# Patient Record
Sex: Male | Born: 1935 | Race: Black or African American | Hispanic: No | Marital: Married | Smoking: Former smoker
Health system: Southern US, Community
[De-identification: ages and names within clinical notes are randomized; demographics above are authoritative.]

## PROBLEM LIST (undated history)

## (undated) ENCOUNTER — Emergency Department (HOSPITAL_COMMUNITY): Admission: EM | Payer: PRIVATE HEALTH INSURANCE | Source: Home / Self Care

## (undated) DIAGNOSIS — R911 Solitary pulmonary nodule: Secondary | ICD-10-CM

## (undated) DIAGNOSIS — M5030 Other cervical disc degeneration, unspecified cervical region: Secondary | ICD-10-CM

## (undated) DIAGNOSIS — K922 Gastrointestinal hemorrhage, unspecified: Secondary | ICD-10-CM

## (undated) DIAGNOSIS — K5520 Angiodysplasia of colon without hemorrhage: Secondary | ICD-10-CM

## (undated) DIAGNOSIS — F419 Anxiety disorder, unspecified: Secondary | ICD-10-CM

## (undated) DIAGNOSIS — S2231XA Fracture of one rib, right side, initial encounter for closed fracture: Secondary | ICD-10-CM

## (undated) DIAGNOSIS — F1010 Alcohol abuse, uncomplicated: Secondary | ICD-10-CM

## (undated) DIAGNOSIS — D649 Anemia, unspecified: Secondary | ICD-10-CM

## (undated) DIAGNOSIS — J449 Chronic obstructive pulmonary disease, unspecified: Secondary | ICD-10-CM

## (undated) DIAGNOSIS — R51 Headache: Secondary | ICD-10-CM

## (undated) DIAGNOSIS — I10 Essential (primary) hypertension: Secondary | ICD-10-CM

## (undated) DIAGNOSIS — R519 Headache, unspecified: Secondary | ICD-10-CM

## (undated) HISTORY — DX: Anemia, unspecified: D64.9

## (undated) HISTORY — DX: Gastrointestinal hemorrhage, unspecified: K92.2

## (undated) HISTORY — DX: Solitary pulmonary nodule: R91.1

---

## 2001-10-15 ENCOUNTER — Encounter: Payer: Self-pay | Admitting: Internal Medicine

## 2001-10-15 ENCOUNTER — Inpatient Hospital Stay (HOSPITAL_COMMUNITY): Admission: EM | Admit: 2001-10-15 | Discharge: 2001-10-16 | Payer: Self-pay | Admitting: Emergency Medicine

## 2002-03-28 ENCOUNTER — Emergency Department (HOSPITAL_COMMUNITY): Admission: EM | Admit: 2002-03-28 | Discharge: 2002-03-28 | Payer: Self-pay | Admitting: Emergency Medicine

## 2002-03-28 ENCOUNTER — Encounter: Payer: Self-pay | Admitting: Emergency Medicine

## 2002-12-02 ENCOUNTER — Inpatient Hospital Stay (HOSPITAL_COMMUNITY): Admission: AD | Admit: 2002-12-02 | Discharge: 2002-12-05 | Payer: Self-pay | Admitting: General Surgery

## 2002-12-02 ENCOUNTER — Encounter: Payer: Self-pay | Admitting: General Surgery

## 2002-12-04 ENCOUNTER — Encounter: Payer: Self-pay | Admitting: General Surgery

## 2003-05-10 ENCOUNTER — Encounter: Payer: Self-pay | Admitting: Emergency Medicine

## 2003-05-10 ENCOUNTER — Emergency Department (HOSPITAL_COMMUNITY): Admission: EM | Admit: 2003-05-10 | Discharge: 2003-05-10 | Payer: Self-pay | Admitting: Emergency Medicine

## 2003-05-11 ENCOUNTER — Emergency Department (HOSPITAL_COMMUNITY): Admission: EM | Admit: 2003-05-11 | Discharge: 2003-05-11 | Payer: Self-pay | Admitting: Internal Medicine

## 2003-06-10 ENCOUNTER — Emergency Department (HOSPITAL_COMMUNITY): Admission: EM | Admit: 2003-06-10 | Discharge: 2003-06-10 | Payer: Self-pay | Admitting: Emergency Medicine

## 2003-06-10 ENCOUNTER — Encounter: Payer: Self-pay | Admitting: Emergency Medicine

## 2003-06-25 ENCOUNTER — Encounter (HOSPITAL_COMMUNITY): Admission: RE | Admit: 2003-06-25 | Discharge: 2003-07-25 | Payer: Self-pay | Admitting: General Surgery

## 2003-12-23 ENCOUNTER — Ambulatory Visit (HOSPITAL_COMMUNITY): Admission: RE | Admit: 2003-12-23 | Discharge: 2003-12-23 | Payer: Self-pay | Admitting: Internal Medicine

## 2004-01-21 ENCOUNTER — Inpatient Hospital Stay (HOSPITAL_COMMUNITY): Admission: EM | Admit: 2004-01-21 | Discharge: 2004-01-23 | Payer: Self-pay | Admitting: Emergency Medicine

## 2004-07-08 ENCOUNTER — Emergency Department (HOSPITAL_COMMUNITY): Admission: EM | Admit: 2004-07-08 | Discharge: 2004-07-08 | Payer: Self-pay

## 2005-07-19 ENCOUNTER — Emergency Department (HOSPITAL_COMMUNITY): Admission: EM | Admit: 2005-07-19 | Discharge: 2005-07-19 | Payer: Self-pay | Admitting: Emergency Medicine

## 2006-07-24 ENCOUNTER — Emergency Department (HOSPITAL_COMMUNITY): Admission: EM | Admit: 2006-07-24 | Discharge: 2006-07-24 | Payer: Self-pay | Admitting: Emergency Medicine

## 2006-10-17 ENCOUNTER — Emergency Department (HOSPITAL_COMMUNITY): Admission: EM | Admit: 2006-10-17 | Discharge: 2006-10-17 | Payer: Self-pay | Admitting: Emergency Medicine

## 2007-02-25 ENCOUNTER — Emergency Department (HOSPITAL_COMMUNITY): Admission: EM | Admit: 2007-02-25 | Discharge: 2007-02-25 | Payer: Self-pay | Admitting: Emergency Medicine

## 2007-11-04 ENCOUNTER — Emergency Department (HOSPITAL_COMMUNITY): Admission: EM | Admit: 2007-11-04 | Discharge: 2007-11-04 | Payer: Self-pay | Admitting: Emergency Medicine

## 2007-11-08 ENCOUNTER — Inpatient Hospital Stay (HOSPITAL_COMMUNITY): Admission: EM | Admit: 2007-11-08 | Discharge: 2007-11-09 | Payer: Self-pay | Admitting: Emergency Medicine

## 2007-11-09 ENCOUNTER — Ambulatory Visit: Payer: Self-pay | Admitting: Gastroenterology

## 2007-11-22 ENCOUNTER — Emergency Department (HOSPITAL_COMMUNITY): Admission: EM | Admit: 2007-11-22 | Discharge: 2007-11-22 | Payer: Self-pay | Admitting: Emergency Medicine

## 2008-04-17 ENCOUNTER — Ambulatory Visit (HOSPITAL_COMMUNITY): Payer: Self-pay | Admitting: Pulmonary Disease

## 2008-04-17 ENCOUNTER — Encounter (HOSPITAL_COMMUNITY): Admission: RE | Admit: 2008-04-17 | Discharge: 2008-05-17 | Payer: Self-pay | Admitting: Oncology

## 2008-06-14 ENCOUNTER — Emergency Department (HOSPITAL_COMMUNITY): Admission: EM | Admit: 2008-06-14 | Discharge: 2008-06-14 | Payer: Self-pay | Admitting: Emergency Medicine

## 2008-06-26 ENCOUNTER — Ambulatory Visit: Payer: Self-pay | Admitting: Gastroenterology

## 2008-07-01 HISTORY — PX: ESOPHAGOGASTRODUODENOSCOPY: SHX1529

## 2008-07-01 HISTORY — PX: COLONOSCOPY: SHX174

## 2008-07-04 ENCOUNTER — Ambulatory Visit (HOSPITAL_COMMUNITY): Admission: RE | Admit: 2008-07-04 | Discharge: 2008-07-04 | Payer: Self-pay | Admitting: Gastroenterology

## 2008-07-04 ENCOUNTER — Encounter: Payer: Self-pay | Admitting: Gastroenterology

## 2008-07-04 ENCOUNTER — Ambulatory Visit: Payer: Self-pay | Admitting: Gastroenterology

## 2008-08-11 ENCOUNTER — Ambulatory Visit (HOSPITAL_COMMUNITY): Payer: Self-pay | Admitting: Pulmonary Disease

## 2008-08-11 ENCOUNTER — Encounter (HOSPITAL_COMMUNITY): Admission: RE | Admit: 2008-08-11 | Discharge: 2008-09-10 | Payer: Self-pay | Admitting: Oncology

## 2008-09-18 ENCOUNTER — Ambulatory Visit: Payer: Self-pay | Admitting: Gastroenterology

## 2010-01-05 ENCOUNTER — Encounter (HOSPITAL_COMMUNITY): Admission: RE | Admit: 2010-01-05 | Discharge: 2010-02-04 | Payer: Self-pay | Admitting: Pulmonary Disease

## 2010-01-05 ENCOUNTER — Ambulatory Visit (HOSPITAL_COMMUNITY): Payer: Self-pay | Admitting: Pulmonary Disease

## 2010-03-12 ENCOUNTER — Emergency Department (HOSPITAL_COMMUNITY): Admission: EM | Admit: 2010-03-12 | Discharge: 2010-03-12 | Payer: Self-pay | Admitting: Emergency Medicine

## 2010-04-02 ENCOUNTER — Emergency Department (HOSPITAL_COMMUNITY): Admission: EM | Admit: 2010-04-02 | Discharge: 2010-04-03 | Payer: Self-pay | Admitting: Emergency Medicine

## 2010-05-16 ENCOUNTER — Emergency Department (HOSPITAL_COMMUNITY): Admission: EM | Admit: 2010-05-16 | Discharge: 2010-05-16 | Payer: Self-pay | Admitting: Emergency Medicine

## 2010-09-05 ENCOUNTER — Inpatient Hospital Stay (HOSPITAL_COMMUNITY): Admission: EM | Admit: 2010-09-05 | Discharge: 2010-09-05 | Payer: Self-pay | Admitting: Emergency Medicine

## 2010-10-10 ENCOUNTER — Emergency Department (HOSPITAL_COMMUNITY)
Admission: EM | Admit: 2010-10-10 | Discharge: 2010-10-10 | Payer: Self-pay | Source: Home / Self Care | Admitting: Emergency Medicine

## 2011-01-11 LAB — BASIC METABOLIC PANEL
BUN: 8 mg/dL (ref 6–23)
CO2: 25 mEq/L (ref 19–32)
Calcium: 8.5 mg/dL (ref 8.4–10.5)
Chloride: 96 mEq/L (ref 96–112)
GFR calc Af Amer: >60 mL/min (ref >60)
GFR calc non Af Amer: >60 mL/min (ref >60)
Potassium: 3.5 mEq/L (ref 3.5–5.1)
Sodium: 137 mEq/L (ref 135–145)

## 2011-01-11 LAB — DIFFERENTIAL
Band Neutrophils: 0 % (ref 0–10)
Basophils Absolute: 0 10*3/uL (ref 0.0–0.1)
Basophils Relative: 1 % (ref 0–1)
Eosinophils Absolute: 0 10*3/uL (ref 0.0–0.7)
Lymphs Abs: 0.6 10*3/uL — ABNORMAL LOW (ref 0.7–4.0)
Metamyelocytes Relative: 0 %
Monocytes Absolute: 0.6 10*3/uL (ref 0.1–1.0)
Myelocytes: 0 %
nRBC: 0 /100 WBC

## 2011-01-11 LAB — HEMOGLOBIN AND HEMATOCRIT, BLOOD
HCT: 28.5 % — ABNORMAL LOW (ref 39.0–52.0)
Hemoglobin: 8.9 g/dL — ABNORMAL LOW (ref 13.0–17.0)
Hemoglobin: 9.1 g/dL — ABNORMAL LOW (ref 13.0–17.0)

## 2011-01-11 LAB — CBC
HCT: 28.4 % — ABNORMAL LOW (ref 39.0–52.0)
Hemoglobin: 9 g/dL — ABNORMAL LOW (ref 13.0–17.0)
Platelets: 470 10*3/uL — ABNORMAL HIGH (ref 150–400)
WBC: 3.8 10*3/uL — ABNORMAL LOW (ref 4.0–10.5)

## 2011-01-18 LAB — CBC
Hemoglobin: 9.2 g/dL — ABNORMAL LOW (ref 13.0–17.0)
MCHC: 32.1 g/dL (ref 30.0–36.0)
Platelets: 424 10*3/uL — ABNORMAL HIGH (ref 150–400)

## 2011-01-18 LAB — URINALYSIS, ROUTINE W REFLEX MICROSCOPIC
Protein, ur: NEGATIVE mg/dL
pH: 5.5 (ref 5.0–8.0)

## 2011-01-18 LAB — BASIC METABOLIC PANEL
BUN: 9 mg/dL (ref 6–23)
CO2: 26 mEq/L (ref 19–32)
Calcium: 9.8 mg/dL (ref 8.4–10.5)
Creatinine, Ser: 0.91 mg/dL (ref 0.4–1.5)
GFR calc Af Amer: >60 mL/min (ref >60)
Sodium: 139 mEq/L (ref 135–145)

## 2011-01-23 ENCOUNTER — Emergency Department (HOSPITAL_COMMUNITY): Payer: Medicare Other

## 2011-01-23 ENCOUNTER — Emergency Department (HOSPITAL_COMMUNITY)
Admission: EM | Admit: 2011-01-23 | Discharge: 2011-01-23 | Disposition: A | Discharge status: Home / Self Care | Payer: Medicare Other | Attending: Emergency Medicine | Admitting: Emergency Medicine

## 2011-01-23 DIAGNOSIS — D649 Anemia, unspecified: Secondary | ICD-10-CM | POA: Insufficient documentation

## 2011-01-23 DIAGNOSIS — R000 Tachycardia, unspecified: Secondary | ICD-10-CM | POA: Insufficient documentation

## 2011-01-23 LAB — BASIC METABOLIC PANEL
BUN: 12 mg/dL (ref 6–23)
CO2: 26 mEq/L (ref 19–32)
Calcium: 9.4 mg/dL (ref 8.4–10.5)
Chloride: 100 mEq/L (ref 96–112)
Creatinine, Ser: 1.04 mg/dL (ref 0.4–1.5)
Glucose, Bld: 103 mg/dL — ABNORMAL HIGH (ref 70–99)
Potassium: 3.6 mEq/L (ref 3.5–5.1)

## 2011-01-23 LAB — DIFFERENTIAL
Eosinophils Absolute: 0.1 10*3/uL (ref 0.0–0.7)
Lymphs Abs: 0.6 10*3/uL — ABNORMAL LOW (ref 0.7–4.0)
Neutro Abs: 2 10*3/uL (ref 1.7–7.7)
Neutrophils Relative %: 60 % (ref 43–77)

## 2011-01-23 LAB — CBC
Hemoglobin: 8.7 g/dL — ABNORMAL LOW (ref 13.0–17.0)
MCH: 21.6 pg — ABNORMAL LOW (ref 26.0–34.0)
MCHC: 29.6 g/dL — ABNORMAL LOW (ref 30.0–36.0)
Platelets: 339 10*3/uL (ref 150–400)
RDW: 18.7 % — ABNORMAL HIGH (ref 11.5–15.5)

## 2011-01-23 LAB — HEMOGLOBIN AND HEMATOCRIT, BLOOD
HCT: 19.2 % — ABNORMAL LOW (ref 39.0–52.0)
HCT: 30 % — ABNORMAL LOW (ref 39.0–52.0)

## 2011-01-23 LAB — CROSSMATCH
ABO/RH(D): O POS
Antibody Screen: NEGATIVE

## 2011-01-23 LAB — POCT CARDIAC MARKERS
CKMB, poc: 5 ng/mL (ref 1.0–8.0)
Myoglobin, poc: 223 ng/mL (ref 12–200)
Troponin i, poc: <0.05 ng/mL (ref 0.00–0.09)
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
## 2011-03-15 NOTE — Consult Note (Signed)
NAME:  [name], [name]              ACCOUNT NO.:  [id number]   MEDICAL RECORD NO.:  [id number]          PATIENT TYPE:  AMB   LOCATION:  DAY                           FACILITY:  [facility]   PHYSICIAN:  Kassie Mends, M.D.      DATE OF BIRTH:  12-05-1929   DATE OF CONSULTATION:  06/27/2008  DATE OF DISCHARGE:                                 CONSULTATION   DATE OF SERVICE:  June 27, 2008.   PROBLEM LIST:  1. Iron deficiency anemia with a hemoglobin of 3.5 in January 2009.      The patient left against medical advice.  2. History of alcohol abuse.  3. Schizoaffective disorder.  4. Hypertension.  5. Headaches.  6. Anxiety  7. History of gastrointestinal bleed in 2004 and 2005 with      esophagogastroduodenoscopy and colonoscopy finding no lesion to      explain his anemia.  He also had a capsule endoscopy, which      suggested angiodysplasia.   SUBJECTIVE:  [name] is a 77 year old male who was last seen in  January 2009.  He says he has quit drinking liquor and he say he stopped  drinking beer a while back.  He was drinking alcohol to help him sleep.  He is now taking Xanax to help him sleep.  He says he is taking an iron  pill.  He denies any bright red blood per rectum or problem of  swallowing.  He is complaining of itching in his rectal area.  He also  complained of rash on the left chest.   ALLERGIES:  No known drug allergies.   MEDICATIONS:  1. Xanax 0.5 mg t.i.d.  2. Ventolin inhaler  3. Iron.   OBJECTIVE:   PHYSICAL EXAMINATION:  VITAL SIGNS:  Weight 134 pounds (up 17-1/2 pound  since January 2009), height 5 feet 4 inches, BMI 23.2 (healthy),  temperature 97.8, blood pressure 102/68, and pulse 84.GENERAL:  He is in  no apparent distress.  Alert and oriented x4.HEENT:  Atraumatic and  normocephalic.  Pupils are equal and reactive to light.  Mouth and oral  lesions.  Posterior pharynx without erythema or exudate.NECK:  Full  range of motion.  No lymphadenopathy.LUNGS:   Clear to auscultation  bilaterally.CARDIOVASCULAR:  Regular rhythm.  No murmur.  Normal S1 and  S2.ABDOMEN:  Bowel sounds are present, soft, nontender, nondistended.  No rebound or guarding.EXTREMITIES:  No cyanosis or edema.NEURO:  Has no  focal neurologic deficits.SKIN:  He has a macular rashes hyperpigmented  on his left chest.   ASSESSMENT:  [name] is a 77 year old male with iron deficiency  anemia.  His ferritin was measured at 6 in June 2009.  He also had a  hemoglobin of 6.2 in June 2009.  The differential diagnosis includes,  small-bowel arteriovenous malformations, and a low likelihood of gastric  malignancy, or colorectal cancer.  His rash is likely a fungal  infection.  He also has probable rectal itching from hemorrhoids.  Thank  you for allowing me to see [name] in consultation.  My  recommendations are as follow.  RECOMMENDATIONS:  1. He is given a discharge instruction in writing for the management      of his rectal itching, rash on his chest and his anemia.  He is to      begin Proctocort Cream to his rectum 4 times a day for 10 days.  He      should avoid hard stools.  2. I suggest miconazole with counter 4 times a day q.h.s. for 14 days.      If his rashes not resolved, he should see Dr. Juanetta Gosling.  3. He will have a colonoscopy followed by an EGD in next week.  4. Followup visit to see me in 3 months.      Kassie Mends, M.D.  Electronically Signed     SM/MEDQ  D:  06/27/2008  T:  06/28/2008  Job:  [id number]   cc:   Ramon Dredge L. Juanetta Gosling, M.D.  Fax: [phone]
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
## 2011-03-15 NOTE — Op Note (Signed)
NAME:  [name], [name]              ACCOUNT NO.:  [id number]   MEDICAL RECORD NO.:  [id number]          PATIENT TYPE:  AMB   LOCATION:  DAY                           FACILITY:  [facility]   PHYSICIAN:  Kassie Mends, M.D.      DATE OF BIRTH:  Dec 08, 1929   DATE OF PROCEDURE:  07/04/2008  DATE OF DISCHARGE:                               OPERATIVE REPORT   REFERRING PHYSICIAN:  Edward L. Juanetta Gosling, MD   PROCEDURES:  1. Ileocolonoscopy with BICAP of ascending colon arteriovenous      malformations.  2. Esophagogastroduodenoscopy with cold forceps biopsy and BICAP to      gastric arteriovenous malformation.   INDICATION FOR EXAM:  Mr. Gartin is a 77 year old male who presents  with persistent microcytic anemia.  He has a history of taking Xanax and  alcohol use.   FINDINGS:  1. Few small AVMs seen in the ascending colon just distal to the IC      valve.  Both lesions ablated via BICAP (25 watts).  No active      bleeding was noted prior to or after the procedure.  Otherwise, no      polyps, masses, inflammatory changes, diverticular AVMs, or      diverticula seen.  2. Normal terminal ileum approximately 10 cm visualized.  3. Normal esophagus without evidence of Barrett mass, erosion,      ulceration, or stricture.  4. Small hiatal hernia.  A 1-2 mm AVM active oozing in the mid body of      the stomach.  The lesion was ablated with BICAP (25 watts).  5. Patchy erosions in the antrum.  Biopsies obtained via cold forceps      to evaluate for H. pylori gastritis.  6. Normal duodenal bulb and second portion of the duodenum.   DIAGNOSES:  1. Ascending colon arteriovenous malformations.  2. Small hemorrhoids.  3. Hiatal hernia.  4. Gastric arteriovenous malformation.  5. Mild gastritis.   RECOMMENDATIONS:  1. He should avoid alcohol.  No aspirin or anti-inflammatory drugs for      30 days.  No anticoagulation for 7 days.  2. He should follow a high-fiber diet.  He was given a handout on    hemorrhoids, gastritis, and a high-fiber diet.  3. He should continue his iron for at least 3 months.  4. He has a follow up appointment to see Dr. Cira Servant in 3 months      regarding his anemia.  5. Will assess the need for a PPI.   MEDICATIONS:  1. Demerol 50 mg IV.  2. Versed 4 mg IV.   PROCEDURE TECHNIQUE:  Physical exam was performed.  Informed consent was  obtained from the patient after explaining the benefits, risks, and  alternatives to the procedure.  The patient was connected to the monitor  and placed in a left lateral position.  Continuous oxygen was provided  by nasal cannula and IV medicine administered through an indwelling  cannula.  After administration of sedation and rectal exam, the  patient's rectum was intubated and the scope was advanced under direct  visualization to the distal terminal ileum.  The scope was removed  slowly by carefully examining the color, texture, anatomy, and integrity  of the mucosa on the way out.   After the colonoscopy, the patient's esophagus was intubated with a  diagnostic gastroscope and the scope was advanced under direct  visualization to the second portion of the duodenum.  The scope was  removed slowly by carefully examining the color, texture, anatomy, and  integrity of the mucosa on the way out.  The patient was recovered in  endoscopy and discharged home in satisfactory condition.   PATH:  Benign mucosa.      Kassie Mends, M.D.  Electronically Signed     SM/MEDQ  D:  07/04/2008  T:  07/05/2008  Job:  [id number]   cc:   Ramon Dredge L. Juanetta Gosling, M.D.  Fax: [phone]

## 2011-03-15 NOTE — Assessment & Plan Note (Signed)
NAMEKIMMY, [name]                 CHART#:  [id number]   DATE:  09/18/2008                       DOB:  12-31-1929   PROBLEM LIST:  1. Ascending colon AVMs and gastric AVMs ablated in September 2009.  2. Alcohol abuse.  3. Hiatal hernia.  4. Anxiety.   SUBJECTIVE:  [name] is a 77 year old male who presents as a return  patient visit.  He was seen in September 2009.  He was seen during his  endoscopy.  He was asked to continue iron and follow up with me in  regard to his anemia.  Since September 2009, he has had a hemoglobin of  6.6 recorded in October.  He received blood transfusions.  He was asked  to follow with me in regard to subsequent management for his GI disease.  He denies any bright red blood per rectum, vomiting blood, or black  stool that looks like tar.  He has had no nausea or vomiting.  He  stopped drinking liquor, but is using [employer] at nighttime to  help him sleep.  He states it works better than Xanax.  He also was  complaining of feeling shortness of breath when he walks.  He believes  his shortness of breath is related to his lungs primarily.   MEDICATIONS:  None.   OBJECTIVE:  VITAL SIGNS:  Weight 135 pounds, height 5 feet 4 inches,  temperature 97.4, blood pressure 100/68, and pulse 60.  GENERAL:  He is in no apparent distress.  Alert and interactive.  He  answers questions appropriately.  He does have limited understanding of  the management of his disease.  LUNGS:  Clear to auscultation bilaterally.  However, if he exhales you  can here audible end expiratory wheezes.  I did not appreciate those on  pulmonary exam.  CARDIOVASCULAR:  Regular rhythm.  No murmur.  ABDOMEN:  Bowel sounds are present, soft, nontender, and nondistended.  NEURO:  He has no focal or neurologic deficits.   ASSESSMENT:  [name] is a 77 year old male who has a transfusion-  dependent anemia likely secondary to gastrointestinal arteriovenous  malformations.   Thank you for allowing me to see [name] in  consultation.  My recommendations follow.   RECOMMENDATIONS:  1. I explained management options to [name] which include      continue oral iron, capsule endoscopy and in 3 months if he is      still requiring transfusions then hematology referral for      intravenous infusions, or continue oral therapy and follow up in 2      months and if he is still requiring transfusions, then capsule      endoscopy and hematology referral.  [name] has made up his      mind that he does not want any blood transfusions.  I tried to      explain to him that, that may mean that he could die if he does not      have enough blood in his system.  He also declined to have his      blood drawn today to see if his hemoglobin was back down to 6.6 as      a reason for his shortness of breath.  He declined to have capsule  endoscopy today.  2. He did agree to oral iron therapy and was given bifera samples (5      days supply) who is also asked to take Nu-Iron 150 mg twice a day.  3. He has a follow up appointment to see me in 2 months'.  I did      discuss the challenges of managing his disease with Dr. Juanetta Gosling.      We will work together to try to ensure that [name] remains on      oral iron.  [name] seemed to understand that taking oral iron      may allow him to avoid blood transfusions.       Kassie [name], M.D.  Electronically Signed     SM/MEDQ  D:  09/18/2008  T:  09/19/2008  Job:  [id number]   cc:   Ramon Dredge L. Juanetta Gosling, M.D.
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
## 2011-03-18 NOTE — Discharge Summary (Signed)
NAME:  [name], [name]                          ACCOUNT NO.:  [id number]   MEDICAL RECORD NO.:  [id number]                   PATIENT TYPE:  INP   LOCATION:  A220                                 FACILITY:  [facility]   PHYSICIAN:  Jerolyn Shin C. Katrinka Blazing, M.D.                DATE OF BIRTH:  30-Jul-1930   DATE OF ADMISSION:  12/02/2002  DATE OF DISCHARGE:  12/05/2002                                 DISCHARGE SUMMARY   DISCHARGE DIAGNOSES:  1. Gastrointestinal bleeding of uncertain etiology, treated, improved.  2. Severe anemia, stable, status post transfusion.  3. Acute gastritis of antrum and body of stomach.  4. Small non-bleeding hemorrhoids.  5. Chronic alcoholism.  6. Schizoaffective disorder.  7. Hypertension.  8. Chronic headaches.   DISPOSITION:  The patient is discharged home in stable condition.   DISCHARGE MEDICATIONS:  1. Xanax 0.5 mg t.i.d.  2. Ultram 50 mg q.i.d.  3. Lexapro 10 mg daily.  4. Zyprexa 10 mg daily.  5. Hemocyte Plus one daily.   FOLLOWUP:  The patient is scheduled to be seen in the office in one week.   SPECIAL PROCEDURES:  1. Esophagogastroduodenoscopy with biopsy.  2. Colonoscopy.   These were done on December 03, 2002.   SUMMARY:  [age]-year-old male admitted with severe anemia.  The patient  had been seen in followup for recurrent GI bleeding.  Stool guaiacs were  positive and hemoglobin was 7.2.  He had a complaint of feeling tired and  weak and extremely cold; he also had significant dyspnea on exertion.  The  patient had to be convinced to be admitted to the hospital.  Over the past  year, he has had recurrent episodes of rectal bleeding and multiple episodes  of melena.  He has been on iron therapy.  He was admitted because of  asymptomatic anemia and need for transfusion and the need for endoscopic  evaluation.   He has history of heavy use of aspirin for chronic headaches.  There is a  long history of alcohol abuse.  He has a schizoaffective  disorder.  Other  specifics are given in the admission note.   His stool on exam was strongly guaiac-positive.  The patient was admitted.  Because of his hemoglobin of 7.2, it was felt that we needed to transfuse  him to three units but the pathologist would only release two units of  blood; this is because of a blood shortage.  Nevertheless, the patient was  transfused two units of packed red cells and his post-transfusion hemoglobin  was 9.9.  On December 03, 2001, he underwent EGD and colonoscopy.  EGD showed  acute antral gastritis with acute antral erosions.  A colonoscopy also  revealed small internal hemorrhoids which were not bleeding.  Gastric  biopsies were taken x3; these only showed gastritis.  The patient did well  otherwise.  We elected to do a small-bowel follow-through  since upper and  lower endoscopies were normal.  Small-bowel follow-through was negative.  The patient had documented bradycardia during this hospitalization with  heart rate in a sinus pattern, varying between 48 and 52.  It was felt that  we did not need to address it at this time.   DICTATION ENDED AT THIS POINT.                                               Dirk Dress. Katrinka Blazing, M.D.    LCS/MEDQ  D:  12/16/2002  T:  12/16/2002  Job:  [id number]

## 2011-03-18 NOTE — Discharge Summary (Signed)
NAME:  [name], [name]                          ACCOUNT NO.:  [id number]   MEDICAL RECORD NO.:  [id number]                   PATIENT TYPE:  INP   LOCATION:  A220                                 FACILITY:  [facility]   PHYSICIAN:  Jerolyn Shin C. Katrinka Blazing, M.D.                DATE OF BIRTH:  June 14, 1930   DATE OF ADMISSION:  12/02/2002  DATE OF DISCHARGE:  12/05/2002                                 DISCHARGE SUMMARY   This is a redictation.   DISCHARGE DIAGNOSES:  1. Recurrent gastrointestinal bleeding, etiology undetermined.  2. Severe anemia secondary to number one.  3. Acute and chronic gastritis of antrum and body.  4. Small internal hemorrhoids.  5. Chronic alcoholism.  6. Schizoaffective disorder.  7. Hypertension.  8. Chronic headaches.  9. Asymptomatic sinus bradycardia.   SPECIAL PROCEDURE:  EGD with biopsy and colonoscopy done on February 3.   DISPOSITION:  The patient discharged home in stable, satisfactory condition  with plans for follow-up in the office in one week.   DISCHARGE MEDICATIONS:  1. Xanax 0.5 mg t.i.d.  2. Ultram 50 mg q.i.d.  3. Lexapro 10 mg daily.  4. Zyprexa 10 mg daily.  5. Hemocyte Plus one daily.   FOLLOW UP:  The patient is scheduled to be seen in the office in one week.   SUMMARY:  A 77 year old male with a history of severe anemia who has been  followed for recurrent GI bleeding.  The patient was noted to have heme-  positive stools and hemoglobin of 7.2.  He complained of feeling weak and  tired and was extremely cold.  He also had dyspnea on exertion.  He was  finally convinced that he needed to be admitted to the hospital.  The  patient has had multiple episodes of recurrent rectal bleeding in the past  and his hemoglobin has fluctuated from 8.2 to 11.  He had been on iron  therapy.   PAST MEDICAL HISTORY:  Past history reveals that he has a history of heavy  aspirin use for chronic headaches.  He also has schizoaffective disorder.   PHYSICAL  EXAMINATION:  RECTAL:  Unremarkable except for guaiac-positive  stool on rectal examination.   HOSPITAL COURSE:  The patient was admitted, typed and crossed, transfused 2  units of packed cells.  We wished to transfuse him 3 units but the  pathologist refused _______ of blood because of blood shortage.  Post  transfusion hemoglobin 9.9.  The patient underwent a bowel prep the evening  of admission and on the following morning he had EGD with biopsy and  colonoscopy.  This showed acute antral gastritis with erosions and small  internal hemorrhoids.  The patient tolerated procedure well.  Small bowel  follow through was done and this was negative.  The patient had bradycardia  which was noted during this hospitalization with heart rate about 48-52.  He  was asymptomatic  so this was not addressed any further.  He will be followed  closely as an outpatient.  The patient did very well and was discharged home  in satisfactory condition.                                               Dirk Dress. Katrinka Blazing, M.D.    LCS/MEDQ  D:  12/16/2002  T:  12/16/2002  Job:  [id number]

## 2011-03-18 NOTE — H&P (Signed)
NAME:  [name], [name]                          ACCOUNT NO.:  [id number]   MEDICAL RECORD NO.:  [id number]                   PATIENT TYPE:  INP   LOCATION:  A220                                 FACILITY:  [facility]   PHYSICIAN:  Jerolyn Shin C. Katrinka Blazing, M.D.                DATE OF BIRTH:  Dec 30, 1929   DATE OF ADMISSION:  12/02/2002  DATE OF DISCHARGE:                                HISTORY & PHYSICAL   HISTORY OF PRESENT ILLNESS:  A 77 year old male admitted with severe anemia.  The patient was seen in the office on January 29. He was seen in follow-up  of recurrent GI bleeding. He denied having any bleeding at that time but  states that he had some dark stools. A stool guaiac was positive and  hemoglobin was done. Hemoglobin was 7.2. The patient states that he has been  feeling tired and weak and that he has been extremely cold. He has had  significant exertion of dyspnea. He was finally convinced to be admitted to  the hospital. He has had two hospitalization in the past for anemia but we  have never been able to do diagnostic studies. His last hospitalization was  December 2002. At that time, he had a hemoglobin of 7. He was in the process  of having a bowel prep when he left the hospital AMA. He had been transfused  up to a hemoglobin of 12. Over the past year, he has had recurrent episodes  of rectal bleeding, multiple episodes of melena and a hemoglobin which has  fluctuated from 8.2 to 11. He has been on iron therapy but has not taken the  iron faithfully. The patient is admitted now because of symptomatic anemia.  Will try to transfuse him tonight and start his bowel prep with Dulcolax  tablets tonight and do an upper and lower endoscopy in the morning.   PAST MEDICAL HISTORY:  The patient has a history of heavy aspirin use for  headaches. He has had chronic headaches for many years. He also has a  history of alcohol abuse. Other problems include a schizo effective disorder  that has been  fairly well controlled on Lexapro and Zyprexa. He has been  compliant in taking his medications and has not had an acute exacerbation of  his personality disorder.   MEDICATIONS:  1. Xanax 0.5 mg t.i.d.  2. Ultram 50 mg q.i.d. for headaches.  3. Lexapro 10 mg daily.  4. Zyprexa 10 mg daily.   PAST SURGICAL HISTORY:  None given.   SOCIAL HISTORY:  He is retired, has a history of tobacco use and alcohol  abuse. Presently he does not smoke but he does chew tobacco.   PHYSICAL EXAMINATION:  GENERAL:  He is a small framed male who is quite  alert. He looks pale.  VITAL SIGNS:  Blood pressure 150/60, pulse 68, respirations 20, weight 149  pounds.  HEENT:  Unremarkable except that he is edentulous.  NECK:  Supple, no JVD or bruit.  CHEST:  Clear to auscultation, no rales, rubs, rhonchi or wheezes.  HEART:  Regular rate and rhythm without murmur, gallop or rub.  ABDOMEN:  Soft, nontender, no masses.  RECTAL:  No masses, stools strongly guaiac positive.  EXTREMITIES:  No cyanosis, clubbing or edema. No joint deformity.  NEUROLOGIC:  Alert and oriented x3. Cranial nerves II-XII are  intact. Gait  and stance are normal. There is no motor, sensory or cerebellar deficit.   IMPRESSION:  1. Recurrent gastrointestinal bleeding with severe anemia.  2. Chronic alcoholism.  3. Schizo effective disorder.  4. Hypertension.  5. Chronic headaches.  6. Heavy alcohol use.  7. Heavy aspirin abuse.   PLAN:  The patient will have stat evaluation of his hemoglobin aneurysm he  will be typed and crossed and transfused 2-3 units of packed red cells as  needed. Will start on his bowel prep tonight and he will have upper and  lower endoscopy in the morning. He will be started on Librium  prophylactically because of his history of alcohol abuse and he will be  continued on his baseline medications.                                               Dirk Dress. Katrinka Blazing, M.D.    LCS/MEDQ  D:  12/02/2002  T:   12/02/2002  Job:  [id number]

## 2011-03-18 NOTE — H&P (Signed)
NAMEJOSEL, KEO NO.:  [id number]   MEDICAL RECORD NO.:  [id number]                   PATIENT TYPE:  INP   LOCATION:  IC08                                 FACILITY:  [facility]   PHYSICIAN:  Annia Friendly. Loleta Chance, M.D.                DATE OF BIRTH:  08-14-1930   DATE OF ADMISSION:  01/20/2004  DATE OF DISCHARGE:                                HISTORY & PHYSICAL   The patient is a 77 year old black male who is separated and a retired Cabin crew. He resides in [place], [place].   CHIEF COMPLAINT:  Dizziness, nausea, vomiting and stomach pain.  The patient  has been experiencing symptoms over the past 24 hours.  The patient incurred  periumbilical pain on January 19, 2004.  The pain was intermittent and  described as an ache.  He incurred dizziness on the day of admission with  vomiting x3, the vomitus was described as black.  He denies syncope, chest  pain, shortness of breath, gross hematuria.   PAST MEDICAL HISTORY:  1. History of chronic gastritis by EGD.  2. Internal hemorrhoids.  3. Schizoaffective disorder.  4. Chronic anxiety.  5. The patient has been hospitalized several times pertaining GI blood loss.  6. Negative for known peptic ulcer disease, inflammatory bowel disease, or     colon cancer.  7. Negative for diabetes, tuberculosis, sickle cell, asthma, seizure     disorder.   MEDICATIONS:  1. Xanax 0.25 mg p.o. t.i.d.  2. Hydrocodone/APAP 5/ 500 p.o. q.i.d. p.r.n.  3. Temazepam 30 mg p.o. q.h.s. for sleep.  4. Iron supplement 65 mg p.o. daily.   ALLERGIES:  The patient is not allergic to any known medications.   HABITS:  Positive for current use of chewing tobacco. Former cigarette  smoker x3 years (smoked one pack every 3 days at time of smoking; started at  age 31).  The patient states that he has not consumed ethanol x2 months (at  time of use, ethanol 1 pint daily; started at age 51).  The patient denies  use of street drugs.   No history of sexually transmitted disease.  History  if negative for gonorrhea, syphilis, and human immunodeficiency virus  infection.   OTHER PAST MEDICAL HISTORY:  Positive for hospitalization for anemia  secondary to GI blood loss in February, 2004.  Hospitalization for chronic anemia secondary to blood loss with iron-  deficiency anemia in February, 2002.  The patient denies use of over-the-  counter nonsteroidal antiinflammatory agents.   REVIEW OF SYSTEMS:  Negative for epistaxis, bleeding gums, significant  weight loss, change in bowel habit, edema of legs, constipation, diarrhea,  hemoptysis, unexplained fever, dysphagia, etc.   FAMILY HISTORY:  Mother deceased secondary to natural causes; father  deceased secondary to suicide; 3 living sisters, one at 49 with history of  heart disease, 75 year old with history of polio, and an 75 year old  with  history of dementia; 2 sisters deceased secondary to complications of  stroke; son with a history of stroke; 3 living daughters, 1 age 37 with  history of asthma, age 43 with history of gastroesophageal reflux disease.   PHYSICAL EXAMINATION:  GENERAL:  Elderly, slightly small framed, slightly  short, alert black male in no apparent respiratory distress.  SKIN:  Warm and dry.  VITAL SIGNS:  At 1936 Hr. on January 20, 2004 temperature was 97.0, blood  pressure 82/65, pulse 103, respirations 24.  Vitals at 1948 Hr. on January 21, 2004:  Blood pressure 103/67, pulse 80, respirations 24.  HEENT:  Positive male pattern baldness.  Ears - external canal patent,  tympanic membranes pearly gray.  Eyes - lids negative with ptosis, sclerae  white, pupils round, equal and reactive to light.  Extraocular movements are  intact. Nose - negative discharge.  Mouth positive for upper and lower  dentures.  No oral lesions.  Posterior pharynx benign.  NECK:  Negative for lymphadenopathy or thyromegaly. Supraclavicular space  without palpable nodes.   LUNGS:  Clear.  HEART:  Audible S1 and S2 without murmur.  Regular rate and rhythm.  ABDOMEN:  No distention, hyperactive bowel sounds, soft, nontender in all 4  quadrants.  No palpable mass, no organomegaly.  EXTERNAL GENITALIA:  Normal male. No nodule on palpation of testicles.  RECTAL:  (By ER physician) was guaiac positive.  EXTREMITIES:  No edema, no joint swelling or joint redness, no joint  effusion.  NEUROLOGIC:  Alert and oriented to person, place and time.  Cranial nerves  II-XII appeared intact.   LABORATORY DATA:  White count 6.4 thousand, hemoglobin 8.7, hematocrit 28.2,  platelets 444,000.  Albumin 3.2, SGOT 38, SGPT 41, alkaline phosphatase 7,  total bilirubin 0.5, sodium 137, potassium 4.3, chloride 104, CO2 28,  glucose 128, BUN 39, creatinine 0.9, calcium 8.9, total CPK 66, CK-MB 1.4,  troponin-I of 0.01.   EKG demonstrated a normal sinus rhythm, no ST elevation or depression, Q  wave in lead V1 only.   PRIMARY DIAGNOSIS:  Anemia secondary to gastrointestinal blood loss.   SECONDARY DIAGNOSES:  1. Internal hemorrhoids.  2. Schizoaffective disorder.  3. History of chronic gastritis by esophagogastroduodenoscopy on December 03, 2002.  4. Chronic anxiety.   PLAN:  Intravenous normal saline at 100 cc/Hr.  Oxygen at 2 liters/minute  via nasal cannula.  Activity will be bed rest.  Bedside commode.  Xanax 0.25 mg p.o. b.i.d.,  Protonix 40 mg IV daily.  Case worker consult _____.  Type and cross for 2  units of packed red blood cells and transfuse for hematocrit less than 26.  Repeat CBC q.12h x2 then in 24 hours.  CEA level in a.m.     ___________________________________________                                         Annia Friendly. Loleta Chance, M.D.   Levonne Hubert  D:  01/21/2004  T:  01/21/2004  Job:  [id number]

## 2011-03-18 NOTE — Discharge Summary (Signed)
[name], [name] NO.:  [id number]   MEDICAL RECORD NO.:  [id number]                   PATIENT TYPE:  INP   LOCATION:  A213                                 FACILITY:  [facility]   PHYSICIAN:  Annia Friendly. Loleta Chance, M.D.                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.  Chief complaints  were dizziness, nausea, vomiting and stomach pain.  The patient had been  experiencing symptoms over the past 24 hours.  He also experienced  periumbilical pain on January 19, 2004.  The pain was intermittent and  described as an ache.  He vomited x3 on the day of admission.  Vomiting was  described as black.  He denied syncope, chest pain, shortness of breath or  gross hematuria.   PAST MEDICAL HISTORY:  Positive for chronic gastritis by EGD, internal  hemorrhoids, schizoaffective disorder, chronic anxiety.   PAST MEDICAL HISTORY:  Past hospitalization for GI blood loss.   PAST MEDICAL HISTORY:  Negative for known peptic ulcer disease, inflammatory  bowel disease and colon cancer.   ALLERGIES:  The patient is not allergic to any known medication.   SOCIAL HISTORY:  Habits were positive for current use of chewing tobacco,  former cigarette smoker x3 years, apparently smoked one pack every three  days at time of smoking (started at age 27).  Positive for former use of  alcohol x2 months (at time of use, ethanol, one pint daily; starting age  86).  The patient denies excessive use of over-the-counter antiinflammatory  agents.   PROBLEMS LIST:  Chronic anemia secondary to GI blood loss due to vascular  abnormality in the cecum and ascending colon.   GENERAL APPEARANCE:  GENERAL:  An elderly, slight small-framed, alert black  male, in no  apparent respiratory distress.  VITAL SIGNS:  At 1936 in the emergency room, temperature 97.0, blood  pressure 82/65, pulse 103.  Respirations 24.  HEENT:  Positive for male-pattern baldness.  Sclerae white.  Conjunctivae  pink.  LUNGS:  Clear.  HEART:  S1, S2, without murmur, regular rate and rhythm.  ABDOMEN:  Slightly obese, hyperactive bowel sounds, soft, nontender all four  quadrants.  RECTAL:  No external lesion.  Digital exam by ER physician was guaiac  positive.   LABORATORY DATA:  Significant labs on admission:  White count 6.4,  hemoglobin 8.7, hematocrit 20.2, platelets 444,000.   HOSPITAL COURSE:  The patient was admitted to telemetry.  IV fluids and  normal saline at 100 cc per hour, oxygen at two liters per minute via nasal  cannula.  Type and cross two units of packed red blood cells and keep her  whole and  transfuse if hematocrit less than 26.  Order repeat CBC every 12  hours x2.  Protonix 40 mg IV daily.  Activity bedrest.  RBC tagged scan.  Clear liquid diet.  Other supportive measures.  RBC scan on January 21, 2004,  was read as negative.  GI bleeding scan with no evidence of active site of  bleeding during the dynamic scan as ready by Dr. __________ .  The patient  was given two unit of packed red blood cells on January 21, 2004, because of  hemoglobin of 7.7, hematocrit 24.2.  Post transfusion hemoglobin was 9.6,  and hematocrit was 29.4.  The patient had a diagnostic colonoscopy of January 23, 2004, performed by Dr. Katrinka Blazing revealing vascular abnormality in the cecum  and the ascending colon.  There were multiple medium collections of small  vessels in the cecum and ascending colon which showed oozing in the visible  vessels.   Colonoscopy otherwise normal.  EGD on January 23, 2004, revealed the  following:  1. Hypopharynx appeared normal.  2. The esophagus appeared normal.  3. The gastroesophageal junction appeared normal.  4. Acute gastritis of the antrum, the body  of the stomach and the fundus.     Multiple random cold biopsies were obtained from the antrum and the body     of the stomach.  5. The pylorus appeared normal.  6. The duodenum appeared normal.  7. The patient's hemoglobin on June 24, 2004, at 0615 was 8.8, and     hematocrit 26.9.   PROBLEMS:  Problem #1.  Dr. Katrinka Blazing, surgeon, discussed with the patient and  his wife the need for removal of  rectalectomy to stop the chronic anemia.  The patient and his wife appeared to understand, and they were in agreement  for the procedure to be done by Dr. Elpidio Anis.  The patient left against  medical advice on January 23, 2004.  He was alert and oriented to person,  place and time at the time of leaving against medical advice.  He was not  complaining of shortness of breath, chest pain or dizziness.  An effort will  be made to contact the patient to come to the office for repeat hemoglobin  and hematocrit on January 26, 2004.  He will again be reminded to avoid  alcoholic beverages, aspirin like products and any agents that is irritating  to the stomach.   Problem #2.  Schizoaffective disorder.  The patient will be observed.   Problem #3.  Chronic anxiety.  The patient was treated with Xanax 0.25 mg  p.o. b.i.d.  He remained alert and oriented to person, place and time  throughout this hospitalization.  He denies ________ tactile or ________  hallucinations.   Last recorded systolics were greater than 90.   DISCHARGE INSTRUCTIONS:  None.   MEDICATIONS:  1. The patient was advised to continue Xanax 0.25 mg p.o. b.i.d.  2. Iron supplement, 65 mg p.o. daily.  3. Prilosec 20 mg p.o. daily.  4. Temazepam 50 mg p.o. q. bedtime for sleep.  5. Activity as tolerated.   PRIMARY DIAGNOSES:  1. Anemia secondary to lower gastrointestinal bleeding due to vascular     abnormality in the cecum and ascending colon.  2. Gastritis of the antrum, body of the stomach, and the fundus.  SECONDARY  DIAGNOSES:  1. Schizoaffective disorder.  2. Chronic anxiety.     ___________________________________________  Annia Friendly. Loleta Chance, M.D.   Levonne Hubert  D:  01/24/2004  T:  01/25/2004  Job:  [id number]

## 2011-03-18 NOTE — Discharge Summary (Signed)
[facility]  Patient:    [name], [name] Visit Number: [id number] MRN: [id number]          Service Type: MED Location: 2A A223 01 Attending Physician:  Elliot Cousin Dictated by:   Elliot Cousin, M.D. Admit Date:  10/15/2001 Discharge Date: 10/16/2001                             Discharge Summary  DISCHARGE DIAGNOSES:  1. PATIENT LEFT AGAINST MEDICAL ADVICE (AMA).  2. Iron deficiency anemia probably secondary to a subacute gastrointestinal     bleed.     a. Iron studies revealed a ferritin of 14, total iron of 122, vitamin B12        of 331, RBC folate pending.  3. Thrombocytosis secondary to anemia.  4. Agitation probably secondary to early alcohol withdrawal.  5. History of chronic alcoholism.  6. Hypertension.  7. History of personality disorder (per primary care physician, Dr. Katrinka Blazing).  8. Bradycardia.  9. Chronic anxiety. 10. Transient chest pain.  SECONDARY DISCHARGE DIAGNOSES: 1. History of chewing tobacco. 2. History of chest pain October 1999.  Cardiolite was negative.  DISCHARGE MEDICATIONS: 1. Norvasc 10 mg q.d. 2. Hemocyte Plus one q.d. 3. Xanax 0.5 mg b.i.d. p.r.n.  DISCHARGE DISPOSITION:  The patient became agitated and was complaining about not having anything to eat.  He therefore left against medical advice.  He apparently was picked up from the hospital by one of his sons.  He was in stable condition prior to hospital discharge.  He was alert and oriented and fully competent and aware of his decision.  The patient was advised to call the office to make an appointment to see Dr. Katrinka Blazing within the next week.  CONSULTATIONS:  Elpidio Anis, M.D.  PROCEDURES:  A 2-D echocardiogram.  The results revealed mild left atrial enlargement, ejection fraction 60%, no evidence of left ventricular hypertrophy, no important valve abnormalities, normal right-sided structures and function, no pericardial effusion.  HISTORY OF PRESENT  ILLNESS:  [name] is a 77 year old African-American man who presented to the emergency room the early morning of October 15, 2001 with a one day history of an inability to "belch," indigestion, nervous stomach, nervous legs, and transient left-sided chest pain which lasted for a few seconds, then resolved.  The patient says that the chest pain was not associated with diaphoresis, nausea, vomiting, radiation, or pleurisy.  He did have one episode of episodic dyspnea on exertion.  He said that he drank a beer the day before arriving to the emergency room to help him burp.  He said the beer succeeded.  The patient denies lightheadedness, visual changes, headache, fever, chills, nausea, vomiting, diarrhea, orthopnea, abdominal pain, hematochezia, dysuria, lower extremity swelling, and any upper respiratory infection symptoms.  REVIEW OF SYSTEMS:  Positive for black stool approximately two weeks ago.  The patient reports that he drinks alcohol approximately three to four times a week.  He uses BC Powder approximately five to seven nights a week, occasionally for chest pain and headache.  ADMISSION LABORATORIES:  WBC 2.8, hemoglobin 7.0, hematocrit 21.6, MCV 66.8, platelets 503,000.  Sodium on admission 138, potassium 3.5, chloride 104, CO2 29, glucose 79, BUN 8, creatinine 0.9, calcium 8.2, total protein 6.5, albumin 3.4, AST 25, ALT 11, alkaline phosphatase 65, total bilirubin 0.8, amylase 46, lipase 33.  Initial EKG revealed atelectasis at the left base with question nipple shadow.  HOSPITAL COURSE:  #1 -  IRON DEFICIENCY ANEMIA SECONDARY TO PROBABLE GASTROINTESTINAL BLEED:  The patient had somewhat confusing symptoms on admission given a history of a variety of nondescript symptoms.  However, on initial evaluation it was found that the patients hemoglobin was 7.0.  Therefore, the evaluation progressed from this finding.  The patient denies any history of bright red blood  per rectum; however, he gives a history of black stool approximately two weeks ago.  The patient was examined by the emergency room physician the early morning of admission and was found to have a guaiac negative stool.  Additional studies to evaluate the patient included obtaining iron studies and evaluating the patients thyroid function.  The iron studies revealed that the patient had a total iron of 122, TIBC 380, percent saturation 32, vitamin B12 331, ferritin 14.  The RBC folate was pending.  TSH was within normal limits at 3.52 with a mildly depressed free T4 at 0.81.  The patient was treated with modest IV fluids with normal saline at 75 cc/hour x 4, then at 50 cc/hour.  He was typed and crossed for 2 units of packed red blood cells and transfused each unit during the hospital course for a total of 2 units.  The repeat hemoglobin and hematocrit after the blood transfusion improved to 11.2 and 34.4 respectively.  Dr. Elpidio Anis, surgeon, was consulted to evaluate the patient via an EGD and/or colonoscopy for assessment of gastrointestinal bleed.  However, the patient became agitated and was unhappy with his n.p.o. status and therefore left AMA (against medical advice).  After multiple admonishments to the patient to remain in his room and to be patient with the testing that was scheduled, the patient still desired leaving the hospital against medical advice.  It was felt that the patient may have exhibited very early signs of alcohol withdrawal; however, the patient was alert and oriented, appropriate in his speech, and fully aware of his surroundings and his decision.  The patient was not happy about being n.p.o. and he said he wanted to go get something to eat and he wanted to come back to the hospital tomorrow.  The patients son was called and apparently he was unable to change the patients decision.  Therefore, his son, Lorin Picket, apparently picked the patient up.  The patient  was given a prescription for Hemocyte Plus to be taken q.d. for iron deficiency anemia.  He was admonished  to make an appointment to see his primary care doctor, Dr. Katrinka Blazing, the following week.  He was also admonished that if he had any rectal bleeding or any vomiting of blood he was advised to come back to the emergency room as soon as possible.  #2 - HISTORY OF ALCOHOL ABUSE:  The patient admitted to alcohol use; however, he downplayed the amount that he usually drinks.  He says that he drinks one to two drinks three to four days a week.  However, in discussing the patients history with his primary care doctor, Dr. Katrinka Blazing, it was revealed that the patient is a chronic alcoholic and drinks much more than he admitted.  In fact, the patient was scheduled for either a court hearing or community service related to his DWI conviction.  He was apparently supposed to appear in court on the morning of admission.  The patient was treated empirically with Librium 25 mg t.i.d. x 2 days; however, the patient only received one dose prior to leaving AMA.  The patients hemoglobin and initial leukopenia were probably secondary to  the patients chronic alcohol abuse.  The white blood cell count was initially 2.8, but improved to 5.2 the next day.  His hemoglobin, as above, improved from 7.0 to 11.2 after 2 units of packed red blood cell transfusion.  In addition to the Librium, the patient was treated with Hemocyte Plus one q.d. and Pepcid 20 mg q.d. during the hospital course.  #3 - TRANSIENT CHEST PAIN:  The patient, in addition to other nondescript symptoms, complained of transient chest pain.  Therefore, cardiac enzymes were obtained to rule out a myocardial infarction.  The first two sets of cardiac enzymes were negative.  His EKG revealed a sinus rhythm with a rate of 54.  A 2-D echocardiogram was obtained to further evaluate the patients symptoms. The 2-D echocardiogram revealed a very good left  ventricular function with no evidence of valvular abnormalities or right-sided abnormalities.  It is possible that the patients chest pain may have been transient gastroesophageal reflux disease from his alcohol abuse.  He had no complaints of chest pain during the hospitalization.  #4 - HYPERTENSION:  The patient had no prior history of treatment for hypertension.  He was hypertensive during his 24 hour hospital stay.  It is possible that the hypertension was secondary to the patients agitation and hospitalization.  However, given his age and history of chest pain, Norvasc 10 mg q.d. was started for empiric treatment of hypertension.  The patient was given a prescription prior to hospital discharge.  #5 - BRADYCARDIA:  The patients heart rate during the hospitalization ranged from 52-60.  He demonstrated no other abnormalities on his telemetry during the hospital course.  He appeared to have no symptoms related to his bradycardia during the hospital course.  As above, his chest pain was evaluated by a 2-D echocardiogram which was virtually within normal limits. Therefore, no additional treatment was started.  Of note, a calcium channel blocker such as verapamil or a beta blocker such as Toprol were entertained for treatment of his hypertension.  However, because the patient was bradycardic, Norvasc was chosen as the antihypertensive.  DISCHARGE LABORATORIES:  WBC 5.2, hemoglobin 11.2, hematocrit 34.4, MCV 72.9, platelets 520,000.  Sodium 136, potassium 3.6, chloride 104, CO2 34, glucose 99, BUN 6, creatinine 1.0, calcium 9.3.  CK 246, CK-MB 2.9, relative index 1.2, troponin I 0.03. Dictated by:   Elliot Cousin, M.D. Attending Physician:  Elliot Cousin DD:  10/16/01 TD:  10/17/01 Job: [id number]

## 2011-03-18 NOTE — Consult Note (Signed)
NAMEHARVE, SPRADLEY NO.:  [id number]   MEDICAL RECORD NO.:  [id number]                  PATIENT TYPE:   LOCATION:                                       FACILITY:   PHYSICIAN:  Lionel December, M.D.                 DATE OF BIRTH:  1930/05/17   DATE OF CONSULTATION:  12/16/2003  DATE OF DISCHARGE:                                   CONSULTATION   CHIEF COMPLAINT:  Anemia, gastrointestinal bleed.   HISTORY OF PRESENT ILLNESS:  Mr. Stjames is a 77 year old Caucasian male,  who presents to our office as a referral from [place] C. Katrinka Blazing, M.D.  Mr.  Sponaugle today, denies any rectal bleeding or melena.  He states he believes  he, just ate a piece a glass and that has caused him to have all these  problems.  He denies any abdominal pain, nausea, vomiting or diarrhea.  He  denies any aspirin use.  He denies any prior history of problems including  peptic ulcer disease, however, further review of his medical records show  that he had history of severe anemia in February of 2004 with heme-positive  stool and hemoglobin of 7.2.  Apparently, he has had multiple episodes of  melena and hemoglobin which has fluctuated from 8.2 to 11.  At that point in  time, he required two units of packed RBC's.  Dr. Katrinka Blazing felt that he could  have received a third, however, due to supply he was only given two.  Post-  transfuse hemoglobin was 9.9.  During hospitalization, he underwent EGD and  colonoscopy by Dr. Katrinka Blazing which revealed enteral gastritis with erosions on  EGD and small internal hemorrhoids.  There were no significant findings to  attribute for his bleeding.   PAST MEDICAL HISTORY:  1. Recurrent gastrointestinal bleeding with severe anemia.  EGD and     colonoscopy by D[facility] Dress. Katrinka Blazing, M.D. in February, 2004, with findings as     described in History of Present Illness.  2. Chronic alcoholism.  3. Schizo-affective disorder.  4. Hypertension.  5. Chronic headaches.  6. History of heavy aspirin use.  However, denies any currently.   PAST SURGICAL HISTORY:  Denies any.   CURRENT MEDICATIONS:  1. Restoril 30 mg daily.  2. Xanax 0.5 mg t.i.d.  3. Vicodin 200 mg q.i.d.   ALLERGIES:  No known drug allergies.   FAMILY HISTORY:  Mr. Steppe is a very poor historian.  Both mother and  father are deceased.  He has three sisters, two deceased of unknown  etiology.  Unsure as to whether there is any family history of colonic  carcinoma, liver or chronic GI problems.   SOCIAL HISTORY:  [name] has been married for 45 years.  However, his  wife is accompanying him today and states that they live separate lives.  He  has six children who reportedly are in good health.  He is currently  retired.  He does have history of chewing tobacco.  He has history of heavy  alcohol use for the last 40 years.  However, he states he is not consumed  any alcohol in approximately two weeks.   REVIEW OF SYSTEMS:  GENERAL:  Reportedly, weight is stable.  Denies any  fever or chills.  CARDIOVASCULAR:  Denies any chest pain or palpitations.  PULMONARY:  Denies any shortness of breath, dyspnea or hemoptysis.  HEMATOLOGY:  Denies any history of anemia or blood dyscrasias other than  incident in 2004 secondary to presumed gastrointestinal bleeding.  GI:  See  History of Present Illness.  Denies any dysphagia, odynophagia.  Denies any  external hemorrhoids.   PHYSICAL EXAMINATION:  GENERAL:  [name] is a 77 year old thin,  Caucasian male who is alert and oriented and in no acute distress.  Weight  139.5 pounds.  Height 64 inches.  He is accompanied by his wife.  VITAL SIGNS:  Temperature 96.1, blood pressure 100/60, pulse 56.  HEENT:  Sclerae clear.  Conjunctivae pink.  Oropharynx pink and moist  without any lesions.  NECK:  Supple without any mass or thyromegaly.  HEART:  Heart rate slightly bradycardic with a rate around 56.  However, is  regular and without any  murmurs, clicks, rubs or gallops.  LUNGS:  Clear to auscultation bilaterally.  ABDOMEN:  Positive bowel sounds x4, soft, nontender, nondistended.  No  palpable mass or hepatosplenomegaly.  No rebound tenderness.  RECTAL:  Good sphincter tone.  There is a small amount of dark brown  Hemoccult positive stool in the vault.  EXTREMITIES:  No edema.  Good pulses.  SKIN:  Brown, warm and dry without any rash or jaundice.   ASSESSMENT:  [name] is a 77 year old Caucasian male with anemia  and Hemoccult positive stools; apparently workup last year for similar  scenario by Dr. Katrinka Blazing with esophagogastroduodenoscopy and colonoscopy was  unremarkable.  I will discuss this case further with Lionel December, M.D.  Would also like to obtain CBC to evaluate the extent of his anemia as we do  not have any records from Dr. Michaelle Copas office.  Also, will try to obtain  anemia profile if this has not been done as well.  Symptoms are definitely  very suspicious for peptic ulcer disease.  However, given negative  colonoscopy and EGD in the past, he may be having chronic bleeding from the  small bowel.  Ultimately, will probably need esophagogastroduodenoscopy by  Dr. Karilyn Cota with possible followup capsule endoscopy study if EGD is  negative.  Will discuss this further with Dr. Karilyn Cota.   RECOMMENDATIONS:  1. Check CBC, PT, INR and LFT's given history of heavy alcohol intake.  2. Will discuss the case further with Dr. Karilyn Cota for his recommendations.  3. Will schedule Mr. Nuon for EGD in the near future, possibly followed     by given capsule study if EGD     negative.  4. Further recommendations pending discussion with Lionel December, M.D.   We would like to thank Jerolyn Shin C. Katrinka Blazing, M.D. for allowing [place] to participate  in the care of Mr. Beasley.     ________________________________________  ___________________________________________ Nicholas Lose, N.P.                  Lionel December,  M.D.   KC/MEDQ  D:  12/17/2003  T:  12/18/2003  Job:  [id number]   cc:   Lionel December, M.D.  [id number]  [place]  [place] [address]  Fax: [phone]   Dirk Dress. Katrinka Blazing, M.D.  [address]  [place]  [place] [address]  Fax: [phone]

## 2011-03-18 NOTE — Consult Note (Signed)
NAMEMACALLAN, ORD NO.:  [id number]   MEDICAL RECORD NO.:  [id number]          PATIENT TYPE:  EMS   LOCATION:  ED                            FACILITY:  [facility]   PHYSICIAN:  Osvaldo Shipper, MD     DATE OF BIRTH:  [birth year]   DATE OF CONSULTATION:  07/24/2006  DATE OF DISCHARGE:  07/24/2006                                   CONSULTATION   Patient goes to the [facility], his previous P.M.D. was  Dr. Katrinka Blazing.   PERSON REQUESTING EVALUATION:  Rhae Lerner. Margretta Ditty, M.D.   CHIEF COMPLAINT:  Dizziness.   HISTORY OF PRESENT ILLNESS:  The patient is a 77 year old African-American  male who carries the diagnosis of schizoaffective disorder.  Also has  history of anemia, possible anxiety disorder and history of chronic GI blood  loss for which he has been evaluated by GI in the past.  The patient  presented to the ED complaining of dizziness and a mild episode of upper  abdominal pain yesterday.  We were called in to see the patient because of  his low hemoglobin of 7.8.   Patient mentioned to me that yesterday he had mild upper abdominal lower  chest wall pain.  He took aspirin yesterday, this was last night, and when  he woke up this morning his pain was relieved.  He denied any shortness of  breath with this episode of pain yesterday.  He denied any nausea, vomiting,  diarrhea.  No history of other abdominal pain as such.   This morning patient was at [facility] when he started feeling dizzy,  lightheaded, but he did not have any syncopal episodes.  He felt  palpitations but did not have any chest pains and no shortness of breath  this morning.  Did not have any focal weakness.  At which time he was told  by his friends to come into the ED to get himself checked out.  Patient then  also gave history of having black stools for the past 3 days now.  He states  he takes a multivitamin which has iron in it which causes his stool to  become black,  although he mentioned that he has been taking the iron pill,  or this multivitamin pill, for the past 4 months and has not had any black  stools consistently.  He also admits to some weight loss, though he is  unable to quantify.  He also admits to some poor p.o. intake.  Denies any  urinary complaints at this time.  Patient is somewhat of a poor historian as  he easily gets distracted.   MEDICATIONS AT HOME:  1. Multivitamins, possibly with iron.  2. He also takes an anxiolytic of unknown type.   ALLERGIES:  NO KNOWN DRUG ALLERGIES.   PAST MEDICAL HISTORY:  1. Patient has a history of GI blood loss in the past.  He has undergone      EGDs and colonoscopies in the past, the last mention of it is from a      Discharge Summary in March, 2005.  Apparently, he had an EGD in March      of 2005 which showed acute gastritis in the antrum, body and fundus.      Rest of the upper GI structures are normal.  Apparently he has had      colonoscopy also in March which revealed some vascular abnormalities in      the cecum and ascending colon with some passive oozing noted in some of      the visible vessels in this area.  2. According to some of the notes from his old chart, he also had a GIVEN      capsule study, the results of which are not available to [place].   He also carries the diagnosis of schizoaffective disorder, though he does  not seem to take any medications for this.  He has mentioned that he has  bad nerves.  Denies any surgical procedures in the past.   SOCIAL HISTORY:  Lives in [place].  Lives alone, though he states his ex-  wife lives close by and checks on him every day.  He states he had liquor  and some beer on Friday and Saturday but does not consume alcohol on a daily  basis.  Denies smoking use, denies any illicit drug use.  He is independent  with his daily activities.   FAMILY HISTORY:  No significant problems in the family as per the patient.   REVIEW OF SYSTEMS:   Was otherwise unremarkable except as mentioned in the  HPI, a 10-point ROS was done.   PHYSICAL EXAMINATION:  VITAL SIGNS:  Initially when he presented his  temperature was 98, blood pressure was 86/51, heart rate was 98, respiratory  rate 24, saturation 99%.  Subsequently, blood pressures were 102/63, heart  rate 81, respiratory rate 18, saturation 96%.  GENERAL EXAM:  A thin elderly black male quite pleasant to talk to but in no  distress.  HEENT:  There is pallor present, no icterus.  Oral mucous membranes moist.  No oral lesions were noted.  NECK:  Soft and supple, no thyromegaly was appreciated.  LUNGS:  Clear to auscultation bilaterally.  CARDIOVASCULAR:  S1, S2 was normal, regular, no murmurs appreciated.  ABDOMEN:  Soft abdomen, nontender, nondistended.  Bowel sounds were present.  No mass or organomegaly was appreciated.  RECTAL EXAM:  Black stool which was strongly heme positive.  NEUROLOGIC:  Patient was alert, oriented x3.  No focal neurological deficits  were present.   LAB DATA:  His white count was 3.8, hemoglobin 7.8 with MCV 78, platelet  count is 438, potassium 3.2, glucose 142, other parameters on the BMET  normal.  One set of cardiac markers negative.   EKG was done which showed sinus rhythm with some nonspecific changes with no  change noted from previous EKG done in 2005.   IMPRESSION:  This is a 77 year old African-American male who has a history  of chronic gastrointestinal blood loss, schizoaffective disorder and anemia  who presents with dizziness.  He also has melena.  Most likely this patient  has experienced some gastrointestinal blood loss.  He took a couple of  aspirin yesterday and today.  Denies any chronic nonsteroidal anti-  inflammatory drug use though.  Most likely patient's symptoms of dizziness  are likely to his anemia which is possibly from upper GI blood loss.  PLAN:  The plan for this patient was to admit him to the hospital and have  him  seen by Gastroenterology Service to  consider an upper endoscopy,  considering his symptoms and presentation.  When I told the patient of his  findings and the plan, patient was quite surprised that he was to be  admitted.  He subsequently told me that he would prefer not to be admitted.  I emphasized that the patient could have a larger GI bleed and that he could  lose more blood and then could pass out and potentially bleed out.  Patient  understands this risk and he mentions to me that he wants to go home and  manage his care himself.  He says that if he feels unwell he will come back  to the ED, but at this time he vehemently refused admission.   Considering the above, patient was allowed to go against medical advice.  Even though he carries the diagnosis of schizoaffective disorder, he  appeared to have good understanding and insight into his problem but he  still refused to get admitted.  He also told me that he would go talk to his  P.M.D. about his problem.      Osvaldo Shipper, MD  Electronically Signed     GK/MEDQ  D:  07/24/2006  T:  07/24/2006  Job:  [id number]
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
## 2011-04-08 ENCOUNTER — Emergency Department (HOSPITAL_COMMUNITY)
Admission: EM | Admit: 2011-04-08 | Discharge: 2011-04-08 | Disposition: A | Discharge status: Home / Self Care | Payer: Medicare Other | Attending: Emergency Medicine | Admitting: Emergency Medicine

## 2011-04-08 DIAGNOSIS — F411 Generalized anxiety disorder: Secondary | ICD-10-CM | POA: Insufficient documentation

## 2011-04-08 DIAGNOSIS — Z760 Encounter for issue of repeat prescription: Secondary | ICD-10-CM | POA: Insufficient documentation

## 2011-05-04 ENCOUNTER — Emergency Department (HOSPITAL_COMMUNITY): Payer: Medicare Other

## 2011-05-04 ENCOUNTER — Emergency Department (HOSPITAL_COMMUNITY)
Admission: EM | Admit: 2011-05-04 | Discharge: 2011-05-04 | Disposition: A | Discharge status: Home / Self Care | Payer: Medicare Other | Attending: Emergency Medicine | Admitting: Emergency Medicine

## 2011-05-04 DIAGNOSIS — R5381 Other malaise: Secondary | ICD-10-CM | POA: Insufficient documentation

## 2011-05-04 DIAGNOSIS — F1010 Alcohol abuse, uncomplicated: Secondary | ICD-10-CM | POA: Insufficient documentation

## 2011-05-04 DIAGNOSIS — R0602 Shortness of breath: Secondary | ICD-10-CM | POA: Insufficient documentation

## 2011-05-04 DIAGNOSIS — M549 Dorsalgia, unspecified: Secondary | ICD-10-CM | POA: Insufficient documentation

## 2011-05-04 DIAGNOSIS — R5383 Other fatigue: Secondary | ICD-10-CM | POA: Insufficient documentation

## 2011-05-04 DIAGNOSIS — D649 Anemia, unspecified: Secondary | ICD-10-CM | POA: Insufficient documentation

## 2011-05-04 LAB — BASIC METABOLIC PANEL
CO2: 26 mEq/L (ref 19–32)
Calcium: 8.8 mg/dL (ref 8.4–10.5)
Creatinine, Ser: 0.8 mg/dL (ref 0.50–1.35)
GFR calc non Af Amer: >60 mL/min (ref >60)
Glucose, Bld: 87 mg/dL (ref 70–99)

## 2011-05-04 LAB — CK TOTAL AND CKMB (NOT AT ARMC)
CK, MB: 3.7 ng/mL (ref 0.3–4.0)
Relative Index: 2.5 (ref 0.0–2.5)
Total CK: 148 U/L (ref 7–232)

## 2011-05-04 LAB — CBC
HCT: 25.6 % — ABNORMAL LOW (ref 39.0–52.0)
Hemoglobin: 7.9 g/dL — ABNORMAL LOW (ref 13.0–17.0)
MCHC: 30.9 g/dL (ref 30.0–36.0)
WBC: 2 10*3/uL — ABNORMAL LOW (ref 4.0–10.5)

## 2011-05-04 LAB — DIFFERENTIAL
Basophils Relative: 4 % — ABNORMAL HIGH (ref 0–1)
Eosinophils Relative: 6 % — ABNORMAL HIGH (ref 0–5)
Lymphocytes Relative: 36 % (ref 12–46)
Monocytes Absolute: 0.4 10*3/uL (ref 0.1–1.0)
Monocytes Relative: 19 % — ABNORMAL HIGH (ref 3–12)
Neutrophils Relative %: 35 % — ABNORMAL LOW (ref 43–77)

## 2011-05-04 LAB — ETHANOL: Alcohol, Ethyl (B): 213 mg/dL — ABNORMAL HIGH (ref 0–11)

## 2011-05-07 ENCOUNTER — Emergency Department (HOSPITAL_COMMUNITY)
Admission: EM | Admit: 2011-05-07 | Discharge: 2011-05-07 | Disposition: A | Discharge status: Home / Self Care | Payer: Medicare Other | Attending: Emergency Medicine | Admitting: Emergency Medicine

## 2011-05-07 DIAGNOSIS — R5381 Other malaise: Secondary | ICD-10-CM | POA: Insufficient documentation

## 2011-05-07 DIAGNOSIS — R5383 Other fatigue: Secondary | ICD-10-CM | POA: Insufficient documentation

## 2011-05-07 MED ORDER — SODIUM CHLORIDE 0.9 % IV SOLN: INTRAVENOUS | Status: DC

## 2011-05-07 NOTE — ED Notes (Signed)
Pt states he was here the other day and had lab work drawn. States he has been weak since

## 2011-05-07 NOTE — ED Notes (Signed)
JXB:[id number]<NF> Expected date:05/07/11<BR> Expected time: 4:02 PM<BR> Means of arrival:Ambulance<BR> Comments:<BR> Chest pain

## 2011-05-08 ENCOUNTER — Inpatient Hospital Stay (HOSPITAL_COMMUNITY)
Admission: EM | Admit: 2011-05-08 | Discharge: 2011-05-08 | DRG: 812 | Discharge status: Against Medical Advice | Payer: Medicare Other | Attending: Pulmonary Disease | Admitting: Pulmonary Disease

## 2011-05-08 ENCOUNTER — Other Ambulatory Visit: Payer: Self-pay

## 2011-05-08 ENCOUNTER — Encounter (HOSPITAL_COMMUNITY): Payer: Self-pay | Admitting: *Deleted

## 2011-05-08 DIAGNOSIS — G4700 Insomnia, unspecified: Secondary | ICD-10-CM | POA: Diagnosis present

## 2011-05-08 DIAGNOSIS — D500 Iron deficiency anemia secondary to blood loss (chronic): Principal | ICD-10-CM | POA: Diagnosis present

## 2011-05-08 DIAGNOSIS — F259 Schizoaffective disorder, unspecified: Secondary | ICD-10-CM | POA: Diagnosis present

## 2011-05-08 DIAGNOSIS — R7309 Other abnormal glucose: Secondary | ICD-10-CM | POA: Diagnosis present

## 2011-05-08 DIAGNOSIS — K5520 Angiodysplasia of colon without hemorrhage: Secondary | ICD-10-CM | POA: Diagnosis present

## 2011-05-08 DIAGNOSIS — K449 Diaphragmatic hernia without obstruction or gangrene: Secondary | ICD-10-CM

## 2011-05-08 DIAGNOSIS — F411 Generalized anxiety disorder: Secondary | ICD-10-CM | POA: Diagnosis present

## 2011-05-08 DIAGNOSIS — F1020 Alcohol dependence, uncomplicated: Secondary | ICD-10-CM | POA: Diagnosis present

## 2011-05-08 DIAGNOSIS — F419 Anxiety disorder, unspecified: Secondary | ICD-10-CM | POA: Diagnosis present

## 2011-05-08 DIAGNOSIS — D649 Anemia, unspecified: Secondary | ICD-10-CM | POA: Diagnosis present

## 2011-05-08 LAB — RETICULOCYTES
RBC.: 2.86 MIL/uL — ABNORMAL LOW (ref 4.22–5.81)
Retic Count, Absolute: 37.2 10*3/uL (ref 19.0–186.0)

## 2011-05-08 LAB — PREPARE RBC (CROSSMATCH)

## 2011-05-08 LAB — DIFFERENTIAL
Basophils Absolute: 0 10*3/uL (ref 0.0–0.1)
Eosinophils Absolute: 0.1 10*3/uL (ref 0.0–0.7)
Eosinophils Relative: 4 % (ref 0–5)
Neutrophils Relative %: 58 % (ref 43–77)

## 2011-05-08 LAB — CBC
MCH: 22.5 pg — ABNORMAL LOW (ref 26.0–34.0)
MCV: 73.8 fL — ABNORMAL LOW (ref 78.0–100.0)
Platelets: 485 10*3/uL — ABNORMAL HIGH (ref 150–400)
RDW: 21 % — ABNORMAL HIGH (ref 11.5–15.5)
WBC: 3.6 10*3/uL — ABNORMAL LOW (ref 4.0–10.5)

## 2011-05-08 LAB — BASIC METABOLIC PANEL
BUN: 11 mg/dL (ref 6–23)
CO2: 25 mEq/L (ref 19–32)
Calcium: 10 mg/dL (ref 8.4–10.5)
Creatinine, Ser: 0.89 mg/dL (ref 0.50–1.35)
Glucose, Bld: 120 mg/dL — ABNORMAL HIGH (ref 70–99)

## 2011-05-08 LAB — HEMOGLOBIN AND HEMATOCRIT, BLOOD
HCT: 25.1 % — ABNORMAL LOW (ref 39.0–52.0)
Hemoglobin: 8 g/dL — ABNORMAL LOW (ref 13.0–17.0)

## 2011-05-08 LAB — CARDIAC PANEL(CRET KIN+CKTOT+MB+TROPI): Total CK: 187 U/L (ref 7–232)

## 2011-05-08 LAB — IRON AND TIBC: Iron: 24 ug/dL — ABNORMAL LOW (ref 42–135)

## 2011-05-08 LAB — VITAMIN B12: Vitamin B-12: 257 pg/mL (ref 211–911)

## 2011-05-08 MED ORDER — ACETAMINOPHEN 325 MG PO TABS: 650.0000 mg | ORAL_TABLET | Freq: Four times a day (QID) | ORAL | Status: DC | PRN

## 2011-05-08 MED ORDER — DIPHENHYDRAMINE HCL 25 MG PO CAPS: 25.0000 mg | ORAL_CAPSULE | Freq: Once | ORAL | Status: DC

## 2011-05-08 MED ORDER — SODIUM CHLORIDE 0.9 % IV SOLN
Freq: Once | INTRAVENOUS | Status: AC
  Administered 2011-05-08: 01:00:00 via INTRAVENOUS

## 2011-05-08 MED ORDER — ACETAMINOPHEN 325 MG PO TABS: 650.0000 mg | ORAL_TABLET | Freq: Once | ORAL | Status: DC

## 2011-05-08 MED ORDER — PANTOPRAZOLE SODIUM 40 MG IV SOLR: 40.0000 mg | INTRAVENOUS | Status: DC

## 2011-05-08 MED ORDER — FUROSEMIDE 10 MG/ML IJ SOLN: 20.0000 mg | Freq: Once | INTRAMUSCULAR | Status: DC

## 2011-05-08 MED ORDER — SODIUM CHLORIDE 0.9 % IV SOLN
INTRAVENOUS | Status: DC
  Administered 2011-05-08: 06:00:00 via INTRAVENOUS

## 2011-05-08 MED ORDER — SODIUM CHLORIDE 0.9 % IJ SOLN
INTRAMUSCULAR | Status: AC
  Administered 2011-05-08: 06:00:00
  Filled 2011-05-08: qty 3

## 2011-05-08 MED ORDER — SODIUM CHLORIDE 0.9 % IV SOLN: INTRAVENOUS | Status: DC

## 2011-05-08 MED ORDER — DIPHENHYDRAMINE HCL 25 MG PO CAPS
25.0000 mg | ORAL_CAPSULE | Freq: Once | ORAL | Status: DC
Start: 2011-05-08 — End: 2011-05-08

## 2011-05-08 MED ORDER — DIPHENHYDRAMINE HCL 50 MG/ML IJ SOLN: 25.0000 mg | Freq: Once | INTRAMUSCULAR | Status: DC

## 2011-05-08 NOTE — Plan of Care (Signed)
Problem: Discharge Progression Outcomes Goal: Other Discharge Outcomes/Goals Outcome: Completed/Met Date Met:  05/08/11 Patient discharged,states understanding of discharge,iv out with site wnl

## 2011-05-08 NOTE — ED Notes (Signed)
Pt c/o feeling like his heart is racing; pt states he was seen here earlier today for same complaint but left before all treatment was done

## 2011-05-08 NOTE — Progress Notes (Signed)
Discharged from room states understanding of discharge

## 2011-05-08 NOTE — ED Notes (Signed)
Report called to Linwood Dibbles, RN

## 2011-05-08 NOTE — H&P (Signed)
[name], [name] NO.:  [id number]  MEDICAL RECORD NO.:  [id number]  LOCATION:  A304                          FACILITY:  [facility]  PHYSICIAN:  Mila Homer. Sudie Bailey, M.D.DATE OF BIRTH:  04/08/1930  DATE OF ADMISSION:  05/08/2011 DATE OF DISCHARGE:  07/08/2012LH                             HISTORY & PHYSICAL   This 77 year old man presented to the emergency department late last night just not feeling well.  He was brought by somebody he knows who lives on the same block.  He currently lives at home and his son lives with him.  His wife lives elsewhere in town.  He has 2 sisters who apparently live close by.  He is a patient Dr. Shaune Pollack.  He is a little bit indistinct and his history tends to wander when asked and tends to not be able to answer questions but jumps from subject to subject.  He does have a history of schizoaffective disorder, alcoholism, AV malformations.  The electronic record was reviewed for this.  He was asked about alcohol consumption and denied this.  He claimed he had no bright red blood per rectum or dark stools.  It was really hard to get a history from him, which made all other review of systems very difficult, however.  Further review of the record showed that just 4 days ago he had a hemoglobin of 7.9 and was seen in the [facility] Emergency Department.  His alcohol level at that time was 213.  His white cell count was 2000.  He had a colonoscopy by Dr. Kassie Mends- Fields about 3 years ago.  Multiple AV malformations were noted at that time.  She had strongly recommend to him that he stop alcohol and, in fact, at that time he had wanted to leave the hospital AMA.  CURRENT MEDICATIONS:  We think he is taking include hydrocodone/APAP 5/325 q.6 h. for pain, lorazepam 1 mg b.i.d. for anxiety, and Ventolin HFA inhaler 2 puffs q.6 h.  PHYSICAL EXAMINATION:  Admission exam showed a pleasant elderly man who is  somewhat confused.  His temperature is 98.3, pulse 87, respiratory rate 20, blood pressure 146/81.  His speech appeared to be normal, but again, he wandered significantly from one subject to another. His mucous membranes appeared to be moist. Skin turgor was normal. His heart had a regular rhythm without murmur, a rate of about 80. His lungs show decreased breath sounds throughout. His abdomen was soft without demonstrable organomegaly or mass. He had no edema of the ankles.  His admission white cell count is 3600, which is up from 2000 just 4 days ago.  Hemoglobin was 6.8, down from 7.9, and after transfusion went up to 8.0.  He had a BUN of 11, creatinine 0.89, and BMP was otherwise normal except for a glucose of 120.  He was admitted to the hospital, put on IV fluids, and was typed and crossed for 2 units of packed cells and had 1 transfused, following which his hemoglobin increased and he was ready to walk out the door at this point.  I talked to him about keeping track of his hemoglobin.  I recommended he  see Dr. Juanetta Gosling, his LMD, this coming week.  I talked to him about alcohol but he adamantly denied using alcohol recently (despite his alcohol level of 213 just days before this).  FINAL DISCHARGE DIAGNOSES: 1. Severe anemia probably secondary to arteriovenous malformations and     a chronic blood loss. 2. Arteriovenous malformations. 3. Chronic ethyl alcoholism. 4. Confusion, probably multifactorial. 5. Hyperglycemia. 6. Hiatal hernia. 7. Insomnia. 8. Anxiety.  The patient was admitted and discharged in 24 hours. He wanted to leave the hospital AMA today but agreed to stay until I had the results of his post transfusion hemoglobin.  I would have preferred that he stay at least one or two more days, but he was adamant about leaving. He did agree to get follow-up with Dr. Juanetta Gosling this week.Marland Kitchen  He is to resume his outpatient medications and stay off alcohol.     Mila Homer. Sudie Bailey, M.D.     SDK/MEDQ  D:  05/08/2011  T:  05/08/2011  Job:  [id number]

## 2011-05-08 NOTE — H&P (Signed)
[id number] 

## 2011-05-12 LAB — TYPE AND SCREEN
Antibody Screen: NEGATIVE
Unit division: 0

## 2011-05-14 NOTE — ED Provider Notes (Signed)
History     Chief Complaint  Patient presents with  . Fatigue   HPI  Past Medical History  Diagnosis Date  . No pertinent past medical history     Past Surgical History  Procedure Date  . No past surgeries     No family history on file.  History  Substance Use Topics  . Smoking status: Former Smoker    Quit date: 05/07/1969  . Smokeless tobacco: Not on file  . Alcohol Use: 1.2 oz/week    2 Cans of beer per week      Review of Systems  Physical Exam  BP 85/51  Pulse 110  Temp(Src) 98.3 F (36.8 C) (Oral)  Resp 18  SpO2 97%  Physical Exam  ED Course  Procedures  MDM This patient left AMA before I was able to evaluate him at the bedside. No physical exam or history performed by me.      Celene Kras, MD 05/14/11 [phone]

## 2011-06-10 NOTE — ED Provider Notes (Signed)
History     CSN: [id number] Arrival date & time: 05/08/2011 12:45 AM  Chief Complaint  Patient presents with  . Irregular Heart Beat   The history is provided by the patient.  heart racing for several days;  No cp; pos sob; exertion makes it worse;  Rest makes it better;  Heavy drinker.  Level 5 caveat: need for intervention  Past Medical History  Diagnosis Date  . No pertinent past medical history     Past Surgical History  Procedure Date  . No past surgeries     History reviewed. No pertinent family history.  History  Substance Use Topics  . Smoking status: Former Smoker    Quit date: 05/07/1969  . Smokeless tobacco: Not on file  . Alcohol Use: 1.2 oz/week    2 Cans of beer per week      Review of Systems  Unable to perform ROS: Other    Physical Exam  BP 144/77  Pulse 70  Temp(Src) 97.9 F (36.6 C) (Oral)  Resp 18  Ht 5\' 8"  (1.727 m)  Wt 118 lb 6.2 oz (53.7 kg)  BMI 18.00 kg/m2  SpO2 95%  Physical Exam  Nursing note and vitals reviewed. Constitutional: He is oriented to person, place, and time. He appears well-developed and well-nourished.  HENT:  Head: Normocephalic and atraumatic.  Eyes: Conjunctivae and EOM are normal. Pupils are equal, round, and reactive to light.  Neck: Normal range of motion. Neck supple.  Cardiovascular: Normal rate and regular rhythm.   Pulmonary/Chest: Effort normal and breath sounds normal.  Abdominal: Soft. Bowel sounds are normal.  Musculoskeletal: Normal range of motion.  Neurological: He is alert and oriented to person, place, and time.  Skin: Skin is warm and dry.  Psychiatric: He has a normal mood and affect.    ED Course  Procedures  MDM Significant anemia is most likely contributing to palpitations; admit    Results for orders placed during the hospital encounter of 05/08/11  CBC      Component Value Range   WBC 3.6 (*) 4.0 - 10.5 (K/uL)   RBC 3.02 (*) 4.22 - 5.81 (MIL/uL)   Hemoglobin 6.8 (*) 13.0 -  17.0 (g/dL)   HCT 91.4 (*) 78.2 - 52.0 (%)   MCV 73.8 (*) 78.0 - 100.0 (fL)   MCH 22.5 (*) 26.0 - 34.0 (pg)   MCHC 30.5  30.0 - 36.0 (g/dL)   RDW 95.6 (*) 21.3 - 15.5 (%)   Platelets 485 (*) 150 - 400 (K/uL)  DIFFERENTIAL      Component Value Range   Neutrophils Relative 58  43 - 77 (%)   Neutro Abs 2.1  1.7 - 7.7 (K/uL)   Lymphocytes Relative 22  12 - 46 (%)   Lymphs Abs 0.8  0.7 - 4.0 (K/uL)   Monocytes Relative 16 (*) 3 - 12 (%)   Monocytes Absolute 0.6  0.1 - 1.0 (K/uL)   Eosinophils Relative 4  0 - 5 (%)   Eosinophils Absolute 0.1  0.0 - 0.7 (K/uL)   Basophils Relative 1  0 - 1 (%)   Basophils Absolute 0.0  0.0 - 0.1 (K/uL)  CARDIAC PANEL(CRET KIN+CKTOT+MB+TROPI)      Component Value Range   Total CK 187  7 - 232 (U/L)   CK, MB 3.4  0.3 - 4.0 (ng/mL)   Troponin I <0.30  <0.30 (ng/mL)   Relative Index 1.8  0.0 - 2.5   BASIC METABOLIC PANEL  Component Value Range   Sodium 134 (*) 135 - 145 (mEq/L)   Potassium 3.9  3.5 - 5.1 (mEq/L)   Chloride 100  96 - 112 (mEq/L)   CO2 25  19 - 32 (mEq/L)   Glucose, Bld 120 (*) 70 - 99 (mg/dL)   BUN 11  6 - 23 (mg/dL)   Creatinine, Ser 9.60  0.50 - 1.35 (mg/dL)   Calcium 45.4  8.4 - 10.5 (mg/dL)   GFR calc non Af Amer >60  >60 (mL/min)   GFR calc Af Amer >60  >60 (mL/min)  PREPARE RBC (CROSSMATCH)      Component Value Range   Order Confirmation ORDER PROCESSED BY BLOOD BANK    TYPE AND SCREEN      Component Value Range   ABO/RH(D) O POS     Antibody Screen NEG     Sample Expiration 05/11/2011     Unit Number [id number]     Blood Component Type RED CELLS,LR     Unit division 00     Status of Unit REL FROM [facility]     Transfusion Status OK TO TRANSFUSE     Crossmatch Result Compatible     Unit Number [id number]     Blood Component Type RED CELLS,LR     Unit division 00     Status of Unit ISSUED,FINAL     Transfusion Status OK TO TRANSFUSE     Crossmatch Result Compatible    VITAMIN B12      Component Value Range   Vitamin  B-12 257  211 - 911 (pg/mL)  IRON AND TIBC      Component Value Range   Iron 24 (*) 42 - 135 (ug/dL)   TIBC 865  784 - 696 (ug/dL)   Saturation Ratios 7 (*) 20 - 55 (%)   UIBC 330    FERRITIN      Component Value Range   Ferritin 11 (*) 22 - 322 (ng/mL)  FOLATE      Component Value Range   Folate 10.1    RETICULOCYTES      Component Value Range   Retic Ct Pct 1.3  0.4 - 3.1 (%)   RBC. 2.86 (*) 4.22 - 5.81 (MIL/uL)   Retic Count, Manual 37.2  19.0 - 186.0 (K/uL)  HEMOGLOBIN AND HEMATOCRIT, BLOOD      Component Value Range   Hemoglobin 8.0 (*) 13.0 - 17.0 (g/dL)   HCT 29.5 (*) 28.4 - 52.0 (%)   No results found.  Date: 06/10/2011  Rate: 78  Rhythm: normal sinus rhythm  QRS Axis: normal  Intervals: normal  ST/T Wave abnormalities: normal  Conduction Disutrbances:none  Narrative Interpretation:   Old EKG Reviewed: none available   Donnetta Hutching, MD 06/10/11 1355

## 2011-07-08 ENCOUNTER — Emergency Department (HOSPITAL_COMMUNITY)
Admission: EM | Admit: 2011-07-08 | Discharge: 2011-07-08 | Discharge status: Against Medical Advice | Payer: Medicare Other | Attending: Emergency Medicine | Admitting: Emergency Medicine

## 2011-07-08 ENCOUNTER — Encounter (HOSPITAL_COMMUNITY): Payer: Self-pay | Admitting: *Deleted

## 2011-07-08 ENCOUNTER — Emergency Department (HOSPITAL_COMMUNITY): Payer: Medicare Other

## 2011-07-08 DIAGNOSIS — R0602 Shortness of breath: Secondary | ICD-10-CM | POA: Insufficient documentation

## 2011-07-08 DIAGNOSIS — IMO0001 Reserved for inherently not codable concepts without codable children: Secondary | ICD-10-CM

## 2011-07-08 DIAGNOSIS — J441 Chronic obstructive pulmonary disease with (acute) exacerbation: Secondary | ICD-10-CM | POA: Insufficient documentation

## 2011-07-08 DIAGNOSIS — Z5320 Procedure and treatment not carried out because of patient's decision for unspecified reasons: Secondary | ICD-10-CM | POA: Insufficient documentation

## 2011-07-08 HISTORY — DX: Essential (primary) hypertension: I10

## 2011-07-08 HISTORY — DX: Alcohol abuse, uncomplicated: F10.10

## 2011-07-08 HISTORY — DX: Chronic obstructive pulmonary disease, unspecified: J44.9

## 2011-07-08 HISTORY — DX: Anxiety disorder, unspecified: F41.9

## 2011-07-08 HISTORY — DX: Angiodysplasia of colon without hemorrhage: K55.20

## 2011-07-08 HISTORY — DX: Headache: R51

## 2011-07-08 HISTORY — DX: Headache, unspecified: R51.9

## 2011-07-08 LAB — BASIC METABOLIC PANEL
Chloride: 108 mEq/L (ref 96–112)
GFR calc Af Amer: >60 mL/min (ref >60)
GFR calc non Af Amer: >60 mL/min (ref >60)
Potassium: 4.3 mEq/L (ref 3.5–5.1)

## 2011-07-08 LAB — POCT I-STAT TROPONIN I: Troponin i, poc: 0 ng/mL (ref 0.00–0.08)

## 2011-07-08 LAB — DIFFERENTIAL
Basophils Absolute: 0.1 10*3/uL (ref 0.0–0.1)
Basophils Relative: 2 % — ABNORMAL HIGH (ref 0–1)
Neutro Abs: 1.5 10*3/uL — ABNORMAL LOW (ref 1.7–7.7)
Neutrophils Relative %: 48 % (ref 43–77)

## 2011-07-08 LAB — CBC
MCHC: 28.4 g/dL — ABNORMAL LOW (ref 30.0–36.0)
Platelets: 438 10*3/uL — ABNORMAL HIGH (ref 150–400)
RDW: 20.3 % — ABNORMAL HIGH (ref 11.5–15.5)

## 2011-07-08 MED ORDER — SODIUM CHLORIDE 0.9 % IV SOLN
INTRAVENOUS | Status: DC
  Administered 2011-07-08: 13:00:00 via INTRAVENOUS

## 2011-07-08 MED ORDER — ALBUTEROL SULFATE (5 MG/ML) 0.5% IN NEBU
10.0000 mg | INHALATION_SOLUTION | Freq: Once | RESPIRATORY_TRACT | Status: AC
  Administered 2011-07-08: 10 mg via RESPIRATORY_TRACT

## 2011-07-08 MED ORDER — IPRATROPIUM BROMIDE 0.02 % IN SOLN
RESPIRATORY_TRACT | Status: AC
  Filled 2011-07-08: qty 2.5

## 2011-07-08 MED ORDER — PREDNISONE 20 MG PO TABS
60.0000 mg | ORAL_TABLET | Freq: Once | ORAL | Status: AC
  Administered 2011-07-08: 60 mg via ORAL
  Filled 2011-07-08: qty 3

## 2011-07-08 MED ORDER — IPRATROPIUM BROMIDE 0.02 % IN SOLN
1.0000 mg | Freq: Once | RESPIRATORY_TRACT | Status: AC
  Administered 2011-07-08: 1 mg via RESPIRATORY_TRACT

## 2011-07-08 MED ORDER — IPRATROPIUM BROMIDE 0.02 % IN SOLN
0.5000 mg | Freq: Once | RESPIRATORY_TRACT | Status: DC
  Filled 2011-07-08: qty 2.5

## 2011-07-08 MED ORDER — ALBUTEROL SULFATE (5 MG/ML) 0.5% IN NEBU
2.5000 mg | INHALATION_SOLUTION | Freq: Once | RESPIRATORY_TRACT | Status: DC
  Filled 2011-07-08: qty 0.5

## 2011-07-08 MED ORDER — ALBUTEROL SULFATE (5 MG/ML) 0.5% IN NEBU
INHALATION_SOLUTION | RESPIRATORY_TRACT | Status: AC
  Filled 2011-07-08: qty 1.5

## 2011-07-08 NOTE — ED Provider Notes (Signed)
Scribed for No att. providers found, the patient was seen in room APA10/APA10 . This chart was scribed by Ellie Lunch. This patient's care was started at 12:12 PM.   CSN: [id number] Arrival date & time: 07/08/2011 11:44 AM  Chief Complaint  Patient presents with  . Shortness of Breath   HPI Patient was seen at 12:10. [name] is a 77 y.o. male with a history of COPD who presents to the Emergency Department complaining of gradual onset and persistence of constant shortness of breath starting this morning when he woke up about 2 hours ago. Pt denies chest pain or palpitations. Denies fevers, no cough, no abd pain, no back pain, no N/V/D.  Pt states he called his PMD this morning and was told to come to the ED for eval.    Past Medical History  Diagnosis Date  . COPD (chronic obstructive pulmonary disease)   . Anxiety   . Alcohol abuse   . Anxiety   . Generalized headaches   . Hypertension   . AV malformation of GI tract   . Schizoaffective disorder     Past Surgical History  Procedure Date  . No past surgeries    MEDICATIONS:  Previous Medications   ALBUTEROL (VENTOLIN HFA) 108 (90 BASE) MCG/ACT INHALER    Inhale 2 puffs into the lungs every 6 (six) hours as needed. Shortness of breath    HYDROCODONE-ACETAMINOPHEN (NORCO) 5-325 MG PER TABLET    Take 1 tablet by mouth every 6 (six) hours as needed. For pain   LORAZEPAM (ATIVAN) 1 MG TABLET    Take 1-2 mg by mouth 2 (two) times daily as needed. 2 in the  morning and 1 at night   MULTIVITAMIN (THERAGRAN) PER TABLET    Take 1 tablet by mouth daily.       ALLERGIES:  Allergies as of 07/08/2011  . (No Known Allergies)    No family history on file.  History  Substance Use Topics  . Smoking status: Former Smoker    Quit date: 05/07/1969  . Smokeless tobacco: Not on file  . Alcohol Use: 0.0 oz/week     Review of Systems ROS: Statement: All systems negative except as marked or noted in the HPI; Constitutional: Negative  for fever and chills. ; ; Eyes: Negative for eye pain, redness and discharge. ; ; ENMT: Negative for ear pain, hoarseness, nasal congestion, sinus pressure and sore throat. ; ; Cardiovascular: +SOB.  Negative for chest pain, palpitations, diaphoresis, and peripheral edema. ; ; Respiratory: Negative for cough, wheezing and stridor. ; ; Gastrointestinal: Negative for nausea, vomiting, diarrhea and abdominal pain, blood in stool, hematemesis, jaundice and rectal bleeding. . ; ; Genitourinary: Negative for dysuria, flank pain and hematuria. ; ; Musculoskeletal: Negative for back pain and neck pain. Negative for swelling and trauma.; ; Skin: Negative for pruritus, rash, abrasions, blisters, bruising and skin lesion.; ; Neuro: Negative for headache, lightheadedness and neck stiffness. Negative for weakness, altered level of consciousness , altered mental status, extremity weakness, paresthesias, involuntary movement, seizure and syncope.     Physical Exam  BP 111/75  Pulse 102  Temp(Src) 98.3 F (36.8 C) (Oral)  Resp 24  Ht 5\' 5"  (1.651 m)  Wt 130 lb (58.968 kg)  BMI 21.63 kg/m2  SpO2 81%  Physical Exam 1220: Physical examination:  Nursing notes reviewed; Vital signs and O2 SAT reviewed;  Constitutional: Thin, Well hydrated, In no acute distress; Head:  Normocephalic, atraumatic; Eyes: EOMI, PERRL, No scleral icterus;  ENMT: Mouth and pharynx normal, Mucous membranes moist; Neck: Supple, Full range of motion, No lymphadenopathy; Cardiovascular: Regular rate and rhythm, No murmur, rub, or gallop; Respiratory: Breath sounds coarse with exp wheezes bilat, Tachypneic.  Speaking in full sentences.  Chest: Nontender, Movement normal; Abdomen: Soft, Nontender, Nondistended, Normal bowel sounds; Extremities: Pulses normal, No tenderness, No edema, No calf edema or asymmetry.; Neuro: AA&Ox3, Major CN grossly intact.  No gross focal motor or sensory deficits in extremities.; Skin: Color normal, Warm, Dry   ED  Course  Procedures  MDM MDM Reviewed: nursing note, vitals and previous chart Reviewed previous: ECG Interpretation: labs, ECG and x-ray    Date: 07/08/2011  Rate: 87  Rhythm: normal sinus rhythm  QRS Axis: normal  Intervals: normal  ST/T Wave abnormalities: normal  Conduction Disutrbances:none  Narrative Interpretation:   Old EKG Reviewed: unchanged, no signif changes from previous EKG dated 05/08/11.  Results for orders placed during the hospital encounter of 07/08/11  CBC      Component Value Range   WBC 3.1 (*) 4.0 - 10.5 (K/uL)   RBC 4.08 (*) 4.22 - 5.81 (MIL/uL)   Hemoglobin 8.3 (*) 13.0 - 17.0 (g/dL)   HCT 96.2 (*) 95.2 - 52.0 (%)   MCV 71.6 (*) 78.0 - 100.0 (fL)   MCH 20.3 (*) 26.0 - 34.0 (pg)   MCHC 28.4 (*) 30.0 - 36.0 (g/dL)   RDW 84.1 (*) 32.4 - 15.5 (%)   Platelets 438 (*) 150 - 400 (K/uL)  DIFFERENTIAL      Component Value Range   Neutrophils Relative 48  43 - 77 (%)   Neutro Abs 1.5 (*) 1.7 - 7.7 (K/uL)   Lymphocytes Relative 28  12 - 46 (%)   Lymphs Abs 0.9  0.7 - 4.0 (K/uL)   Monocytes Relative 14 (*) 3 - 12 (%)   Monocytes Absolute 0.4  0.1 - 1.0 (K/uL)   Eosinophils Relative 9 (*) 0 - 5 (%)   Eosinophils Absolute 0.3  0.0 - 0.7 (K/uL)   Basophils Relative 2 (*) 0 - 1 (%)   Basophils Absolute 0.1  0.0 - 0.1 (K/uL)  BASIC METABOLIC PANEL      Component Value Range   Sodium 142  135 - 145 (mEq/L)   Potassium 4.3  3.5 - 5.1 (mEq/L)   Chloride 108  96 - 112 (mEq/L)   CO2 30  19 - 32 (mEq/L)   Glucose, Bld 102 (*) 70 - 99 (mg/dL)   BUN 9  6 - 23 (mg/dL)   Creatinine, Ser 4.01  0.50 - 1.35 (mg/dL)   Calcium 9.2  8.4 - 02.7 (mg/dL)   GFR calc non Af Amer >60  >60 (mL/min)   GFR calc Af Amer >60  >60 (mL/min)  POCT I-STAT TROPONIN I      Component Value Range   Troponin i, poc 0.00  0.00 - 0.08 (ng/mL)   Comment 3            Dg Chest Portable 1 View  07/08/2011  *RADIOLOGY REPORT*  Clinical Data: Cough and shortness of breath.  PORTABLE CHEST - 1  VIEW  Comparison: 05/04/2011  Findings: Stable severe COPD.  No infiltrate, edema, pneumothorax or pleural effusion.  Heart size is stable and within normal limits.  IMPRESSION: Stable severe COPD.  Original Report Authenticated By: Reola Calkins, M.D.    Medications given in ED:  ipratropium (ATROVENT) 0.02 % nebulizer solution 1 mg (1 mg Nebulization Given 07/08/11 1227)  albuterol (PROVENTIL) (5 MG/ML) 0.5% nebulizer solution 10 mg (10 mg Nebulization Given 07/08/11 1227)  predniSONE (DELTASONE) tablet 60 mg (60 mg Oral Given 07/08/11 1301)    1430:  Pt states he "feels better" after continuous neb and prednisone PO.  Sats initially increased to low-mid 90's, but then dropped to 80's on R/A while sitting on stretcher.  ED RN, ED Tech and myself at bedside.  Dx testing d/w pt, as well as my recommendations for admission.  Questions answered.  Verb understanding.  States he "doesn't care" and "feels fine" and wants to go home right now.  States he will "show you all" that he "can walk around just fine without a problem."  Pt ambulated with ED Tech, Sats remaining 87-89% R/A, appeared SOB but denies SOB.  States he is "getting my black ass out of here right now" and proceeded to start to get dressed.  ED RN and I continued to recommend admission for further evaluation of pt's SOB; we offered to call pt's PMD as well as given him any meds for poss etoh withdrawal.  Pt continues to refuse admission.  I encouraged pt to stay, continues to refuse.  Pt makes his own medical decisions.  Risks of AMA explained to pt, including, but not limited to:  stroke, heart attack, cardiac arrythmia ("irregular heart rate/beat"), "passing out," temporary and/or permanent disability, death.  Pt verb understanding and continues to refuse admission, understanding the consequences of his decision.  I encouraged pt to follow up with his PMD tomorrow and return to the ED immediately if symptoms return, or for any other concerns.  Pt  verb understanding, agreeable.  Pt finished getting dressed, signed AMA forms and walked down the hallway with steady gait to the ED exit whistling.     I personally performed the services described in this documentation, which was scribed in my presence. The recorded information has been reviewed and considered. [facility] M

## 2011-07-08 NOTE — ED Notes (Signed)
C/o shortness of breath onset upon waking this morning; c/o pain to left side of neck anteriorly

## 2011-07-08 NOTE — ED Notes (Signed)
MD made aware of pt's O2 sat. Pt became argumentative with staff, insisting to leave. MD called to bedside and recommends pt to be admitted to hospital due to low O2 sat. Pt refuses to be admitted. Explained risks to leaving AMA. Pt continues to refuse stating he has to go. MD offered medications to help calm pt down and make more comfortable, but pt refused. Pt proceeded to pull off monitor cords and take off nasal cannula. NT walked with pt with portable O2 and sats remained between 87-89% on RA. Pt audibly wheezing, but refused to stay. Pt signed AMA papers.

## 2011-07-09 ENCOUNTER — Encounter (HOSPITAL_COMMUNITY): Payer: Self-pay | Admitting: Emergency Medicine

## 2011-07-20 LAB — COMPREHENSIVE METABOLIC PANEL
ALT: 24
AST: 37
AST: 40 — ABNORMAL HIGH
Albumin: 3 — ABNORMAL LOW
Albumin: 3.3 — ABNORMAL LOW
Calcium: 8.3 — ABNORMAL LOW
Chloride: 99
Creatinine, Ser: 1
Creatinine, Ser: 1.14
GFR calc Af Amer: >60
GFR calc Af Amer: >60
Potassium: 3.7
Sodium: 137
Total Bilirubin: 0.9
Total Protein: 5.9 — ABNORMAL LOW
Total Protein: 6.7

## 2011-07-20 LAB — APTT: aPTT: 32

## 2011-07-20 LAB — CROSSMATCH
ABO/RH(D): O POS
Antibody Screen: NEGATIVE

## 2011-07-20 LAB — RAPID URINE DRUG SCREEN, HOSP PERFORMED
Amphetamines: NOT DETECTED
Barbiturates: NOT DETECTED
Benzodiazepines: POSITIVE — AB
Cocaine: NOT DETECTED

## 2011-07-20 LAB — URINALYSIS, ROUTINE W REFLEX MICROSCOPIC
Glucose, UA: NEGATIVE
Ketones, ur: 15 — AB
Nitrite: NEGATIVE
Specific Gravity, Urine: 1.015
pH: 5.5

## 2011-07-20 LAB — PHOSPHORUS: Phosphorus: 3.1

## 2011-07-20 LAB — CBC
MCHC: 32.5
MCV: 62.5 — ABNORMAL LOW
MCV: 75.3 — ABNORMAL LOW
Platelets: 575 — ABNORMAL HIGH
Platelets: 818 — ABNORMAL HIGH
RDW: 24.6 — ABNORMAL HIGH
WBC: 5.7

## 2011-07-20 LAB — DIFFERENTIAL
Band Neutrophils: 0
Basophils Relative: 0
Blasts: 0
Eosinophils Relative: 1
Lymphocytes Relative: 7 — ABNORMAL LOW
Lymphocytes Relative: 9 — ABNORMAL LOW
Lymphs Abs: 0.8
Metamyelocytes Relative: 0
Monocytes Absolute: 1.2 — ABNORMAL HIGH
Monocytes Relative: 12
Monocytes Relative: 15 — ABNORMAL HIGH

## 2011-07-20 LAB — URINE CULTURE: Special Requests: NEGATIVE

## 2011-07-20 LAB — PROTIME-INR
INR: 1.1
Prothrombin Time: 14.1

## 2011-07-20 LAB — B-NATRIURETIC PEPTIDE (CONVERTED LAB): Pro B Natriuretic peptide (BNP): 326 — ABNORMAL HIGH

## 2011-07-20 LAB — ETHANOL: Alcohol, Ethyl (B): <5

## 2011-07-20 LAB — ABO/RH: ABO/RH(D): O POS

## 2011-07-20 LAB — TSH: TSH: 3.006

## 2011-07-27 ENCOUNTER — Encounter (HOSPITAL_COMMUNITY): Payer: Self-pay | Admitting: Emergency Medicine

## 2011-07-27 ENCOUNTER — Emergency Department (HOSPITAL_COMMUNITY)
Admission: EM | Admit: 2011-07-27 | Discharge: 2011-07-27 | Discharge status: Against Medical Advice | Payer: Medicare Other | Attending: Emergency Medicine | Admitting: Emergency Medicine

## 2011-07-27 DIAGNOSIS — X500XXA Overexertion from strenuous movement or load, initial encounter: Secondary | ICD-10-CM | POA: Insufficient documentation

## 2011-07-27 DIAGNOSIS — M25559 Pain in unspecified hip: Secondary | ICD-10-CM | POA: Insufficient documentation

## 2011-07-27 DIAGNOSIS — Z5320 Procedure and treatment not carried out because of patient's decision for unspecified reasons: Secondary | ICD-10-CM | POA: Insufficient documentation

## 2011-07-27 NOTE — ED Notes (Signed)
Pt c/o left hip pain since lifting a lawnmower on Sunday.

## 2011-07-27 NOTE — ED Notes (Signed)
Pt states he is too cold to stay any longer.  Multiple layers of blankets given, attempted to have patient stay but patient refused.

## 2011-07-28 LAB — CROSSMATCH

## 2011-08-01 LAB — CROSSMATCH: Antibody Screen: NEGATIVE

## 2011-08-06 ENCOUNTER — Emergency Department (HOSPITAL_COMMUNITY): Payer: Medicare Other

## 2011-08-06 ENCOUNTER — Encounter (HOSPITAL_COMMUNITY): Payer: Self-pay | Admitting: *Deleted

## 2011-08-06 ENCOUNTER — Other Ambulatory Visit: Payer: Self-pay

## 2011-08-06 ENCOUNTER — Emergency Department (HOSPITAL_COMMUNITY)
Admission: EM | Admit: 2011-08-06 | Discharge: 2011-08-07 | Disposition: A | Discharge status: Home / Self Care | Payer: Medicare Other | Attending: Emergency Medicine | Admitting: Emergency Medicine

## 2011-08-06 DIAGNOSIS — I498 Other specified cardiac arrhythmias: Secondary | ICD-10-CM | POA: Insufficient documentation

## 2011-08-06 DIAGNOSIS — R062 Wheezing: Secondary | ICD-10-CM | POA: Insufficient documentation

## 2011-08-06 DIAGNOSIS — F411 Generalized anxiety disorder: Secondary | ICD-10-CM | POA: Insufficient documentation

## 2011-08-06 DIAGNOSIS — J4489 Other specified chronic obstructive pulmonary disease: Secondary | ICD-10-CM | POA: Insufficient documentation

## 2011-08-06 DIAGNOSIS — I10 Essential (primary) hypertension: Secondary | ICD-10-CM | POA: Insufficient documentation

## 2011-08-06 DIAGNOSIS — Z79899 Other long term (current) drug therapy: Secondary | ICD-10-CM | POA: Insufficient documentation

## 2011-08-06 DIAGNOSIS — J449 Chronic obstructive pulmonary disease, unspecified: Secondary | ICD-10-CM | POA: Insufficient documentation

## 2011-08-06 DIAGNOSIS — R0602 Shortness of breath: Secondary | ICD-10-CM | POA: Insufficient documentation

## 2011-08-06 DIAGNOSIS — Z87891 Personal history of nicotine dependence: Secondary | ICD-10-CM | POA: Insufficient documentation

## 2011-08-06 LAB — CBC
HCT: 34.8 % — ABNORMAL LOW (ref 39.0–52.0)
Hemoglobin: 10.7 g/dL — ABNORMAL LOW (ref 13.0–17.0)
Platelets: 479 10*3/uL — ABNORMAL HIGH (ref 150–400)
RBC: 4.27 MIL/uL (ref 4.22–5.81)
WBC: 2.5 10*3/uL — ABNORMAL LOW (ref 4.0–10.5)

## 2011-08-06 LAB — BASIC METABOLIC PANEL
CO2: 25 mEq/L (ref 19–32)
Chloride: 109 mEq/L (ref 96–112)
Creatinine, Ser: 0.82 mg/dL (ref 0.50–1.35)

## 2011-08-06 MED ORDER — ALBUTEROL SULFATE (5 MG/ML) 0.5% IN NEBU
2.5000 mg | INHALATION_SOLUTION | Freq: Once | RESPIRATORY_TRACT | Status: AC
  Administered 2011-08-06: 2.5 mg via RESPIRATORY_TRACT
  Filled 2011-08-06: qty 0.5

## 2011-08-06 MED ORDER — PIPERACILLIN-TAZOBACTAM 3.375 G IVPB: 3.3750 g | Freq: Once | INTRAVENOUS | Status: DC

## 2011-08-06 NOTE — Progress Notes (Signed)
Pt states he has had 1-3 beers today, also that he has been spraying for bugs as might be the cause of his wheezing. He has no bilateral wheezes at this time, but does appear to be intoxicated and /or mental disorder as he states he is living.

## 2011-08-07 MED ORDER — ALBUTEROL SULFATE HFA 108 (90 BASE) MCG/ACT IN AERS
2.0000 | INHALATION_SPRAY | Freq: Four times a day (QID) | RESPIRATORY_TRACT | Status: DC
  Administered 2011-08-07: 2 via RESPIRATORY_TRACT
  Filled 2011-08-07: qty 6.7

## 2011-08-07 NOTE — ED Provider Notes (Signed)
History     CSN: [id number] Arrival date & time: 08/06/2011 10:29 PM  Chief Complaint  Patient presents with  . Shortness of Breath    (Consider location/radiation/quality/duration/timing/severity/associated sxs/prior treatment) HPI Comments: Seen 2311.  Patient is a 77 y.o. male presenting with shortness of breath. The history is provided by the patient.  Shortness of Breath  The current episode started today (Patient with h/o COPDwho is out of his inhaler. Developed wheezing earlier in the evening and has had wheezing and shortness of breath.). The problem has been unchanged. The problem is mild. The symptoms are relieved by nothing. The symptoms are aggravated by activity. Associated symptoms include shortness of breath and wheezing. Pertinent negatives include no chest pain, no orthopnea, no fever, no sore throat and no cough. He has not inhaled smoke recently. He has had intermittent steroid use. He has had prior hospitalizations. He has had no prior ICU admissions. He has had no prior intubations. His past medical history is significant for past wheezing. Urine output has been normal. The last void occurred less than 6 hours ago. There were no sick contacts. He has received no recent medical care.    Past Medical History  Diagnosis Date  . COPD (chronic obstructive pulmonary disease)   . Anxiety   . Alcohol abuse   . Anxiety   . Generalized headaches   . Hypertension   . AV malformation of GI tract   . Schizoaffective disorder     Past Surgical History  Procedure Date  . No past surgeries     History reviewed. No pertinent family history.  History  Substance Use Topics  . Smoking status: Former Smoker    Quit date: 05/07/1969  . Smokeless tobacco: Not on file  . Alcohol Use: 0.0 oz/week      Review of Systems  Constitutional: Negative for fever.  HENT: Negative for sore throat.   Respiratory: Positive for shortness of breath and wheezing. Negative for cough.     Cardiovascular: Negative for chest pain and orthopnea.  All other systems reviewed and are negative.    Allergies  Review of patient's allergies indicates no known allergies.  Home Medications   Current Outpatient Rx  Name Route Sig Dispense Refill  . ALBUTEROL SULFATE HFA 108 (90 BASE) MCG/ACT IN AERS Inhalation Inhale 2 puffs into the lungs every 6 (six) hours as needed. Shortness of breath     . HYDROCODONE-ACETAMINOPHEN 5-325 MG PO TABS Oral Take 1 tablet by mouth every 6 (six) hours as needed. For pain    . LORAZEPAM 1 MG PO TABS Oral Take 1-2 mg by mouth 2 (two) times daily as needed. 2 in the  morning and 1 at night    . MULTIVITAMINS PO TABS Oral Take 1 tablet by mouth daily.        BP 131/68  Pulse 62  Temp(Src) 98.1 F (36.7 C) (Oral)  Resp 20  Ht 5\' 5"  (1.651 m)  Wt 130 lb (58.968 kg)  BMI 21.63 kg/m2  SpO2 95%  Physical Exam  Nursing note and vitals reviewed. Constitutional: He is oriented to person, place, and time. He appears well-developed and well-nourished. He appears distressed.  HENT:  Head: Normocephalic and atraumatic.  Eyes: EOM are normal.  Neck: Normal range of motion. Neck supple.  Cardiovascular: Normal rate, normal heart sounds and intact distal pulses.   Pulmonary/Chest: Effort normal. He has wheezes. He exhibits no tenderness.  Abdominal: Soft.  Musculoskeletal: Normal range of motion.  Neurological:  He is alert and oriented to person, place, and time.  Skin: Skin is warm and dry.    ED Course  Procedures (including critical care time) Results for orders placed during the hospital encounter of 08/06/11  CBC      Component Value Range   WBC 2.5 (*) 4.0 - 10.5 (K/uL)   RBC 4.27  4.22 - 5.81 (MIL/uL)   Hemoglobin 10.7 (*) 13.0 - 17.0 (g/dL)   HCT 16.1 (*) 09.6 - 52.0 (%)   MCV 81.5  78.0 - 100.0 (fL)   MCH 25.1 (*) 26.0 - 34.0 (pg)   MCHC 30.7  30.0 - 36.0 (g/dL)   Platelets 045 (*) 409 - 400 (K/uL)  BASIC METABOLIC PANEL       Component Value Range   Sodium 145  135 - 145 (mEq/L)   Potassium 3.0 (*) 3.5 - 5.1 (mEq/L)   Chloride 109  96 - 112 (mEq/L)   CO2 25  19 - 32 (mEq/L)   Glucose, Bld 78  70 - 99 (mg/dL)   BUN 5 (*) 6 - 23 (mg/dL)   Creatinine, Ser 8.11  0.50 - 1.35 (mg/dL)   Calcium 9.2  8.4 - 91.4 (mg/dL)   GFR calc non Af Amer 82 (*) >90 (mL/min)   GFR calc Af Amer >90  >90 (mL/min)    Dg Chest Portable 1 View  08/06/2011  *RADIOLOGY REPORT*  Clinical Data: Shortness of breath, altered mental status.  PORTABLE CHEST - 1 VIEW  Comparison: 07/08/2011  Findings: The right lung appears hyperinflated/more lucent as compared to the left.  This may be exaggerated by technique.  No focal areas of consolidation identified.  Stable cardiomediastinal contours.  No acute osseous abnormality.  IMPRESSION: Hyperinflation of the right lung as compared to the left, may be exaggerated by technique.  Recommend PA and lateral radiographs the patient tolerate.  Original Report Authenticated By: Waneta Martins, M.D.   Date: 08/07/2011  2247  Rate55  Rhythm: sinus bradycardia  QRS Axis: normal  Intervals: normal  ST/T Wave abnormalities: nonspecific ST changes  Conduction Disutrbances:none  Narrative Interpretation:   Old EKG Reviewed: unchanged from EKG 05/08/11     Patient with h/o COPD out of his inhaler. He developed shortness of breath and wheezing tonght. O2 sats on RA range between 93-96%. He received nebulized treatment with resolution of wheezing. Discharged with an inhaler. He states the pharmacy will not refill his inhaler until 08/10/11. Pt feels improved after observation and/or treatment in ED.Pt stable in ED with no significant deterioration in condition.     MDM Reviewed: nursing note and vitals Interpretation: labs, ECG and x-ray             Nicoletta Dress. Colon Branch, MD 08/07/11 7829

## 2011-08-08 ENCOUNTER — Emergency Department (HOSPITAL_COMMUNITY)
Admission: EM | Admit: 2011-08-08 | Discharge: 2011-08-08 | Disposition: A | Discharge status: Home / Self Care | Payer: Medicare Other | Attending: Emergency Medicine | Admitting: Emergency Medicine

## 2011-08-08 ENCOUNTER — Encounter (HOSPITAL_COMMUNITY): Payer: Self-pay | Admitting: Emergency Medicine

## 2011-08-08 ENCOUNTER — Emergency Department (HOSPITAL_COMMUNITY): Payer: Medicare Other

## 2011-08-08 DIAGNOSIS — Z87891 Personal history of nicotine dependence: Secondary | ICD-10-CM | POA: Insufficient documentation

## 2011-08-08 DIAGNOSIS — E876 Hypokalemia: Secondary | ICD-10-CM | POA: Insufficient documentation

## 2011-08-08 DIAGNOSIS — Z79899 Other long term (current) drug therapy: Secondary | ICD-10-CM | POA: Insufficient documentation

## 2011-08-08 DIAGNOSIS — R110 Nausea: Secondary | ICD-10-CM | POA: Insufficient documentation

## 2011-08-08 DIAGNOSIS — M545 Low back pain, unspecified: Secondary | ICD-10-CM | POA: Insufficient documentation

## 2011-08-08 MED ORDER — POTASSIUM CHLORIDE CRYS ER 20 MEQ PO TBCR: 20.0000 meq | EXTENDED_RELEASE_TABLET | Freq: Every day | ORAL | Status: DC

## 2011-08-08 MED ORDER — POTASSIUM CHLORIDE CRYS ER 20 MEQ PO TBCR
40.0000 meq | EXTENDED_RELEASE_TABLET | Freq: Once | ORAL | Status: AC
  Administered 2011-08-08: 40 meq via ORAL
  Filled 2011-08-08: qty 2

## 2011-08-08 NOTE — ED Provider Notes (Signed)
Scribed for [name], MD, the patient was seen in room APA08/APA08 . This chart was scribed by Ellie Lunch. This patient's care was started at 3:30 PM.   CSN: [id number] Arrival date & time: 08/08/2011  2:33 PM  Chief Complaint  Patient presents with  . Nausea  . Back Pain    (Consider location/radiation/quality/duration/timing/severity/associated sxs/prior treatment) HPI [name] is a 77 y.o. male who presents to the Emergency Department complaining of mild weakness starting this morning. Weakness is described as constant and aggravated by exertion. Pt reports he felt fine yesterday; woke up this morning feeling weak and complaining that his "nerves" were bothering him. Pt was seen in ED 2 days ago 08/06/11 for SOB and was discharged with inhaler.  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  Pt denies cough, fever, and vomiting.    Past Medical History  Diagnosis Date  . COPD (chronic obstructive pulmonary disease)   . Anxiety   . Alcohol abuse   . Anxiety   . Generalized headaches   . Hypertension   . AV malformation of GI tract   . Schizoaffective disorder     Past Surgical History  Procedure Date  . No past surgeries     History reviewed. No pertinent family history.  History  Substance Use Topics  . Smoking status: Former Smoker    Quit date: 05/07/1969  . Smokeless tobacco: Not on file  . Alcohol Use: 0.0 oz/week    Review of Systems 10 Systems reviewed and are negative for acute change except as noted in the HPI.  Allergies  Review of patient's allergies indicates not on file.  Home Medications   Current Outpatient Rx  Name Route Sig Dispense Refill  . ALBUTEROL SULFATE HFA 108 (90 BASE) MCG/ACT IN AERS Inhalation Inhale 2 puffs into the lungs every 6 (six) hours as needed. Shortness of breath     . DIAZEPAM 5 MG PO TABS Oral Take 5 mg by mouth 2 (two) times daily as needed. nerves     .  HYDROCODONE-ACETAMINOPHEN 5-325 MG PO TABS Oral Take 1 tablet by mouth every 6 (six) hours as needed. For pain    . MULTIVITAMINS PO TABS Oral Take 1 tablet by mouth daily.      Marland Kitchen LORAZEPAM 1 MG PO TABS Oral Take 1-2 mg by mouth 2 (two) times daily as needed. 2 in the  morning and 1 at night      BP 150/84  Pulse 57  Temp(Src) 97.2 F (36.2 C) (Oral)  Resp 19  Ht 5\' 5"  (1.651 m)  Wt 130 lb (58.968 kg)  BMI 21.63 kg/m2  SpO2 95%  Physical Exam  Nursing note and vitals reviewed. Constitutional: He is oriented to person, place, and time. He appears well-developed.  HENT:  Head: Normocephalic and atraumatic.  Eyes: Conjunctivae and EOM are normal. No scleral icterus.  Neck: Neck supple. No thyromegaly present.  Cardiovascular: Normal rate and regular rhythm.  Exam reveals no gallop and no friction rub.   No murmur heard. Pulmonary/Chest: No stridor. He has no wheezes. He has no rales. He exhibits no tenderness.  Abdominal: He exhibits no distension. There is no tenderness. There is no rebound.       Left flank tenderness  Musculoskeletal: Normal range of motion. He exhibits no edema.  Lymphadenopathy:    He has no cervical adenopathy.  Neurological: He is oriented to person, place, and time. Coordination  normal.  Skin: No rash noted. No erythema.  Psychiatric: He has a normal mood and affect.   Procedures (including critical care time)  OTHER DATA REVIEWED: Nursing notes, vital signs, and past medical records reviewed.  DIAGNOSTIC STUDIES: Oxygen Saturation is 95% on room air, adequate by my interpretation.    LABS / RADIOLOGY:  Dg Chest 2 View  08/08/2011  *RADIOLOGY REPORT*  Clinical Data: Former smoker, weakness  CHEST - 2 VIEW  Comparison: 08/06/2011  Findings: Upper-normal size of cardiac silhouette. Mediastinal contours and pulmonary vascularity normal. Emphysematous and minimal chronic bronchitic changes. No pulmonary infiltrate, pleural effusion, or pneumothorax. Bones  appear diffusely demineralized. Vague area of increased attenuation in the right mid lung, likely related to superimposition of costal and vascular markings. Minimal scarring at lingula and right upper lobe.  IMPRESSION: Emphysematous and bronchitic changes with minimal scarring. No acute abnormalities.  Original Report Authenticated By: Lollie Marrow, M.D.   Dg Chest Portable 1 View  08/06/2011  *RADIOLOGY REPORT*  Clinical Data: Shortness of breath, altered mental status.  PORTABLE CHEST - 1 VIEW  Comparison: 07/08/2011  Findings: The right lung appears hyperinflated/more lucent as compared to the left.  This may be exaggerated by technique.  No focal areas of consolidation identified.  Stable cardiomediastinal contours.  No acute osseous abnormality.  IMPRESSION: Hyperinflation of the right lung as compared to the left, may be exaggerated by technique.  Recommend PA and lateral radiographs the patient tolerate.  Original Report Authenticated By: Waneta Martins, M.D.    ED COURSE / COORDINATION OF CARE: Weakness,  Hypokalemia,  copd  MEDICATIONS GIVEN IN THE E.D.  Medications  potassium chloride SA (K-DUR,KLOR-CON) CR tablet 40 mEq    Discharge New Prescriptions   POTASSIUM CHLORIDE SA (K-DUR,KLOR-CON) 20 MEQ TABLET    Take 1 tablet (20 mEq total) by mouth daily.     SCRIBE ATTESTATION: The chart was scribed for me under my direct supervision.  I personally performed the history, physical, and medical decision making and all procedures in the evaluation of this patient.[name], MD 08/08/11 [phone]

## 2011-08-08 NOTE — ED Notes (Signed)
Nausea since this am. Lower back pain x 2-3 wks. Denies gu sx's. Denies v/d. Nad. Mm wet.

## 2011-10-08 ENCOUNTER — Emergency Department (HOSPITAL_COMMUNITY): Payer: Medicare Other

## 2011-10-08 ENCOUNTER — Other Ambulatory Visit: Payer: Self-pay

## 2011-10-08 ENCOUNTER — Emergency Department (HOSPITAL_COMMUNITY)
Admission: EM | Admit: 2011-10-08 | Discharge: 2011-10-08 | Disposition: A | Discharge status: Home / Self Care | Payer: Medicare Other | Attending: Emergency Medicine | Admitting: Emergency Medicine

## 2011-10-08 ENCOUNTER — Encounter (HOSPITAL_COMMUNITY): Payer: Self-pay | Admitting: *Deleted

## 2011-10-08 DIAGNOSIS — R079 Chest pain, unspecified: Secondary | ICD-10-CM | POA: Insufficient documentation

## 2011-10-08 DIAGNOSIS — I10 Essential (primary) hypertension: Secondary | ICD-10-CM | POA: Insufficient documentation

## 2011-10-08 DIAGNOSIS — R002 Palpitations: Secondary | ICD-10-CM

## 2011-10-08 DIAGNOSIS — R0602 Shortness of breath: Secondary | ICD-10-CM | POA: Insufficient documentation

## 2011-10-08 DIAGNOSIS — J4489 Other specified chronic obstructive pulmonary disease: Secondary | ICD-10-CM | POA: Insufficient documentation

## 2011-10-08 DIAGNOSIS — Z87891 Personal history of nicotine dependence: Secondary | ICD-10-CM | POA: Insufficient documentation

## 2011-10-08 DIAGNOSIS — R05 Cough: Secondary | ICD-10-CM

## 2011-10-08 DIAGNOSIS — R059 Cough, unspecified: Secondary | ICD-10-CM | POA: Insufficient documentation

## 2011-10-08 DIAGNOSIS — F411 Generalized anxiety disorder: Secondary | ICD-10-CM | POA: Insufficient documentation

## 2011-10-08 DIAGNOSIS — F259 Schizoaffective disorder, unspecified: Secondary | ICD-10-CM | POA: Insufficient documentation

## 2011-10-08 DIAGNOSIS — J449 Chronic obstructive pulmonary disease, unspecified: Secondary | ICD-10-CM | POA: Insufficient documentation

## 2011-10-08 DIAGNOSIS — K5520 Angiodysplasia of colon without hemorrhage: Secondary | ICD-10-CM | POA: Insufficient documentation

## 2011-10-08 LAB — DIFFERENTIAL
Basophils Absolute: 0 10*3/uL (ref 0.0–0.1)
Eosinophils Relative: 0 % (ref 0–5)
Lymphocytes Relative: 8 % — ABNORMAL LOW (ref 12–46)
Lymphs Abs: 0.4 10*3/uL — ABNORMAL LOW (ref 0.7–4.0)
Monocytes Absolute: 0.5 10*3/uL (ref 0.1–1.0)

## 2011-10-08 LAB — CBC
HCT: 37.9 % — ABNORMAL LOW (ref 39.0–52.0)
MCV: 88.1 fL (ref 78.0–100.0)
RDW: 20.4 % — ABNORMAL HIGH (ref 11.5–15.5)
WBC: 4.4 10*3/uL (ref 4.0–10.5)

## 2011-10-08 LAB — BASIC METABOLIC PANEL
CO2: 26 mEq/L (ref 19–32)
Calcium: 9.6 mg/dL (ref 8.4–10.5)
Creatinine, Ser: 0.83 mg/dL (ref 0.50–1.35)
Glucose, Bld: 90 mg/dL (ref 70–99)

## 2011-10-08 MED ORDER — ALBUTEROL SULFATE (5 MG/ML) 0.5% IN NEBU
2.5000 mg | INHALATION_SOLUTION | Freq: Once | RESPIRATORY_TRACT | Status: AC
  Administered 2011-10-08: 2.5 mg via RESPIRATORY_TRACT
  Filled 2011-10-08: qty 0.5

## 2011-10-08 NOTE — ED Notes (Signed)
Pt reports feeling like "heart was racing" earlier.  States he believes it has slowed down now.  Pt also reporting cough all day.  Reports cough was productive on occasion.  Denies any pain or SOB at this time.  No distress noted. Lung sounds diminished in lower fields.

## 2011-10-08 NOTE — ED Notes (Signed)
Pt ambulated around department independently.  No complaints of pain or SOB.  No dizziness.  No distress noted.

## 2011-10-08 NOTE — ED Notes (Signed)
C/o of heart racing, happened this morning but went away, came back  And with neck pain, also has anxiety and took med today

## 2011-10-08 NOTE — ED Provider Notes (Signed)
History     CSN: [id number] Arrival date & time: 10/08/2011  7:35 PM   First MD Initiated Contact with Patient 10/08/11 1936      Chief Complaint  Patient presents with  . Tachycardia    (Consider location/radiation/quality/duration/timing/severity/associated sxs/prior treatment) HPI Comments: [name] is a 77 y.o. male  With a h/o COPD, anxiety, alcohol abuse, hypertension who presents to the Emergency Department complaining of palpitations that occurred on two occasions today, both associated with coughing. The first occurred this morning and last several seconds associated with shortness of breath. He took an anxiety medication with relief. This evening after drinking a beer he began coughing and again experienced palpitations. He denies chest pain, nausea, vomiting, fever, chills.   Past Medical History  Diagnosis Date  . COPD (chronic obstructive pulmonary disease)   . Anxiety   . Alcohol abuse   . Anxiety   . Generalized headaches   . Hypertension   . AV malformation of GI tract   . Schizoaffective disorder     Past Surgical History  Procedure Date  . No past surgeries     History reviewed. No pertinent family history.  History  Substance Use Topics  . Smoking status: Former Smoker    Quit date: 05/07/1969  . Smokeless tobacco: Not on file  . Alcohol Use: 0.0 oz/week      Review of Systems 10 Systems reviewed and are negative for acute change except as noted in the HPI. Allergies  Review of patient's allergies indicates not on file.  Home Medications   Current Outpatient Rx  Name Route Sig Dispense Refill  . ALBUTEROL SULFATE HFA 108 (90 BASE) MCG/ACT IN AERS Inhalation Inhale 2 puffs into the lungs every 6 (six) hours as needed. Shortness of breath     . DIAZEPAM 5 MG PO TABS Oral Take 5 mg by mouth 2 (two) times daily as needed. nerves     . HYDROCODONE-ACETAMINOPHEN 5-325 MG PO TABS Oral Take 1 tablet by mouth every 6 (six) hours as needed. For  pain    . LORAZEPAM 1 MG PO TABS Oral Take 1-2 mg by mouth 2 (two) times daily as needed. 2 in the  morning and 1 at night    . MULTIVITAMINS PO TABS Oral Take 1 tablet by mouth daily.      Marland Kitchen POTASSIUM CHLORIDE CRYS CR 20 MEQ PO TBCR Oral Take 1 tablet (20 mEq total) by mouth daily. 15 tablet 0    BP 114/62  Pulse 98  Temp(Src) 99.6 F (37.6 C) (Oral)  Resp 17  Ht 5' (1.524 m)  Wt 130 lb (58.968 kg)  BMI 25.39 kg/m2  SpO2 91%  Physical Exam  Nursing note and vitals reviewed. Constitutional: He is oriented to person, place, and time. He appears well-developed and well-nourished.  HENT:  Head: Normocephalic and atraumatic.  Mouth/Throat: Oropharynx is clear and moist.  Eyes: Conjunctivae and EOM are normal. Pupils are equal, round, and reactive to light.  Neck: Normal range of motion.  Cardiovascular: Normal rate, normal heart sounds and intact distal pulses.   Pulmonary/Chest: Effort normal and breath sounds normal.  Abdominal: Soft. Bowel sounds are normal.  Musculoskeletal: Normal range of motion.  Neurological: He is alert and oriented to person, place, and time. He has normal reflexes.  Skin: Skin is warm and dry.    ED Course  Procedures (including critical care time)  Date: 10/08/2011  1945  Rate: 99  Rhythm: normal sinus rhythm  QRS  Axis: normal  Intervals: normal  ST/T Wave abnormalities: normal  Conduction Disutrbances:none  Narrative Interpretation:   Old EKG Reviewed: changes noted c/w 08/06/11 now NSR Results for orders placed during the hospital encounter of 10/08/11  CBC      Component Value Range   WBC 4.4  4.0 - 10.5 (K/uL)   RBC 4.30  4.22 - 5.81 (MIL/uL)   Hemoglobin 12.3 (*) 13.0 - 17.0 (g/dL)   HCT 16.1 (*) 09.6 - 52.0 (%)   MCV 88.1  78.0 - 100.0 (fL)   MCH 28.6  26.0 - 34.0 (pg)   MCHC 32.5  30.0 - 36.0 (g/dL)   RDW 04.5 (*) 40.9 - 15.5 (%)   Platelets 267  150 - 400 (K/uL)  DIFFERENTIAL      Component Value Range   Neutrophils Relative 80  (*) 43 - 77 (%)   Neutro Abs 3.5  1.7 - 7.7 (K/uL)   Lymphocytes Relative 8 (*) 12 - 46 (%)   Lymphs Abs 0.4 (*) 0.7 - 4.0 (K/uL)   Monocytes Relative 12  3 - 12 (%)   Monocytes Absolute 0.5  0.1 - 1.0 (K/uL)   Eosinophils Relative 0  0 - 5 (%)   Eosinophils Absolute 0.0  0.0 - 0.7 (K/uL)   Basophils Relative 1  0 - 1 (%)   Basophils Absolute 0.0  0.0 - 0.1 (K/uL)  BASIC METABOLIC PANEL      Component Value Range   Sodium 136  135 - 145 (mEq/L)   Potassium 3.6  3.5 - 5.1 (mEq/L)   Chloride 101  96 - 112 (mEq/L)   CO2 26  19 - 32 (mEq/L)   Glucose, Bld 90  70 - 99 (mg/dL)   BUN 8  6 - 23 (mg/dL)   Creatinine, Ser 8.11  0.50 - 1.35 (mg/dL)   Calcium 9.6  8.4 - 91.4 (mg/dL)   GFR calc non Af Amer 82 (*) >90 (mL/min)   GFR calc Af Amer >90  >90 (mL/min)  POCT I-STAT TROPONIN I      Component Value Range   Troponin i, poc 0.00  0.00 - 0.08 (ng/mL)   Comment 3           Dg Chest Port 1 View  10/08/2011  *RADIOLOGY REPORT*  Clinical Data: Cough.  Chest pain.  PORTABLE CHEST - 1 VIEW 10/08/2011 2007 hours:  Comparison: Two-view chest x-ray 08/08/2011 and 04/03/2010 [facility].  Findings: Emphysematous changes throughout both lungs with scattered areas of linear scarring, unchanged.  No new pulmonary parenchymal abnormalities.  Cardiac silhouette normal in size. Thoracic aorta mildly tortuous atherosclerotic consistent with age, unchanged.  Prominent central pulmonary arteries, unchanged.  IMPRESSION: COPD/emphysema.  No acute cardiopulmonary disease.  Original Report Authenticated By: Arnell Sieving, M.D.     MDM  Patient with palpitations x 2 today both associated with coughing. Labs, EKG, xray all unremarkable. Patient has ambulated in the hallway with no distress, no marked increase in heart rate.Pt feels improved after observation and/or treatment in ED.Pt stable in ED with no significant deterioration in condition.The patient appears reasonably screened and/or stabilized for  discharge and I doubt any other medical condition or other [facility] requiring further screening, evaluation, or treatment in the ED at this time prior to discharge.  MDM Reviewed: nursing note and vitals Reviewed previous: ECG Interpretation: ECG, x-ray and labs        [employer]. Colon Branch, MD 10/08/11 2137

## 2011-10-26 ENCOUNTER — Encounter (HOSPITAL_COMMUNITY): Payer: Self-pay

## 2011-10-26 ENCOUNTER — Emergency Department (HOSPITAL_COMMUNITY)
Admission: EM | Admit: 2011-10-26 | Discharge: 2011-10-26 | Disposition: A | Discharge status: Home / Self Care | Payer: Medicare Other | Attending: Emergency Medicine | Admitting: Emergency Medicine

## 2011-10-26 DIAGNOSIS — Z79899 Other long term (current) drug therapy: Secondary | ICD-10-CM | POA: Insufficient documentation

## 2011-10-26 DIAGNOSIS — Z760 Encounter for issue of repeat prescription: Secondary | ICD-10-CM | POA: Insufficient documentation

## 2011-10-26 DIAGNOSIS — I10 Essential (primary) hypertension: Secondary | ICD-10-CM | POA: Insufficient documentation

## 2011-10-26 DIAGNOSIS — J449 Chronic obstructive pulmonary disease, unspecified: Secondary | ICD-10-CM | POA: Insufficient documentation

## 2011-10-26 DIAGNOSIS — F411 Generalized anxiety disorder: Secondary | ICD-10-CM | POA: Insufficient documentation

## 2011-10-26 DIAGNOSIS — R0602 Shortness of breath: Secondary | ICD-10-CM | POA: Insufficient documentation

## 2011-10-26 DIAGNOSIS — J4489 Other specified chronic obstructive pulmonary disease: Secondary | ICD-10-CM | POA: Insufficient documentation

## 2011-10-26 NOTE — ED Provider Notes (Signed)
This chart was scribed for [employer]. Colon Branch, MD by [name]. The patient was seen in room APA03/APA03   CSN: [id number]  Arrival date & time 10/26/11  1317   First MD Initiated Contact with Patient 10/26/11 1407      Chief Complaint  Patient presents with  . Shortness of Breath  . Medication Refill    (Consider location/radiation/quality/duration/timing/severity/associated sxs/prior treatment) HPI [name] is a 77 y.o. male who presents to the Emergency Department complaining of SOB. PT said that he needs his inhaler filled. Currently taking symbicort 2-3 times a day when he feels short of breath.Sample symbicort inhaler provided by PCP, Dr. Juanetta Gosling. He is asking we provide same sample until he can see his physician. He has been occasionally experiencing phlegm in his throat which makes him feel short of breath. He uses his symbicort  With improvement.Denies fever, chills, nausea, vomiting.   PCP Dr. Juanetta Gosling Past Medical History  Diagnosis Date  . COPD (chronic obstructive pulmonary disease)   . Anxiety   . Alcohol abuse   . Anxiety   . Generalized headaches   . Hypertension   . AV malformation of GI tract   . Schizoaffective disorder     Past Surgical History  Procedure Date  . No past surgeries     No family history on file.  History  Substance Use Topics  . Smoking status: Former Smoker    Quit date: 05/07/1969  . Smokeless tobacco: Not on file  . Alcohol Use: 0.0 oz/week      Review of Systems 10 Systems reviewed and are negative for acute change except as noted in the HPI.  Allergies  Review of patient's allergies indicates no known allergies.  Home Medications   Current Outpatient Rx  Name Route Sig Dispense Refill  . ALBUTEROL SULFATE HFA 108 (90 BASE) MCG/ACT IN AERS Inhalation Inhale 2 puffs into the lungs every 6 (six) hours as needed. Shortness of breath     . DIAZEPAM 5 MG PO TABS Oral Take 5 mg by mouth 2 (two) times daily as  needed. nerves     . HYDROCODONE-ACETAMINOPHEN 5-325 MG PO TABS Oral Take 1 tablet by mouth every 6 (six) hours as needed. For pain    . LORAZEPAM 1 MG PO TABS Oral Take 1-2 mg by mouth 2 (two) times daily as needed. 2 in the  morning and 1 at night      BP 120/79  Pulse 76  Temp(Src) 97.5 F (36.4 C) (Oral)  Resp 16  SpO2 96%  Physical Exam  Nursing note and vitals reviewed. Constitutional: He is oriented to person, place, and time. He appears well-developed and well-nourished. No distress.  HENT:  Head: Normocephalic and atraumatic.  Eyes: Conjunctivae and EOM are normal.  Neck: Normal range of motion. Neck supple.  Cardiovascular: Normal rate, regular rhythm and normal heart sounds.   Pulmonary/Chest: Effort normal and breath sounds normal.  Abdominal: Soft. Bowel sounds are normal.  Musculoskeletal: Normal range of motion.  Neurological: He is alert and oriented to person, place, and time.  Skin: Skin is warm and dry.  Psychiatric: He has a normal mood and affect. His behavior is normal.    ED Course  Procedures (including critical care time)    MDM  Patient using symbicort for COPD. Advised we do not have samples and offered albuteroll treatment. He declined. He was instructed in correct use of symbicort. He plans to see Dr. Juanetta Gosling tomorrow. Offered Rx for medication.  He refused stating he does not have the money to pay for the copay. Pt stable in ED with no significant deterioration in condition.The patient appears reasonably screened and/or stabilized for discharge and I doubt any other medical condition or other [facility] requiring further screening, evaluation, or treatment in the ED at this time prior to discharge.  I personally performed the services described in this documentation, which was scribed in my presence. The recorded information has been reviewed and considered.   MDM Reviewed: nursing note and vitals          Nicoletta Dress. Colon Branch, MD 10/26/11 2138

## 2011-10-26 NOTE — ED Notes (Signed)
Pt here with complaints of "need my inhaler refilled. I feel short of breath without it" pt speaking complete sentences. No sob with exertion or at rest noted at this time. Pt resp even/nonlabored. A/o x3. Ambulated to room without difficulty.

## 2011-12-15 ENCOUNTER — Other Ambulatory Visit (HOSPITAL_COMMUNITY): Payer: Self-pay | Admitting: Pulmonary Disease

## 2011-12-15 DIAGNOSIS — R0602 Shortness of breath: Secondary | ICD-10-CM

## 2011-12-16 ENCOUNTER — Emergency Department (HOSPITAL_COMMUNITY)
Admission: EM | Admit: 2011-12-16 | Discharge: 2011-12-17 | Disposition: A | Discharge status: Home / Self Care | Payer: PRIVATE HEALTH INSURANCE | Attending: Emergency Medicine | Admitting: Emergency Medicine

## 2011-12-16 ENCOUNTER — Encounter (HOSPITAL_COMMUNITY): Payer: Self-pay

## 2011-12-16 ENCOUNTER — Emergency Department (HOSPITAL_COMMUNITY): Payer: PRIVATE HEALTH INSURANCE

## 2011-12-16 ENCOUNTER — Other Ambulatory Visit: Payer: Self-pay

## 2011-12-16 DIAGNOSIS — Z87891 Personal history of nicotine dependence: Secondary | ICD-10-CM | POA: Insufficient documentation

## 2011-12-16 DIAGNOSIS — J449 Chronic obstructive pulmonary disease, unspecified: Secondary | ICD-10-CM | POA: Insufficient documentation

## 2011-12-16 DIAGNOSIS — R0602 Shortness of breath: Secondary | ICD-10-CM | POA: Insufficient documentation

## 2011-12-16 DIAGNOSIS — Z7982 Long term (current) use of aspirin: Secondary | ICD-10-CM | POA: Insufficient documentation

## 2011-12-16 DIAGNOSIS — F259 Schizoaffective disorder, unspecified: Secondary | ICD-10-CM | POA: Insufficient documentation

## 2011-12-16 DIAGNOSIS — R000 Tachycardia, unspecified: Secondary | ICD-10-CM | POA: Insufficient documentation

## 2011-12-16 DIAGNOSIS — F411 Generalized anxiety disorder: Secondary | ICD-10-CM | POA: Insufficient documentation

## 2011-12-16 DIAGNOSIS — I10 Essential (primary) hypertension: Secondary | ICD-10-CM | POA: Insufficient documentation

## 2011-12-16 DIAGNOSIS — J4489 Other specified chronic obstructive pulmonary disease: Secondary | ICD-10-CM | POA: Insufficient documentation

## 2011-12-16 LAB — BASIC METABOLIC PANEL
CO2: 23 mEq/L (ref 19–32)
Calcium: 9.6 mg/dL (ref 8.4–10.5)
Glucose, Bld: 158 mg/dL — ABNORMAL HIGH (ref 70–99)
Sodium: 134 mEq/L — ABNORMAL LOW (ref 135–145)

## 2011-12-16 LAB — CBC
HCT: 31.5 % — ABNORMAL LOW (ref 39.0–52.0)
MCV: 94.9 fL (ref 78.0–100.0)
Platelets: 282 10*3/uL (ref 150–400)
RBC: 3.32 MIL/uL — ABNORMAL LOW (ref 4.22–5.81)
WBC: 3.9 10*3/uL — ABNORMAL LOW (ref 4.0–10.5)

## 2011-12-16 LAB — POCT I-STAT TROPONIN I

## 2011-12-16 LAB — DIFFERENTIAL
Eosinophils Relative: 1 % (ref 0–5)
Lymphocytes Relative: 22 % (ref 12–46)
Lymphs Abs: 0.9 10*3/uL (ref 0.7–4.0)

## 2011-12-16 MED ORDER — ALBUTEROL SULFATE HFA 108 (90 BASE) MCG/ACT IN AERS
2.0000 | INHALATION_SPRAY | Freq: Four times a day (QID) | RESPIRATORY_TRACT | Status: DC
  Administered 2011-12-17: 2 via RESPIRATORY_TRACT
  Filled 2011-12-16: qty 6.7

## 2011-12-16 NOTE — ED Notes (Signed)
Pt presents with "fast heart rate" and anxiety that started today.

## 2011-12-16 NOTE — ED Notes (Signed)
Pt reports feeling anxious all day. Some sob. Pt denies any new stressors.

## 2011-12-16 NOTE — ED Provider Notes (Signed)
History   This chart was scribed for Shelda Jakes, MD by Melba Coon. The patient was seen in room APA09/APA09 and the patient's care was started at 10:05PM.    CSN: [id number]  Arrival date & time 12/16/11  2058   First MD Initiated Contact with Patient 12/16/11 2124      Chief Complaint  Patient presents with  . Tachycardia  . Anxiety  . Shortness of Breath    (Consider location/radiation/quality/duration/timing/severity/associated sxs/prior treatment) HPI [name] is a 78 y.o. male who presents to the Emergency Department complaining of SOB and tachycardia with an onset this evening. Pt experienced these symptoms at home and lives close to ED, so he tried to walk here. However, somebody he knew saw him walking and decided to drive him to the ED. While at the ED before PE, pt feels better and everything is nml. No CP, n/v/d, abd pain, HA, or edema. No other pertinent medical problems.  Past Medical History  Diagnosis Date  . COPD (chronic obstructive pulmonary disease)   . Anxiety   . Alcohol abuse   . Anxiety   . Generalized headaches   . Hypertension   . AV malformation of GI tract   . Schizoaffective disorder     Past Surgical History  Procedure Date  . No past surgeries     No family history on file.  History  Substance Use Topics  . Smoking status: Former Smoker    Quit date: 05/07/1969  . Smokeless tobacco: Not on file  . Alcohol Use: 0.0 oz/week   Pt lives with son   Review of Systems 10 Systems reviewed and are negative for acute change except as noted in the HPI.  Allergies  Review of patient's allergies indicates no known allergies.  Home Medications   Current Outpatient Rx  Name Route Sig Dispense Refill  . ASPIRIN PO Oral Take 1 tablet by mouth daily.    Marland Kitchen DIAZEPAM 5 MG PO TABS Oral Take 5 mg by mouth 2 (two) times daily as needed. nerves     . TIOTROPIUM BROMIDE MONOHYDRATE 18 MCG IN CAPS Inhalation Place 18 mcg into inhaler  and inhale daily.    Marland Kitchen LORAZEPAM 1 MG PO TABS Oral Take 1-2 mg by mouth 2 (two) times daily as needed. 2 in the  morning and 1 at night      BP 103/89  Pulse 88  Temp(Src) 97.8 F (36.6 C) (Oral)  Resp 20  Ht 5\' 2"  (1.575 m)  Wt 140 lb (63.504 kg)  BMI 25.61 kg/m2  SpO2 93%  Physical Exam  Nursing note and vitals reviewed. Constitutional: He appears well-developed and well-nourished.       Awake, alert, nontoxic appearance.  HENT:  Head: Normocephalic and atraumatic.  Eyes: Conjunctivae and EOM are normal. Pupils are equal, round, and reactive to light. Right eye exhibits no discharge. Left eye exhibits no discharge.  Neck: Normal range of motion. Neck supple.  Cardiovascular: Normal rate, regular rhythm and normal heart sounds.   No murmur heard. Pulmonary/Chest: Effort normal and breath sounds normal. He exhibits no tenderness.  Abdominal: Soft. Bowel sounds are normal. There is no tenderness. There is no rebound.  Musculoskeletal: Normal range of motion. He exhibits no tenderness.       Baseline ROM, no obvious new focal weakness.  Neurological:       Mental status and motor strength appears baseline for patient and situation.  Skin: Skin is warm. No rash noted.  Psychiatric: He has a normal mood and affect.    ED Course  Procedures (including critical care time)  DIAGNOSTIC STUDIES: Oxygen Saturation is 97% on nasal canula, normal by my interpretation.    COORDINATION OF CARE:  10:07PM - EDMD will order CXR  Results for orders placed during the hospital encounter of 12/16/11  CBC      Component Value Range   WBC 3.9 (*) 4.0 - 10.5 (K/uL)   RBC 3.32 (*) 4.22 - 5.81 (MIL/uL)   Hemoglobin 10.7 (*) 13.0 - 17.0 (g/dL)   HCT 95.6 (*) 21.3 - 52.0 (%)   MCV 94.9  78.0 - 100.0 (fL)   MCH 32.2  26.0 - 34.0 (pg)   MCHC 34.0  30.0 - 36.0 (g/dL)   RDW 08.6 (*) 57.8 - 15.5 (%)   Platelets 282  150 - 400 (K/uL)  DIFFERENTIAL      Component Value Range   Neutrophils  Relative 60  43 - 77 (%)   Neutro Abs 2.3  1.7 - 7.7 (K/uL)   Lymphocytes Relative 22  12 - 46 (%)   Lymphs Abs 0.9  0.7 - 4.0 (K/uL)   Monocytes Relative 17 (*) 3 - 12 (%)   Monocytes Absolute 0.6  0.1 - 1.0 (K/uL)   Eosinophils Relative 1  0 - 5 (%)   Eosinophils Absolute 0.0  0.0 - 0.7 (K/uL)   Basophils Relative 1  0 - 1 (%)   Basophils Absolute 0.0  0.0 - 0.1 (K/uL)  BASIC METABOLIC PANEL      Component Value Range   Sodium 134 (*) 135 - 145 (mEq/L)   Potassium 3.7  3.5 - 5.1 (mEq/L)   Chloride 100  96 - 112 (mEq/L)   CO2 23  19 - 32 (mEq/L)   Glucose, Bld 158 (*) 70 - 99 (mg/dL)   BUN 26 (*) 6 - 23 (mg/dL)   Creatinine, Ser 4.69  0.50 - 1.35 (mg/dL)   Calcium 9.6  8.4 - 62.9 (mg/dL)   GFR calc non Af Amer 77 (*) >90 (mL/min)   GFR calc Af Amer 89 (*) >90 (mL/min)  POCT I-STAT TROPONIN I      Component Value Range   Troponin i, poc 0.00  0.00 - 0.08 (ng/mL)   Comment 3             Date: 12/16/2011  Rate: 92  Rhythm: normal sinus rhythm  QRS Axis: normal  Intervals: normal  ST/T Wave abnormalities: normal  Conduction Disutrbances:none  Narrative Interpretation:   Old EKG Reviewed: unchanged Unchanged from 10/08/2011   Dg Chest 2 View  12/16/2011  *RADIOLOGY REPORT*  Clinical Data: Tachycardia and shortness of breath.  CHEST - 2 VIEW  Comparison: 10/08/2011  Findings: Two views of the chest demonstrate hyperinflation. Stable calcification in the right upper lung.  Heart and mediastinum are within normal limits and the trachea is midline. No evidence for a pneumothorax.  No focal airspace disease or edema.  IMPRESSION: Stable hyperinflation which could represent emphysematous changes. No acute findings.  Original Report Authenticated By: Richarda Overlie, M.D.     1. Shortness of breath       MDM  Workup in the emergency department to include troponin and EKG without acute cardiac findings chest x-ray raises suspicion for some emphysema. She without any wheezing or  shortness of breath here oxygen saturations on room air about 90%. Also chest x-ray without evidence of pneumonia. Will treat with albuterol inhaler for the next  7 days. She had discharge nontoxic in no acute distress.  I personally performed the services described in this documentation, which was scribed in my presence. The recorded information has been reviewed and considered.        Shelda Jakes, MD 12/16/11 [phone]

## 2011-12-16 NOTE — ED Notes (Signed)
Pt given a meal tray 

## 2011-12-16 NOTE — Discharge Instructions (Signed)
Shortness of Breath Shortness of breath (dyspnea) is the feeling of uneasy breathing. Shortness of breath needs care right away. HOME CARE   Do not smoke.   Avoid being around chemicals that may bother your breathing (such as paint fumes or dust).   Rest as needed. Slowly begin your usual activities.   Only take medicine as told by your doctor. Inhaled medicines or oxygen might be part of your treatment.   Follow up with your doctor as told. Waiting to do so or failure to follow up could result in worsening your condition and possible disability or death.   Understand what to do or who to call if your shortness of breath gets worse.  GET HELP RIGHT AWAY IF:   You get pain in your chest, shoulders, belly (abdomen), or jaw.   You cannot stop coughing or you start wheezing.   You cough up blood or thick mucus.   You can only speak with short words.   You have a fever.   You feel your heart racing or skipping beats.   You are not breathing better when you stop and rest.   Your condition does not improve in the time expected.   You have problems with medicines.  MAKE SURE YOU:  Understand these instructions.   Will watch your condition.   Will get help right away if you are not doing well or get worse.  Document Released: 04/04/2008 Document Revised: 06/29/2011 Document Reviewed: 09/02/2009 [facility] Patient Information 2012 [place], [place].  Use albuterol inhaler 2 puffs every 6 hours for one week then as needed. Return for new or worse symptoms. If not better in 2-3 days followup with Dr. Juanetta Gosling.

## 2011-12-17 NOTE — ED Notes (Addendum)
Pt alert & oriented x4, stable gait. Pt given discharge instructions, paperwork, pt verbalized understanding. Pt left department w/ no further questions.  

## 2011-12-19 ENCOUNTER — Other Ambulatory Visit: Payer: Self-pay

## 2011-12-19 ENCOUNTER — Emergency Department (HOSPITAL_COMMUNITY)
Admission: EM | Admit: 2011-12-19 | Discharge: 2011-12-19 | Disposition: A | Discharge status: Home / Self Care | Payer: PRIVATE HEALTH INSURANCE | Attending: Emergency Medicine | Admitting: Emergency Medicine

## 2011-12-19 ENCOUNTER — Emergency Department (HOSPITAL_COMMUNITY): Payer: PRIVATE HEALTH INSURANCE

## 2011-12-19 ENCOUNTER — Encounter (HOSPITAL_COMMUNITY): Payer: Self-pay | Admitting: Emergency Medicine

## 2011-12-19 DIAGNOSIS — F259 Schizoaffective disorder, unspecified: Secondary | ICD-10-CM | POA: Insufficient documentation

## 2011-12-19 DIAGNOSIS — Z87891 Personal history of nicotine dependence: Secondary | ICD-10-CM | POA: Insufficient documentation

## 2011-12-19 DIAGNOSIS — J449 Chronic obstructive pulmonary disease, unspecified: Secondary | ICD-10-CM | POA: Insufficient documentation

## 2011-12-19 DIAGNOSIS — I10 Essential (primary) hypertension: Secondary | ICD-10-CM | POA: Insufficient documentation

## 2011-12-19 DIAGNOSIS — J4489 Other specified chronic obstructive pulmonary disease: Secondary | ICD-10-CM | POA: Insufficient documentation

## 2011-12-19 DIAGNOSIS — F411 Generalized anxiety disorder: Secondary | ICD-10-CM | POA: Insufficient documentation

## 2011-12-19 LAB — BASIC METABOLIC PANEL
CO2: 28 mEq/L (ref 19–32)
Chloride: 102 mEq/L (ref 96–112)
Creatinine, Ser: 0.84 mg/dL (ref 0.50–1.35)
Potassium: 3.5 mEq/L (ref 3.5–5.1)
Sodium: 137 mEq/L (ref 135–145)

## 2011-12-19 LAB — CBC
HCT: 31.1 % — ABNORMAL LOW (ref 39.0–52.0)
Hemoglobin: 10.6 g/dL — ABNORMAL LOW (ref 13.0–17.0)
MCV: 96.6 fL (ref 78.0–100.0)
RBC: 3.22 MIL/uL — ABNORMAL LOW (ref 4.22–5.81)
WBC: 2.8 10*3/uL — ABNORMAL LOW (ref 4.0–10.5)

## 2011-12-19 LAB — DIFFERENTIAL
Eosinophils Relative: 6 % — ABNORMAL HIGH (ref 0–5)
Lymphocytes Relative: 24 % (ref 12–46)
Lymphs Abs: 0.7 10*3/uL (ref 0.7–4.0)
Monocytes Absolute: 0.5 10*3/uL (ref 0.1–1.0)

## 2011-12-19 MED ORDER — ALBUTEROL SULFATE HFA 108 (90 BASE) MCG/ACT IN AERS: 2.0000 | INHALATION_SPRAY | RESPIRATORY_TRACT | Status: DC | PRN

## 2011-12-19 MED ORDER — IPRATROPIUM BROMIDE 0.02 % IN SOLN
0.5000 mg | Freq: Once | RESPIRATORY_TRACT | Status: AC
  Administered 2011-12-19: 0.5 mg via RESPIRATORY_TRACT
  Filled 2011-12-19: qty 2.5

## 2011-12-19 MED ORDER — ALBUTEROL SULFATE (5 MG/ML) 0.5% IN NEBU
5.0000 mg | INHALATION_SOLUTION | Freq: Once | RESPIRATORY_TRACT | Status: AC
  Administered 2011-12-19: 5 mg via RESPIRATORY_TRACT
  Filled 2011-12-19: qty 1

## 2011-12-19 MED ORDER — PREDNISONE 20 MG PO TABS
60.0000 mg | ORAL_TABLET | Freq: Once | ORAL | Status: AC
  Administered 2011-12-19: 60 mg via ORAL
  Filled 2011-12-19: qty 3

## 2011-12-19 MED ORDER — PREDNISONE 20 MG PO TABS: 60.0000 mg | ORAL_TABLET | Freq: Every day | ORAL | Status: DC

## 2011-12-19 NOTE — ED Notes (Signed)
Cab called to send patient home

## 2011-12-19 NOTE — Discharge Instructions (Signed)
Your chest x-ray does not show pneumonia.  Your blood tests do not show any significant illness.  Use albuterol for recurrent wheezing and shortness of breath.  Use prednisone to reduce inflammation in your lungs and prevent recurrence of your symptoms.  Followup with your Dr. If your symptoms persist despite using medications.  Return for worse or uncontrolled symptoms

## 2011-12-19 NOTE — ED Notes (Signed)
Pt c/o shortness of breath that has gotten worse over the last couple of days. Pt states he could not get in to see his pcp today.

## 2011-12-19 NOTE — ED Provider Notes (Signed)
History    This chart was scribed for Nicholes Stairs, MD, MD by Smitty Pluck. The patient was seen in room APA05 and the patient's care was started at 9:26AM.   CSN: [id number]  Arrival date & time 12/19/11  4098   First MD Initiated Contact with Patient 12/19/11 [phone]      Chief Complaint  Patient presents with  . Shortness of Breath    (Consider location/radiation/quality/duration/timing/severity/associated sxs/prior treatment) Patient is a 78 y.o. male presenting with shortness of breath. The history is provided by the patient.  Shortness of Breath  Associated symptoms include shortness of breath.   [name] is a 78 y.o. male who presents to the Emergency Department complaining of SOB onset 3-4 days ago. Pt reports that his SOB has gotten worse since onset. He states that he is not in any other pain. Pt has nonproductive cough. He denies fevers, sweating, chills, nausea, vomiting, and edema in legs. Pt does not use oxygen at home. He denies having congestive heart failure, asthma, and emphysema. He denies kidney and liver illnesses. Pt is not allergic to any medications.  PCP is Dr. Juanetta Gosling    Past Medical History  Diagnosis Date  . COPD (chronic obstructive pulmonary disease)   . Anxiety   . Alcohol abuse   . Anxiety   . Generalized headaches   . Hypertension   . AV malformation of GI tract   . Schizoaffective disorder     Past Surgical History  Procedure Date  . No past surgeries     No family history on file.  History  Substance Use Topics  . Smoking status: Former Smoker    Quit date: 05/07/1969  . Smokeless tobacco: Not on file  . Alcohol Use: 0.0 oz/week      Review of Systems  Respiratory: Positive for shortness of breath.   All other systems reviewed and are negative.   10 Systems reviewed and are negative for acute change except as noted in the HPI.  Allergies  Review of patient's allergies indicates no known allergies.  Home  Medications   Current Outpatient Rx  Name Route Sig Dispense Refill  . ASPIRIN PO Oral Take 1 tablet by mouth daily.    Marland Kitchen DIAZEPAM 5 MG PO TABS Oral Take 5 mg by mouth 2 (two) times daily as needed. nerves     . LORAZEPAM 1 MG PO TABS Oral Take 1-2 mg by mouth 2 (two) times daily as needed. 2 in the  morning and 1 at night    . TIOTROPIUM BROMIDE MONOHYDRATE 18 MCG IN CAPS Inhalation Place 18 mcg into inhaler and inhale daily.      There were no vitals taken for this visit.  Physical Exam  Nursing note and vitals reviewed. Constitutional: He is oriented to person, place, and time. He appears well-developed and well-nourished. No distress.  HENT:  Head: Normocephalic and atraumatic.  Eyes: Conjunctivae are normal. Pupils are equal, round, and reactive to light.  Neck: Normal range of motion. Neck supple.  Cardiovascular: Normal rate, regular rhythm and normal heart sounds.   No murmur heard.      No bruits Capillary refill less than 2 seconds  Pulmonary/Chest: Effort normal. He has wheezes (mild expiratory bilaterally ).  Abdominal: Soft. He exhibits no distension. There is no tenderness.  Musculoskeletal: He exhibits no edema and no tenderness.  Neurological: He is alert and oriented to person, place, and time.  Skin: Skin is warm and dry.  Psychiatric:  He has a normal mood and affect. His behavior is normal.    ED Course  Procedures (including critical care time)  DIAGNOSTIC STUDIES: Oxygen Saturation is 95% on [place], normal by my interpretation.    COORDINATION OF CARE: 9:33AM EDP discusses pt ED treatment course with pt     Labs Reviewed  BASIC METABOLIC PANEL - Abnormal; Notable for the following:    Glucose, Bld 103 (*)    GFR calc non Af Amer 81 (*)    All other components within normal limits  CBC - Abnormal; Notable for the following:    WBC 2.8 (*)    RBC 3.22 (*)    Hemoglobin 10.6 (*)    HCT 31.1 (*)    RDW 17.7 (*)    All other components within normal  limits  DIFFERENTIAL - Abnormal; Notable for the following:    Neutro Abs 1.4 (*)    Monocytes Relative 17 (*)    Eosinophils Relative 6 (*)    All other components within normal limits   Dg Chest 2 View  12/19/2011  *RADIOLOGY REPORT*  Clinical Data: Chest pain and weakness.  CHEST - 2 VIEW  Comparison: 12/16/2011.  Findings: The cardiac silhouette, mediastinal and hilar contours are within normal limits and stable.  Stable emphysematous changes and areas of interstitial scarring.  No infiltrates, edema or effusions.  No pneumothorax.  The bony thorax is intact.  IMPRESSION: Chronic lung changes/emphysema but no acute overlying pulmonary process.  Original Report Authenticated By: [name], M.D.     No diagnosis found.  .ED ECG REPORT   Date: 12/19/2011  EKG Time: 10:10 AM  Rate: 81  Rhythm: normal sinus rhythm,   Axis: nl  Intervals:none  ST&T Change: none  Narrative Interpretation: nsr with decreased r waves ant.     11:13 AM Pt feels better.   Wants to go home. Still faint end exp wheezes but sats 100% ra.       MDM  Sob COPD exacerbation No resp distress. No pneumonia. No hypoxia following tx.   I personally performed the services described in this documentation, which was scribed in my presence. The recorded information has been reviewed and considered.     Nicholes Stairs, MD 12/19/11 1114

## 2011-12-19 NOTE — ED Notes (Signed)
States he is ready to leave now, edp notified

## 2011-12-21 ENCOUNTER — Ambulatory Visit (HOSPITAL_COMMUNITY): Admission: RE | Admit: 2011-12-21 | Payer: PRIVATE HEALTH INSURANCE | Source: Ambulatory Visit

## 2011-12-22 ENCOUNTER — Emergency Department (HOSPITAL_COMMUNITY)
Admission: EM | Admit: 2011-12-22 | Discharge: 2011-12-22 | Discharge status: Against Medical Advice | Payer: PRIVATE HEALTH INSURANCE | Attending: Pulmonary Disease | Admitting: Pulmonary Disease

## 2011-12-22 ENCOUNTER — Encounter (HOSPITAL_COMMUNITY): Payer: Self-pay | Admitting: *Deleted

## 2011-12-22 DIAGNOSIS — I10 Essential (primary) hypertension: Secondary | ICD-10-CM | POA: Insufficient documentation

## 2011-12-22 DIAGNOSIS — J4489 Other specified chronic obstructive pulmonary disease: Secondary | ICD-10-CM | POA: Insufficient documentation

## 2011-12-22 DIAGNOSIS — J449 Chronic obstructive pulmonary disease, unspecified: Secondary | ICD-10-CM | POA: Insufficient documentation

## 2011-12-22 DIAGNOSIS — K922 Gastrointestinal hemorrhage, unspecified: Secondary | ICD-10-CM | POA: Insufficient documentation

## 2011-12-22 DIAGNOSIS — F411 Generalized anxiety disorder: Secondary | ICD-10-CM | POA: Insufficient documentation

## 2011-12-22 LAB — BASIC METABOLIC PANEL
BUN: 12 mg/dL (ref 6–23)
CO2: 27 mEq/L (ref 19–32)
Calcium: 10.1 mg/dL (ref 8.4–10.5)
Creatinine, Ser: 0.82 mg/dL (ref 0.50–1.35)

## 2011-12-22 LAB — DIFFERENTIAL
Basophils Absolute: 0 10*3/uL (ref 0.0–0.1)
Basophils Relative: 0 % (ref 0–1)
Eosinophils Relative: 0 % (ref 0–5)
Lymphocytes Relative: 8 % — ABNORMAL LOW (ref 12–46)
Monocytes Absolute: 0.9 10*3/uL (ref 0.1–1.0)
Monocytes Relative: 15 % — ABNORMAL HIGH (ref 3–12)

## 2011-12-22 LAB — TYPE AND SCREEN: ABO/RH(D): O POS

## 2011-12-22 LAB — CBC
HCT: 28.3 % — ABNORMAL LOW (ref 39.0–52.0)
MCHC: 33.9 g/dL (ref 30.0–36.0)
MCV: 96.3 fL (ref 78.0–100.0)
RDW: 17.5 % — ABNORMAL HIGH (ref 11.5–15.5)

## 2011-12-22 NOTE — ED Notes (Addendum)
Pt came in today for black tarry stools. He said he had been constipated for the last two days so he used rubber gloves and vaseline to perform self rectal exam. When he pulled his finger out a "chunk of black stuff" came out.  Denies pain.  Breath sounds clear.  Speaking full, clear sentences. nad noted.

## 2011-12-22 NOTE — ED Notes (Signed)
Feels sob, stools black,  Was supposed to have pulm funct test on 2/20 , but did not have it done.  Alert , talking,

## 2011-12-22 NOTE — ED Notes (Signed)
Pt asking for food. Explained to pt that he can only have ice chips at this time. Pt stated, "If I don't get any food by 2:00, I'm going to walk my black ass up out of here" Attempted to explain why but he repeated prior comment two more times.

## 2011-12-22 NOTE — ED Provider Notes (Signed)
History   This chart was scribed for Joya Gaskins, MD by Clarita Crane. The patient was seen in room APA19/APA19. Patient's care was started at 1134.    CSN: [id number]  Arrival date & time 12/22/11  1134   First MD Initiated Contact with Patient 12/22/11 1204      Chief Complaint  Patient presents with  . Melena     HPI [name] is a 78 y.o. male who presents to the Emergency Department complaining of an episode of moderate to severe melena onset last night with associated generalized weakness. Denies associated nausea, vomiting, abdominal pain, diarrhea. Patient with h/o COPD, HTN, schizoaffective disorder, AV malformation of GI tract.  PCP-[facility]  Past Medical History  Diagnosis Date  . COPD (chronic obstructive pulmonary disease)   . Anxiety   . Alcohol abuse   . Anxiety   . Generalized headaches   . Hypertension   . AV malformation of GI tract   . Schizoaffective disorder     Past Surgical History  Procedure Date  . No past surgeries     History reviewed. No pertinent family history.  History  Substance Use Topics  . Smoking status: Former Smoker    Quit date: 05/07/1969  . Smokeless tobacco: Not on file  . Alcohol Use: 0.0 oz/week      Review of Systems 10 Systems reviewed and are negative for acute change except as noted in the HPI.  Allergies  Black pepper  Home Medications   Current Outpatient Rx  Name Route Sig Dispense Refill  . DIAZEPAM 5 MG PO TABS Oral Take 5 mg by mouth 2 (two) times daily. nerves    . PREDNISONE 20 MG PO TABS Oral Take 20 mg by mouth 3 (three) times daily.    Marland Kitchen TIOTROPIUM BROMIDE MONOHYDRATE 18 MCG IN CAPS Inhalation Place 18 mcg into inhaler and inhale daily.      BP 102/55  Pulse 80  Temp(Src) 98.2 F (36.8 C) (Oral)  Resp 18  Wt 122 lb (55.339 kg)  SpO2 100%  Physical Exam CONSTITUTIONAL: Well developed/well nourished HEAD AND FACE: Normocephalic/atraumatic EYES: EOMI/PERRL ENMT: Mucous  membranes moist NECK: supple no meningeal signs CV: S1/S2 noted, no murmurs/rubs/gallops noted LUNGS: Lungs are clear to auscultation bilaterally, no apparent distress ABDOMEN: soft, nontender, no rebound or guarding GU: chaperone present for rectal exam, black stool with blood upon exam NEURO: Pt is awake/alert, moves all extremitiesx4 EXTREMITIES: pulses normal, full ROM SKIN: warm, color normal PSYCH: no abnormalities of mood noted  ED Course  Procedures   DIAGNOSTIC STUDIES: Oxygen Saturation is 100% on room air, normal by my interpretation.    COORDINATION OF CARE: 12:45PM- Patient informed of current plan for treatment and evaluation and agrees with plan at this time.  1:24PM- Consult complete with Dr. Juanetta Gosling. Patient case explained and discussed. Dr. Juanetta Gosling agrees to have patient admitted for further evaluation and treatment. D/w dr Jena Gauss, GI aware of patient Pt stable at this time   Results for orders placed during the hospital encounter of 12/22/11  CBC      Component Value Range   WBC 5.9  4.0 - 10.5 (K/uL)   RBC 2.94 (*) 4.22 - 5.81 (MIL/uL)   Hemoglobin 9.6 (*) 13.0 - 17.0 (g/dL)   HCT 40.9 (*) 81.1 - 52.0 (%)   MCV 96.3  78.0 - 100.0 (fL)   MCH 32.7  26.0 - 34.0 (pg)   MCHC 33.9  30.0 - 36.0 (g/dL)   RDW  17.5 (*) 11.5 - 15.5 (%)   Platelets 345  150 - 400 (K/uL)  DIFFERENTIAL      Component Value Range   Neutrophils Relative 76  43 - 77 (%)   Neutro Abs 4.5  1.7 - 7.7 (K/uL)   Lymphocytes Relative 8 (*) 12 - 46 (%)   Lymphs Abs 0.5 (*) 0.7 - 4.0 (K/uL)   Monocytes Relative 15 (*) 3 - 12 (%)   Monocytes Absolute 0.9  0.1 - 1.0 (K/uL)   Eosinophils Relative 0  0 - 5 (%)   Eosinophils Absolute 0.0  0.0 - 0.7 (K/uL)   Basophils Relative 0  0 - 1 (%)   Basophils Absolute 0.0  0.0 - 0.1 (K/uL)  BASIC METABOLIC PANEL      Component Value Range   Sodium 139  135 - 145 (mEq/L)   Potassium 3.3 (*) 3.5 - 5.1 (mEq/L)   Chloride 104  96 - 112 (mEq/L)   CO2 27   19 - 32 (mEq/L)   Glucose, Bld 95  70 - 99 (mg/dL)   BUN 12  6 - 23 (mg/dL)   Creatinine, Ser 1.61  0.50 - 1.35 (mg/dL)   Calcium 09.6  8.4 - 10.5 (mg/dL)   GFR calc non Af Amer 82 (*) >90 (mL/min)   GFR calc Af Amer >90  >90 (mL/min)  TYPE AND SCREEN      Component Value Range   ABO/RH(D) O POS     Antibody Screen NEG     Sample Expiration 12/25/2011        MDM  Nursing notes reviewed and considered in documentation All labs/vitals reviewed and considered Previous records reviewed and considered       I personally performed the services described in this documentation, which was scribed in my presence. The recorded information has been reviewed and considered.      Joya Gaskins, MD 12/22/11 1343

## 2011-12-22 NOTE — ED Notes (Addendum)
[name] with [facility] [employer] [facility] contacted for eval and referral.  - speaking with Dr. Bebe Shaggy at this time.

## 2011-12-22 NOTE — ED Notes (Addendum)
Entered pt's room, pt states if he doesn't get something to eat that he is going to walk out of here.  Spoke with edp and informed to give pt crackers and something to drink but not full meal.  Pt informed and satisfied.  Pt then reports that if he doesn't "get waited on" soon he is going to "walk his black ass right out of here."  Informed pt that he was going to be admitted for further testing.  Pt refused admission, refused IV access.  Dr. Juanetta Gosling and Dr. Bebe Shaggy at bedside to explain to pt risks/benefits of leaving AMA and being admitted.  Pt verbalized understanding but still refusing to stay over night.  Refused to sign AMA form.   Pt reports taking half of Valium 5mg  while in room.  Pt also reports that he is going home to find a "plant" to sip to take pain away.  Reports that if he gets any worse, he will find a ride to [facility].  RN also explained risks/benefits and explained if he gets any worse to get to nearest hospital.  Pt reports he will go to [facility].

## 2011-12-22 NOTE — Progress Notes (Signed)
I came to admit him to the hospital but he says he is not staying. I told him he was bleeding and he could potentially die from this. He says he understands but he is not going to stay and he trusts God will keep him alive. I think he understands the problem and the severity but he is not willing to stay.  He is going to leave AMA

## 2011-12-23 ENCOUNTER — Inpatient Hospital Stay (HOSPITAL_COMMUNITY)
Admission: EM | Admit: 2011-12-23 | Discharge: 2011-12-23 | DRG: 379 | Discharge status: Against Medical Advice | Payer: PRIVATE HEALTH INSURANCE | Attending: Pulmonary Disease | Admitting: Pulmonary Disease

## 2011-12-23 ENCOUNTER — Encounter: Payer: Self-pay | Admitting: Cardiology

## 2011-12-23 ENCOUNTER — Encounter (HOSPITAL_COMMUNITY): Payer: Self-pay

## 2011-12-23 ENCOUNTER — Other Ambulatory Visit: Payer: Self-pay

## 2011-12-23 DIAGNOSIS — E876 Hypokalemia: Secondary | ICD-10-CM | POA: Diagnosis present

## 2011-12-23 DIAGNOSIS — F259 Schizoaffective disorder, unspecified: Secondary | ICD-10-CM | POA: Diagnosis present

## 2011-12-23 DIAGNOSIS — D649 Anemia, unspecified: Secondary | ICD-10-CM

## 2011-12-23 DIAGNOSIS — Z91018 Allergy to other foods: Secondary | ICD-10-CM

## 2011-12-23 DIAGNOSIS — K449 Diaphragmatic hernia without obstruction or gangrene: Secondary | ICD-10-CM | POA: Diagnosis present

## 2011-12-23 DIAGNOSIS — J4489 Other specified chronic obstructive pulmonary disease: Secondary | ICD-10-CM | POA: Diagnosis present

## 2011-12-23 DIAGNOSIS — K922 Gastrointestinal hemorrhage, unspecified: Secondary | ICD-10-CM

## 2011-12-23 DIAGNOSIS — Z23 Encounter for immunization: Secondary | ICD-10-CM

## 2011-12-23 DIAGNOSIS — F1010 Alcohol abuse, uncomplicated: Secondary | ICD-10-CM | POA: Diagnosis present

## 2011-12-23 DIAGNOSIS — K921 Melena: Principal | ICD-10-CM | POA: Diagnosis present

## 2011-12-23 DIAGNOSIS — J449 Chronic obstructive pulmonary disease, unspecified: Secondary | ICD-10-CM | POA: Diagnosis present

## 2011-12-23 LAB — BASIC METABOLIC PANEL
BUN: 8 mg/dL (ref 6–23)
CO2: 25 mEq/L (ref 19–32)
Calcium: 9.6 mg/dL (ref 8.4–10.5)
Creatinine, Ser: 0.91 mg/dL (ref 0.50–1.35)
GFR calc non Af Amer: 78 mL/min — ABNORMAL LOW (ref >90)
Glucose, Bld: 82 mg/dL (ref 70–99)
Sodium: 144 mEq/L (ref 135–145)

## 2011-12-23 LAB — MAGNESIUM: Magnesium: 2.1 mg/dL (ref 1.5–2.5)

## 2011-12-23 LAB — CBC
HCT: 30.8 % — ABNORMAL LOW (ref 39.0–52.0)
MCH: 32.3 pg (ref 26.0–34.0)
MCV: 97.5 fL (ref 78.0–100.0)
Platelets: 382 10*3/uL (ref 150–400)
RBC: 3.16 MIL/uL — ABNORMAL LOW (ref 4.22–5.81)

## 2011-12-23 LAB — DIFFERENTIAL
Eosinophils Absolute: 0 10*3/uL (ref 0.0–0.7)
Eosinophils Relative: 0 % (ref 0–5)
Lymphocytes Relative: 23 % (ref 12–46)
Lymphs Abs: 0.9 10*3/uL (ref 0.7–4.0)
Monocytes Absolute: 0.8 10*3/uL (ref 0.1–1.0)
Monocytes Relative: 20 % — ABNORMAL HIGH (ref 3–12)

## 2011-12-23 MED ORDER — ALBUTEROL SULFATE HFA 108 (90 BASE) MCG/ACT IN AERS: 2.0000 | INHALATION_SPRAY | RESPIRATORY_TRACT | Status: DC | PRN

## 2011-12-23 MED ORDER — POTASSIUM CHLORIDE CRYS ER 20 MEQ PO TBCR
20.0000 meq | EXTENDED_RELEASE_TABLET | Freq: Three times a day (TID) | ORAL | Status: DC
Start: 2011-12-23 — End: 2011-12-23

## 2011-12-23 MED ORDER — TIOTROPIUM BROMIDE MONOHYDRATE 18 MCG IN CAPS
18.0000 ug | ORAL_CAPSULE | Freq: Every day | RESPIRATORY_TRACT | Status: DC
  Filled 2011-12-23: qty 5

## 2011-12-23 MED ORDER — ADULT MULTIVITAMIN W/MINERALS CH: 1.0000 | ORAL_TABLET | Freq: Every day | ORAL | Status: DC

## 2011-12-23 MED ORDER — FOLIC ACID 1 MG PO TABS: 1.0000 mg | ORAL_TABLET | Freq: Every day | ORAL | Status: DC

## 2011-12-23 MED ORDER — SODIUM CHLORIDE 0.9 % IV SOLN
INTRAVENOUS | Status: DC
  Administered 2011-12-23: 14:00:00 via INTRAVENOUS

## 2011-12-23 MED ORDER — LORAZEPAM 1 MG PO TABS: 1.0000 mg | ORAL_TABLET | Freq: Two times a day (BID) | ORAL | Status: DC | PRN

## 2011-12-23 MED ORDER — LORAZEPAM 1 MG PO TABS: 1.0000 mg | ORAL_TABLET | ORAL | Status: DC | PRN

## 2011-12-23 MED ORDER — VITAMIN B-1 100 MG PO TABS: 100.0000 mg | ORAL_TABLET | Freq: Every day | ORAL | Status: DC

## 2011-12-23 MED ORDER — PANTOPRAZOLE SODIUM 40 MG IV SOLR: 40.0000 mg | Freq: Two times a day (BID) | INTRAVENOUS | Status: DC

## 2011-12-23 NOTE — ED Provider Notes (Signed)
History     CSN: [id number]  Arrival date & time 12/23/11  1217   First MD Initiated Contact with Patient 12/23/11 1312      Chief Complaint  Patient presents with  . Fatigue    HPI Pt was seen at 1315.  Per pt, c/o gradual onset and worsening of persistent generalized weakness/fatigue and lightheadedness that has been ongoing for the past several days.  Pt states he has been having "dark black stools" for the past several days.  Pt was in eval in the ED yesterday for same, was advised to be admitted to the hospital and left AMA.  Pt states his symptoms continued today and he came back to be admitted.  Denies CP/palpitations, no SOB/cough, no abd pain, no back pain, no N/V/D, no fevers, no rash.   PMD:  Juanetta Gosling Past Medical History  Diagnosis Date  . COPD (chronic obstructive pulmonary disease)   . Anxiety   . Alcohol abuse   . Anemia   . Generalized headaches   . Hypertension   . AV malformation of GI tract   . Schizoaffective disorder   . Pulmonary nodule/lesion, solitary     Past Surgical History  Procedure Date  . No past surgeries     History  Substance Use Topics  . Smoking status: Former Smoker    Quit date: 05/07/1969  . Smokeless tobacco: Current User    Types: Snuff  . Alcohol Use: 0.0 oz/week     ETOH    Review of Systems ROS: Statement: All systems negative except as marked or noted in the HPI; Constitutional: Negative for fever and chills. ; ; Eyes: Negative for eye pain, redness and discharge. ; ; ENMT: Negative for ear pain, hoarseness, nasal congestion, sinus pressure and sore throat. ; ; Cardiovascular: Negative for chest pain, palpitations, diaphoresis, dyspnea and peripheral edema. ; ; Respiratory: Negative for cough, wheezing and stridor. ; ; Gastrointestinal: +"dark black stools."  Negative for nausea, vomiting, diarrhea, abdominal pain, blood in stool, hematemesis, jaundice and rectal bleeding. . ; ; Genitourinary: Negative for dysuria, flank pain  and hematuria. ; ; Musculoskeletal: Negative for back pain and neck pain. Negative for swelling and trauma.; ; Skin: Negative for pruritus, rash, abrasions, blisters, bruising and skin lesion.; ; Neuro: +generalized weakness/fatigue, lightheadedness.  Negative for headache and neck stiffness. Negative for altered level of consciousness , altered mental status, extremity weakness, paresthesias, involuntary movement, seizure and syncope.     Allergies  Black pepper  Home Medications   Current Outpatient Rx  Name Route Sig Dispense Refill  . ALBUTEROL SULFATE HFA 108 (90 BASE) MCG/ACT IN AERS Inhalation Inhale 2 puffs into the lungs every 4 (four) hours as needed.    Marland Kitchen LORAZEPAM 1 MG PO TABS Oral Take 1 mg by mouth 2 (two) times daily as needed.    Marland Kitchen TIOTROPIUM BROMIDE MONOHYDRATE 18 MCG IN CAPS Inhalation Place 18 mcg into inhaler and inhale daily.      BP 107/60  Pulse 81  Temp 98.2 F (36.8 C)  Resp 18  Wt 122 lb (55.339 kg)  SpO2 97%  Physical Exam 1320: Physical examination:  Nursing notes reviewed; Vital signs and O2 SAT reviewed;  Constitutional: Thin, In no acute distress; Head:  Normocephalic, atraumatic; Eyes: EOMI, PERRL, No scleral icterus; ENMT: Mouth and pharynx normal, Mucous membranes dry; Neck: Supple, Full range of motion, No lymphadenopathy; Cardiovascular: Regular rate and rhythm, No murmur, rub, or gallop; Respiratory: Breath sounds clear & equal bilaterally,  No rales, rhonchi, wheezes, or rub, Normal respiratory effort/excursion; Chest: Nontender, Movement normal; Abdomen: Soft, Nontender, Nondistended, Normal bowel sounds;  Extremities: Pulses normal, No tenderness, No edema, No calf edema or asymmetry.; Neuro: AA&Ox3, Major CN grossly intact. No facial droop, speech clear.  No gross focal motor or sensory deficits in extremities.; Skin: Color normal, Warm, Dry, no rash.    ED Course  Procedures   1350:  Pt was here yesterday for same, was seen by admitting MD, then  left AMA.  EPIC chart review also notes that GI MD Rourk was consulted yesterday.  Pt is agreeable to stay for admission today.  T/C to Dr. Ouida Sills (on call for Dr. Juanetta Gosling), case discussed, including:  HPI, pertinent PM/SHx, VS/PE, dx testing, ED course and treatment.  Agreeable to admit.  Requests to speak with ED RN to give orders.    MDM  MDM Reviewed: nursing note, previous chart and vitals Reviewed previous: labs, ECG and x-ray Interpretation: ECG and labs    Date: 12/23/2011  Rate: 90  Rhythm: normal sinus rhythm  QRS Axis: normal  Intervals: normal  ST/T Wave abnormalities: normal  Conduction Disutrbances:none  Narrative Interpretation:   Old EKG Reviewed: unchanged; no significant changes from previous EKG dated 12/19/2011.  Results for orders placed during the hospital encounter of 12/23/11  CBC      Component Value Range   WBC 4.0  4.0 - 10.5 (K/uL)   RBC 3.16 (*) 4.22 - 5.81 (MIL/uL)   Hemoglobin 10.2 (*) 13.0 - 17.0 (g/dL)   HCT 16.1 (*) 09.6 - 52.0 (%)   MCV 97.5  78.0 - 100.0 (fL)   MCH 32.3  26.0 - 34.0 (pg)   MCHC 33.1  30.0 - 36.0 (g/dL)   RDW 04.5 (*) 40.9 - 15.5 (%)   Platelets 382  150 - 400 (K/uL)  DIFFERENTIAL      Component Value Range   Neutrophils Relative 57  43 - 77 (%)   Neutro Abs 2.3  1.7 - 7.7 (K/uL)   Lymphocytes Relative 23  12 - 46 (%)   Lymphs Abs 0.9  0.7 - 4.0 (K/uL)   Monocytes Relative 20 (*) 3 - 12 (%)   Monocytes Absolute 0.8  0.1 - 1.0 (K/uL)   Eosinophils Relative 0  0 - 5 (%)   Eosinophils Absolute 0.0  0.0 - 0.7 (K/uL)   Basophils Relative 0  0 - 1 (%)   Basophils Absolute 0.0  0.0 - 0.1 (K/uL)  BASIC METABOLIC PANEL      Component Value Range   Sodium 144  135 - 145 (mEq/L)   Potassium 3.0 (*) 3.5 - 5.1 (mEq/L)   Chloride 108  96 - 112 (mEq/L)   CO2 25  19 - 32 (mEq/L)   Glucose, Bld 82  70 - 99 (mg/dL)   BUN 8  6 - 23 (mg/dL)   Creatinine, Ser 8.11  0.50 - 1.35 (mg/dL)   Calcium 9.6  8.4 - 91.4 (mg/dL)   GFR calc non  Af Amer 78 (*) >90 (mL/min)   GFR calc Af Amer >90  >90 (mL/min)  TROPONIN I      Component Value Range   Troponin I <0.30  <0.30 (ng/mL)   Dg Chest 2 View 12/19/2011  *RADIOLOGY REPORT*  Clinical Data: Chest pain and weakness.  CHEST - 2 VIEW  Comparison: 12/16/2011.  Findings: The cardiac silhouette, mediastinal and hilar contours are within normal limits and stable.  Stable emphysematous changes and areas  of interstitial scarring.  No infiltrates, edema or effusions.  No pneumothorax.  The bony thorax is intact.  IMPRESSION: Chronic lung changes/emphysema but no acute overlying pulmonary process.  Original Report Authenticated By: [name], M.D.                Laray Anger, DO 12/26/11 1252

## 2011-12-23 NOTE — ED Notes (Signed)
Pt transported to floor, report given to [place], Charity fundraiser

## 2011-12-23 NOTE — ED Notes (Signed)
Pt reports generalized weakness and dizziness since approx 10am this morning.

## 2011-12-23 NOTE — ED Notes (Signed)
p given clear liquid diet tray

## 2011-12-23 NOTE — Progress Notes (Signed)
Pt left AMA with verbalized understandings of need for admission.  Pt states "I cannot stay here all night waiting for a GI doctor.  I cannot sleep in this bed".  Dr Juanetta Gosling aware of pt leaving AMA via phone call.  Pt Signed [facility] Nonresponsibility for Leaving Against Medical Advice, placed in pt chart.  Pt IV removed with positive clotting.  RN offered to phone family for a ride, pt declined.  Pt placed all his clothing back on and left with all his belongings ambulatory via steady gait to elevator.

## 2011-12-23 NOTE — ED Notes (Signed)
Pt presents to er with c/o weakness, pt states that he was here yesterday, according to dr. Notes from yesterday pt was seen in er for gi bleed, was advised to be admitted, pt refused left ama. Pt agrees to tx today and staying in hospital if needed.

## 2011-12-24 NOTE — H&P (Signed)
NAMERAJESH, WYSS NO.:  [id number]  MEDICAL RECORD NO.:  [id number]  LOCATION:  A334                          FACILITY:  [facility]  PHYSICIAN:  Kingsley Callander. Ouida Sills, MD       DATE OF BIRTH:  [birth year]  DATE OF ADMISSION:  12/23/2011 DATE OF DISCHARGE:  02/22/2013LH                             HISTORY & PHYSICAL   CHIEF COMPLAINT:  Weakness.  HISTORY OF PRESENT ILLNESS:  This patient is a 78 year old African American male, who presented to the emergency room for the 2nd time in 2 days complaining of weakness on the day prior to admission, he had been admitted, but left promptly AMA.  He had been experiencing black tarry stools and was anemic with a hemoglobin in the 9.5 range.  The patient gives a history of consuming alcohol daily.  He denies any NSAID use. Old records in the computer revealed an AV malformation of the gastrointestinal tract, but I do not know where that was located.  He also has a history of a hiatal hernia.  He is not anticoagulated with Coumadin, aspirin, Plavix, etc.  The patient denies abdominal pain.  He has not experienced vomiting.  He denied any bright red rectal bleeding.  PAST MEDICAL HISTORY: 1. Schizoaffective disorder. 2. COPD. 3. Anxiety. 4. Alcohol abuse. 5. Anemia. 6. Hypertension. 7. GI AVM.  MEDICATIONS: 1. Lorazepam 1 mg b.i.d. p.r.n. 2. Spiriva daily, although he states he has not recently been using     this. 3. Albuterol 2 puffs q.4 p.r.n.  ALLERGIES:  BLACK PEPPER.  SOCIAL HISTORY:  He is a regular drinker.  He denies tobacco use or recreational substance use.  FAMILY HISTORY:  He states his father shot himself.  REVIEW OF SYSTEMS:  No syncope, chest pain, increased difficulty breathing, or difficulty voiding.  PHYSICAL EXAMINATION:  GENERAL:  Alert and in no distress. HEENT:  Oropharynx is moist.  There is no scleral icterus. NECK:  Supple with no JVD or thyromegaly. LUNGS:  Clear. HEART:  Regular with no  murmurs. ABDOMEN:  Soft, and nontender with no hepatosplenomegaly.  He has had no previous abdominal surgery. RECTAL:  Rectal exam was performed in the emergency room. EXTREMITIES:  No cyanosis, clubbing, or edema. NEURO:  No focal weakness. LYMPH NODES:  No cervical or supraclavicular enlargement.  LABORATORY DATA:  Hemoglobin 10.2 with an MCV of 97, yesterday his hemoglobin was 9.6, white count 4.0, platelets 282,000.  Sodium 140, potassium 3.0, bicarb 25, BUN 8, creatinine 0.91, glucose 82.  IMPRESSION/PLAN: 1. Gastrointestinal bleeding with melena and normocytic anemia.  He     was hospitalized for additional evaluation.  Gastroenterology will     be consulted.  He will be treated with IV Protonix. 2. Chronic alcohol abuse.  We will treat with a detox regimen of     thiamine, multivitamins, folate, and p.r.n. Ativan, and magnesium     level will be checked. 3. Hypokalemia.  We will replace orally. 4. Chronic obstructive pulmonary disease, continue Spiriva with p.r.n.     albuterol. 5. Schizoaffective disorder.  The patient is somewhat disagreeable and     is uncertain whether he will cooperate with the plan.  Kingsley Callander. Ouida Sills, MD     ROF/MEDQ  D:  12/24/2011  T:  12/24/2011  Job:  [id number]

## 2011-12-26 ENCOUNTER — Encounter: Payer: Medicare Other | Admitting: Cardiology

## 2011-12-27 ENCOUNTER — Emergency Department (HOSPITAL_COMMUNITY)
Admission: EM | Admit: 2011-12-27 | Discharge: 2011-12-27 | Discharge status: Against Medical Advice | Payer: PRIVATE HEALTH INSURANCE | Attending: Emergency Medicine | Admitting: Emergency Medicine

## 2011-12-27 ENCOUNTER — Encounter (HOSPITAL_COMMUNITY): Payer: Self-pay | Admitting: *Deleted

## 2011-12-27 DIAGNOSIS — Z0389 Encounter for observation for other suspected diseases and conditions ruled out: Secondary | ICD-10-CM | POA: Insufficient documentation

## 2011-12-27 NOTE — ED Notes (Signed)
Pt refusing to stay to see edp because we do not have "pills to give him until he gets paid."  Pt states, "I'm going to the house."

## 2011-12-27 NOTE — ED Notes (Signed)
Pt here for refill of valium and inhaler.  Reports needs valium to help him sleep.

## 2011-12-29 ENCOUNTER — Emergency Department (HOSPITAL_COMMUNITY)
Admission: EM | Admit: 2011-12-29 | Discharge: 2011-12-29 | Disposition: A | Discharge status: Home / Self Care | Payer: PRIVATE HEALTH INSURANCE | Attending: Emergency Medicine | Admitting: Emergency Medicine

## 2011-12-29 ENCOUNTER — Encounter (HOSPITAL_COMMUNITY): Payer: Self-pay

## 2011-12-29 DIAGNOSIS — I10 Essential (primary) hypertension: Secondary | ICD-10-CM | POA: Insufficient documentation

## 2011-12-29 DIAGNOSIS — F411 Generalized anxiety disorder: Secondary | ICD-10-CM | POA: Insufficient documentation

## 2011-12-29 DIAGNOSIS — J449 Chronic obstructive pulmonary disease, unspecified: Secondary | ICD-10-CM

## 2011-12-29 DIAGNOSIS — J4489 Other specified chronic obstructive pulmonary disease: Secondary | ICD-10-CM | POA: Insufficient documentation

## 2011-12-29 MED ORDER — ALBUTEROL SULFATE HFA 108 (90 BASE) MCG/ACT IN AERS
2.0000 | INHALATION_SPRAY | Freq: Four times a day (QID) | RESPIRATORY_TRACT | Status: DC | PRN
  Filled 2011-12-29: qty 6.7

## 2011-12-29 NOTE — ED Provider Notes (Signed)
History   This chart was scribed for [name], MD scribed by Magnus Sinning. The patient was seen in room [facility] seen at 12:11.    CSN: [id number]  Arrival date & time 12/29/11  1059   First MD Initiated Contact with Patient 12/29/11 1209      Chief Complaint  Patient presents with  . Shortness of Breath    (Consider location/radiation/quality/duration/timing/severity/associated sxs/prior treatment) Patient is a 78 y.o. male presenting with shortness of breath. The history is provided by the patient. No language interpreter was used.  Shortness of Breath  The current episode started today. The onset was sudden. The problem occurs rarely. The problem is mild. The symptoms are relieved by nothing. Associated symptoms include shortness of breath.   [name] is a 78 y.o. male who presents to the Emergency Department because he wants another inhaler. He tried to get one at the pharmacy today, but they told him that he would not be able to do so until tomorrow.  He has been experiencing mild shortness of breath today.  Past Medical History  Diagnosis Date  . COPD (chronic obstructive pulmonary disease)   . Anxiety   . Alcohol abuse   . Anemia   . Generalized headaches   . Hypertension   . AV malformation of GI tract   . Pulmonary nodule/lesion, solitary   . Schizoaffective disorder     Past Surgical History  Procedure Date  . No past surgeries     No family history on file.  History  Substance Use Topics  . Smoking status: Former Smoker    Quit date: 05/07/1969  . Smokeless tobacco: Current User    Types: Snuff  . Alcohol Use: 2.4 oz/week    4 Cans of beer per week     ETOH      Review of Systems  Respiratory: Positive for shortness of breath.   All other systems reviewed and are negative.   Allergies  Black pepper  Home Medications   Current Outpatient Rx  Name Route Sig Dispense Refill  . ALBUTEROL SULFATE HFA 108 (90 BASE) MCG/ACT IN  AERS Inhalation Inhale 2 puffs into the lungs every 4 (four) hours as needed.    Marland Kitchen LORAZEPAM 1 MG PO TABS Oral Take 1 mg by mouth 2 (two) times daily as needed.    Marland Kitchen TIOTROPIUM BROMIDE MONOHYDRATE 18 MCG IN CAPS Inhalation Place 18 mcg into inhaler and inhale daily.      BP 110/70  Pulse 92  Temp(Src) 97.3 F (36.3 C) (Oral)  Resp 20  SpO2 96%  Physical Exam  Nursing note and vitals reviewed. Constitutional: He is oriented to person, place, and time. He appears well-developed.  HENT:  Head: Normocephalic and atraumatic.  Eyes: Conjunctivae and EOM are normal. No scleral icterus.  Neck: Neck supple. No thyromegaly present.  Cardiovascular: Normal rate and regular rhythm.  Exam reveals no gallop and no friction rub.   No murmur heard. Pulmonary/Chest: No stridor. He has wheezes (Minimal wheezing ). He has no rales. He exhibits no tenderness.  Abdominal: He exhibits no distension. There is no tenderness. There is no rebound.  Musculoskeletal: Normal range of motion. He exhibits no edema.  Lymphadenopathy:    He has no cervical adenopathy.  Neurological: He is oriented to person, place, and time. Coordination normal.  Skin: No rash noted. No erythema.  Psychiatric: He has a normal mood and affect. His behavior is normal.    ED Course  Procedures (  including critical care time) DIAGNOSTIC STUDIES: Oxygen Saturation is 96% on room air, normal by my interpretation.    COORDINATION OF CARE:  Labs Reviewed - No data to display No results found.   No diagnosis found.    MDM   Copd,   The chart was scribed for me under my direct supervision.  I personally performed the history, physical, and medical decision making and all procedures in the evaluation of this patient.[name], MD 01/01/12 [phone]

## 2011-12-29 NOTE — ED Notes (Signed)
Pt states he is only here because his inhaler is out and he wants another one.  States his pharmacy will not refill his until tomorrow.

## 2011-12-29 NOTE — ED Notes (Signed)
Pt states he wants another inhaler. States he was told at the drug store he could not get one until tomorrow

## 2011-12-29 NOTE — Discharge Instructions (Signed)
Follow up with your md as needed °

## 2011-12-29 NOTE — ED Notes (Signed)
Pt DC to home in nad and steady gait

## 2011-12-31 NOTE — Progress Notes (Signed)
This encounter was created in error - please disregard.

## 2012-01-06 ENCOUNTER — Emergency Department (HOSPITAL_COMMUNITY)
Admission: EM | Admit: 2012-01-06 | Discharge: 2012-01-07 | Disposition: A | Discharge status: Home / Self Care | Payer: PRIVATE HEALTH INSURANCE | Attending: Emergency Medicine | Admitting: Emergency Medicine

## 2012-01-06 ENCOUNTER — Encounter (HOSPITAL_COMMUNITY): Payer: Self-pay

## 2012-01-06 DIAGNOSIS — J449 Chronic obstructive pulmonary disease, unspecified: Secondary | ICD-10-CM | POA: Insufficient documentation

## 2012-01-06 DIAGNOSIS — Z87891 Personal history of nicotine dependence: Secondary | ICD-10-CM | POA: Insufficient documentation

## 2012-01-06 DIAGNOSIS — K921 Melena: Secondary | ICD-10-CM | POA: Insufficient documentation

## 2012-01-06 DIAGNOSIS — J4489 Other specified chronic obstructive pulmonary disease: Secondary | ICD-10-CM | POA: Insufficient documentation

## 2012-01-06 DIAGNOSIS — K5900 Constipation, unspecified: Secondary | ICD-10-CM | POA: Insufficient documentation

## 2012-01-06 DIAGNOSIS — K625 Hemorrhage of anus and rectum: Secondary | ICD-10-CM

## 2012-01-06 DIAGNOSIS — F259 Schizoaffective disorder, unspecified: Secondary | ICD-10-CM | POA: Insufficient documentation

## 2012-01-06 DIAGNOSIS — F1010 Alcohol abuse, uncomplicated: Secondary | ICD-10-CM | POA: Insufficient documentation

## 2012-01-06 DIAGNOSIS — I10 Essential (primary) hypertension: Secondary | ICD-10-CM | POA: Insufficient documentation

## 2012-01-06 DIAGNOSIS — F411 Generalized anxiety disorder: Secondary | ICD-10-CM | POA: Insufficient documentation

## 2012-01-06 DIAGNOSIS — R000 Tachycardia, unspecified: Secondary | ICD-10-CM | POA: Insufficient documentation

## 2012-01-06 NOTE — ED Notes (Signed)
Pt presents with constipation x 2 days. Pt states his last normal BM was 4 months ago. Pt is very intoxicated. Pt talking about drinking beer to change color of stool.

## 2012-01-06 NOTE — ED Notes (Signed)
Pt states "we are not going to take any [place] blood from him. Pt states he is going home. Dr Ethelda Chick advises pt the need for testing, Pt cursing & saying he is not going to give any blood. EDP advises pt to follow w/ Dr Juanetta Gosling.

## 2012-01-06 NOTE — ED Notes (Signed)
Pt reports his stools are not normal. Reports probing rectum w/ his finger to remove stool. Denies any black stools. Pt states he has drank 1 24oz beers tonight

## 2012-01-07 ENCOUNTER — Emergency Department (HOSPITAL_COMMUNITY): Payer: PRIVATE HEALTH INSURANCE

## 2012-01-07 ENCOUNTER — Encounter (HOSPITAL_COMMUNITY): Payer: Self-pay

## 2012-01-07 ENCOUNTER — Other Ambulatory Visit: Payer: Self-pay

## 2012-01-07 ENCOUNTER — Emergency Department (HOSPITAL_COMMUNITY)
Admission: EM | Admit: 2012-01-07 | Discharge: 2012-01-07 | Disposition: A | Discharge status: Home / Self Care | Payer: PRIVATE HEALTH INSURANCE | Attending: Emergency Medicine | Admitting: Emergency Medicine

## 2012-01-07 DIAGNOSIS — I10 Essential (primary) hypertension: Secondary | ICD-10-CM | POA: Insufficient documentation

## 2012-01-07 DIAGNOSIS — J4489 Other specified chronic obstructive pulmonary disease: Secondary | ICD-10-CM | POA: Insufficient documentation

## 2012-01-07 DIAGNOSIS — F17200 Nicotine dependence, unspecified, uncomplicated: Secondary | ICD-10-CM | POA: Insufficient documentation

## 2012-01-07 DIAGNOSIS — D649 Anemia, unspecified: Secondary | ICD-10-CM | POA: Insufficient documentation

## 2012-01-07 DIAGNOSIS — F259 Schizoaffective disorder, unspecified: Secondary | ICD-10-CM | POA: Insufficient documentation

## 2012-01-07 DIAGNOSIS — F411 Generalized anxiety disorder: Secondary | ICD-10-CM | POA: Insufficient documentation

## 2012-01-07 DIAGNOSIS — R0602 Shortness of breath: Secondary | ICD-10-CM | POA: Insufficient documentation

## 2012-01-07 DIAGNOSIS — J449 Chronic obstructive pulmonary disease, unspecified: Secondary | ICD-10-CM | POA: Insufficient documentation

## 2012-01-07 DIAGNOSIS — R079 Chest pain, unspecified: Secondary | ICD-10-CM | POA: Insufficient documentation

## 2012-01-07 LAB — CBC
HCT: 25.5 % — ABNORMAL LOW (ref 39.0–52.0)
MCV: 92.1 fL (ref 78.0–100.0)
Platelets: 302 10*3/uL (ref 150–400)
RBC: 2.77 MIL/uL — ABNORMAL LOW (ref 4.22–5.81)
WBC: 3.8 10*3/uL — ABNORMAL LOW (ref 4.0–10.5)

## 2012-01-07 LAB — DIFFERENTIAL
Basophils Absolute: 0 10*3/uL (ref 0.0–0.1)
Eosinophils Relative: 3 % (ref 0–5)
Lymphocytes Relative: 25 % (ref 12–46)
Lymphs Abs: 0.9 10*3/uL (ref 0.7–4.0)
Neutro Abs: 2.2 10*3/uL (ref 1.7–7.7)

## 2012-01-07 LAB — COMPREHENSIVE METABOLIC PANEL
ALT: 13 U/L (ref 0–53)
AST: 32 U/L (ref 0–37)
Alkaline Phosphatase: 64 U/L (ref 39–117)
CO2: 26 mEq/L (ref 19–32)
Calcium: 9.3 mg/dL (ref 8.4–10.5)
Chloride: 102 mEq/L (ref 96–112)
GFR calc Af Amer: >90 mL/min (ref >90)
GFR calc non Af Amer: 82 mL/min — ABNORMAL LOW (ref >90)
Glucose, Bld: 107 mg/dL — ABNORMAL HIGH (ref 70–99)
Sodium: 136 mEq/L (ref 135–145)
Total Bilirubin: 0.2 mg/dL — ABNORMAL LOW (ref 0.3–1.2)

## 2012-01-07 NOTE — ED Notes (Signed)
When pt questioned on reason for ER visit - pt states he needs something to eat so he can take his geritol. Pt in no acute distress, speaking complete sentences, pt w/ hx of COPD, mild exp wheeze. Pt given crackers and diet ginger ale. Dr. Judd Lien aware.

## 2012-01-07 NOTE — ED Notes (Signed)
Pt requesting to leave at present, pt states room was too cold and would like staff to call his son. Son called by Steward Drone, RN - pt in no acute distress, speaking complete sentences, pleasant and cooperative at this time. Pt encouraged to seek medical help if symptoms worsen.

## 2012-01-07 NOTE — ED Notes (Signed)
Pt c/o room being too cold and wants to leave - Dr. Judd Lien aware.

## 2012-01-07 NOTE — ED Notes (Signed)
Pt presents with SOB and wheezing that started today.

## 2012-01-07 NOTE — ED Notes (Signed)
Pt states he was not waiting for the EDP to finish any [place] papers. Pt left w/o singing any paperwork

## 2012-01-07 NOTE — ED Provider Notes (Addendum)
History     CSN: [id number]  Arrival date & time 01/06/12  2145   First MD Initiated Contact with Patient 01/06/12 2348      Chief Complaint  Patient presents with  . Constipation    (Consider location/radiation/quality/duration/timing/severity/associated sxs/prior treatment) HPI Complains of abnormal bowel movement feels constipated last bowel movement earlier today. Denies abdominal pain or pain anywhere denies blood per him no fever no other complaint no other associated symptoms Past Medical History  Diagnosis Date  . COPD (chronic obstructive pulmonary disease)   . Anxiety   . Alcohol abuse   . Anemia   . Generalized headaches   . Hypertension   . AV malformation of GI tract   . Pulmonary nodule/lesion, solitary   . Schizoaffective disorder     Past Surgical History  Procedure Date  . No past surgeries     No family history on file.  History  Substance Use Topics  . Smoking status: Former Smoker    Quit date: 05/07/1969  . Smokeless tobacco: Current User    Types: Snuff  . Alcohol Use: 2.4 oz/week    4 Cans of beer per week     ETOH      Review of Systems  Constitutional: Negative.   HENT: Negative.   Respiratory: Negative.   Cardiovascular: Negative.   Gastrointestinal: Positive for constipation.  Musculoskeletal: Negative.   Skin: Negative.   Neurological: Negative.   Hematological: Negative.   Psychiatric/Behavioral: Negative.     Allergies  Black pepper  Home Medications   Current Outpatient Rx  Name Route Sig Dispense Refill  . ALBUTEROL SULFATE HFA 108 (90 BASE) MCG/ACT IN AERS Inhalation Inhale 2 puffs into the lungs every 6 (six) hours as needed. For shortness of breath    . LORAZEPAM 1 MG PO TABS Oral Take 1 mg by mouth 2 (two) times daily as needed.    Marland Kitchen UNKNOWN TO PATIENT Oral Take 1-2 tablets by mouth as needed. For pain **Patient states that tablet is red**    . TIOTROPIUM BROMIDE MONOHYDRATE 18 MCG IN CAPS Inhalation Place 18  mcg into inhaler and inhale daily.      BP 128/79  Pulse 106  Temp(Src) 98.4 F (36.9 C) (Oral)  Resp 20  Wt 124 lb (56.246 kg)  SpO2 92%  Physical Exam  Constitutional: He is oriented to person, place, and time. He appears well-developed and well-nourished.  HENT:  Head: Normocephalic and atraumatic.  Eyes: Conjunctivae are normal. Pupils are equal, round, and reactive to light.  Neck: Neck supple. No tracheal deviation present. No thyromegaly present.  Cardiovascular: Normal rate and regular rhythm.   No murmur heard.      Mildly tach ycardic  Pulmonary/Chest: Effort normal and breath sounds normal.  Abdominal: Soft. Bowel sounds are normal. He exhibits no distension. There is no tenderness.  Genitourinary: Penis normal. Guaiac positive stool.       Stool brown Hemoccult positive  Musculoskeletal: Normal range of motion. He exhibits no edema and no tenderness.  Neurological: He is alert and oriented to person, place, and time. Coordination normal.       Ambulates without difficulty not lightheaded on standing. Patient is able to walk a straight line his speech is not slurred  Skin: Skin is warm and dry. No rash noted.  Psychiatric: He has a normal mood and affect.    ED Course  Procedures (including critical care time)  Labs Reviewed - No data to display No results found.  No diagnosis found.    MDM  Patient admits drinking alcohol tonight his speech is not slurred he is able to walk in a straight line without difficulty. He is perhaps mildly intoxicated at 12 midnight but I did not feel he warrants being held against his will. He is capable of making decisions. He refused blood work I explained him that he should be checked for cancer and should follow up with Dr. Juanetta Gosling. Patient did not wait for written instructions Diagnosis #1 constipation #2 trace heme occult positive stools        Doug Sou, MD 01/07/12 0009  Doug Sou, MD 01/07/12 0010

## 2012-01-07 NOTE — ED Notes (Signed)
Delo ,MD at bedside 

## 2012-01-07 NOTE — ED Notes (Signed)
Pt left w/o discharge papers or signing.

## 2012-01-07 NOTE — ED Provider Notes (Signed)
This chart was scribed for [name] Lyons, MD by Williemae Natter. The patient was seen in room APA17/APA17 at 8:01 PM.  CSN: [id number]  Arrival date & time 01/07/12  1945   First MD Initiated Contact with Patient 01/07/12 2000      Chief Complaint  Patient presents with  . Shortness of Breath  . Wheezing    (Consider location/radiation/quality/duration/timing/severity/associated sxs/prior treatment) Patient is a 78 y.o. male presenting with shortness of breath. The history is provided by the patient.  Shortness of Breath  The current episode started today. The onset was sudden. The problem occurs rarely. The problem has been gradually improving. The problem is mild. The symptoms are relieved by nothing. The symptoms are aggravated by nothing. Associated symptoms include chest pain and shortness of breath. There was no intake of a foreign body. He has been behaving normally. Recently, medical care has been given at this facility.   [name] is a 78 y.o. male who presents to the Emergency Department complaining of chest pain this morning. Pt says he woke with with some soreness in his chest. Pt treated with a pain pill with little to no relief. Pt also has some associated shortness of breath. He reports never having anything like this before. He has no history of heart problems and denies a cough. Pt has not eaten today.   Past Medical History  Diagnosis Date  . COPD (chronic obstructive pulmonary disease)   . Anxiety   . Alcohol abuse   . Anemia   . Generalized headaches   . Hypertension   . AV malformation of GI tract   . Pulmonary nodule/lesion, solitary   . Schizoaffective disorder     Past Surgical History  Procedure Date  . No past surgeries     No family history on file.  History  Substance Use Topics  . Smoking status: Former Smoker    Quit date: 05/07/1969  . Smokeless tobacco: Current User    Types: Snuff  . Alcohol Use: 2.4 oz/week    4 Cans of beer per  week     ETOH      Review of Systems  Respiratory: Positive for shortness of breath.   Cardiovascular: Positive for chest pain.   10 Systems reviewed and are negative for acute change except as noted in the HPI.  Allergies  Black pepper  Home Medications   Current Outpatient Rx  Name Route Sig Dispense Refill  . ALBUTEROL SULFATE HFA 108 (90 BASE) MCG/ACT IN AERS Inhalation Inhale 2 puffs into the lungs every 6 (six) hours as needed. For shortness of breath    . LORAZEPAM 1 MG PO TABS Oral Take 1 mg by mouth 2 (two) times daily as needed.    Marland Kitchen TIOTROPIUM BROMIDE MONOHYDRATE 18 MCG IN CAPS Inhalation Place 18 mcg into inhaler and inhale daily.    Marland Kitchen UNKNOWN TO PATIENT Oral Take 1-2 tablets by mouth as needed. For pain **Patient states that tablet is red**      BP 149/84  Pulse 105  Temp(Src) 97.9 F (36.6 C) (Oral)  Resp 22  Ht 5\' 4"  (1.626 m)  Wt 126 lb (57.153 kg)  BMI 21.63 kg/m2  SpO2 98%  Physical Exam  Nursing note and vitals reviewed. Constitutional: He is oriented to person, place, and time. He appears well-developed and well-nourished. No distress.  HENT:  Head: Normocephalic and atraumatic.  Neck: Normal range of motion. Neck supple.  Cardiovascular: Normal rate and regular rhythm.  Exam  reveals no gallop and no friction rub.   No murmur heard. Pulmonary/Chest: Effort normal and breath sounds normal. No respiratory distress.       Lungs clear and equal bilaterally  Abdominal: Soft. There is no tenderness.  Musculoskeletal: Normal range of motion.  Neurological: He is alert and oriented to person, place, and time.  Skin: Skin is warm and dry.  Psychiatric: He has a normal mood and affect. His behavior is normal.    ED Course  Procedures (including critical care time) DIAGNOSTIC STUDIES: Oxygen Saturation is 98% on room air, normal by my interpretation.    COORDINATION OF CARE: Medications - No data to display    Labs Reviewed - No data to  display No results found.   No diagnosis found.   Date: 01/08/2012  Rate: 88  Rhythm: normal sinus rhythm  QRS Axis: left  Intervals: normal  ST/T Wave abnormalities: nonspecific ST changes  Conduction Disutrbances:nonspecific intraventricular conduction delay  Narrative Interpretation:   Old EKG Reviewed: unchanged    MDM  This patient presented for eval of tightness in the chest that began this morning.  The ekg was unchanged and laboratory studies were initiated.  While waiting for these to return, he informed the nurse he was tired of waiting and signed out AMA.  He told the nurse he was only here for something to eat so that he could take his geritol.  The labs returned showing an anemia that was slightly worse than what it has been in the past.  He seems to come to the ED with great frequency with non-specific complaints and has eloped before.    I personally performed the services described in this documentation, which was scribed in my presence. The recorded information has been reviewed and considered.         [name] Lyons, MD 01/08/12 [id number]

## 2012-01-10 NOTE — Progress Notes (Signed)
UR Chart Review Completed  

## 2012-01-13 ENCOUNTER — Encounter (HOSPITAL_COMMUNITY): Payer: Self-pay

## 2012-01-13 ENCOUNTER — Emergency Department (HOSPITAL_COMMUNITY)
Admission: EM | Admit: 2012-01-13 | Discharge: 2012-01-13 | Disposition: A | Discharge status: Home / Self Care | Payer: PRIVATE HEALTH INSURANCE | Attending: Emergency Medicine | Admitting: Emergency Medicine

## 2012-01-13 ENCOUNTER — Other Ambulatory Visit: Payer: Self-pay

## 2012-01-13 DIAGNOSIS — I10 Essential (primary) hypertension: Secondary | ICD-10-CM | POA: Insufficient documentation

## 2012-01-13 DIAGNOSIS — Z79899 Other long term (current) drug therapy: Secondary | ICD-10-CM | POA: Insufficient documentation

## 2012-01-13 DIAGNOSIS — Z8659 Personal history of other mental and behavioral disorders: Secondary | ICD-10-CM | POA: Insufficient documentation

## 2012-01-13 DIAGNOSIS — R000 Tachycardia, unspecified: Secondary | ICD-10-CM | POA: Insufficient documentation

## 2012-01-13 DIAGNOSIS — F411 Generalized anxiety disorder: Secondary | ICD-10-CM | POA: Insufficient documentation

## 2012-01-13 DIAGNOSIS — J449 Chronic obstructive pulmonary disease, unspecified: Secondary | ICD-10-CM

## 2012-01-13 DIAGNOSIS — J4489 Other specified chronic obstructive pulmonary disease: Secondary | ICD-10-CM | POA: Insufficient documentation

## 2012-01-13 MED ORDER — ALBUTEROL SULFATE HFA 108 (90 BASE) MCG/ACT IN AERS
2.0000 | INHALATION_SPRAY | RESPIRATORY_TRACT | Status: DC | PRN
  Administered 2012-01-13: 2 via RESPIRATORY_TRACT
  Filled 2012-01-13: qty 6.7

## 2012-01-13 NOTE — ED Notes (Signed)
Patient states he will not let us draw blood from him.

## 2012-01-13 NOTE — ED Notes (Signed)
Patient given inhaler and discharge instructions. Stated he wanted to go home, refused to let me check vitals again.

## 2012-01-13 NOTE — ED Provider Notes (Signed)
History   This chart was scribed for Carleene Cooper III, MD by Charolett Bumpers . The patient was seen in room APA04/APA04 and the patient's care was started at 10:31pm.  CSN: [id number]  Arrival date & time 01/13/12  2039   First MD Initiated Contact with Patient 01/13/12 2227      Chief Complaint  Patient presents with  . Tachycardia    (Consider location/radiation/quality/duration/timing/severity/associated sxs/prior treatment) HPI [name] is a 78 y.o. male who presents to the Emergency Department complaining of constant, moderate tachycardia. Patient was unable to determine how long his heat beat has been high. Patient reports a h/o COPD and normally uses an inhaler. Patient denies being a smoker, and states that he drank alcohol prior to the tachycardia episode tonight. Upon entering the room, the patient states that he is feeling better and wants to go home.    Past Medical History  Diagnosis Date  . COPD (chronic obstructive pulmonary disease)   . Anxiety   . Alcohol abuse   . Anemia   . Generalized headaches   . Hypertension   . AV malformation of GI tract   . Pulmonary nodule/lesion, solitary   . Schizoaffective disorder     Past Surgical History  Procedure Date  . No past surgeries     No family history on file.  History  Substance Use Topics  . Smoking status: Former Smoker    Quit date: 05/07/1969  . Smokeless tobacco: Current User    Types: Snuff  . Alcohol Use: 2.4 oz/week    4 Cans of beer per week     ETOH      Review of Systems A complete 10 system review of systems was obtained and is otherwise negative except as noted in the HPI and PMH.   Allergies  Black pepper  Home Medications   Current Outpatient Rx  Name Route Sig Dispense Refill  . ALBUTEROL SULFATE HFA 108 (90 BASE) MCG/ACT IN AERS Inhalation Inhale 2 puffs into the lungs every 6 (six) hours as needed. For shortness of breath    . VAPOR INHALER IN Inhalation Inhale  1-2 puffs into the lungs as needed. For congestion    . ASPIRIN EC 325 MG PO TBEC Oral Take 325 mg by mouth daily.    Marland Kitchen DIAZEPAM 5 MG PO TABS Oral Take 5 mg by mouth 2 (two) times daily.    . ONE-A-DAY MENS 50+ ADVANTAGE PO Oral Take 1 tablet by mouth at bedtime.    Marland Kitchen TIOTROPIUM BROMIDE MONOHYDRATE 18 MCG IN CAPS Inhalation Place 18 mcg into inhaler and inhale daily.      BP 104/58  Pulse 96  Temp(Src) 98.1 F (36.7 C) (Oral)  Resp 18  Wt 126 lb 3.2 oz (57.244 kg)  SpO2 99%  Physical Exam  Nursing note and vitals reviewed. Constitutional: He is oriented to person, place, and time. He appears well-developed and well-nourished. No distress.  HENT:  Head: Normocephalic and atraumatic.  Right Ear: External ear normal.  Left Ear: External ear normal.  Nose: Nose normal.  Mouth/Throat: Oropharynx is clear and moist.  Eyes: EOM are normal. Pupils are equal, round, and reactive to light.  Neck: Normal range of motion. Neck supple. No tracheal deviation present.  Cardiovascular: Normal rate, regular rhythm and normal heart sounds.  Exam reveals no gallop and no friction rub.   No murmur heard. Pulmonary/Chest: Effort normal and breath sounds normal. No respiratory distress. He has no wheezes. He has  no rales.  Abdominal: Soft. Bowel sounds are normal. He exhibits no distension. There is no tenderness.  Musculoskeletal: Normal range of motion. He exhibits no edema.  Neurological: He is alert and oriented to person, place, and time. No sensory deficit.  Skin: Skin is warm and dry.  Psychiatric: He has a normal mood and affect. His behavior is normal.    ED Course  Procedures (including critical care time)  DIAGNOSTIC STUDIES: Oxygen Saturation is 99% on room air, normal by my interpretation.    COORDINATION OF CARE: 2234: Medication Orders: Albuterol 108 mcg/act inhaler 2 puff 2235: Patient was informed that EKG results were normal. Patient wants to leave immediately because he is  feeling better. Will give the patient albuterol and d/c.   Pt with a prior similar visit recently, where he came in, became annoyed by having to wait, and left. Exam today shows him in no distress, exam negative, and EKG normal.  Given an albuterol inhaler and released.  1. COPD (chronic obstructive pulmonary disease)     I personally performed the services described in this documentation, which was scribed in my presence. The recorded information has been reviewed and considered.  Osvaldo Human, M.D.   Carleene Cooper III, MD 01/14/12 1250

## 2012-01-13 NOTE — ED Provider Notes (Signed)
9:06 PM  Date: 01/13/2012  Rate: 86  Rhythm: normal sinus rhythm  QRS Axis: normal  Intervals: normal  ST/T Wave abnormalities: normal  Conduction Disutrbances:none  Narrative Interpretation: Normal EKG.  Old EKG Reviewed: none available       Carleene Cooper III, MD 01/13/12 2107

## 2012-01-13 NOTE — ED Notes (Addendum)
Pt presents with intermittent "fast heart beat". Pt unable to note how long heart beat has been high. HR is 96 in triage.

## 2012-01-13 NOTE — Discharge Instructions (Signed)

## 2012-01-14 ENCOUNTER — Encounter (HOSPITAL_COMMUNITY): Payer: Self-pay

## 2012-01-14 ENCOUNTER — Emergency Department (HOSPITAL_COMMUNITY)
Admission: EM | Admit: 2012-01-14 | Discharge: 2012-01-14 | Disposition: A | Discharge status: Home / Self Care | Payer: PRIVATE HEALTH INSURANCE | Attending: Emergency Medicine | Admitting: Emergency Medicine

## 2012-01-14 ENCOUNTER — Other Ambulatory Visit: Payer: Self-pay

## 2012-01-14 ENCOUNTER — Emergency Department (HOSPITAL_COMMUNITY)
Admission: EM | Admit: 2012-01-14 | Discharge: 2012-01-15 | Disposition: A | Discharge status: Home / Self Care | Payer: PRIVATE HEALTH INSURANCE | Attending: Emergency Medicine | Admitting: Emergency Medicine

## 2012-01-14 ENCOUNTER — Encounter (HOSPITAL_COMMUNITY): Payer: Self-pay | Admitting: *Deleted

## 2012-01-14 DIAGNOSIS — R51 Headache: Secondary | ICD-10-CM | POA: Insufficient documentation

## 2012-01-14 DIAGNOSIS — J4489 Other specified chronic obstructive pulmonary disease: Secondary | ICD-10-CM | POA: Insufficient documentation

## 2012-01-14 DIAGNOSIS — D649 Anemia, unspecified: Secondary | ICD-10-CM | POA: Insufficient documentation

## 2012-01-14 DIAGNOSIS — F17200 Nicotine dependence, unspecified, uncomplicated: Secondary | ICD-10-CM | POA: Insufficient documentation

## 2012-01-14 DIAGNOSIS — I10 Essential (primary) hypertension: Secondary | ICD-10-CM | POA: Insufficient documentation

## 2012-01-14 DIAGNOSIS — R42 Dizziness and giddiness: Secondary | ICD-10-CM

## 2012-01-14 DIAGNOSIS — Z91018 Allergy to other foods: Secondary | ICD-10-CM | POA: Insufficient documentation

## 2012-01-14 DIAGNOSIS — J449 Chronic obstructive pulmonary disease, unspecified: Secondary | ICD-10-CM | POA: Insufficient documentation

## 2012-01-14 DIAGNOSIS — F1010 Alcohol abuse, uncomplicated: Secondary | ICD-10-CM | POA: Insufficient documentation

## 2012-01-14 DIAGNOSIS — Z87891 Personal history of nicotine dependence: Secondary | ICD-10-CM | POA: Insufficient documentation

## 2012-01-14 DIAGNOSIS — Z79899 Other long term (current) drug therapy: Secondary | ICD-10-CM | POA: Insufficient documentation

## 2012-01-14 DIAGNOSIS — F259 Schizoaffective disorder, unspecified: Secondary | ICD-10-CM | POA: Insufficient documentation

## 2012-01-14 DIAGNOSIS — F411 Generalized anxiety disorder: Secondary | ICD-10-CM | POA: Insufficient documentation

## 2012-01-14 DIAGNOSIS — R000 Tachycardia, unspecified: Secondary | ICD-10-CM | POA: Insufficient documentation

## 2012-01-14 DIAGNOSIS — Z7982 Long term (current) use of aspirin: Secondary | ICD-10-CM | POA: Insufficient documentation

## 2012-01-14 NOTE — ED Provider Notes (Signed)
History     CSN: [id number]  Arrival date & time 01/14/12  2024   First MD Initiated Contact with Patient 01/14/12 2259      Chief Complaint  Patient presents with  . Tachycardia    (Consider location/radiation/quality/duration/timing/severity/associated sxs/prior treatment) HPI  [name] is a 78 y.o. male who presents to the Emergency Department complaining of rapid heart rate. He reports he was sitting at home watching television when his heart began to race. He experienced some shortness of breath. He denies fever, chills, nausea, chest pain, diarrhea, vomiting. He states he's had similar episodes in the past. He is taking his medicines as directed.  PCP Dr. Juanetta Gosling  Past Medical History  Diagnosis Date  . COPD (chronic obstructive pulmonary disease)   . Anxiety   . Alcohol abuse   . Anemia   . Generalized headaches   . Hypertension   . AV malformation of GI tract   . Pulmonary nodule/lesion, solitary   . Schizoaffective disorder     Past Surgical History  Procedure Date  . No past surgeries     No family history on file.  History  Substance Use Topics  . Smoking status: Former Smoker    Quit date: 05/07/1969  . Smokeless tobacco: Current User    Types: Snuff  . Alcohol Use: 2.4 oz/week    4 Cans of beer per week     ETOH      Review of Systems  Constitutional: Negative for fever.       10 Systems reviewed and are negative for acute change except as noted in the HPI.  HENT: Negative for congestion.   Eyes: Negative for discharge and redness.  Respiratory: Negative for cough and shortness of breath.   Cardiovascular: Negative for chest pain.  Gastrointestinal: Negative for vomiting and abdominal pain.  Musculoskeletal: Negative for back pain.  Skin: Negative for rash.  Neurological: Negative for syncope, numbness and headaches.  Psychiatric/Behavioral:       No behavior change.    Allergies  Black pepper  Home Medications   Current  Outpatient Rx  Name Route Sig Dispense Refill  . ALBUTEROL SULFATE HFA 108 (90 BASE) MCG/ACT IN AERS Inhalation Inhale 2 puffs into the lungs every 6 (six) hours as needed. For shortness of breath    . VAPOR INHALER IN Inhalation Inhale 1-2 puffs into the lungs as needed. For congestion    . ASPIRIN EC 325 MG PO TBEC Oral Take 325 mg by mouth daily.    Marland Kitchen DIAZEPAM 5 MG PO TABS Oral Take 5 mg by mouth 2 (two) times daily.    . ONE-A-DAY MENS 50+ ADVANTAGE PO Oral Take 1 tablet by mouth at bedtime.    Marland Kitchen TIOTROPIUM BROMIDE MONOHYDRATE 18 MCG IN CAPS Inhalation Place 18 mcg into inhaler and inhale daily.      BP 99/48  Pulse 111  Temp(Src) 98.3 F (36.8 C) (Oral)  Resp 18  Ht 5\' 5"  (1.651 m)  Wt 140 lb (63.504 kg)  BMI 23.30 kg/m2  SpO2 98%  Physical Exam  Nursing note and vitals reviewed. Constitutional:       Awake, alert, nontoxic appearance.  HENT:  Head: Atraumatic.  Eyes: Right eye exhibits no discharge. Left eye exhibits no discharge.  Neck: Neck supple.  Pulmonary/Chest: Effort normal. He exhibits no tenderness.  Abdominal: Soft. There is no tenderness. There is no rebound.  Musculoskeletal: He exhibits no tenderness.       Baseline ROM, no  obvious new focal weakness.  Neurological:       Mental status and motor strength appears baseline for patient and situation.  Skin: No rash noted.  Psychiatric: He has a normal mood and affect.    ED Course  Procedures (including critical care time)  2317 Patient ambulated in the hallway without difficulty heart rate remained below 100.   MDM  Patient here with complaint of rapid heart rate. Rate  has been in the normal range throughout his time in the emergency room. He is to follow up with Dr. Juanetta Gosling.Pt stable in ED with no significant deterioration in condition.The patient appears reasonably screened and/or stabilized for discharge and I doubt any other medical condition or other [facility] requiring further screening, evaluation, or  treatment in the ED at this time prior to discharge.  MDM Reviewed: nursing note and vitals           Nicoletta Dress. Colon Branch, MD 01/14/12 2318

## 2012-01-14 NOTE — ED Notes (Signed)
Refusing to have blood work drawn.

## 2012-01-14 NOTE — ED Notes (Signed)
Pt still refusing blood work.  Delay explained to pt.  Pt states " I'm fixing to walk my black ass out of here and get my son to pick me up and take me to [facility] to be seen."  Explained delay again.  Pt verbalized understanding.  Pt reports dizziness.  Requesting to wait in waiting room.  Explained to pt if he waits in waiting room, he might miss seeing the edp.  Verbalized understanding.  Pt sitting on side of bed at this time eating crackers and drinking water.  nad noted.

## 2012-01-14 NOTE — ED Notes (Signed)
Pt ambulated without diff.   

## 2012-01-14 NOTE — ED Notes (Signed)
Pt states high heart rate this morning. Pt states, "yall ain't taking any blood from me either"

## 2012-01-14 NOTE — Discharge Instructions (Signed)
If you continue to have episodes of rapid heart rate, followup with Dr. Abbe Amsterdam. Take your medicines as directed.

## 2012-01-14 NOTE — ED Notes (Signed)
Pt left AMA without notifying staff.  

## 2012-01-14 NOTE — ED Provider Notes (Signed)
History     CSN: [id number]  Arrival date & time 01/14/12  1200   First MD Initiated Contact with Patient 01/14/12 1405      Chief Complaint  Patient presents with  . Tachycardia     The history is provided by the patient. History Limited By: uncooperative.  Pt was seen at 1415.  Per pt, c/o gradual onset and persistence of constant "dizziness" that began an unknown time ago.  Pt will not explain his symptoms further.  Denies CP/SOB, no cough, no abd pain, no N/V/D, no fevers, no focal motor weakness, no tingling/numbness in extremities.       Past Medical History  Diagnosis Date  . COPD (chronic obstructive pulmonary disease)   . Anxiety   . Alcohol abuse   . Anemia   . Generalized headaches   . Hypertension   . AV malformation of GI tract   . Pulmonary nodule/lesion, solitary   . Schizoaffective disorder     Past Surgical History  Procedure Date  . No past surgeries       History  Substance Use Topics  . Smoking status: Former Smoker    Quit date: 05/07/1969  . Smokeless tobacco: Current User    Types: Snuff  . Alcohol Use: 2.4 oz/week    4 Cans of beer per week     ETOH      Review of Systems  Unable to perform ROS: Other    Allergies  Black pepper  Home Medications   Current Outpatient Rx  Name Route Sig Dispense Refill  . ALBUTEROL SULFATE HFA 108 (90 BASE) MCG/ACT IN AERS Inhalation Inhale 2 puffs into the lungs every 6 (six) hours as needed. For shortness of breath    . VAPOR INHALER IN Inhalation Inhale 1-2 puffs into the lungs as needed. For congestion    . ASPIRIN EC 325 MG PO TBEC Oral Take 325 mg by mouth daily.    Marland Kitchen DIAZEPAM 5 MG PO TABS Oral Take 5 mg by mouth 2 (two) times daily.    . ONE-A-DAY MENS 50+ ADVANTAGE PO Oral Take 1 tablet by mouth at bedtime.    Marland Kitchen TIOTROPIUM BROMIDE MONOHYDRATE 18 MCG IN CAPS Inhalation Place 18 mcg into inhaler and inhale daily.      BP 114/84  Pulse 108  Temp(Src) 98 F (36.7 C) (Oral)  Resp 22   Ht 5\' 4"  (1.626 m)  Wt 140 lb (63.504 kg)  BMI 24.03 kg/m2  SpO2 98%  Physical Exam 1420: Physical examination:  Nursing notes reviewed; Vital signs and O2 SAT reviewed;  Constitutional: Well developed, Well nourished, Well hydrated, In no acute distress; Head:  Normocephalic, atraumatic; Eyes: EOMI, PERRL, No scleral icterus; ENMT: Mouth and pharynx normal, Mucous membranes moist; Neck: Supple, Full range of motion, No lymphadenopathy; Cardiovascular: Regular rate and rhythm, No murmur or gallop; Respiratory: Breath sounds coarse & equal bilaterally, No rales, rhonchi, wheezes, Normal respiratory effort/excursion; Chest: Nontender, Movement normal; Extremities: Pulses normal, No tenderness, No edema, No calf edema or asymmetry.; Neuro: AA&Ox3, Major CN grossly intact. Gait steady, no facial droop, speech clear. No gross focal motor or sensory deficits in extremities.; Skin: Color normal, Warm, Dry; Psych:  Hostile, uncooperative.    ED Course  Procedures    MDM  MDM Reviewed: previous chart, nursing note and vitals Reviewed previous: labs and ECG Interpretation: ECG    Date: 01/14/2012  Rate: 92  Rhythm: normal sinus rhythm  QRS Axis: right  Intervals: normal  ST/T Wave abnormalities: normal  Conduction Disutrbances:none  Narrative Interpretation:   Old EKG Reviewed: changes noted +arm lead reversal on today's EKG compared to previous EKG dated 01/07/2012.    1420:   Pt chart review reveals pt is often in the ED and leaves refusing further testing or complaining about waiting; most recently as of last night.  Pt has eaten and drank food/fluids while in the ED without distress.  No N/V, resps easy, climbing on and off stretcher by himself without difficulty.  Pt refuses any dx testing including repeat EKG (due to limb lead reversal), CXR, labs.  Pt states he "isn't going to let you people draw any blood from me" and "I don't care even if you call the cops you're not getting blood from  me!"  Pt belligerent and uncooperative.  States he is "getting my black ass out of here."  Pt informed that I cannot discern what is wrong with him if I do not perform some sort of dx testing.  Pt states he "doesn't care" and "I'm out of here."  I encouraged pt to stay and have testing completed, continues to refuse.  Pt makes his own medical decisions.  Risks of AMA explained to pt, including, but not limited to:  stroke, heart attack, cardiac arrythmia ("irregular heart rate/beat"), "passing out," temporary and/or permanent disability, death.  Pt verb understanding and continue to refuse any testing, understanding the consequences of his decision.  I encouraged pt to follow up with his PMD on Monday and return to the ED immediately if symptoms return, he changes his mind about having testing completed, or for any other concerns.          Laray Anger, DO 01/15/12 1216

## 2012-01-14 NOTE — ED Notes (Signed)
Patient ambulated to E.R. Desk and back to room - no problems walking - no assistance was needed.

## 2012-01-14 NOTE — ED Notes (Addendum)
Pt presents with " heart beating fast". Pt left AMA earlier today. Pt was seen here last night and today for same complaint.

## 2012-01-16 ENCOUNTER — Encounter (HOSPITAL_COMMUNITY): Payer: Self-pay | Admitting: *Deleted

## 2012-01-16 ENCOUNTER — Other Ambulatory Visit: Payer: Self-pay

## 2012-01-16 ENCOUNTER — Emergency Department (HOSPITAL_COMMUNITY)
Admission: EM | Admit: 2012-01-16 | Discharge: 2012-01-16 | Disposition: A | Discharge status: Home / Self Care | Payer: PRIVATE HEALTH INSURANCE | Attending: Emergency Medicine | Admitting: Emergency Medicine

## 2012-01-16 DIAGNOSIS — F259 Schizoaffective disorder, unspecified: Secondary | ICD-10-CM | POA: Insufficient documentation

## 2012-01-16 DIAGNOSIS — R42 Dizziness and giddiness: Secondary | ICD-10-CM | POA: Insufficient documentation

## 2012-01-16 DIAGNOSIS — Z139 Encounter for screening, unspecified: Secondary | ICD-10-CM

## 2012-01-16 DIAGNOSIS — F411 Generalized anxiety disorder: Secondary | ICD-10-CM | POA: Insufficient documentation

## 2012-01-16 DIAGNOSIS — J449 Chronic obstructive pulmonary disease, unspecified: Secondary | ICD-10-CM | POA: Insufficient documentation

## 2012-01-16 DIAGNOSIS — R002 Palpitations: Secondary | ICD-10-CM | POA: Insufficient documentation

## 2012-01-16 DIAGNOSIS — I10 Essential (primary) hypertension: Secondary | ICD-10-CM | POA: Insufficient documentation

## 2012-01-16 DIAGNOSIS — R000 Tachycardia, unspecified: Secondary | ICD-10-CM | POA: Insufficient documentation

## 2012-01-16 DIAGNOSIS — D649 Anemia, unspecified: Secondary | ICD-10-CM | POA: Insufficient documentation

## 2012-01-16 DIAGNOSIS — F17200 Nicotine dependence, unspecified, uncomplicated: Secondary | ICD-10-CM | POA: Insufficient documentation

## 2012-01-16 DIAGNOSIS — R0602 Shortness of breath: Secondary | ICD-10-CM | POA: Insufficient documentation

## 2012-01-16 DIAGNOSIS — J4489 Other specified chronic obstructive pulmonary disease: Secondary | ICD-10-CM | POA: Insufficient documentation

## 2012-01-16 NOTE — ED Notes (Signed)
Pt c/o "my heartbeat beating fast." pt was seen at [facility] last pm and signed out ama according to pt,

## 2012-01-16 NOTE — Discharge Instructions (Signed)
Please followup with Dr. Juanetta Gosling tomorrow. If you have any other concerns, he may always return for reevaluation. I advised that if you develop symptoms of chest pain, fainting that he do be reevaluated.

## 2012-01-16 NOTE — ED Provider Notes (Signed)
History     CSN: [id number]  Arrival date & time 01/16/12  1247   First MD Initiated Contact with Patient 01/16/12 1351      Chief Complaint  Patient presents with  . Shortness of Breath    (Consider location/radiation/quality/duration/timing/severity/associated sxs/prior treatment) HPI Comments: Patient tells me that he felt that his heart rate was rapid. He reports that he was laying in bed and felt fine. Once he stood up and started walking around he felt like his heart was beating fast. He reports he felt a little dizzy but denied headache, chest pain, shortness of breath, sweating, vomiting. He denies any recent black stools or diarrhea. He reports at present his symptoms are resolved. Per nursing, the patient has been seen both here as well as at [facility] last night, presumably for the same complaint.  The history is provided by the patient.    Past Medical History  Diagnosis Date  . COPD (chronic obstructive pulmonary disease)   . Anxiety   . Alcohol abuse   . Anemia   . Generalized headaches   . Hypertension   . AV malformation of GI tract   . Pulmonary nodule/lesion, solitary   . Schizoaffective disorder     Past Surgical History  Procedure Date  . No past surgeries     History reviewed. No pertinent family history.  History  Substance Use Topics  . Smoking status: Former Smoker    Quit date: 05/07/1969  . Smokeless tobacco: Current User    Types: Snuff  . Alcohol Use: 2.4 oz/week    4 Cans of beer per week     ETOH      Review of Systems  Constitutional: Negative for fever, chills and diaphoresis.  Respiratory: Negative for chest tightness and shortness of breath.   Cardiovascular: Negative for chest pain.  Gastrointestinal: Negative for nausea, vomiting, abdominal pain, diarrhea and blood in stool.  Musculoskeletal: Negative for back pain.  Neurological: Positive for dizziness.    Allergies  Black pepper  Home Medications    Current Outpatient Rx  Name Route Sig Dispense Refill  . ALBUTEROL SULFATE HFA 108 (90 BASE) MCG/ACT IN AERS Inhalation Inhale 2 puffs into the lungs every 6 (six) hours as needed. For shortness of breath    . VAPOR INHALER IN Inhalation Inhale 1-2 puffs into the lungs as needed. For congestion    . ASPIRIN EC 325 MG PO TBEC Oral Take 325 mg by mouth daily.    Marland Kitchen DIAZEPAM 5 MG PO TABS Oral Take 5 mg by mouth 2 (two) times daily.    . ONE-A-DAY MENS 50+ ADVANTAGE PO Oral Take 1 tablet by mouth at bedtime.    Marland Kitchen TIOTROPIUM BROMIDE MONOHYDRATE 18 MCG IN CAPS Inhalation Place 18 mcg into inhaler and inhale daily.      BP 96/62  Pulse 96  Temp(Src) 97.9 F (36.6 C) (Oral)  Resp 20  SpO2 99%  Physical Exam  Nursing note and vitals reviewed. Constitutional: He appears well-developed and well-nourished. No distress.  HENT:  Head: Normocephalic.  Neck: Neck supple.  Cardiovascular: Normal rate and regular rhythm.   No murmur heard. Pulmonary/Chest: Effort normal. No respiratory distress. He has no wheezes.  Abdominal: Soft. He exhibits no distension.  Neurological: He is alert.  Skin: Skin is warm and dry. He is not diaphoretic.  Psychiatric: His mood appears not anxious. His speech is delayed.    ED Course  Procedures (including critical care time)  Labs Reviewed -  No data to display No results found.   1. Screening     EKG at time 13:45 shows a normal sinus rhythm at a rate of 93. Normal intervals, normal axis, no ST or T-wave abnormalities.  MDM   Patient speaks very slowly and admits that he did take a diazepam 5 mg as prescribed by his regular doctor for anxiety and sleep aid prior to arrival. Since the patient's symptoms are resolved, this suggests that his symptoms may have been related to anxiety. Patient is known to be a long time alcoholic. He admits that he did bite 2 beers yesterday. He relates a story in which he also had some symptoms of rapid heartbeat yesterday  but resolved after drinking his first beer. I repeatedly asked the patient multiple times what he was concerned about especially now that his symptoms were resolved. He is very talkative and tangential although in a very delayed fashion. Finally he reports that he did feel better and that he wished to leave the emergency department and that he would followup with Dr. Juanetta Gosling tomorrow morning. Advised him that if he felt worse, develop chest pain or syncope that he is always free to return to the emergency department for further evaluation. He did not wish to stay for any blood tests, x-rays and certainly not for admission.        Gavin Pound. Nikolina Simerson, MD 01/16/12 1428

## 2012-01-16 NOTE — ED Notes (Signed)
C/o shortness of breath onset yesterday after drinking some type of alcoholic beverage

## 2012-01-17 ENCOUNTER — Emergency Department (HOSPITAL_COMMUNITY): Payer: PRIVATE HEALTH INSURANCE

## 2012-01-17 ENCOUNTER — Other Ambulatory Visit: Payer: Self-pay

## 2012-01-17 ENCOUNTER — Encounter (HOSPITAL_COMMUNITY): Payer: Self-pay

## 2012-01-17 ENCOUNTER — Encounter (HOSPITAL_COMMUNITY): Payer: Self-pay | Admitting: *Deleted

## 2012-01-17 ENCOUNTER — Inpatient Hospital Stay (HOSPITAL_COMMUNITY)
Admission: EM | Admit: 2012-01-17 | Discharge: 2012-01-19 | DRG: 378 | Discharge status: Against Medical Advice | Payer: PRIVATE HEALTH INSURANCE | Attending: Pulmonary Disease | Admitting: Pulmonary Disease

## 2012-01-17 ENCOUNTER — Emergency Department (HOSPITAL_COMMUNITY)
Admission: EM | Admit: 2012-01-17 | Discharge: 2012-01-17 | Disposition: A | Discharge status: Home / Self Care | Payer: PRIVATE HEALTH INSURANCE | Attending: Emergency Medicine | Admitting: Emergency Medicine

## 2012-01-17 DIAGNOSIS — K259 Gastric ulcer, unspecified as acute or chronic, without hemorrhage or perforation: Secondary | ICD-10-CM | POA: Diagnosis present

## 2012-01-17 DIAGNOSIS — J4489 Other specified chronic obstructive pulmonary disease: Secondary | ICD-10-CM | POA: Diagnosis present

## 2012-01-17 DIAGNOSIS — R002 Palpitations: Secondary | ICD-10-CM

## 2012-01-17 DIAGNOSIS — F411 Generalized anxiety disorder: Secondary | ICD-10-CM | POA: Diagnosis present

## 2012-01-17 DIAGNOSIS — D62 Acute posthemorrhagic anemia: Secondary | ICD-10-CM | POA: Diagnosis present

## 2012-01-17 DIAGNOSIS — Z79899 Other long term (current) drug therapy: Secondary | ICD-10-CM

## 2012-01-17 DIAGNOSIS — R911 Solitary pulmonary nodule: Secondary | ICD-10-CM | POA: Diagnosis present

## 2012-01-17 DIAGNOSIS — Z87891 Personal history of nicotine dependence: Secondary | ICD-10-CM

## 2012-01-17 DIAGNOSIS — Z7982 Long term (current) use of aspirin: Secondary | ICD-10-CM

## 2012-01-17 DIAGNOSIS — R42 Dizziness and giddiness: Secondary | ICD-10-CM

## 2012-01-17 DIAGNOSIS — K922 Gastrointestinal hemorrhage, unspecified: Secondary | ICD-10-CM

## 2012-01-17 DIAGNOSIS — J449 Chronic obstructive pulmonary disease, unspecified: Secondary | ICD-10-CM | POA: Diagnosis present

## 2012-01-17 DIAGNOSIS — K2100 Gastro-esophageal reflux disease with esophagitis, without bleeding: Secondary | ICD-10-CM | POA: Diagnosis present

## 2012-01-17 DIAGNOSIS — I10 Essential (primary) hypertension: Secondary | ICD-10-CM | POA: Diagnosis present

## 2012-01-17 DIAGNOSIS — F259 Schizoaffective disorder, unspecified: Secondary | ICD-10-CM | POA: Diagnosis present

## 2012-01-17 DIAGNOSIS — F1011 Alcohol abuse, in remission: Secondary | ICD-10-CM | POA: Diagnosis present

## 2012-01-17 DIAGNOSIS — D649 Anemia, unspecified: Secondary | ICD-10-CM

## 2012-01-17 LAB — CBC
HCT: 18 % — ABNORMAL LOW (ref 39.0–52.0)
Hemoglobin: 5.7 g/dL — CL (ref 13.0–17.0)
MCH: 28.8 pg (ref 26.0–34.0)
MCHC: 31.7 g/dL (ref 30.0–36.0)
MCV: 90.9 fL (ref 78.0–100.0)
RDW: 17.2 % — ABNORMAL HIGH (ref 11.5–15.5)

## 2012-01-17 LAB — BASIC METABOLIC PANEL
BUN: 14 mg/dL (ref 6–23)
Calcium: 9 mg/dL (ref 8.4–10.5)
Chloride: 101 mEq/L (ref 96–112)
Creatinine, Ser: 0.98 mg/dL (ref 0.50–1.35)
GFR calc Af Amer: 88 mL/min — ABNORMAL LOW (ref >90)

## 2012-01-17 LAB — DIFFERENTIAL
Basophils Absolute: 0 10*3/uL (ref 0.0–0.1)
Basophils Relative: 1 % (ref 0–1)
Eosinophils Relative: 3 % (ref 0–5)
Monocytes Absolute: 0.7 10*3/uL (ref 0.1–1.0)
Monocytes Relative: 18 % — ABNORMAL HIGH (ref 3–12)
Neutro Abs: 2.2 10*3/uL (ref 1.7–7.7)

## 2012-01-17 MED ORDER — IPRATROPIUM BROMIDE 0.02 % IN SOLN
0.5000 mg | RESPIRATORY_TRACT | Status: DC | PRN
  Administered 2012-01-18 (×2): 0.5 mg via RESPIRATORY_TRACT
  Filled 2012-01-17 (×2): qty 2.5

## 2012-01-17 MED ORDER — SODIUM CHLORIDE 0.9 % IV SOLN
8.0000 mg/h | INTRAVENOUS | Status: DC
  Filled 2012-01-17 (×5): qty 80

## 2012-01-17 MED ORDER — DIAZEPAM 5 MG PO TABS
5.0000 mg | ORAL_TABLET | Freq: Two times a day (BID) | ORAL | Status: DC
  Administered 2012-01-17 – 2012-01-19 (×4): 5 mg via ORAL
  Filled 2012-01-17 (×5): qty 1

## 2012-01-17 MED ORDER — ADULT MULTIVITAMIN W/MINERALS CH
1.0000 | ORAL_TABLET | Freq: Every day | ORAL | Status: DC
  Administered 2012-01-18: 1 via ORAL
  Filled 2012-01-17: qty 1

## 2012-01-17 MED ORDER — ALBUTEROL SULFATE (5 MG/ML) 0.5% IN NEBU
2.5000 mg | INHALATION_SOLUTION | RESPIRATORY_TRACT | Status: DC | PRN
  Administered 2012-01-18 (×2): 2.5 mg via RESPIRATORY_TRACT
  Filled 2012-01-17 (×2): qty 0.5

## 2012-01-17 MED ORDER — PANTOPRAZOLE SODIUM 40 MG IV SOLR
40.0000 mg | INTRAVENOUS | Status: DC
  Administered 2012-01-18: 40 mg via INTRAVENOUS
  Filled 2012-01-17 (×2): qty 40

## 2012-01-17 MED ORDER — SODIUM CHLORIDE 0.9 % IV BOLUS (SEPSIS)
1000.0000 mL | Freq: Once | INTRAVENOUS | Status: AC
  Administered 2012-01-17: 1000 mL via INTRAVENOUS

## 2012-01-17 MED ORDER — TIOTROPIUM BROMIDE MONOHYDRATE 18 MCG IN CAPS
18.0000 ug | ORAL_CAPSULE | Freq: Every day | RESPIRATORY_TRACT | Status: DC
  Administered 2012-01-18 – 2012-01-19 (×2): 18 ug via RESPIRATORY_TRACT
  Filled 2012-01-17: qty 5

## 2012-01-17 MED ORDER — IPRATROPIUM BROMIDE 0.02 % IN SOLN
0.5000 mg | Freq: Once | RESPIRATORY_TRACT | Status: AC
  Administered 2012-01-17: 0.5 mg via RESPIRATORY_TRACT
  Filled 2012-01-17: qty 2.5

## 2012-01-17 MED ORDER — PNEUMOCOCCAL VAC POLYVALENT 25 MCG/0.5ML IJ INJ
0.5000 mL | INJECTION | INTRAMUSCULAR | Status: AC
  Administered 2012-01-18: 0.5 mL via INTRAMUSCULAR
  Filled 2012-01-17: qty 0.5

## 2012-01-17 MED ORDER — PANTOPRAZOLE SODIUM 40 MG IV SOLR
40.0000 mg | Freq: Once | INTRAVENOUS | Status: AC
  Administered 2012-01-17: 40 mg via INTRAVENOUS
  Filled 2012-01-17: qty 40

## 2012-01-17 MED ORDER — ALBUTEROL SULFATE (5 MG/ML) 0.5% IN NEBU
2.5000 mg | INHALATION_SOLUTION | Freq: Once | RESPIRATORY_TRACT | Status: AC
  Administered 2012-01-17: 2.5 mg via RESPIRATORY_TRACT
  Filled 2012-01-17: qty 0.5

## 2012-01-17 MED ORDER — SODIUM CHLORIDE 0.9 % IV SOLN
Freq: Once | INTRAVENOUS | Status: AC
  Administered 2012-01-18: 04:00:00 via INTRAVENOUS

## 2012-01-17 NOTE — ED Notes (Signed)
CRITICAL VALUE ALERT  Critical value received:  HGB 5.7, HCT 18.0  Date of notification:  01/17/2012  Time of notification:  17:00  Critical value read back: yes  Nurse who received alert:  Juris Gosnell,RN  MD notified (1st page):  Dr, Manus Gunning  Time of first page:  17:00  MD notified (2nd page):  Time of second page:  Responding MD:  Dr. Manus Gunning  Time MD responded:  17:00

## 2012-01-17 NOTE — H&P (Signed)
NAME:  [name], [name]              ACCOUNT NO.:  [id number]  MEDICAL RECORD NO.:  [id number]  LOCATION:  A302                          FACILITY:  [facility]  PHYSICIAN:  Lauriel Helin D. Felecia Shelling, MD   DATE OF BIRTH:  [birth year]  DATE OF ADMISSION:  01/17/2012 DATE OF DISCHARGE:  LH                             HISTORY & PHYSICAL   CHIEF COMPLAINT:  Generalized weakness and dizziness.  HISTORY OF PRESENT ILLNESS:  This is a 78 year old male, patient of Dr. Kari Baars, came to emergency room with above complaint.  The patient was recently seen in emergency room several times with dizziness and weakness.  He also had shortness of breath.  The patient has a history of chronic obstructive pulmonary disease and alcohol abuse.  He was given nebulizer treatment; however, his symptoms did not improve. He had a lab test which showed a very low hemoglobin of 5.7 with hematocrit of 18.  His stool exam also showed tarry stool which was positive for occult blood.  The patient was started on IV fluid and typed and crossmatched and was admitted as a case of a GI bleed.  REVIEW OF SYSTEMS:  No fever, chills, cough, chest pain, nausea, vomiting, hematemesis, abdominal pain, dysuria, urgency, or frequency of urination.  PAST MEDICAL HISTORY: 1. Chronic obstructive pulmonary disease. 2. History of alcohol abuse. 3. Anemia. 4. Anxiety disorder. 5. Hypertension. 6. History of AV malformation of GI tract.  CURRENT MEDICATIONS: 1. ProAir 2 puffs every 6 hours p.r.n. 2. Aromatic inhaler p.r.n. 3. Aspirin 325 mg daily. 4. Multivitamin 1 tablet daily. 5. Spiriva 1 capsule daily. 6. Valium 5 mg daily.  SOCIAL HISTORY:  The patient is married.  He has history of alcohol and tobacco in the past.  Currently, the patient lives with his son.  No history of substance abuse.  FAMILY HISTORY:  Not available.  Both his mother and father has died.  PHYSICAL EXAMINATION:  GENERAL:  The patient is alert, awake, and  sick looking. VITAL SIGNS:  Blood pressure 98/62, pulse 96, respiratory rate 18, temperature 97.9 degrees Fahrenheit. HEENT:  Pupils are equal, reactive.  Conjunctivae pale. NECK:  Supple. CHEST:  Decreased air entry.  Few rhonchi. CARDIOVASCULAR:  First and second heart sounds heard.  No murmur.  No gallop. ABDOMEN:  Soft and lax.  Bowel sound is positive.  No mass or organomegaly. EXTREMITIES:  No leg edema.  LABORATORY DATA:  CBC, WBC 3.8, hemoglobin 5.7, hematocrit 18.0, platelets 396.  BMP, sodium 135, potassium 3.8, chloride 101, carbon dioxide 26, glucose 99, BUN 14, creatinine 0.9, calcium 9.0.  ASSESSMENT: 1. Acute GI bleed, etiology not clear.  However, the patient has a     history of arteriovenous malformation. 2. Anemia secondary to the above. 3. History of chronic obstructive pulmonary disease. 4. History of alcohol abuse. 5. History of anxiety disorder.  PLAN:  We will type and crossmatch and transfuse 3 units of packed red blood cells.  We will continue IV Protonix.  A GI consult has been requested and emergency room physician has talked to Dr. Jena Gauss, and also, we will continue IV fluid.  We will monitor his CBC every 12 hours.  Doloros Kwolek D. Felecia Shelling, MD     TDF/MEDQ  D:  01/17/2012  T:  01/17/2012  Job:  [id number]

## 2012-01-17 NOTE — ED Provider Notes (Signed)
History     CSN: [id number]  Arrival date & time 01/16/12  2315   First MD Initiated Contact with Patient 01/17/12 0135      Chief Complaint  Patient presents with  . Dizziness  . Weakness    (Consider location/radiation/quality/duration/timing/severity/associated sxs/prior treatment) HPI [name] is a 78 y.o. male who presents to the Emergency Department complaining of a rapid heart beat and dizziness. This is the second visit this patient has had today and one of over 7 visits to either [facility] ED or [facility] ED over the last three days. Each episode is a variation of the theme of tachycardia associated wither with shortness of breath or with dizziness. He has taken valium (prescribed) , had beer, taken a vicoden, all of which have relieved the episodes before his arrival in the ERs. He has on occasion been argumentative and uncooperative as well as flirtation with nursing and staff on occasion.  He has a history of drinking, sometimes heavily. Tonight his symptoms have resolved before arrival, relieved by a beer. He is c/o of needing someone to put cream on his back, get him something to drink and a snack. He denies fever, chills, chest pain, nausea, vomiting, diarrhea.  PCP Dr. Juanetta Gosling  Past Medical History  Diagnosis Date  . COPD (chronic obstructive pulmonary disease)   . Anxiety   . Alcohol abuse   . Anemia   . Generalized headaches   . Hypertension   . AV malformation of GI tract   . Pulmonary nodule/lesion, solitary   . Schizoaffective disorder     Past Surgical History  Procedure Date  . No past surgeries     History reviewed. No pertinent family history.  History  Substance Use Topics  . Smoking status: Former Smoker    Quit date: 05/07/1969  . Smokeless tobacco: Current User    Types: Snuff  . Alcohol Use: 2.4 oz/week    4 Cans of beer per week     ETOH      Review of Systems  Constitutional: Negative for fever.       10 Systems reviewed and are  negative for acute change except as noted in the HPI.  HENT: Negative for congestion.   Eyes: Negative for discharge and redness.  Respiratory: Negative for cough and shortness of breath.   Cardiovascular: Negative for chest pain.       Tachycardia  Gastrointestinal: Negative for vomiting and abdominal pain.  Musculoskeletal: Negative for back pain.  Skin: Negative for rash.  Neurological: Positive for light-headedness. Negative for syncope, numbness and headaches.  Psychiatric/Behavioral:       No behavior change.    Allergies  Black pepper  Home Medications   Current Outpatient Rx  Name Route Sig Dispense Refill  . ALBUTEROL SULFATE HFA 108 (90 BASE) MCG/ACT IN AERS Inhalation Inhale 2 puffs into the lungs every 6 (six) hours as needed. For shortness of breath    . VAPOR INHALER IN Inhalation Inhale 1-2 puffs into the lungs as needed. For congestion    . ASPIRIN EC 325 MG PO TBEC Oral Take 325 mg by mouth daily.    Marland Kitchen DIAZEPAM 5 MG PO TABS Oral Take 5 mg by mouth 2 (two) times daily.    . ONE-A-DAY MENS 50+ ADVANTAGE PO Oral Take 1 tablet by mouth at bedtime.    Marland Kitchen TIOTROPIUM BROMIDE MONOHYDRATE 18 MCG IN CAPS Inhalation Place 18 mcg into inhaler and inhale daily.      BP  98/53  Pulse 103  Temp 97.9 F (36.6 C)  Resp 18  Ht 5' (1.524 m)  Wt 126 lb (57.153 kg)  BMI 24.61 kg/m2  SpO2 94%  Physical Exam  Nursing note and vitals reviewed. Constitutional: He is oriented to person, place, and time.       Awake, alert, nontoxic appearance.Quarrelsome.  HENT:  Head: Atraumatic.  Eyes: Right eye exhibits no discharge. Left eye exhibits no discharge.  Neck: Neck supple.  Pulmonary/Chest: Effort normal. He exhibits no tenderness.  Abdominal: Soft. There is no tenderness. There is no rebound.  Musculoskeletal: He exhibits no tenderness.       Baseline ROM, no obvious new focal weakness.  Neurological: He is alert and oriented to person, place, and time.       Mental status and  motor strength appears baseline for patient and situation.  Skin: No rash noted.    ED Course  Procedures (including critical care time)    1. Palpitations   2. Dizziness       MDM  Patient with several recent visits to two different ERs with similar c/o tachycardia and dizziness. Given his h/o, behavior is suggestive of his schizoaffective disorder for which he is not medication. I strongly suggested he follow up with Dr. Juanetta Gosling, his PCP later today. He had a snack, took PO fluids. He had cream rubbed on the areas on his back he felt were itching. He flirted with the nurse's and nurse aides.Pt stable in ED with no significant deterioration in condition.The patient appears reasonably screened and/or stabilized for discharge and I doubt any other medical condition or other [facility] requiring further screening, evaluation, or treatment in the ED at this time prior to discharge.  MDM Reviewed: previous chart, nursing note and vitals Reviewed previous: ECG           Nicoletta Dress. Colon Branch, MD 01/17/12 1006

## 2012-01-17 NOTE — ED Notes (Signed)
Consent signed for blood transfusion. Pt connected to cardiac monitor showing NSR. Pt alert and oriented x 3. Skin warm and dry. Color pink. Pt has pale nailbeds. Pt agrees at this time to stay in hospital and receive transfusion.

## 2012-01-17 NOTE — ED Notes (Signed)
Sob for 2-3 hours,  No cough,

## 2012-01-17 NOTE — ED Notes (Signed)
Attempted to call report to [place].  Pt to be admitted to room 302.  RN unavailable.  Left message.

## 2012-01-17 NOTE — ED Notes (Signed)
Dr. Manus Gunning notified of lab value of Hct 18 and Hgb of 5.7,

## 2012-01-17 NOTE — ED Notes (Signed)
Pt reporting being "weak and dizzy". States he started feeling that way yesterday.  Pt denies nausea, vomiting or abdominal pain.  Pt to room, assisted with gown. Pt immediately demanding something to eat.  Explained to pt that we are unable to provide food or drink until after he has seen the physician.

## 2012-01-17 NOTE — ED Provider Notes (Addendum)
History     CSN: [id number]  Arrival date & time 01/17/12  1601   First MD Initiated Contact with Patient 01/17/12 1614      Chief Complaint  Patient presents with  . Shortness of Breath    (Consider location/radiation/quality/duration/timing/severity/associated sxs/prior treatment) HPI Comments: Patient complains of "can't catch my breath" for the past 3 hours. He says that he is not getting enough air but is in no distress and speaking in full sentences. He denies any chest pain, fever, cough, chills. Denies any lightheadedness or dizziness. He denies any nausea or vomiting. Patient does have a history of COPD, alcohol abuse. Patient has had frequent ED visits in the recent past for similar vague complaints. He is a history of leaving against medical advice and not waiting for results.  The history is provided by the patient.    Past Medical History  Diagnosis Date  . COPD (chronic obstructive pulmonary disease)   . Anxiety   . Alcohol abuse   . Anemia   . Generalized headaches   . Hypertension   . AV malformation of GI tract   . Pulmonary nodule/lesion, solitary   . Schizoaffective disorder     Past Surgical History  Procedure Date  . No past surgeries     History reviewed. No pertinent family history.  History  Substance Use Topics  . Smoking status: Former Smoker    Quit date: 05/07/1969  . Smokeless tobacco: Current User    Types: Snuff  . Alcohol Use: 2.4 oz/week    4 Cans of beer per week     ETOH      Review of Systems  Constitutional: Negative for fever, activity change and appetite change.  HENT: Negative for congestion and rhinorrhea.   Respiratory: Positive for chest tightness and shortness of breath. Negative for cough.   Cardiovascular: Negative for chest pain.  Gastrointestinal: Negative for nausea, vomiting and abdominal pain.  Genitourinary: Negative for dysuria and hematuria.  Musculoskeletal: Negative for back pain.  Neurological:  Negative for weakness and headaches.    Allergies  Black pepper  Home Medications   Current Outpatient Rx  Name Route Sig Dispense Refill  . ALBUTEROL SULFATE HFA 108 (90 BASE) MCG/ACT IN AERS Inhalation Inhale 2 puffs into the lungs every 6 (six) hours as needed. For shortness of breath    . VAPOR INHALER IN Inhalation Inhale 1-2 puffs into the lungs as needed. For congestion    . ASPIRIN EC 325 MG PO TBEC Oral Take 325 mg by mouth daily.    . ONE-A-DAY MENS 50+ ADVANTAGE PO Oral Take 1 tablet by mouth at bedtime.    Marland Kitchen DIAZEPAM 5 MG PO TABS Oral Take 5 mg by mouth 2 (two) times daily.    Marland Kitchen TIOTROPIUM BROMIDE MONOHYDRATE 18 MCG IN CAPS Inhalation Place 18 mcg into inhaler and inhale daily.      BP 95/50  Pulse 97  Temp(Src) 98.1 F (36.7 C) (Oral)  Resp 20  Ht 5' (1.524 m)  Wt 126 lb (57.153 kg)  BMI 24.61 kg/m2  SpO2 92%  Physical Exam  Constitutional: He is oriented to person, place, and time. He appears well-developed and well-nourished. No distress.  HENT:  Head: Normocephalic and atraumatic.  Mouth/Throat: Oropharynx is clear and moist. No oropharyngeal exudate.  Eyes: Conjunctivae and EOM are normal. Pupils are equal, round, and reactive to light.  Neck: Normal range of motion. Neck supple.  Cardiovascular: Normal rate, regular rhythm and normal heart sounds.  Pulmonary/Chest: Effort normal and breath sounds normal. No respiratory distress.  Abdominal: Soft. There is no tenderness. There is no rebound and no guarding.  Genitourinary: Guaiac positive stool.       Small external hemorrhoid, black tarry stools  Musculoskeletal: Normal range of motion. He exhibits no edema and no tenderness.       Baseline range of motion, no focal weakness  Neurological: He is alert and oriented to person, place, and time. No cranial nerve deficit.       Oriented x3  Skin: Skin is warm.    ED Course  Procedures (including critical care time)  Labs Reviewed  CBC - Abnormal;  Notable for the following:    WBC 3.8 (*)    RBC 1.98 (*) RESULT REPEATED AND VERIFIED   Hemoglobin 5.7 (*)    HCT 18.0 (*) RESULT REPEATED AND VERIFIED   RDW 17.2 (*)    All other components within normal limits  DIFFERENTIAL - Abnormal; Notable for the following:    Monocytes Relative 18 (*)    All other components within normal limits  BASIC METABOLIC PANEL - Abnormal; Notable for the following:    GFR calc non Af Amer 76 (*)    GFR calc Af Amer 88 (*)    All other components within normal limits  TROPONIN I  D-DIMER, QUANTITATIVE  PREPARE RBC (CROSSMATCH)  TYPE AND SCREEN  OCCULT BLOOD, POC DEVICE  PROTIME-INR   Dg Chest 2 View  01/17/2012  *RADIOLOGY REPORT*  Clinical Data: Shortness of breath, chest pain  CHEST - 2 VIEW  Comparison: 12/19/2011  Findings: Normal heart size and vascularity.  Chronic emphysematous changes and scarring in the upper lobes.  Remote granulomatous changes in the left hilum.  No superimposed pneumonia, collapse, consolidation, edema, effusion or pneumothorax.  Trachea midline. Stable exam.  IMPRESSION: Stable chronic scarring and emphysema changes.  No superimposed acute process.  Original Report Authenticated By: [name]. [name], M.D.     1. GI bleed   2. Anemia       MDM  Short of breath for the past 2 hours. Patient no distress, mild tachycardia, no hypoxia. Lungs clear Several recent visits with leaving AGAINST MEDICAL ADVICE and not waiting for results.  Profound anemia on CBC. Patient has black stools consistent with probable upper GI bleed. I explained the results to the patient that he needs a blood transfusion and needs to come in the hospital. He is a history of leaving AGAINST MEDICAL ADVICE and not wanting test done. He is alert oriented and able to make his own decisions. I explained to him with his low hemoglobin he could go home and have a heart attack or stroke or bleed to death. He is willing to stay so I will discuss with Dr.  Juanetta Gosling.  Blood transfusion ordered. Patient agreeable. Admission discussed with Dr. Felecia Shelling.  Consult to gastroenterology placed as well. Protonix gtt continued. D/w Dr. Jena Gauss.  Agrees with PPI.  Will see patient in morning.   Date: 01/17/2012  Rate: 95  Rhythm: normal sinus rhythm  QRS Axis: normal  Intervals: normal  ST/T Wave abnormalities: normal  Conduction Disutrbances:none  Narrative Interpretation:   Old EKG Reviewed: unchanged  CRITICAL CARE Performed by: Glynn Octave   Total critical care time: 40  Critical care time was exclusive of separately billable procedures and treating other patients.  Critical care was necessary to treat or prevent imminent or life-threatening deterioration.  Critical care was time spent personally by  me on the following activities: development of treatment plan with patient and/or surrogate as well as nursing, discussions with consultants, evaluation of patient's response to treatment, examination of patient, obtaining history from patient or surrogate, ordering and performing treatments and interventions, ordering and review of laboratory studies, ordering and review of radiographic studies, pulse oximetry and re-evaluation of patient's condition.      Glynn Octave, MD 01/17/12 1820  Glynn Octave, MD 01/17/12 1610

## 2012-01-17 NOTE — Discharge Instructions (Signed)
Call Dr. Juanetta Gosling office in the morning. You have been in the emergency room twice a day for several days with palpitations and dizziness. We have not found anything dangerous. Let Dr. Juanetta Gosling no Rosezella [place] visits.

## 2012-01-17 NOTE — ED Notes (Signed)
Pt denies pain at present time.  Blood infusing, rate increased to 125 ml/hr.  Pt tolerating transfusion without difficulty.

## 2012-01-17 NOTE — ED Notes (Signed)
Pt provided with crackers and sprite.  Cream applied to dry skin on back.  Pt tolerated.

## 2012-01-18 ENCOUNTER — Encounter (HOSPITAL_COMMUNITY)
Admission: EM | Discharge status: Against Medical Advice | Payer: Self-pay | Source: Home / Self Care | Attending: Pulmonary Disease

## 2012-01-18 ENCOUNTER — Encounter (HOSPITAL_COMMUNITY): Payer: Self-pay | Admitting: Gastroenterology

## 2012-01-18 DIAGNOSIS — K921 Melena: Secondary | ICD-10-CM

## 2012-01-18 DIAGNOSIS — K259 Gastric ulcer, unspecified as acute or chronic, without hemorrhage or perforation: Secondary | ICD-10-CM

## 2012-01-18 DIAGNOSIS — D649 Anemia, unspecified: Secondary | ICD-10-CM

## 2012-01-18 DIAGNOSIS — K210 Gastro-esophageal reflux disease with esophagitis: Secondary | ICD-10-CM

## 2012-01-18 HISTORY — PX: ESOPHAGOGASTRODUODENOSCOPY: SHX5428

## 2012-01-18 LAB — CBC
HCT: 27.2 % — ABNORMAL LOW (ref 39.0–52.0)
HCT: 29.1 % — ABNORMAL LOW (ref 39.0–52.0)
Hemoglobin: 9.1 g/dL — ABNORMAL LOW (ref 13.0–17.0)
MCH: 28.8 pg (ref 26.0–34.0)
MCH: 29 pg (ref 26.0–34.0)
MCHC: 33.5 g/dL (ref 30.0–36.0)
MCV: 87.9 fL (ref 78.0–100.0)
Platelets: 378 10*3/uL (ref 150–400)
RBC: 3.16 MIL/uL — ABNORMAL LOW (ref 4.22–5.81)
RDW: 17.3 % — ABNORMAL HIGH (ref 11.5–15.5)

## 2012-01-18 SURGERY — EGD (ESOPHAGOGASTRODUODENOSCOPY)
Anesthesia: Moderate Sedation

## 2012-01-18 MED ORDER — ALBUTEROL SULFATE HFA 108 (90 BASE) MCG/ACT IN AERS
2.0000 | INHALATION_SPRAY | Freq: Four times a day (QID) | RESPIRATORY_TRACT | Status: DC | PRN
  Administered 2012-01-18: 2 via RESPIRATORY_TRACT
  Filled 2012-01-18 (×2): qty 6.7

## 2012-01-18 MED ORDER — PROMETHAZINE HCL 25 MG/ML IJ SOLN: 12.5000 mg | INTRAMUSCULAR | Status: DC

## 2012-01-18 MED ORDER — PEG 3350-KCL-NABCB-NACL-NASULF 236 G PO SOLR
4000.0000 mL | Freq: Once | ORAL | Status: AC
  Administered 2012-01-18: 4000 mL via ORAL
  Filled 2012-01-18: qty 4000

## 2012-01-18 MED ORDER — ALBUTEROL SULFATE HFA 108 (90 BASE) MCG/ACT IN AERS: 2.0000 | INHALATION_SPRAY | Freq: Four times a day (QID) | RESPIRATORY_TRACT | Status: DC | PRN

## 2012-01-18 MED ORDER — STERILE WATER FOR IRRIGATION IR SOLN
Status: DC | PRN
  Administered 2012-01-18: 11:00:00

## 2012-01-18 MED ORDER — MEPERIDINE HCL 100 MG/ML IJ SOLN
INTRAMUSCULAR | Status: AC
  Filled 2012-01-18: qty 2

## 2012-01-18 MED ORDER — MIDAZOLAM HCL 5 MG/5ML IJ SOLN
INTRAMUSCULAR | Status: AC
  Filled 2012-01-18: qty 10

## 2012-01-18 MED ORDER — MIDAZOLAM HCL 5 MG/5ML IJ SOLN
INTRAMUSCULAR | Status: DC | PRN
  Administered 2012-01-18 (×2): 1 mg via INTRAVENOUS

## 2012-01-18 MED ORDER — BUTAMBEN-TETRACAINE-BENZOCAINE 2-2-14 % EX AERO
INHALATION_SPRAY | CUTANEOUS | Status: DC | PRN
  Administered 2012-01-18: 2 via TOPICAL

## 2012-01-18 MED ORDER — SODIUM CHLORIDE 0.45 % IV SOLN
Freq: Once | INTRAVENOUS | Status: AC
  Administered 2012-01-18: 10:00:00 via INTRAVENOUS

## 2012-01-18 MED ORDER — MEPERIDINE HCL 100 MG/ML IJ SOLN
INTRAMUSCULAR | Status: DC | PRN
  Administered 2012-01-18: 25 mg via INTRAVENOUS

## 2012-01-18 NOTE — Progress Notes (Signed)
   CARE MANAGEMENT NOTE 01/18/2012  Patient:  [name],[name]   Account Number:  [id number]  Date Initiated:  01/18/2012  Documentation initiated by:  Sharrie Rothman  Subjective/Objective Assessment:   Pt admitted from home with SOB and anemia. Pt son is in and out of the home. Pt estranged wife lives across the road and helps cook meals for the pt. Pt does not have any DME in the home at this time. Pt signs out AMA frequently.     Action/Plan:   CM spoke with pt and he refuses any home health at this time even though he would benefit from it. GI consult.Pt MD has made an [facility] referral as an outpt. [facility]   Anticipated DC Date:  01/25/2012   Anticipated DC Plan:  HOME/SELF CARE  In-house referral  Clinical Social Worker      DC Planning Services  CM consult      Choice offered to / List presented to:             Status of service:  In process, will continue to follow Medicare Important Message given?   (If response is "NO", the following Medicare IM given date fields will be blank) Date Medicare IM given:   Date Additional Medicare IM given:    Discharge Disposition:  HOME/SELF CARE  Per UR Regulation:    If discussed at Long Length of Stay Meetings, dates discussed:    Comments:  01/18/12 1412 Arlyss Queen, RN BSN CM Pt admitted from home. Pt has frequent readmissions and signs out AMA. CSW aware of MD referral to [facility]. Will continue to follow.

## 2012-01-18 NOTE — Progress Notes (Signed)
Report given to this CSW that an Adult Protective Services report has been made prior to this admission. I have left a message for the APS worker and await follow up. Will advise- Reece Levy, MSW, Theresia Majors [phone]

## 2012-01-18 NOTE — OR Nursing (Signed)
Order received to discontinue order for Phenergan  prior to procedure.

## 2012-01-18 NOTE — Op Note (Signed)
[facility] [address] [place], [place]  [address]  ENDOSCOPY PROCEDURE REPORT  PATIENT:  [name], [name]  MR#:  [id number] BIRTHDATE:  [birth year], 81 yrs. old  GENDER:  male  ENDOSCOPIST:  R. Roetta Sessions, MD Caleen Essex Referred by:  Kari Baars, M.D.  PROCEDURE DATE:  01/18/2012 PROCEDURE:  EGD-diagnostic  INDICATIONS:  melena-significant drop in hemoglobin.  INFORMED CONSENT:   The risks, benefits, limitations, alternatives and imponderables have been discussed.  The potential for biopsy, esophogeal dilation, etc. have also been reviewed.  Questions have been answered.  All parties agreeable.  Please see the history and physical in the medical record for more information.  MEDICATIONS:  Versed 2 mg IV and Demerol 25 mg IV. Cetacaine spray.  DESCRIPTION OF PROCEDURE:   The EG-[id number] ([id number]) endoscope was introduced through the mouth and advanced to the second portion of the duodenum without difficulty or limitations.  The mucosal surfaces were surveyed very carefully during advancement of the scope and upon withdrawal.  Retroflexion view of the proximal stomach and esophagogastric junction was performed.  <<PROCEDUREIMAGES>>  FINDINGS:  Multiple distal esophageal erosions with the longes extending 2 cm up from the Z line. No esophageal varices and no Barrett's esophagus. Noncritical Schatzki's ring.  Stomach empty. Small hiatal hernia. Multiple antral erosions and one small 6 mm "dumbbell" shaped ulcer with a clean base in the prepyloric antral mucosa. No infiltrative process seen. Pylorus patent. Examination of the duodenal bulb and second portion revealed no abnormalities. No blood in the upper GI tract observed.  THERAPEUTIC / DIAGNOSTIC MANEUVERS PERFORMED:  none  COMPLICATIONS:   None  IMPRESSION:  Erosive reflux esophagitis. Noncritical Schatzki's ring. Antral erosions and small antral ulcer - appeared innocent as far as                   recent bleeding is concerned.  RECOMMENDATIONS:   Clear liquid diet. Check H. pylori serologies. Proceed with diagnostic colonoscopy tomorrow.  ______________________________ R. Roetta Sessions, MD Caleen Essex  CC:  n. eSIGNED:   R. Roetta Sessions at 01/18/2012 11:37 AM  Hester Mates, [phone]

## 2012-01-18 NOTE — Progress Notes (Signed)
Attempted to call ER to get report.  No answer x 2 calls.   Jon Billings

## 2012-01-18 NOTE — Consult Note (Signed)
Referring Provider: Dr. Manus Gunning (ED) Primary Care Physician:  Fredirick Maudlin, MD, MD Primary Gastroenterologist:  Dr. Darrick Penna   Date of Admission: 01/17/12 Date of Consultation: 01/18/12  Reason for Consultation:  Significant anemia, melena   HPI:  [name] is a 78 year old male with a hx of gastric and colonic AVMs requiring intervention with BICAP in Sept 2009 with Dr. Darrick Penna. Please see further details under PSH. He has a hx of persistent microcytic anemia.  He was admitted 3/19 with generalized weakness and dizziness. Hgb was 5.7, noted melena, heme + stools. He has been transfused with 3 u PRBCs. Repeat Hgb this morning 9.1.  Reports melena for several weeks prior to admission. Thinks something or a piece of something has "damaged him" on the "inside". Denies any abdominal pain, GERD, dysphagia. Has BM daily. Denies any bright red blood per rectum. Denies NSAIDs and aspirin powders. Difficult to obtain exact intake of ETOH,but apparently he has a hx of ETOH abuse in the past. Drinks beer currently. States "if you drink every day it won't hurt you if you eat".    Past Medical History  Diagnosis Date  . COPD (chronic obstructive pulmonary disease)   . Anxiety   . Alcohol abuse   . Anemia   . Generalized headaches   . Hypertension   . AV malformation of GI tract   . Pulmonary nodule/lesion, solitary   . Schizoaffective disorder     Past Surgical History  Procedure Date  . No past surgeries   . Colonoscopy Sept 2009    SLF: few small AVMs in ascending colon distal to IC . Ablated via BICAP.   Marland Kitchen Esophagogastroduodenoscopy Sept 2009    SLF: normal esophagus, small HH, 1-2 AVMs actively oozing in mid stomach, s/p BICAP, path benign     Prior to Admission medications   Medication Sig Start Date End Date Taking? Authorizing Provider  albuterol (PROAIR HFA) 108 (90 BASE) MCG/ACT inhaler Inhale 2 puffs into the lungs every 6 (six) hours as needed. For shortness of breath   Yes  Historical Provider, MD  Aromatic Inhalants (VAPOR INHALER IN) Inhale 1-2 puffs into the lungs as needed. For congestion   Yes Historical Provider, MD  aspirin EC 325 MG tablet Take 325 mg by mouth daily.   Yes Historical Provider, MD  Multiple Vitamins-Minerals (ONE-A-DAY MENS 50+ ADVANTAGE PO) Take 1 tablet by mouth at bedtime.   Yes Historical Provider, MD  diazepam (VALIUM) 5 MG tablet Take 5 mg by mouth 2 (two) times daily.    Historical Provider, MD  tiotropium (SPIRIVA) 18 MCG inhalation capsule Place 18 mcg into inhaler and inhale daily.    Historical Provider, MD    Current Facility-Administered Medications  Medication Dose Route Frequency Provider Last Rate Last Dose  . 0.9 %  sodium chloride infusion   Intravenous Once Glynn Octave, MD      . albuterol (PROVENTIL) (5 MG/ML) 0.5% nebulizer solution 2.5 mg  2.5 mg Nebulization Once Avon Gully, MD   2.5 mg at 01/17/12 1935  . albuterol (PROVENTIL) (5 MG/ML) 0.5% nebulizer solution 2.5 mg  2.5 mg Nebulization Q4H PRN Fredirick Maudlin, MD   2.5 mg at 01/18/12 0031   And  . ipratropium (ATROVENT) nebulizer solution 0.5 mg  0.5 mg Nebulization Q4H PRN Fredirick Maudlin, MD   0.5 mg at 01/18/12 0031  . diazepam (VALIUM) tablet 5 mg  5 mg Oral BID Fredirick Maudlin, MD   5 mg at 01/18/12 0835  .  ipratropium (ATROVENT) nebulizer solution 0.5 mg  0.5 mg Nebulization Once Avon Gully, MD   0.5 mg at 01/17/12 1935  . mulitivitamin with minerals tablet 1 tablet  1 tablet Oral Daily Fredirick Maudlin, MD      . pantoprazole (PROTONIX) injection 40 mg  40 mg Intravenous Once Glynn Octave, MD   40 mg at 01/17/12 1754  . pantoprazole (PROTONIX) injection 40 mg  40 mg Intravenous Q24H Fredirick Maudlin, MD      . pneumococcal 23 valent vaccine (PNU-IMMUNE) injection 0.5 mL  0.5 mL Intramuscular Tomorrow-1000 Fredirick Maudlin, MD      . promethazine [facility]) injection 12.5 mg  12.5 mg Intravenous On Call Nira Retort, NP      . sodium chloride  0.9 % bolus 1,000 mL  1,000 mL Intravenous Once Glynn Octave, MD   1,000 mL at 01/17/12 1819  . tiotropium (SPIRIVA) inhalation capsule 18 mcg  18 mcg Inhalation Daily Fredirick Maudlin, MD      . DISCONTD: pantoprazole (PROTONIX) 80 mg in sodium chloride 0.9 % 250 mL infusion  8 mg/hr Intravenous Continuous Glynn Octave, MD        Allergies as of 01/17/2012 - Review Complete 01/17/2012  Allergen Reaction Noted  . Black pepper (piper nigrum) Itching 12/22/2011    Family History  Problem Relation Age of Onset  . Colon cancer Neg Hx     History   Social History  . Marital Status: Married    Spouse Name: N/A    Number of Children: N/A  . Years of Education: N/A   Occupational History  . Not on file.   Social History Main Topics  . Smoking status: Former Smoker    Quit date: 05/07/1969  . Smokeless tobacco: Current User    Types: Snuff  . Alcohol Use: 2.4 oz/week    4 Cans of beer per week     ETOH  . Drug Use: No  . Sexually Active: Not on file   Other Topics Concern  . Not on file   Social History Narrative  . No narrative on file    Review of Systems: Gen: Denies fever, chills, loss of appetite, change in weight or weight loss CV: Denies chest pain, heart palpitations, syncope, edema  Resp: +SOB, hx of COPD GI: Denies dysphagia or odynophagia. Denies vomiting blood, jaundice, and fecal incontinence.  GU : Denies urinary burning, urinary frequency, urinary incontinence.  MS: + back pain Derm: Denies rash, itching, dry skin Psych: Denies depression, anxiety,confusion, or memory loss Heme: Denies bruising, bleeding, and enlarged lymph nodes.  Physical Exam: Vital signs in last 24 hours: Temp:  [98 F (36.7 C)-98.7 F (37.1 C)] 98.1 F (36.7 C) (03/20 0413) Pulse Rate:  [75-107] 76  (03/20 0828) Resp:  [16-22] 17  (03/20 0828) BP: (68-123)/(43-79) 104/62 mmHg (03/20 0413) SpO2:  [85 %-100 %] 94 % (03/20 0828) Weight:  [126 lb (57.153 kg)-132 lb 4.4 oz  (60 kg)] 132 lb 4.4 oz (60 kg) (03/19 2033) Last BM Date: 01/16/12 General:   Alert,  Well-developed, well-nourished, pleasant and cooperative in NAD Head:  Normocephalic and atraumatic. Eyes:  Sclera clear, no icterus.   Conjunctiva pink. Ears:  Normal auditory acuity. Nose:  No deformity, discharge,  or lesions. Mouth:  No deformity or lesions, no dentition.  Neck:  Supple; no masses or thyromegaly. Lungs:  Clear throughout to auscultation.   No wheezes, crackles, or rhonchi. No acute distress. Heart:  Regular rate  and rhythm; no murmurs, clicks, rubs,  or gallops. Abdomen:  Soft, nontender and nondistended. No masses, hepatosplenomegaly or hernias noted. Normal bowel sounds, without guarding, and without rebound.   Rectal:  Deferred until time of colonoscopy.   Msk:  Symmetrical without gross deformities. Normal posture. Pulses:  Normal pulses noted. Extremities:  Without clubbing or edema. Neurologic:  Alert and  oriented to person and place, unsure of year.  Skin:  Intact without significant lesions or rashes. Cervical Nodes:  No significant cervical adenopathy. Psych:  Alert and cooperative. Normal mood and affect.  Intake/Output from previous day: 03/19 0701 - 03/20 0700 In: 1428.7 [P.O.:240; Blood:1188.7] Out: 300 [Urine:300] Intake/Output this shift:    Lab Results:  Basename 01/18/12 0621 01/17/12 1643  WBC 4.1 3.8*  HGB 9.1* 5.7*  HCT 27.2* 18.0*  PLT 339 396   BMET  Basename 01/17/12 1643  NA 135  K 3.8  CL 101  CO2 26  GLUCOSE 99  BUN 14  CREATININE 0.98  CALCIUM 9.0    Studies/Results: Dg Chest 2 View  01/17/2012  *RADIOLOGY REPORT*  Clinical Data: Shortness of breath, chest pain  CHEST - 2 VIEW  Comparison: 12/19/2011  Findings: Normal heart size and vascularity.  Chronic emphysematous changes and scarring in the upper lobes.  Remote granulomatous changes in the left hilum.  No superimposed pneumonia, collapse, consolidation, edema, effusion or  pneumothorax.  Trachea midline. Stable exam.  IMPRESSION: Stable chronic scarring and emphysema changes.  No superimposed acute process.  Original Report Authenticated By: [name]. [name], M.D.    Impression: 78 year old male with hx of gastric and colonic AVMs s/p BICAP intervention in 2009, hx of anemia, now presenting with significant acute on chronic anemia of 5.7, melena, heme + stools, and weakness. He has received 3 units of PRBCs, with nice response of Hgb to 9.1. Denies any other symptoms such as abdominal pain, N/V, bright red blood per rectum. He does have a hx of ETOH abuse in past; it is difficult to obtain current ETOH use. In light of his hx, concern for bleeding AVMs as well as alcoholic gastritis, PUD. We will proceed with an upper endoscopy today and possibly a colonoscopy on 3/21 if necessary, due to his hx of colonic AVMs.   As of note, pt was well aware of who he was, where he was, yet he was unsure of the year. He was aware of the current situation, yet he was unable to fully explain the procedure we would be doing. I attempted to call his home #, which was disconnected. I called a cell phone # listed, but his son answered and seemed confused himself. I also attempted to look up his wife's information, (she does not live in same house), but I was unable to reach her. As this is an urgent/emergent case, we will proceed with an upper endoscopy today with consent as appropriate with patient.   He had a very small sip of coffee this morning around 8am. He has been NPO since. Phenergan 12.5 mg on call for procedure due to hx of ETOH abuse.   Plan: EGD today with RMR. I have discussed the risks, benefits, and alternatives with patient.  Continue PPI, convert to oral after procedure Consider colonoscopy on 3/21 if necessary Follow Hgb Further recommendations after procedure is completed.    LOS: 1 day   Gerrit Halls  01/18/2012, 10:07 AM

## 2012-01-18 NOTE — Progress Notes (Signed)
Subjective: He is overall about the same. He says he doesn't get his nerve medication he's going to check out AMA again. I have sent in a report to Adult Protective Services about him earlier this month. I'm not certain that he's really able to make decisions on his own behalf, at least not all the time  Objective: Vital signs in last 24 hours: Temp:  [98 F (36.7 C)-98.7 F (37.1 C)] 98.1 F (36.7 C) (03/20 0413) Pulse Rate:  [75-107] 76  (03/20 0828) Resp:  [16-22] 17  (03/20 0828) BP: (68-123)/(43-79) 104/62 mmHg (03/20 0413) SpO2:  [85 %-100 %] 94 % (03/20 0828) Weight:  [57.153 kg (126 lb)-60 kg (132 lb 4.4 oz)] 60 kg (132 lb 4.4 oz) (03/19 2033) Weight change:  Last BM Date: 01/16/12  Intake/Output from previous day: 03/19 0701 - 03/20 0700 In: 1428.7 [P.O.:240; Blood:1188.7] Out: 300 [Urine:300]  PHYSICAL EXAM General appearance: alert and no distress Resp: clear to auscultation bilaterally Cardio: regular rate and rhythm, S1, S2 normal, no murmur, click, rub or gallop GI: soft, non-tender; bowel sounds normal; no masses,  no organomegaly Extremities: extremities normal, atraumatic, no cyanosis or edema  Lab Results:    Basic Metabolic Panel:  Basename 01/17/12 1643  NA 135  K 3.8  CL 101  CO2 26  GLUCOSE 99  BUN 14  CREATININE 0.98  CALCIUM 9.0  MG --  PHOS --   Liver Function Tests: No results found for this basename: AST:2,ALT:2,ALKPHOS:2,BILITOT:2,PROT:2,ALBUMIN:2 in the last 72 hours No results found for this basename: LIPASE:2,AMYLASE:2 in the last 72 hours No results found for this basename: AMMONIA:2 in the last 72 hours CBC:  Basename 01/18/12 0621 01/17/12 1643  WBC 4.1 3.8*  NEUTROABS -- 2.2  HGB 9.1* 5.7*  HCT 27.2* 18.0*  MCV 86.1 90.9  PLT 339 396   Cardiac Enzymes:  Basename 01/17/12 1643  CKTOTAL --  CKMB --  CKMBINDEX --  TROPONINI <0.30   BNP: No results found for this basename: PROBNP:3 in the last 72  hours D-Dimer:  [facility] 01/17/12 1643  DDIMER 0.22   CBG: No results found for this basename: GLUCAP:6 in the last 72 hours Hemoglobin A1C: No results found for this basename: HGBA1C in the last 72 hours Fasting Lipid Panel: No results found for this basename: CHOL,HDL,LDLCALC,TRIG,CHOLHDL,LDLDIRECT in the last 72 hours Thyroid Function Tests: No results found for this basename: TSH,T4TOTAL,FREET4,T3FREE,THYROIDAB in the last 72 hours Anemia Panel: No results found for this basename: VITAMINB12,FOLATE,FERRITIN,TIBC,IRON,RETICCTPCT in the last 72 hours Coagulation: No results found for this basename: LABPROT:2,INR:2 in the last 72 hours Urine Drug Screen: Drugs of Abuse     Component Value Date/Time   LABOPIA NONE DETECTED 11/08/2007 2330   COCAINSCRNUR NONE DETECTED 11/08/2007 2330   LABBENZ POSITIVE* 11/08/2007 2330   AMPHETMU NONE DETECTED 11/08/2007 2330   THCU NONE DETECTED 11/08/2007 2330   LABBARB  Value: NONE DETECTED        DRUG SCREEN FOR MEDICAL PURPOSES ONLY.  IF CONFIRMATION IS NEEDED FOR ANY PURPOSE, NOTIFY LAB WITHIN 5 DAYS.        LOWEST DETECTABLE LIMITS FOR URINE DRUG SCREEN Drug Class       Cutoff (ng/mL) Amphetamine      1000 Barbiturate      200 Benzodiazepine   200 Cocaine          300 Opiates          300 THC  50 11/08/2007 2330    Alcohol Level: No results found for this basename: ETH:2 in the last 72 hours Urinalysis: No results found for this basename: COLORURINE:2,APPERANCEUR:2,LABSPEC:2,PHURINE:2,GLUCOSEU:2,HGBUR:2,BILIRUBINUR:2,KETONESUR:2,PROTEINUR:2,UROBILINOGEN:2,NITRITE:2,LEUKOCYTESUR:2 in the last 72 hours Misc. Labs:  ABGS No results found for this basename: PHART,PCO2,PO2ART,TCO2,HCO3 in the last 72 hours CULTURES No results found for this or any previous visit (from the past 240 hour(s)). Studies/Results: Dg Chest 2 View  01/17/2012  *RADIOLOGY REPORT*  Clinical Data: Shortness of breath, chest pain  CHEST - 2 VIEW  Comparison: 12/19/2011   Findings: Normal heart size and vascularity.  Chronic emphysematous changes and scarring in the upper lobes.  Remote granulomatous changes in the left hilum.  No superimposed pneumonia, collapse, consolidation, edema, effusion or pneumothorax.  Trachea midline. Stable exam.  IMPRESSION: Stable chronic scarring and emphysema changes.  No superimposed acute process.  Original Report Authenticated By: [name]. [name], M.D.    Medications:  Scheduled:   . sodium chloride   Intravenous Once  . albuterol  2.5 mg Nebulization Once  . diazepam  5 mg Oral BID  . ipratropium  0.5 mg Nebulization Once  . mulitivitamin with minerals  1 tablet Oral Daily  . pantoprazole (PROTONIX) IV  40 mg Intravenous Once  . pantoprazole (PROTONIX) IV  40 mg Intravenous Q24H  . pneumococcal 23 valent vaccine  0.5 mL Intramuscular Tomorrow-1000  . sodium chloride  1,000 mL Intravenous Once  . tiotropium  18 mcg Inhalation Daily   Continuous:   . DISCONTD: pantoprozole (PROTONIX) infusion     ZOX:[id number], ipratropium  Assesment: He has GI bleeding again. His hemoglobin level has improved. GI consultation is to be done today. He has problems with behavior and perhaps not being able to make appropriate decisions on his own. Active Problems:  * No active hospital problems. *     Plan: No change in treatments he's for GI consultation and potentially for EGD.    LOS: 1 day   Tamanna Whitson L 01/18/2012, 8:43 AM

## 2012-01-19 ENCOUNTER — Encounter (HOSPITAL_COMMUNITY)
Admission: EM | Discharge status: Against Medical Advice | Payer: Self-pay | Source: Home / Self Care | Attending: Pulmonary Disease

## 2012-01-19 DIAGNOSIS — K922 Gastrointestinal hemorrhage, unspecified: Secondary | ICD-10-CM

## 2012-01-19 DIAGNOSIS — D649 Anemia, unspecified: Secondary | ICD-10-CM

## 2012-01-19 LAB — CBC
HCT: 28.1 % — ABNORMAL LOW (ref 39.0–52.0)
Hemoglobin: 9.2 g/dL — ABNORMAL LOW (ref 13.0–17.0)
MCHC: 32.7 g/dL (ref 30.0–36.0)
WBC: 4.4 10*3/uL (ref 4.0–10.5)

## 2012-01-19 SURGERY — COLONOSCOPY
Anesthesia: Moderate Sedation

## 2012-01-19 MED ORDER — MAGNESIUM CITRATE PO SOLN
1.0000 | Freq: Once | ORAL | Status: AC
  Administered 2012-01-19: 1 via ORAL
  Filled 2012-01-19: qty 296

## 2012-01-19 MED ORDER — BISACODYL 5 MG PO TBEC
10.0000 mg | DELAYED_RELEASE_TABLET | Freq: Once | ORAL | Status: AC
  Administered 2012-01-19: 10 mg via ORAL
  Filled 2012-01-19: qty 2

## 2012-01-19 NOTE — Clinical Documentation Improvement (Signed)
Anemia Blood Loss Clarification  THIS DOCUMENT IS NOT A PERMANENT PART OF THE MEDICAL RECORD  RESPOND TO THE THIS QUERY, FOLLOW THE INSTRUCTIONS BELOW:  1. If needed, update documentation for the patient's encounter via the notes activity.  2. Access this query again and click edit on the In [employer].  3. After updating, or not, click F2 to complete all highlighted (required) fields concerning your review. Select "additional documentation in the medical record" OR "no additional documentation provided".  4. Click Sign note button.  5. The deficiency will fall out of your In Basket *Please let us know if you are not able to complete this workflow by phone or e-mail (listed below).        01/19/12  Dear Dr. Juanetta Gosling Marton Redwood  In an effort to better capture your patient's severity of illness, reflect appropriate length of stay and utilization of resources, a review of the patient medical record has revealed the following indicators.  Please clarify and document in a progress note and/or discharge summary the clinical condition associated with the following supporting information. Please exercise your independent judgment.  The fact that a query is asked, does not imply that any particular answer is desired or expected.  Possible Clinical Conditions?   " Expected Acute Blood Loss Anemia " Acute Blood Loss Anemia " Acute on chronic blood loss anemia " Chronic blood loss anemia " Precipitous drop in Hematocrit " Other Condition________________ " Cannot Clinically Determine  Clinical Information:  Risk Factors: GI bleed Acute on Chronic anemia due to GI Bleed Profound anemia noted on ED provider note  Signs and Symptoms  Melena Positive Stool Guaiac   Diagnostics: Component  Latest Ref Rng   01/17/2012   01/18/2012  01/18/2012  01/19/2012  WBC   4.0 - 10.5 K/uL   3.8 (L)    4.1   4.4  4.4  RBC   4.22 - 5.81 MIL/uL   1.98 (L)   3.16 (L)  3.31 (L)  3.21 (L)  HGB   13.0 - 17.0  g/dL   5.7 (LL)   9.1 (L)   9.6 (L)   9.2 (L)  HCT   39.0 - 52.0 %    18.0 (L)   27.2 (L)  29.1 (L)  28.1 (L)   Treatments: Transfusion: 3 units PRBC Q12H H&H monitoring  Reviewed: additional documentation in the medical record  Thank You,  Debora T Williams RN, MSN Clinical Documentation Specialist: Office# [phone] [facility] Health Information Management [place]

## 2012-01-19 NOTE — Progress Notes (Signed)
Patient left AMA, refusing to sign form. Patient ambulated out of facility with neighbor who happened to come to visit [name]. MD aware of patient refusing treatment and removing IV and telemetry monitor.

## 2012-01-19 NOTE — Progress Notes (Signed)
   CARE MANAGEMENT NOTE 01/19/2012  Patient:  [name],[name]   Account Number:  [id number]  Date Initiated:  01/18/2012  Documentation initiated by:  Sharrie Rothman  Subjective/Objective Assessment:   Pt admitted from home with SOB and anemia. Pt son is in and out of the home. Pt estranged wife lives across the road and helps cook meals for the pt. Pt does not have any DME in the home at this time. Pt signs out AMA frequently.     Action/Plan:   CM spoke with pt and he refuses any home health at this time even though he would benefit from it. GI consult.Pt MD has made an APS referral as an outpt. CS[facility]   Anticipated DC Date:  01/25/2012   Anticipated DC Plan:  HOME/SELF CARE  In-house referral  Clinical Social Worker      DC Planning Services  CM consult      Choice offered to / List presented to:             Status of service:  Completed, signed off Medicare Important Message given?   (If response is "NO", the following Medicare IM given date fields will be blank) Date Medicare IM given:   Date Additional Medicare IM given:    Discharge Disposition:  HOME/SELF CARE  Per UR Regulation:    If discussed at Long Length of Stay Meetings, dates discussed:    Comments:  01/19/12 1209 Arlyss Queen, RN BSN CM Pt signed out AMA. 01/18/12 1412 Arlyss Queen, RN BSN CM Pt admitted from home. Pt has frequent readmissions and signs out AMA. CSW aware of MD referral to APS. Will continue to follow.

## 2012-01-19 NOTE — Plan of Care (Signed)
Problem: Phase II Progression Outcomes Goal: Progress activity as tolerated unless otherwise ordered Outcome: Progressing Pt ambulates in room and hall, still progressing with his tolerance, as he was SOB after ambulating in the hallway.

## 2012-01-19 NOTE — Progress Notes (Signed)
Subjective: He continues to be somewhat belligerent. As noted in the chart there will has been a report to [facility]. He will go ahead with the colonoscopy I think.  Objective: Vital signs in last 24 hours: Temp:  [97.6 F (36.4 C)-98 F (36.7 C)] 97.9 F (36.6 C) (03/20 2049) Pulse Rate:  [62-77] 76  (03/20 2049) Resp:  [15-22] 16  (03/20 2049) BP: (110-158)/(66-101) 110/66 mmHg (03/20 2049) SpO2:  [93 %-100 %] 98 % (03/20 2146) Weight:  [59.875 kg (132 lb)] 59.875 kg (132 lb) (03/20 1015) Weight change: 2.722 kg (6 lb) Last BM Date: 01/16/12  Intake/Output from previous day: 03/20 0701 - 03/21 0700 In: 480 [P.O.:480] Out: -   PHYSICAL EXAM General appearance: alert and uncooperative Resp: clear to auscultation bilaterally Cardio: regular rate and rhythm, S1, S2 normal, no murmur, click, rub or gallop GI: soft, non-tender; bowel sounds normal; no masses,  no organomegaly Extremities: extremities normal, atraumatic, no cyanosis or edema  Lab Results:    Basic Metabolic Panel:  Basename 01/17/12 1643  NA 135  K 3.8  CL 101  CO2 26  GLUCOSE 99  BUN 14  CREATININE 0.98  CALCIUM 9.0  MG --  PHOS --   Liver Function Tests: No results found for this basename: AST:2,ALT:2,ALKPHOS:2,BILITOT:2,PROT:2,ALBUMIN:2 in the last 72 hours No results found for this basename: LIPASE:2,AMYLASE:2 in the last 72 hours No results found for this basename: AMMONIA:2 in the last 72 hours CBC:  Basename 01/19/12 0439 01/18/12 1754 01/17/12 1643  WBC 4.4 4.4 --  NEUTROABS -- -- 2.2  HGB 9.2* 9.6* --  HCT 28.1* 29.1* --  MCV 87.5 87.9 --  PLT 368 378 --   Cardiac Enzymes:  Basename 01/17/12 1643  CKTOTAL --  CKMB --  CKMBINDEX --  TROPONINI <0.30   BNP: No results found for this basename: PROBNP:3 in the last 72 hours D-Dimer:  [facility] 01/17/12 1643  DDIMER 0.22   CBG: No results found for this basename: GLUCAP:6 in the last 72 hours Hemoglobin  A1C: No results found for this basename: HGBA1C in the last 72 hours Fasting Lipid Panel: No results found for this basename: CHOL,HDL,LDLCALC,TRIG,CHOLHDL,LDLDIRECT in the last 72 hours Thyroid Function Tests: No results found for this basename: TSH,T4TOTAL,FREET4,T3FREE,THYROIDAB in the last 72 hours Anemia Panel: No results found for this basename: VITAMINB12,FOLATE,FERRITIN,TIBC,IRON,RETICCTPCT in the last 72 hours Coagulation:  Basename 01/18/12 1214  LABPROT 13.7  INR 1.03   Urine Drug Screen: Drugs of Abuse     Component Value Date/Time   LABOPIA NONE DETECTED 11/08/2007 2330   COCAINSCRNUR NONE DETECTED 11/08/2007 2330   LABBENZ POSITIVE* 11/08/2007 2330   AMPHETMU NONE DETECTED 11/08/2007 2330   THCU NONE DETECTED 11/08/2007 2330   LABBARB  Value: NONE DETECTED        DRUG SCREEN FOR MEDICAL PURPOSES ONLY.  IF CONFIRMATION IS NEEDED FOR ANY PURPOSE, NOTIFY LAB WITHIN 5 DAYS.        LOWEST DETECTABLE LIMITS FOR URINE DRUG SCREEN Drug Class       Cutoff (ng/mL) Amphetamine      1000 Barbiturate      200 Benzodiazepine   200 Cocaine          300 Opiates          300 THC              50 11/08/2007 2330    Alcohol Level: No results found for this basename: ETH:2 in the last 72 hours Urinalysis:  No results found for this basename: COLORURINE:2,APPERANCEUR:2,LABSPEC:2,PHURINE:2,GLUCOSEU:2,HGBUR:2,BILIRUBINUR:2,KETONESUR:2,PROTEINUR:2,UROBILINOGEN:2,NITRITE:2,LEUKOCYTESUR:2 in the last 72 hours Misc. Labs:  ABGS No results found for this basename: PHART,PCO2,PO2ART,TCO2,HCO3 in the last 72 hours CULTURES No results found for this or any previous visit (from the past 240 hour(s)). Studies/Results: Dg Chest 2 View  01/17/2012  *RADIOLOGY REPORT*  Clinical Data: Shortness of breath, chest pain  CHEST - 2 VIEW  Comparison: 12/19/2011  Findings: Normal heart size and vascularity.  Chronic emphysematous changes and scarring in the upper lobes.  Remote granulomatous changes in the left hilum.  No  superimposed pneumonia, collapse, consolidation, edema, effusion or pneumothorax.  Trachea midline. Stable exam.  IMPRESSION: Stable chronic scarring and emphysema changes.  No superimposed acute process.  Original Report Authenticated By: [name]. [name], M.D.    Medications:  Prior to Admission:  Prescriptions prior to admission  Medication Sig Dispense Refill  . albuterol (PROAIR HFA) 108 (90 BASE) MCG/ACT inhaler Inhale 2 puffs into the lungs every 6 (six) hours as needed. For shortness of breath      . Aromatic Inhalants (VAPOR INHALER IN) Inhale 1-2 puffs into the lungs as needed. For congestion      . aspirin EC 325 MG tablet Take 325 mg by mouth daily.      . Multiple Vitamins-Minerals (ONE-A-DAY MENS 50+ ADVANTAGE PO) Take 1 tablet by mouth at bedtime.      . diazepam (VALIUM) 5 MG tablet Take 5 mg by mouth 2 (two) times daily.      Marland Kitchen tiotropium (SPIRIVA) 18 MCG inhalation capsule Place 18 mcg into inhaler and inhale daily.       Scheduled:   . sodium chloride   Intravenous Once  . sodium chloride   Intravenous Once  . diazepam  5 mg Oral BID  . meperidine      . midazolam      . mulitivitamin with minerals  1 tablet Oral Daily  . pantoprazole (PROTONIX) IV  40 mg Intravenous Q24H  . pneumococcal 23 valent vaccine  0.5 mL Intramuscular Tomorrow-1000  . polyethylene glycol  4,000 mL Oral Once  . promethazine  12.5 mg Intravenous On Call  . tiotropium  18 mcg Inhalation Daily  . DISCONTD: promethazine  12.5 mg Intravenous On Call   Continuous:  ZOX:[id number], DISCONTD: albuterol, DISCONTD: albuterol, DISCONTD: butamben-tetracaine-benzocaine, DISCONTD: ipratropium, DISCONTD: meperidine, DISCONTD: midazolam, DISCONTD: simethicone susp in sterile water 1000 mL irrigation  Assesment: He has had GI bleeding. His EGD did not show a definite cause of bleeding. He is set for colonoscopy he will agree to it. His behavior has been a problem. Active Problems:  * No active hospital  problems. *     Plan: I will discuss with our social worker he may need an acting consult but I'm not certain at this point that he has anything that would allow [place] to have involuntary commitment.    LOS: 2 days   Adjoa Althouse L 01/19/2012, 8:07 AM

## 2012-01-19 NOTE — Progress Notes (Signed)
Clinical Social Work Department BRIEF PSYCHOSOCIAL ASSESSMENT 01/19/2012  Patient:  [name], [name]     Account Number:  [id number]     Admit date:  01/17/2012  Clinical Social Worker:  Robin Searing  Date/Time:  01/19/2012 11:43 AM  Referred by:  Physician  Date Referred:  01/18/2012 Referred for  Other - See comment   Other Referral:   Referrd to CSW due to recent APS referral. Per report, MD had contacted APS due to concerns related to patient at home. Per APS worker, Maximiano Coss, the patient's community MD wrote a letter indicating patient has capacity except for when he is drinking. APS has completed this referral and does not have an open case. Patient has been told by APS and MD per APS report that if he continues to drink he will die. Patient d/c ed today AMA prior to CSW seeing. APS updated-   Interview type:  Other - See comment Other interview type:   APS, Hospital staff    PSYCHOSOCIAL DATA Living Status:  ALONE Admitted from facility:   Level of care:   Primary support name:  unknown/extended family per APS Primary support relationship to patient:   Degree of support available:   unknown/extended family per APS    CURRENT CONCERNS Current Concerns  Substance Abuse   Other Concerns:    SOCIAL WORK ASSESSMENT / PLAN Updated APS who is very familiar with patient.   Assessment/plan status:   Other assessment/ plan:   Information/referral to community resources:   APS update but no plans to reopen case at this time.    PATIENT'S/FAMILY'S RESPONSE TO PLAN OF CARE:

## 2012-01-19 NOTE — Progress Notes (Signed)
Subjective:  Patient has only taken 1/4 of golytely prep. Refusing further golytely because "IT IS MAKING ME GO TO THE BATHROOM". Belligerent, yelling at me and nursing staff. Threatening to leave if someone does not feed him by 10am. Refusing enema as well. Told me he would drink one cup of magnesium citrate and have colonoscopy but we better hurry up. Denies further melena, abd pain, brbpr.   Objective: Vital signs in last 24 hours: Temp:  [97.6 F (36.4 C)-98 F (36.7 C)] 97.9 F (36.6 C) (03/20 2049) Pulse Rate:  [62-77] 76  (03/20 2049) Resp:  [15-22] 16  (03/20 2049) BP: (110-158)/(66-101) 110/66 mmHg (03/20 2049) SpO2:  [93 %-100 %] 98 % (03/20 2146) Weight:  [132 lb (59.875 kg)] 132 lb (59.875 kg) (03/20 1015) Last BM Date: 01/16/12 General:   Alert,  Uncooperative in NAD. Very upset, yelling. Head:  Normocephalic and atraumatic. Eyes:  Sclera clear, no icterus.   Abdomen:  Soft, nontender and nondistended.  Extremities:  Without clubbing, deformity or edema. Neurologic:  Alert and  oriented x4;  grossly normal neurologically. Skin:  Intact without significant lesions or rashes. Psych:  Alert and uncooperative.angry. Intake/Output from previous day: 03/20 0701 - 03/21 0700 In: 480 [P.O.:480] Out: -  Intake/Output this shift:    Lab Results: CBC  Basename 01/19/12 0439 01/18/12 1754 01/18/12 0621  WBC 4.4 4.4 4.1  HGB 9.2* 9.6* 9.1*  HCT 28.1* 29.1* 27.2*  MCV 87.5 87.9 86.1  PLT 368 378 339     Assessment/Plan: Profound anemia with acute on chronic GI bleed. Erosive reflux esophagitis and noncritical Schatzki's ring. Antral erosions and small innocent appearing antra ulcer. H.Pylori serologies pending. Patient has not fully prepped for colonoscopy. Reports several BMs, "getting clear" but not witnessed by nursing staff.  Patient belligerent and refusing golytely. Wants to eat. Threatening to leave AMA. Long discussion with him but unable to reason. He agrees to try  MagCitrate. Will not take enema. Discussed with nursing staff, Gloriajean Dell.    LOS: 2 days   Tana Coast  01/19/2012, 8:20 AM

## 2012-01-19 NOTE — Progress Notes (Addendum)
Upon entering room to explain enema to [name], [name] became verbally abusive and refused the enema. I told him that he could discuss this with Dr. Juanetta Gosling when he made rounds today, he agreed to this. Also, note that Dr. Juanetta Gosling is aware that [name] is non-compliant with Go-Lytely instructions and is refusing most of it. Dr. Jena Gauss paged to inform him of this. No answer at this time.  Sheryn Bison

## 2012-01-20 ENCOUNTER — Emergency Department (HOSPITAL_COMMUNITY)
Admission: EM | Admit: 2012-01-20 | Discharge: 2012-01-20 | Disposition: A | Discharge status: Home / Self Care | Payer: PRIVATE HEALTH INSURANCE | Attending: Emergency Medicine | Admitting: Emergency Medicine

## 2012-01-20 ENCOUNTER — Encounter (HOSPITAL_COMMUNITY): Payer: Self-pay

## 2012-01-20 ENCOUNTER — Encounter (HOSPITAL_COMMUNITY): Payer: Self-pay | Admitting: Internal Medicine

## 2012-01-20 ENCOUNTER — Emergency Department (HOSPITAL_COMMUNITY)
Admission: EM | Admit: 2012-01-20 | Discharge: 2012-01-20 | Discharge status: Against Medical Advice | Payer: PRIVATE HEALTH INSURANCE | Attending: Emergency Medicine | Admitting: Emergency Medicine

## 2012-01-20 DIAGNOSIS — Z91199 Patient's noncompliance with other medical treatment and regimen due to unspecified reason: Secondary | ICD-10-CM | POA: Insufficient documentation

## 2012-01-20 DIAGNOSIS — R0602 Shortness of breath: Secondary | ICD-10-CM | POA: Insufficient documentation

## 2012-01-20 DIAGNOSIS — Z9119 Patient's noncompliance with other medical treatment and regimen: Secondary | ICD-10-CM | POA: Insufficient documentation

## 2012-01-20 DIAGNOSIS — F411 Generalized anxiety disorder: Secondary | ICD-10-CM | POA: Insufficient documentation

## 2012-01-20 DIAGNOSIS — Z8659 Personal history of other mental and behavioral disorders: Secondary | ICD-10-CM | POA: Insufficient documentation

## 2012-01-20 DIAGNOSIS — Z760 Encounter for issue of repeat prescription: Secondary | ICD-10-CM | POA: Insufficient documentation

## 2012-01-20 DIAGNOSIS — J4489 Other specified chronic obstructive pulmonary disease: Secondary | ICD-10-CM | POA: Insufficient documentation

## 2012-01-20 DIAGNOSIS — I10 Essential (primary) hypertension: Secondary | ICD-10-CM | POA: Insufficient documentation

## 2012-01-20 DIAGNOSIS — J449 Chronic obstructive pulmonary disease, unspecified: Secondary | ICD-10-CM | POA: Insufficient documentation

## 2012-01-20 LAB — TYPE AND SCREEN
ABO/RH(D): O POS
Antibody Screen: NEGATIVE
Unit division: 0
Unit division: 0

## 2012-01-20 MED ORDER — IPRATROPIUM BROMIDE 0.02 % IN SOLN
0.5000 mg | Freq: Once | RESPIRATORY_TRACT | Status: AC
  Administered 2012-01-20: 0.5 mg via RESPIRATORY_TRACT
  Filled 2012-01-20: qty 2.5

## 2012-01-20 MED ORDER — AEROCHAMBER Z-STAT PLUS/MEDIUM MISC: 1.0000 | Freq: Once | Status: DC

## 2012-01-20 MED ORDER — ALBUTEROL SULFATE (5 MG/ML) 0.5% IN NEBU
5.0000 mg | INHALATION_SOLUTION | Freq: Once | RESPIRATORY_TRACT | Status: AC
  Administered 2012-01-20: 5 mg via RESPIRATORY_TRACT
  Filled 2012-01-20: qty 1

## 2012-01-20 MED ORDER — ALBUTEROL SULFATE HFA 108 (90 BASE) MCG/ACT IN AERS
2.0000 | INHALATION_SPRAY | RESPIRATORY_TRACT | Status: DC | PRN
  Administered 2012-01-20: 2 via RESPIRATORY_TRACT
  Filled 2012-01-20: qty 6.7

## 2012-01-20 NOTE — Progress Notes (Signed)
Handed pt his mdi, he was ready to go,

## 2012-01-20 NOTE — ED Provider Notes (Signed)
History     CSN: [id number]  Arrival date & time 01/20/12  6962   First MD Initiated Contact with Patient 01/20/12 1946      Chief Complaint  Patient presents with  . Medication Refill    (Consider location/radiation/quality/duration/timing/severity/associated sxs/prior treatment) HPI Comments: [name] is a 78 y.o. male who states he is here for medication refill. He specifically requests an albuterol inhaler. He has had numerous visits to the emergency department last 2 months with similar c/o. Per nurses who have watch him using his inhaler: He doses as many as 10 puffs with a single inhalation. Nurses also contacted his pharmacy, who stated that he went there to get an inhaler todayl; and they refused to give it to him today because he has had too many filled recently. They are apparently concerned that he is using it more than 4 times a day. The patient denies fever, chills, cough, chest pain, nausea, vomiting, weakness, or dizziness  The history is provided by the patient.    Past Medical History  Diagnosis Date  . COPD (chronic obstructive pulmonary disease)   . Anxiety   . Alcohol abuse   . Anemia   . Generalized headaches   . Hypertension   . AV malformation of GI tract   . Pulmonary nodule/lesion, solitary   . Schizoaffective disorder     Past Surgical History  Procedure Date  . No past surgeries   . Colonoscopy Sept 2009    SLF: few small AVMs in ascending colon distal to IC . Ablated via BICAP.   Marland Kitchen Esophagogastroduodenoscopy Sept 2009    SLF: normal esophagus, small HH, 1-2 AVMs actively oozing in mid stomach, s/p BICAP, path benign   . Esophagogastroduodenoscopy 01/18/2012    Procedure: ESOPHAGOGASTRODUODENOSCOPY (EGD);  Surgeon: Corbin Ade, MD;  Location: [facility];  Service: Endoscopy;  Laterality: N/A;  NEEDS PHENERGAN 12.5 mg on call for procedure    Family History  Problem Relation Age of Onset  . Colon cancer Neg Hx     History    Substance Use Topics  . Smoking status: Former Smoker    Quit date: 05/07/1969  . Smokeless tobacco: Current User    Types: Snuff  . Alcohol Use: 2.4 oz/week    4 Cans of beer per week     ETOH      Review of Systems  All other systems reviewed and are negative.    Allergies  Black pepper  Home Medications   Current Outpatient Rx  Name Route Sig Dispense Refill  . ALBUTEROL SULFATE HFA 108 (90 BASE) MCG/ACT IN AERS Inhalation Inhale 2 puffs into the lungs every 6 (six) hours as needed. For shortness of breath    . VAPOR INHALER IN Inhalation Inhale 1-2 puffs into the lungs as needed. For congestion    . DIAZEPAM 5 MG PO TABS Oral Take 5 mg by mouth 2 (two) times daily.    . ONE-A-DAY MENS 50+ ADVANTAGE PO Oral Take 1 tablet by mouth at bedtime.    Marland Kitchen TIOTROPIUM BROMIDE MONOHYDRATE 18 MCG IN CAPS Inhalation Place 18 mcg into inhaler and inhale daily.      BP 148/86  Pulse 93  Temp(Src) 97.8 F (36.6 C) (Oral)  Resp 20  Ht 5\' 4"  (1.626 m)  Wt 140 lb (63.504 kg)  BMI 24.03 kg/m2  SpO2 95%  Physical Exam  Nursing note and vitals reviewed. Constitutional: He is oriented to person, place, and time. He appears  well-developed and well-nourished.  HENT:  Head: Normocephalic and atraumatic.  Right Ear: External ear normal.  Left Ear: External ear normal.  Eyes: Conjunctivae and EOM are normal. Pupils are equal, round, and reactive to light.  Neck: Normal range of motion and phonation normal. Neck supple.  Cardiovascular: Normal rate, regular rhythm, normal heart sounds and intact distal pulses.   Pulmonary/Chest: Effort normal. He exhibits no bony tenderness.       Decreased air movement and end expiratory wheezing  Abdominal: Soft. Normal appearance. There is no tenderness.  Musculoskeletal: Normal range of motion.  Neurological: He is alert and oriented to person, place, and time. He has normal strength. No cranial nerve deficit or sensory deficit. He exhibits normal  muscle tone. Coordination normal.  Skin: Skin is warm, dry and intact.  Psychiatric: He has a normal mood and affect. His behavior is normal. Judgment and thought content normal.    ED Course  Procedures (including critical care time)  Emergency department treatment: Albuterol and Atrovent nebulizer.  10:19 PM Reevaluation with update and discussion. After initial assessment and treatment, an updated evaluation reveals  increased air movement on exam. He states he is nowhere inhaler at home, and his pharmacy is closed tonight. An inhaler with spacer is given. Donold Marotto L    I contacted his PCP. He service, to inform them of his numerous visits to the ER   Labs Reviewed - No data to display No results found.   1. Asthma       MDM Recurrent asthma with medication noncompliance. Patient wheezing in the ER with reassuring vital signs. He is stable for discharge   Plan: Home Medications- alb. inhaler; Home Treatments- usual; Recommended follow up- PCP in 4 days   Flint Melter, MD 01/20/12 2225

## 2012-01-20 NOTE — ED Notes (Signed)
Patient states that he is here for a medication refill. He needs a pro air inhaler only per pt.

## 2012-01-20 NOTE — ED Notes (Signed)
Pt states he left his nerve pills up stairs the other day and he needs his albuterol inhaler refilled. Pharmacy called and has pt's meds. Pt aware

## 2012-01-20 NOTE — ED Notes (Signed)
Feels sob,

## 2012-01-20 NOTE — Discharge Instructions (Signed)
Use your inhaler, with a spacer, 2 puffs every 4 hours as needed; for cough or trouble breathing. Call your doctor on Monday for an appointment to be seen for a followup visit.  Asthma, Adult Asthma is a disease of the lungs and can make it hard to breathe. Asthma cannot be cured, but medicine can help control it. Asthma may be started (triggered) by:  Pollen.   Dust.   Animal skin flakes (dander).   Molds.   Foods.   Respiratory infections (colds, flu).   Smoke.   Exercise.   Stress.   Other things that cause allergic reactions or allergies (allergens).  HOME CARE   Talk to your doctor about how to manage your attacks at home. This may include:   Using a tool called a peak flow meter.   Having medicine ready to stop the attack.   Take all medicine as told by your doctor.   Wash bed sheets and blankets every week in hot water and put them in the dryer.   Drink enough fluids to keep your pee (urine) clear or pale yellow.   Always be ready to get emergency help. Write down the phone number for your doctor. Keep it where you can easily find it.   Talk about exercise routines with your doctor.   If animal dander is causing your asthma, you may need to find a new home for your pet(s).  GET HELP RIGHT AWAY IF:   You have muscle aches.   You cough more.   You have chest pain.   You have thick spit (sputum) that changes to yellow, green, gray, or bloody.   Medicine does not stop your wheezing.   You have problems breathing.   You have a fever.   Your medicine causes:   A rash.   Itching.   Puffiness (swelling).   Breathing problems.  MAKE SURE YOU:   Understand these instructions.   Will watch your condition.   Will get help right away if you are not doing well or get worse.  Document Released: 04/04/2008 Document Revised: 10/06/2011 Document Reviewed: 08/27/2008 [facility] Patient Information 2012 [place], [place].

## 2012-01-20 NOTE — ED Notes (Signed)
Nurse called [facility] for pt to see if he had a refill on an albuterol inhaler. Pt had pick up pro air inhaler on 01/04/12 and it was to last 25 days. Pt states that had been long gone. Pt then stated he was out of here and left the dept

## 2012-01-23 ENCOUNTER — Emergency Department (HOSPITAL_COMMUNITY)
Admission: EM | Admit: 2012-01-23 | Discharge: 2012-01-23 | Disposition: A | Discharge status: Home / Self Care | Payer: PRIVATE HEALTH INSURANCE | Attending: Emergency Medicine | Admitting: Emergency Medicine

## 2012-01-23 ENCOUNTER — Emergency Department (HOSPITAL_COMMUNITY): Payer: PRIVATE HEALTH INSURANCE

## 2012-01-23 ENCOUNTER — Encounter (HOSPITAL_COMMUNITY): Payer: Self-pay | Admitting: *Deleted

## 2012-01-23 DIAGNOSIS — J449 Chronic obstructive pulmonary disease, unspecified: Secondary | ICD-10-CM | POA: Insufficient documentation

## 2012-01-23 DIAGNOSIS — Z87891 Personal history of nicotine dependence: Secondary | ICD-10-CM | POA: Insufficient documentation

## 2012-01-23 DIAGNOSIS — I10 Essential (primary) hypertension: Secondary | ICD-10-CM | POA: Insufficient documentation

## 2012-01-23 DIAGNOSIS — F411 Generalized anxiety disorder: Secondary | ICD-10-CM | POA: Insufficient documentation

## 2012-01-23 DIAGNOSIS — M25569 Pain in unspecified knee: Secondary | ICD-10-CM

## 2012-01-23 DIAGNOSIS — J4489 Other specified chronic obstructive pulmonary disease: Secondary | ICD-10-CM | POA: Insufficient documentation

## 2012-01-23 DIAGNOSIS — F259 Schizoaffective disorder, unspecified: Secondary | ICD-10-CM | POA: Insufficient documentation

## 2012-01-23 DIAGNOSIS — Z91018 Allergy to other foods: Secondary | ICD-10-CM | POA: Insufficient documentation

## 2012-01-23 DIAGNOSIS — M25469 Effusion, unspecified knee: Secondary | ICD-10-CM | POA: Insufficient documentation

## 2012-01-23 MED ORDER — TRAMADOL HCL 50 MG PO TABS: 50.0000 mg | ORAL_TABLET | Freq: Four times a day (QID) | ORAL | Status: AC | PRN

## 2012-01-23 MED ORDER — POLYETHYLENE GLYCOL 3350 17 GM/SCOOP PO POWD: 17.0000 g | Freq: Every day | ORAL | Status: AC

## 2012-01-23 NOTE — ED Notes (Signed)
Pt presents to er with c/o sob that started getting worse last night, pt states that he has not been able to sleep during the night due to being sob, pt also c/o right knee pain, denies any injury.

## 2012-01-23 NOTE — Discharge Instructions (Signed)
Knee Pain The knee is the complex joint between your thigh and your lower leg. It is made up of bones, tendons, ligaments, and cartilage. The bones that make up the knee are:  The femur in the thigh.   The tibia and fibula in the lower leg.   The patella or kneecap riding in the groove on the lower femur.  CAUSES  Knee pain is a common complaint with many causes. A few of these causes are:  Injury, such as:   A ruptured ligament or tendon injury.   Torn cartilage.   Medical conditions, such as:   Gout   Arthritis   Infections   Overuse, over training or overdoing a physical activity.  Knee pain can be minor or severe. Knee pain can accompany debilitating injury. Minor knee problems often respond well to self-care measures or get well on their own. More serious injuries may need medical intervention or even surgery. SYMPTOMS The knee is complex. Symptoms of knee problems can vary widely. Some of the problems are:  Pain with movement and weight bearing.   Swelling and tenderness.   Buckling of the knee.   Inability to straighten or extend your knee.   Your knee locks and you cannot straighten it.   Warmth and redness with pain and fever.   Deformity or dislocation of the kneecap.  DIAGNOSIS  Determining what is wrong may be very straight forward such as when there is an injury. It can also be challenging because of the complexity of the knee. Tests to make a diagnosis may include:  Your caregiver taking a history and doing a physical exam.   Routine X-rays can be used to rule out other problems. X-rays will not reveal a cartilage tear. Some injuries of the knee can be diagnosed by:   Arthroscopy a surgical technique by which a small video camera is inserted through tiny incisions on the sides of the knee. This procedure is used to examine and repair internal knee joint problems. Tiny instruments can be used during arthroscopy to repair the torn knee cartilage  (meniscus).   Arthrography is a radiology technique. A contrast liquid is directly injected into the knee joint. Internal structures of the knee joint then become visible on X-ray film.   An MRI scan is a non x-ray radiology procedure in which magnetic fields and a computer produce two- or three-dimensional images of the inside of the knee. Cartilage tears are often visible using an MRI scanner. MRI scans have largely replaced arthrography in diagnosing cartilage tears of the knee.   Blood work.   Examination of the fluid that helps to lubricate the knee joint (synovial fluid). This is done by taking a sample out using a needle and a syringe.  TREATMENT The treatment of knee problems depends on the cause. Some of these treatments are:  Depending on the injury, proper casting, splinting, surgery or physical therapy care will be needed.   Give yourself adequate recovery time. Do not overuse your joints. If you begin to get sore during workout routines, back off. Slow down or do fewer repetitions.   For repetitive activities such as cycling or running, maintain your strength and nutrition.   Alternate muscle groups. For example if you are a weight lifter, work the upper body on one day and the lower body the next.   Either tight or weak muscles do not give the proper support for your knee. Tight or weak muscles do not absorb the stress placed   on the knee joint. Keep the muscles surrounding the knee strong.   Take care of mechanical problems.   If you have flat feet, orthotics or special shoes may help. See your caregiver if you need help.   Arch supports, sometimes with wedges on the inner or outer aspect of the heel, can help. These can shift pressure away from the side of the knee most bothered by osteoarthritis.   A brace called an "unloader" brace also may be used to help ease the pressure on the most arthritic side of the knee.   If your caregiver has prescribed crutches, braces,  wraps or ice, use as directed. The acronym for this is PRICE. This means protection, rest, ice, compression and elevation.   Nonsteroidal anti-inflammatory drugs (NSAID's), can help relieve pain. But if taken immediately after an injury, they may actually increase swelling. Take NSAID's with food in your stomach. Stop them if you develop stomach problems. Do not take these if you have a history of ulcers, stomach pain or bleeding from the bowel. Do not take without your caregiver's approval if you have problems with fluid retention, heart failure, or kidney problems.   For ongoing knee problems, physical therapy may be helpful.   Glucosamine and chondroitin are over-the-counter dietary supplements. Both may help relieve the pain of osteoarthritis in the knee. These medicines are different from the usual anti-inflammatory drugs. Glucosamine may decrease the rate of cartilage destruction.   Injections of a corticosteroid drug into your knee joint may help reduce the symptoms of an arthritis flare-up. They may provide pain relief that lasts a few months. You may have to wait a few months between injections. The injections do have a small increased risk of infection, water retention and elevated blood sugar levels.   Hyaluronic acid injected into damaged joints may ease pain and provide lubrication. These injections may work by reducing inflammation. A series of shots may give relief for as long as 6 months.   Topical painkillers. Applying certain ointments to your skin may help relieve the pain and stiffness of osteoarthritis. Ask your pharmacist for suggestions. Many over the-counter products are approved for temporary relief of arthritis pain.   In some countries, doctors often prescribe topical NSAID's for relief of chronic conditions such as arthritis and tendinitis. A review of treatment with NSAID creams found that they worked as well as oral medications but without the serious side effects.    PREVENTION  Maintain a healthy weight. Extra pounds put more strain on your joints.   Get strong, stay limber. Weak muscles are a common cause of knee injuries. Stretching is important. Include flexibility exercises in your workouts.   Be smart about exercise. If you have osteoarthritis, chronic knee pain or recurring injuries, you may need to change the way you exercise. This does not mean you have to stop being active. If your knees ache after jogging or playing basketball, consider switching to swimming, water aerobics or other low-impact activities, at least for a few days a week. Sometimes limiting high-impact activities will provide relief.   Make sure your shoes fit well. Choose footwear that is right for your sport.   Protect your knees. Use the proper gear for knee-sensitive activities. Use kneepads when playing volleyball or laying carpet. Buckle your seat belt every time you drive. Most shattered kneecaps occur in car accidents.   Rest when you are tired.  SEEK MEDICAL CARE IF:  You have knee pain that is continual and does not   seem to be getting better.  SEEK IMMEDIATE MEDICAL CARE IF:  Your knee joint feels hot to the touch and you have a high fever. MAKE SURE YOU:   Understand these instructions.   Will watch your condition.   Will get help right away if you are not doing well or get worse.  Document Released: 08/14/2007 Document Revised: 10/06/2011 Document Reviewed: 08/14/2007 [employer] Patient Information 2012 [employer]. 

## 2012-01-23 NOTE — ED Provider Notes (Signed)
History     CSN: [id number]  Arrival date & time 01/23/12  1247   First MD Initiated Contact with Patient 01/23/12 1308      Chief Complaint  Patient presents with  . Shortness of Breath  . Knee Pain    (Consider location/radiation/quality/duration/timing/severity/associated sxs/prior treatment) HPI Pt frequents this ED often. Has been here 10 times this month alone. Arrives with multiple vague complaints. When asked the primary reason for his visit today, states he was crossing his leg this morning and had a sharp pain on the medial surface of his R knee. No decreased ROM, swelling. No recent trauma. When asked about his SOB documented in RN notes pt stated that it has not changed and is chronic.  Past Medical History  Diagnosis Date  . COPD (chronic obstructive pulmonary disease)   . Anxiety   . Alcohol abuse   . Anemia   . Generalized headaches   . Hypertension   . AV malformation of GI tract   . Pulmonary nodule/lesion, solitary   . Schizoaffective disorder     Past Surgical History  Procedure Date  . No past surgeries   . Colonoscopy Sept 2009    SLF: few small AVMs in ascending colon distal to IC . Ablated via BICAP.   Marland Kitchen Esophagogastroduodenoscopy Sept 2009    SLF: normal esophagus, small HH, 1-2 AVMs actively oozing in mid stomach, s/p BICAP, path benign   . Esophagogastroduodenoscopy 01/18/2012    Procedure: ESOPHAGOGASTRODUODENOSCOPY (EGD);  Surgeon: Corbin Ade, MD;  Location: [facility];  Service: Endoscopy;  Laterality: N/A;  NEEDS PHENERGAN 12.5 mg on call for procedure    Family History  Problem Relation Age of Onset  . Colon cancer Neg Hx     History  Substance Use Topics  . Smoking status: Former Smoker    Quit date: 05/07/1969  . Smokeless tobacco: Current User    Types: Snuff  . Alcohol Use: 2.4 oz/week    4 Cans of beer per week     ETOH      Review of Systems  Constitutional: Negative for fever and chills.  Respiratory: Negative  for cough and shortness of breath.   Cardiovascular: Negative for chest pain and leg swelling.  Gastrointestinal: Negative for abdominal pain.  Musculoskeletal: Positive for arthralgias. Negative for back pain, joint swelling and gait problem.  Skin: Negative for rash and wound.  Neurological: Positive for numbness. Negative for dizziness, weakness and headaches.    Allergies  Black pepper  Home Medications   Current Outpatient Rx  Name Route Sig Dispense Refill  . ALBUTEROL SULFATE HFA 108 (90 BASE) MCG/ACT IN AERS Inhalation Inhale 2 puffs into the lungs every 6 (six) hours as needed. For shortness of breath    . VAPOR INHALER IN Inhalation Inhale 1-2 puffs into the lungs as needed. For congestion    . DIAZEPAM 5 MG PO TABS Oral Take 5 mg by mouth 2 (two) times daily.    . ONE-A-DAY MENS 50+ ADVANTAGE PO Oral Take 1 tablet by mouth at bedtime.    Marland Kitchen POLYETHYLENE GLYCOL 3350 PO POWD Oral Take 17 g by mouth daily. 255 g 0  . TIOTROPIUM BROMIDE MONOHYDRATE 18 MCG IN CAPS Inhalation Place 18 mcg into inhaler and inhale daily.    . TRAMADOL HCL 50 MG PO TABS Oral Take 1 tablet (50 mg total) by mouth every 6 (six) hours as needed for pain. 15 tablet 0    BP 109/69  Pulse 101  Temp(Src) 97.5 F (36.4 C) (Oral)  Resp 18  Ht 5\' 4"  (1.626 m)  Wt 124 lb (56.246 kg)  BMI 21.28 kg/m2  SpO2 97%  Physical Exam  Nursing note and vitals reviewed. Constitutional: He is oriented to person, place, and time. He appears well-developed and well-nourished. No distress.  HENT:  Head: Normocephalic and atraumatic.  Mouth/Throat: Oropharynx is clear and moist.  Eyes: EOM are normal. Pupils are equal, round, and reactive to light.  Neck: Normal range of motion. Neck supple.  Cardiovascular: Normal rate and regular rhythm.   Pulmonary/Chest: Effort normal and breath sounds normal. No respiratory distress. He has no wheezes. He has no rales.  Abdominal: Soft. Bowel sounds are normal. There is no  tenderness. There is no rebound and no guarding.  Musculoskeletal: Normal range of motion. He exhibits tenderness (very mild ttp over the medial surface of the R knee. No trauma, swelling, lig laxity. ). He exhibits no edema.  Neurological: He is alert and oriented to person, place, and time.       5/5 motor, sensation intact  Skin: Skin is warm and dry. No rash noted. No erythema.  Psychiatric: He has a normal mood and affect. His behavior is normal.    ED Course  Procedures (including critical care time)  Labs Reviewed - No data to display Dg Knee 2 Views Right  01/23/2012  *RADIOLOGY REPORT*  Clinical Data: Knee pain.  RIGHT KNEE - 1-2 VIEW  Comparison: None  Findings: There are mild tricompartmental degenerative changes but no acute fracture or osteochondral abnormality.  A small joint effusion is noted.  Vascular calcifications are noted.  IMPRESSION:  1.  No acute bony findings or significant degenerative changes. 2.  Small joint effusion.  Original Report Authenticated By: [name], M.D.     1. Knee pain       MDM  Will make sure no discrete injury present. Likely OA. Plan to D/C home with antiinflammatory med         Loren Racer, MD 01/23/12 1454

## 2012-01-23 NOTE — ED Notes (Signed)
Pt also states that he feels like his heart is beating fast at times also.

## 2012-01-23 NOTE — ED Notes (Signed)
In to discharge pt. Pt upset because he wanted pain medication injected in his knee. Pt given rx for pain medication.

## 2012-01-23 NOTE — ED Notes (Signed)
[facility] coming to evaluate pt per dr Juanetta Gosling

## 2012-01-25 NOTE — Discharge Summary (Signed)
Physician Discharge Summary  Patient ID: [name] MRN: [id number] DOB/AGE: [birth year] 78 y.o. Primary Care Physician:[name], MD, MD Admit date: 01/17/2012 Discharge date: 01/25/2012    Discharge Diagnoses:  GI bleeding Acute blood loss anemia Reflux esophagitis Gastric antral erosions and small gastric antral ulcer COPD Active Problems:  * No active hospital problems. *    Medication List  As of 01/25/2012  7:29 AM   ASK your doctor about these medications         diazepam 5 MG tablet   Commonly known as: VALIUM   Take 5 mg by mouth 2 (two) times daily.      ONE-A-DAY MENS 50+ ADVANTAGE PO   Take 1 tablet by mouth at bedtime.      VAPOR INHALER IN   Inhale 1-2 puffs into the lungs as needed. For congestion            Discharged Condition: Improved    Consults: Gastroenterology  Significant Diagnostic Studies: Dg Chest 2 View  01/17/2012  *RADIOLOGY REPORT*  Clinical Data: Shortness of breath, chest pain  CHEST - 2 VIEW  Comparison: 12/19/2011  Findings: Normal heart size and vascularity.  Chronic emphysematous changes and scarring in the upper lobes.  Remote granulomatous changes in the left hilum.  No superimposed pneumonia, collapse, consolidation, edema, effusion or pneumothorax.  Trachea midline. Stable exam.  IMPRESSION: Stable chronic scarring and emphysema changes.  No superimposed acute process.  Original Report Authenticated By: [name]. [name], M.D.   Dg Knee 2 Views Right  01/23/2012  *RADIOLOGY REPORT*  Clinical Data: Knee pain.  RIGHT KNEE - 1-2 VIEW  Comparison: None  Findings: There are mild tricompartmental degenerative changes but no acute fracture or osteochondral abnormality.  A small joint effusion is noted.  Vascular calcifications are noted.  IMPRESSION:  1.  No acute bony findings or significant degenerative changes. 2.  Small joint effusion.  Original Report Authenticated By: [name], M.D.    Lab Results: Basic  Metabolic Panel: No results found for this basename: NA:2,K:2,CL:2,CO2:2,GLUCOSE:2,BUN:2,CREATININE:2,CALCIUM:2,MG:2,PHOS:2 in the last 72 hours Liver Function Tests: No results found for this basename: AST:2,ALT:2,ALKPHOS:2,BILITOT:2,PROT:2,ALBUMIN:2 in the last 72 hours   CBC: No results found for this basename: WBC:2,NEUTROABS:2,HGB:2,HCT:2,MCV:2,PLT:2 in the last 72 hours  No results found for this or any previous visit (from the past 240 hour(s)).   Hospital Course: He was admitted with acute GI bleeding. He has had multiple admissions for the same. He frequently comes into the hospital and stays for an hour or so and checked out AMA. This is sometimes in the emergency room on and on at least one other occasion from the inpatient hospital. This time he came in with bleeding was found to have a hemoglobin level that was low and was transfused. He agreed to GI consultation and underwent EGD. However when he started his preparation for colonoscopy he became angry and agitated eventually refused the prep and then left the hospital AGAINST MEDICAL ADVICE.  Discharge Exam: Blood pressure 110/66, pulse 76, temperature 97.9 F (36.6 C), temperature source Oral, resp. rate 16, height 5\' 2"  (1.575 m), weight 59.875 kg (132 lb), SpO2 97.00%. When I saw him on the morning of his leaving the hospital he was awake and alert oriented but angry  Disposition: Home AGAINST MEDICAL ADVICE. I will send a referral to Adult Protective Services.    Follow-up Information    Follow up with [name], MD .         Signed:  Levita Monical L Pager [phone]  01/25/2012, 7:29 AM

## 2012-01-27 ENCOUNTER — Emergency Department (HOSPITAL_COMMUNITY)
Admission: EM | Admit: 2012-01-27 | Discharge: 2012-01-27 | Disposition: A | Discharge status: Home / Self Care | Payer: PRIVATE HEALTH INSURANCE | Attending: Emergency Medicine | Admitting: Emergency Medicine

## 2012-01-27 ENCOUNTER — Encounter (HOSPITAL_COMMUNITY): Payer: Self-pay

## 2012-01-27 DIAGNOSIS — D649 Anemia, unspecified: Secondary | ICD-10-CM | POA: Insufficient documentation

## 2012-01-27 DIAGNOSIS — Z760 Encounter for issue of repeat prescription: Secondary | ICD-10-CM | POA: Insufficient documentation

## 2012-01-27 DIAGNOSIS — J4489 Other specified chronic obstructive pulmonary disease: Secondary | ICD-10-CM | POA: Insufficient documentation

## 2012-01-27 DIAGNOSIS — I10 Essential (primary) hypertension: Secondary | ICD-10-CM | POA: Insufficient documentation

## 2012-01-27 DIAGNOSIS — Z79899 Other long term (current) drug therapy: Secondary | ICD-10-CM | POA: Insufficient documentation

## 2012-01-27 DIAGNOSIS — J449 Chronic obstructive pulmonary disease, unspecified: Secondary | ICD-10-CM | POA: Insufficient documentation

## 2012-01-27 DIAGNOSIS — F259 Schizoaffective disorder, unspecified: Secondary | ICD-10-CM | POA: Insufficient documentation

## 2012-01-27 DIAGNOSIS — Z87891 Personal history of nicotine dependence: Secondary | ICD-10-CM | POA: Insufficient documentation

## 2012-01-27 MED ORDER — ALBUTEROL SULFATE HFA 108 (90 BASE) MCG/ACT IN AERS
2.0000 | INHALATION_SPRAY | Freq: Once | RESPIRATORY_TRACT | Status: AC
  Administered 2012-01-27: 2 via RESPIRATORY_TRACT
  Filled 2012-01-27: qty 6.7

## 2012-01-27 NOTE — ED Notes (Signed)
Pt here for refill on inhaler. Denies any distress. MD office closed today in observance of holiday.

## 2012-01-27 NOTE — ED Provider Notes (Signed)
History     CSN: [id number]  Arrival date & time 01/27/12  1323   First MD Initiated Contact with Patient 01/27/12 1441      Chief Complaint  Patient presents with  . Medication Refill    (Consider location/radiation/quality/duration/timing/severity/associated sxs/prior treatment) HPI Comments: Patient presents for request of hid refill albuterol inhaler.  He has run out of his inhaler which he uses most nights one or 2 puffs to going to bed.  He denies any wheezing, cough, chest pain or shortness of breath or present this time.  He does have refills of this medication by his primary doctor, Dr. Juanetta Gosling  He will be unable to get his next prescription filled however until April 3 do to financial reasons.    The history is provided by the patient.    Past Medical History  Diagnosis Date  . COPD (chronic obstructive pulmonary disease)   . Anxiety   . Alcohol abuse   . Anemia   . Generalized headaches   . Hypertension   . AV malformation of GI tract   . Pulmonary nodule/lesion, solitary   . Schizoaffective disorder     Past Surgical History  Procedure Date  . No past surgeries   . Colonoscopy Sept 2009    SLF: few small AVMs in ascending colon distal to IC . Ablated via BICAP.   Marland Kitchen Esophagogastroduodenoscopy Sept 2009    SLF: normal esophagus, small HH, 1-2 AVMs actively oozing in mid stomach, s/p BICAP, path benign   . Esophagogastroduodenoscopy 01/18/2012    Procedure: ESOPHAGOGASTRODUODENOSCOPY (EGD);  Surgeon: Corbin Ade, MD;  Location: [facility];  Service: Endoscopy;  Laterality: N/A;  NEEDS PHENERGAN 12.5 mg on call for procedure    Family History  Problem Relation Age of Onset  . Colon cancer Neg Hx     History  Substance Use Topics  . Smoking status: Former Smoker    Quit date: 05/07/1969  . Smokeless tobacco: Current User    Types: Snuff  . Alcohol Use: 2.4 oz/week    4 Cans of beer per week     ETOH      Review of Systems  Constitutional:  Negative for fever.  HENT: Negative for congestion, sore throat and neck pain.   Eyes: Negative.   Respiratory: Negative for cough, chest tightness, shortness of breath and wheezing.   Cardiovascular: Negative for chest pain.  Gastrointestinal: Negative for nausea and abdominal pain.  Genitourinary: Negative.   Musculoskeletal: Negative for joint swelling and arthralgias.  Skin: Negative.  Negative for rash and wound.  Neurological: Negative for dizziness, weakness, light-headedness, numbness and headaches.  Hematological: Negative.   Psychiatric/Behavioral: Negative.     Allergies  Black pepper  Home Medications   Current Outpatient Rx  Name Route Sig Dispense Refill  . ALBUTEROL SULFATE HFA 108 (90 BASE) MCG/ACT IN AERS Inhalation Inhale 2 puffs into the lungs every 6 (six) hours as needed. For shortness of breath    . VAPOR INHALER IN Inhalation Inhale 1-2 puffs into the lungs as needed. For congestion    . DIAZEPAM 5 MG PO TABS Oral Take 5 mg by mouth 2 (two) times daily.    . ONE-A-DAY MENS 50+ ADVANTAGE PO Oral Take 1 tablet by mouth at bedtime.    Marland Kitchen POLYETHYLENE GLYCOL 3350 PO POWD Oral Take 17 g by mouth daily. 255 g 0  . TRAMADOL HCL 50 MG PO TABS Oral Take 1 tablet (50 mg total) by mouth every 6 (  six) hours as needed for pain. 15 tablet 0    BP 92/65  Pulse 87  Temp(Src) 98.2 F (36.8 C) (Oral)  Resp 22  Ht 5\' 4"  (1.626 m)  Wt 122 lb (55.339 kg)  BMI 20.94 kg/m2  SpO2 95%  Physical Exam  Nursing note and vitals reviewed. Constitutional: He is oriented to person, place, and time. He appears well-developed and well-nourished.  HENT:  Head: Normocephalic and atraumatic.  Eyes: Conjunctivae are normal.  Neck: Normal range of motion.  Cardiovascular: Normal rate, regular rhythm, normal heart sounds and intact distal pulses.   Pulmonary/Chest: Effort normal and breath sounds normal. He has no wheezes.  Abdominal: Soft. Bowel sounds are normal. There is no  tenderness.  Musculoskeletal: Normal range of motion.  Neurological: He is alert and oriented to person, place, and time.  Skin: Skin is warm and dry.  Psychiatric: He has a normal mood and affect.    ED Course  Procedures (including critical care time)  Labs Reviewed - No data to display No results found.   1. Medication refill       MDM  Albuteral mdi sample given to pt.  Pt encouraged to f/u with pcp or return here with any problems or flare up of sx.        Candis Musa, PA 01/27/12 1705

## 2012-01-27 NOTE — ED Notes (Signed)
Alert, NAd, says he needs a refill of his albuterol inhaler.

## 2012-01-27 NOTE — Discharge Instructions (Signed)
Medication Refill, Emergency Department  We have refilled your medication today as a courtesy to you. It is best for your medical care, however, to take care of getting refills done through your primary caregiver's office. They have your records and can do a better job of follow-up than we can in the emergency department.  On maintenance medications, we often only prescribe enough medications to get you by until you are able to see your regular caregiver. This is a more expensive way to refill medications.  In the future, please plan for refills so that you will not have to use the emergency department for this.  Thank you for your help. Your help allows us to better take care of the daily emergencies that enter our department.  Document Released: 02/03/2004 Document Revised: 10/06/2011 Document Reviewed: 10/17/2005  [employer] Patient Information 2012 [employer].

## 2012-01-31 NOTE — ED Provider Notes (Signed)
Medical screening examination/treatment/procedure(s) were performed by non-physician practitioner and as supervising physician I was immediately available for consultation/collaboration.  Donnetta Hutching, MD 01/31/12 [phone]

## 2012-02-23 ENCOUNTER — Encounter: Payer: Self-pay | Admitting: Gastroenterology

## 2012-02-29 ENCOUNTER — Encounter: Payer: Self-pay | Admitting: Internal Medicine

## 2012-03-01 ENCOUNTER — Ambulatory Visit: Payer: PRIVATE HEALTH INSURANCE | Admitting: Urgent Care

## 2012-03-01 ENCOUNTER — Ambulatory Visit (INDEPENDENT_AMBULATORY_CARE_PROVIDER_SITE_OTHER): Payer: Medicaid Other | Admitting: Urgent Care

## 2012-03-01 ENCOUNTER — Encounter: Payer: Self-pay | Admitting: Urgent Care

## 2012-03-01 VITALS — BP 109/67 | HR 92 | Temp 98.1°F | Ht 60.0 in | Wt 128.2 lb

## 2012-03-01 DIAGNOSIS — F1020 Alcohol dependence, uncomplicated: Secondary | ICD-10-CM

## 2012-03-01 DIAGNOSIS — K922 Gastrointestinal hemorrhage, unspecified: Secondary | ICD-10-CM | POA: Insufficient documentation

## 2012-03-01 DIAGNOSIS — D649 Anemia, unspecified: Secondary | ICD-10-CM

## 2012-03-01 NOTE — Patient Instructions (Addendum)
Go get your blood work now [place] need a colonoscopy and possible EGD (upper endoscopy) with Dr. Darrick Penna If you have severe bleeding, dizziness, chest pain, trouble breathing or feeling your heart is racing, you need to go directly to the emergency room by calling 911  Gastrointestinal Bleeding Gastrointestinal (GI) bleeding is bleeding from the gut or any place between your mouth and anus. If bleeding is slow, you may be allowed to go home. If there is a lot of bleeding, hospitalization and observation are often required. SYMPTOMS   You vomit bright red blood or material that looks like coffee grounds.   You have blood in your stools or the stools look black and tarry.  DIAGNOSIS  Your caregiver may diagnose your condition by taking a history and a physical exam. More tests may be needed, including:  X-rays.   EGD (esophagogastroduodenoscopy), which looks at your esophagus, stomach, and small bowel through a flexible telescope-like instrument.   Colonoscopy, which looks at your colon/large bowel through a flexible telescope-like instrument.   Biopsies, which remove a small sample of tissue to examine under a microscope.  Finding out the results of your test Not all test results are available during your visit. If your test results are not back during the visit, make an appointment with your caregiver to find out the results. Do not assume everything is normal if you have not heard from your caregiver or the medical facility. It is important for you to follow up on all of your test results. HOME CARE INSTRUCTIONS   Follow instructions as suggested by your caregiver regarding medicines. Do not take aspirin, drink alcohol, or take medicines for pain and arthritis unless your caregiver says it is okay.   Get the suggested follow-up care when the tests are done.  SEEK IMMEDIATE MEDICAL CARE IF:   Your bleeding increases or you become lightheaded, weak, or pass out (faint).   You experience  severe cramps in your stomach, back, or belly (abdomen).   You pass large clots.   The problems which brought you in for medical care get worse.  MAKE SURE YOU:   Understand these instructions.   Will watch your condition.   Will get help right away if you are not doing well or get worse.  Document Released: 10/14/2000 Document Revised: 10/06/2011 Document Reviewed: 09/26/2011 [facility] Patient Information 2012 [place], [place].

## 2012-03-01 NOTE — Progress Notes (Signed)
Primary Care Physician:  Fredirick Maudlin, MD, MD Primary Gastroenterologist:  Dr. Darrick Penna  Chief Complaint  Patient presents with  . Rectal Bleeding    HPI:  [name] is a 78 y.o. male here for GI bleed.  He was admitted to [facility] 01/17/2012 and left AMA after an EGD by Dr. Jena Gauss for melena.  EGD showed erosive reflux esophagitis, noncritical Schatzki's ring, antral erosions and a small antral ulcer that appeared innocent as far as recent bleeding is concerned. He has history of gastric and colonic AVMs with last intervention by Dr. Darrick Penna with BiCAP to gastric & colonic AVM in September 2009. Plans were made for colonoscopy, however this was not done before patient left AMA. Dr. Juanetta Gosling reports at least 5 episodes of [name] leaving AMA during recent hospital admissions and being noncompliant with his care.  He has history of alcohol abuse drinking significant amounts of liquor but tells me that he quit 3 months ago. When he was admitted 3/19 his hemoglobin was 5.7 he received 3 units of pRBCs.  Discharge hemoglobin was 9.2.  He has been seeing dark stools.  Denies abdominal pain.  Denies N/V.  Denies abdominal pain.  Denies CP.  +SOBOE.  Denies palpitations.  C/o weight loss, but patient unable to quantify.  Appetite ok.  Denies dysphagia or odynophagia.  Denies constipation or diarrhea.  Denies any over-the-counter NSAIDs.    Past Medical History  Diagnosis Date  . COPD (chronic obstructive pulmonary disease)   . Anxiety   . Alcohol abuse   . Anemia   . Generalized headaches   . Hypertension   . 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 Pulmonary nodule/lesion, solitary   . Schizoaffective disorder   . GI bleed     History of recurrent bleeding that dates back as far as 2004 per records,    Past Surgical History  Procedure Date  . No past surgeries   . Colonoscopy Sept 2009    [facility]: few small AVMs in  ascending colon distal to IC . Ablated via BICAP.   Marland Kitchen Esophagogastroduodenoscopy Sept 2009    [facility]: normal esophagus, small HH, 1-2 AVMs actively oozing in mid stomach, s/p BICAP, path benign   . Esophagogastroduodenoscopy 01/18/2012    Rourk-Erosive reflux esophagitis. Noncritical ring. Antral erosions/ small antral ulcer - appeared innocent  (H pylori serrology negative)    Current Outpatient Prescriptions  Medication Sig Dispense Refill  . albuterol (VENTOLIN HFA) 108 (90 BASE) MCG/ACT inhaler Inhale 2 puffs into the lungs every 6 (six) hours as needed. For shortness of breath      . Aromatic Inhalants (VAPOR INHALER IN) Inhale 1-2 puffs into the lungs as needed. For congestion      . aspirin 81 MG tablet Take 81 mg by mouth daily.      . budesonide-formoterol (SYMBICORT) 160-4.5 MCG/ACT inhaler Inhale 2 puffs into the lungs 2 (two) times daily.      . diazepam (VALIUM) 5 MG tablet Take 5 mg by mouth 2 (two) times daily.      Marland Kitchen SPIRIVA HANDIHALER 18 MCG inhalation capsule Place 18 mcg into inhaler and inhale daily.       . traMADol (ULTRAM) 50 MG tablet Take 50 mg by mouth every 6 (six) hours as needed.       . Multiple Vitamins-Minerals (ONE-A-DAY MENS 50+ ADVANTAGE PO) Take 1 tablet by mouth at  bedtime.        Allergies as of 03/01/2012 - Review Complete 03/01/2012  Allergen Reaction Noted  . Black pepper (piper nigrum) Itching 12/22/2011    Family History  Problem Relation Age of Onset  . Colon cancer Neg Hx     History   Social History  . Marital Status: Married    Spouse Name: N/A    Number of Children: N/A  . Years of Education: N/A   Occupational History  . Not on file.   Social History Main Topics  . Smoking status: Former Smoker    Quit date: 05/07/1969  . Smokeless tobacco: Current User    Types: Snuff  . Alcohol Use: 2.4 oz/week    4 Cans of beer per week     ETOH-pt states quit 12/2011  . Drug Use: Yes     remote marijuana  . Sexually Active: Not on file     Review of Systems: Gen: Denies any fever, chills, sweats, anorexia, fatigue, weakness, malaise, weight loss, and sleep disorder CV: Denies chest pain, angina, palpitations, syncope, orthopnea, PND, peripheral edema, and claudication. Resp: Denies dyspnea at rest, cough, sputum, wheezing, coughing up blood, and pleurisy. GI: Denies vomiting blood, jaundice, and fecal incontinence.   Denies dysphagia or odynophagia. GU : Denies urinary burning, blood in urine, urinary frequency, urinary hesitancy, nocturnal urination, and urinary incontinence. MS: Denies joint pain, limitation of movement, and swelling, stiffness, low back pain, extremity pain. Denies muscle weakness, cramps, atrophy.  Derm: Denies rash, itching, dry skin, hives, moles, warts, or unhealing ulcers.  Psych: Denies depression, anxiety, memory loss, suicidal ideation, hallucinations, paranoia, and confusion. Heme: Denies bruising or enlarged lymph nodes. Neuro:  Denies any headaches, dizziness, paresthesias. Endo:  Denies any problems with DM, thyroid, adrenal function.  Physical Exam: BP 109/67  Pulse 92  Temp(Src) 98.1 F (36.7 C) (Temporal)  Ht 5' (1.524 m)  Wt 128 lb 3.2 oz (58.151 kg)  BMI 25.04 kg/m2 General:   Alert,  Well-developed, thin, pleasant and cooperative in NAD. Head:  Normocephalic and atraumatic. Eyes:  Sclera clear, no icterus.   Conjunctiva pink. Ears:  Normal auditory acuity. Nose:  No deformity, discharge, or lesions. Mouth:  No deformity or lesions,oropharynx pink & moist. Neck:  Supple; no masses or thyromegaly. Lungs:  Clear throughout to auscultation.   No wheezes, crackles, or rhonchi. No acute distress. Heart:  Regular rate and rhythm; no murmurs, clicks, rubs,  or gallops. Abdomen:  Normal bowel sounds.  No bruits.  Soft, non-tender and non-distended without masses, hepatosplenomegaly or hernias noted.  No guarding or rebound tenderness.   Rectal:  Small amt hemoccult positive dark brown  stool was obtained from the vault.   Msk:  Symmetrical without gross deformities. Normal posture. Pulses:  Normal pulses noted. Extremities:  No clubbing or edema. Neurologic:  grossly normal neurologically. Skin:  Intact without significant lesions or rashes. Lymph Nodes:  No significant cervical adenopathy. Psych:  Alert and cooperative.  Appears confused at times throughout conversation, but oriented x3.

## 2012-03-02 ENCOUNTER — Encounter (HOSPITAL_COMMUNITY): Payer: PRIVATE HEALTH INSURANCE | Attending: Gastroenterology

## 2012-03-02 ENCOUNTER — Encounter: Payer: Self-pay | Admitting: Urgent Care

## 2012-03-02 ENCOUNTER — Telehealth: Payer: Self-pay | Admitting: Gastroenterology

## 2012-03-02 DIAGNOSIS — D649 Anemia, unspecified: Secondary | ICD-10-CM | POA: Insufficient documentation

## 2012-03-02 LAB — CBC WITH DIFFERENTIAL/PLATELET
Eosinophils Relative: 0 % (ref 0–5)
Hemoglobin: 8.4 g/dL — ABNORMAL LOW (ref 13.0–17.0)
Lymphocytes Relative: 18 % (ref 12–46)
Lymphs Abs: 0.8 10*3/uL (ref 0.7–4.0)
MCH: 25.4 pg — ABNORMAL LOW (ref 26.0–34.0)
MCV: 83.7 fL (ref 78.0–100.0)
Monocytes Relative: 17 % — ABNORMAL HIGH (ref 3–12)
Neutrophils Relative %: 64 % (ref 43–77)
Platelets: 393 10*3/uL (ref 150–400)
RBC: 3.31 MIL/uL — ABNORMAL LOW (ref 4.22–5.81)
WBC: 4.3 10*3/uL (ref 4.0–10.5)

## 2012-03-02 NOTE — Assessment & Plan Note (Addendum)
[name] is a pleasant 78 y.o. male w/ GI bleeding & anemia.  Recent hospitalization 3/19 with EGD by Dr Jena Gauss without source of active bleeding as above.  Pt left AMA prior to colonoscopy planned.  Hx colonic & gastric AVMS requiring BICAP intervention 2009.   Unfortunately, his history of non-compliance & psychiatric overlay with schizoaffective disorder +/- etoh has delayed his medical treatment.  He tells me he has quit drinking alcohol 3 months ago. I discussed colonscopy and possible EGD ASAP with Dr Darrick Penna with the patient.  He was in agreement with this plan.  I have discussed risks & benefits which include, but are not limited to, bleeding, infection, perforation & drug reaction.  The patient agrees with this plan & written consent will be obtained.   Apparently when our scheduler, Crystal, attempted to set up his procedures he told her that she did not want to do this right now. He would call her back to schedule. He did agree to go get a CBC. Hemoglobin today is 8.4. This is down from 9.2 on 3/21. I discussed with Dr. Darrick Penna. Plans are to set up transfusion at [facility] clinic followed by colonoscopy with possible EGD if needed.  We have been unable to contact patient as he does not have a working phone number.  We will attempt again.  If severe bleeding, dizziness, chest pain, trouble breathing or feeling your heart is racing, go directly to the emergency room by calling 911 GI bleed literature given

## 2012-03-02 NOTE — Progress Notes (Signed)
Quick Note:  Spoke w/ pt- Agrees to transfusion Monday & TCS +/- EGD w/ [facility] Friday. Julie-[facility] set up 2 [facility]s [facility]. Will need type & cross today. Crystal-please set up procedures as ordered for Friday w/ Dr Darrick Penna. Thanks ______

## 2012-03-02 NOTE — Progress Notes (Signed)
Quick Note:  Spoke w/ Dr Darrick Penna. [facility] & they can do transfusion Monday or Tuesday. However, pt has been unable to be contacted & refused procedures yesterday. Please attempt to contact pt to set up transfusion & TCS +/- EGD next week. Discussed w/ Raynelle Fanning who will attempt to contact pt. Please send letter if unable to get by phone. Thanks ______

## 2012-03-02 NOTE — Telephone Encounter (Signed)
I tried to schedule pt's procedure while he was here in the office and offered him days next week, but he refused to schedule- he stated that he didn't know when he would be able to do it- He also seemed confused about the whole process, I asked him if I could call his family or have them call me to schedule this for him but again he refused- he said would call me to schedule- I gave him one of my business cards

## 2012-03-02 NOTE — Telephone Encounter (Signed)
See phone note

## 2012-03-02 NOTE — Assessment & Plan Note (Signed)
See GI bleed.  

## 2012-03-02 NOTE — Assessment & Plan Note (Signed)
Pt states cessation Feb 2013

## 2012-03-03 ENCOUNTER — Emergency Department (HOSPITAL_COMMUNITY)
Admission: EM | Admit: 2012-03-03 | Discharge: 2012-03-03 | Disposition: A | Discharge status: Home / Self Care | Payer: PRIVATE HEALTH INSURANCE | Attending: Emergency Medicine | Admitting: Emergency Medicine

## 2012-03-03 ENCOUNTER — Encounter (HOSPITAL_COMMUNITY): Payer: Self-pay

## 2012-03-03 DIAGNOSIS — J4489 Other specified chronic obstructive pulmonary disease: Secondary | ICD-10-CM | POA: Insufficient documentation

## 2012-03-03 DIAGNOSIS — Z87891 Personal history of nicotine dependence: Secondary | ICD-10-CM | POA: Insufficient documentation

## 2012-03-03 DIAGNOSIS — J449 Chronic obstructive pulmonary disease, unspecified: Secondary | ICD-10-CM | POA: Insufficient documentation

## 2012-03-03 DIAGNOSIS — R002 Palpitations: Secondary | ICD-10-CM

## 2012-03-03 DIAGNOSIS — I10 Essential (primary) hypertension: Secondary | ICD-10-CM | POA: Insufficient documentation

## 2012-03-03 DIAGNOSIS — F259 Schizoaffective disorder, unspecified: Secondary | ICD-10-CM | POA: Insufficient documentation

## 2012-03-03 NOTE — ED Provider Notes (Signed)
History  This chart was scribed for [name] Gaskins, MD by Bennett Scrape. This patient was seen in room APA15/APA15 and the patient's care was started at 10:23AM.  CSN: [id number]  Arrival date & time 03/03/12  1018   First MD Initiated Contact with Patient 03/03/12 1023      Chief Complaint  Patient presents with  . Tachycardia    Patient is a 78 y.o. male presenting with palpitations. The history is provided by the patient. No language interpreter was used.  Palpitations  This is a new problem. The current episode started more than 2 days ago. The problem occurs constantly. The problem has not changed since onset.The problem is associated with exercise. He has tried nothing for the symptoms. Risk factors include being male and smoking/tobacco exposure.    Victorhugo Preis is a 78 y.o. male who presents to the Emergency Department complaining of 3 days of sudden onset, non-changing, intermittent palpitations that occur when he walks. Pt was seen by PCP yesterday and was told to come here for a blood transfusion. Pt is currently scheduled for a blood transfusion and EGD in 2 days. PCP also recommended a colonoscopy to be scheduled for next week as well. He also c/o hematochezia that he states occurs when he drinks liqour. He denies chest pain, emesis, abdominal pain and SOB. He has a h/o COPD, alcohol abuse, HTN and anxiety. He is a former smoker and former alcohol user. No syncope is reported  PCP is Dr. Juanetta Gosling.  Past Medical History  Diagnosis Date  . COPD (chronic obstructive pulmonary disease)   . Anxiety   . Alcohol abuse   . Anemia   . Generalized headaches   . Hypertension   . 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 Pulmonary nodule/lesion, solitary   . Schizoaffective disorder   . GI bleed     History of recurrent bleeding that dates back as far as 2004 per records,    Past Surgical History  Procedure Date  .  No past surgeries   . Colonoscopy Sept 2009    [facility]: few small AVMs in ascending colon distal to IC . Ablated via BICAP.   Marland Kitchen Esophagogastroduodenoscopy Sept 2009    [facility]: normal esophagus, small HH, 1-2 AVMs actively oozing in mid stomach, s/p BICAP, path benign   . Esophagogastroduodenoscopy 01/18/2012    Rourk-Erosive reflux esophagitis. Noncritical ring. Antral erosions/ small antral ulcer - appeared innocent  (H pylori serrology negative)    Family History  Problem Relation Age of Onset  . Colon cancer Neg Hx     History  Substance Use Topics  . Smoking status: Former Smoker    Quit date: 05/07/1969  . Smokeless tobacco: Current User    Types: Snuff  . Alcohol Use: 2.4 oz/week    4 Cans of beer per week     ETOH-pt states quit 12/2011      Review of Systems  A complete 10 system review of systems was obtained and all systems are negative except as noted in the HPI and PMH.   Allergies  Black pepper  Home Medications   Current Outpatient Rx  Name Route Sig Dispense Refill  . ALBUTEROL SULFATE HFA 108 (90 BASE) MCG/ACT IN AERS Inhalation Inhale 2 puffs into the lungs every 6 (six) hours as needed. For shortness of breath    . VAPOR INHALER IN Inhalation Inhale 1-2 puffs  into the lungs as needed. For congestion    . ASPIRIN 81 MG PO TABS Oral Take 81 mg by mouth daily.    . BUDESONIDE-FORMOTEROL FUMARATE 160-4.5 MCG/ACT IN AERO Inhalation Inhale 2 puffs into the lungs 2 (two) times daily.    Marland Kitchen DIAZEPAM 5 MG PO TABS Oral Take 5 mg by mouth 2 (two) times daily.    . ONE-A-DAY MENS 50+ ADVANTAGE PO Oral Take 1 tablet by mouth at bedtime.    Marland Kitchen SPIRIVA HANDIHALER 18 MCG IN CAPS Inhalation Place 18 mcg into inhaler and inhale daily.     . TRAMADOL HCL 50 MG PO TABS Oral Take 50 mg by mouth every 6 (six) hours as needed.       Triage Vitals: BP 106/58  Pulse 88  Temp(Src) 97.2 F (36.2 C) (Oral)  Resp 20  Ht 5\' 2"  (1.575 m)  Wt 128 lb (58.06 kg)  BMI 23.41 kg/m2  SpO2  95%  Physical Exam  Nursing note and vitals reviewed.  CONSTITUTIONAL: Well developed/well nourished HEAD AND FACE: Normocephalic/atraumatic EYES: EOMI/PERRL conjunctiva pink ENMT: Mucous membranes moist NECK: supple no meningeal signs SPINE:entire spine nontender CV: S1/S2 noted, no murmurs/rubs/gallops noted LUNGS: Lungs are clear to auscultation bilaterally, no apparent distress ABDOMEN: soft, nontender, no rebound or guarding GU:no cva tenderness NEURO: Pt is awake/alert, moves all extremitiesx4 EXTREMITIES: pulses normal, full ROM SKIN: warm, color normal PSYCH: no abnormalities of mood noted  ED Course  Procedures   DIAGNOSTIC STUDIES: Oxygen Saturation is 95% on room air, normal by my interpretation.    COORDINATION OF CARE: 10:43AM-Advised pt of plan to call Dr. Juanetta Gosling and pt agreed.  10:45AM-Discussed with pt that he is already scheduled for a blood transfusion and EGD on Monday. Advised pt that he will need to call PCP to set up colonoscopy for next week. Recent CBC reviewed 11:07AM-Discussed discharge with pt and pt is comfortable being discharged home.  EPIC note sent this PCP and GI physician.    The patient appears reasonably screened and/or stabilized for discharge and I doubt any other medical condition or other [facility] requiring further screening, evaluation, or treatment in the ED at this time prior to discharge.   MDM  Nursing notes reviewed and considered in documentation Previous records reviewed and considered     Date: 03/03/2012  Rate: 88  Rhythm: normal sinus rhythm  QRS Axis: normal  Intervals: normal  ST/T Wave abnormalities: normal  Conduction Disutrbances:none  Narrative Interpretation:   Old EKG Reviewed: unchanged PVC noted    I personally performed the services described in this documentation, which was scribed in my presence. The recorded information has been reviewed and considered.      [name] Gaskins, MD 03/03/12 1146

## 2012-03-03 NOTE — ED Notes (Signed)
Pt sent by dr.office, told his blood was low, he also feels like his heart is racing x 3 days.

## 2012-03-03 NOTE — ED Notes (Addendum)
pt was seen in Dr. Jena Gauss office yesterday and was told that the needed to have EGD and colonoscopy done. Pt also had blood work done yesterday. Pt states that he was told to come to hospital for blood and to have these procedures done, asked pt if dr office had called him and told him to come to er today or what his blood work results were and pt states no. Pt denies any bleeding since being in dr office, pt does seem sob on exertion.

## 2012-03-03 NOTE — Discharge Instructions (Signed)
PLEASE FOLLOWUP AS SCHEDULED FOR YOUR BLOOD TRANSFUSION AND YOUR COLONOSCOPY ON Monday BE SURE TO CALL DR FIELDS FOR THIS.

## 2012-03-05 ENCOUNTER — Other Ambulatory Visit: Payer: Self-pay | Admitting: Gastroenterology

## 2012-03-05 ENCOUNTER — Encounter (HOSPITAL_BASED_OUTPATIENT_CLINIC_OR_DEPARTMENT_OTHER): Payer: PRIVATE HEALTH INSURANCE

## 2012-03-05 VITALS — BP 143/82 | HR 66 | Temp 98.1°F | Resp 16

## 2012-03-05 DIAGNOSIS — D649 Anemia, unspecified: Secondary | ICD-10-CM

## 2012-03-05 LAB — HEMOGLOBIN AND HEMATOCRIT, BLOOD
HCT: 25.9 % — ABNORMAL LOW (ref 39.0–52.0)
Hemoglobin: 8.1 g/dL — ABNORMAL LOW (ref 13.0–17.0)

## 2012-03-05 MED ORDER — SODIUM CHLORIDE 0.9 % IV SOLN
250.0000 mL | Freq: Once | INTRAVENOUS | Status: AC
  Administered 2012-03-05: 12:00:00 via INTRAVENOUS

## 2012-03-05 NOTE — Progress Notes (Signed)
Tolerated blood transfusion well. 

## 2012-03-05 NOTE — Progress Notes (Signed)
Faxed to PCP

## 2012-03-06 ENCOUNTER — Encounter (HOSPITAL_COMMUNITY): Payer: Self-pay

## 2012-03-06 ENCOUNTER — Other Ambulatory Visit: Payer: Self-pay | Admitting: Gastroenterology

## 2012-03-06 ENCOUNTER — Telehealth: Payer: Self-pay | Admitting: Gastroenterology

## 2012-03-06 DIAGNOSIS — R110 Nausea: Secondary | ICD-10-CM

## 2012-03-06 DIAGNOSIS — R109 Unspecified abdominal pain: Secondary | ICD-10-CM

## 2012-03-06 LAB — TYPE AND SCREEN

## 2012-03-06 NOTE — Telephone Encounter (Signed)
Tried to call pt again to inform him about his procedure scheduled for 05/10- but no answer and I could not leave a message

## 2012-03-06 NOTE — Telephone Encounter (Signed)
Did you try all 3 numbers? [name] was able to get in touch with his wife on Friday. You may want to ask her which number she tried.

## 2012-03-06 NOTE — Telephone Encounter (Signed)
I called all 3 #s in the computer, finally got in touch with pt's wife @ [phone]- she will try to get in touch with him and have him call us

## 2012-03-06 NOTE — Telephone Encounter (Signed)
Tried to call pt again- this time his oldest son answered- he stated that his dad was aware we were trying to get in touch with him but he was not sure why pt would not call us back- he will leave him another message

## 2012-03-07 NOTE — Telephone Encounter (Signed)
REVIEWED.  

## 2012-03-07 NOTE — Telephone Encounter (Signed)
IF PT IS A NS FOR HIS PROCEDURE, HE WILL BE DISCHARGED FROM THE PRACTICE.

## 2012-03-07 NOTE — Telephone Encounter (Signed)
OK, I will FWD to [facility] He is aware he needs colonsocpy =/- EGD but refusing to schedule last week here & via phone, now not returning phone calls. Unless pt picks up prep, he will not be ready for TCS Friday.

## 2012-03-07 NOTE — Telephone Encounter (Signed)
Called again this morning- no answer and could not leave a message-

## 2012-03-07 NOTE — Telephone Encounter (Signed)
Pt stopped by the office- I went over all his instructions and gave him a sample of the prep-I also tried to relay the importance of this procedure - he stated he would try to be there

## 2012-03-08 MED ORDER — SODIUM CHLORIDE 0.45 % IV SOLN
Freq: Once | INTRAVENOUS | Status: AC
  Administered 2012-03-09: 1000 mL via INTRAVENOUS

## 2012-03-09 ENCOUNTER — Ambulatory Visit (HOSPITAL_COMMUNITY)
Admission: RE | Admit: 2012-03-09 | Discharge: 2012-03-09 | Disposition: A | Discharge status: Home / Self Care | Payer: PRIVATE HEALTH INSURANCE | Source: Ambulatory Visit | Attending: Gastroenterology | Admitting: Gastroenterology

## 2012-03-09 ENCOUNTER — Encounter (HOSPITAL_COMMUNITY)
Admission: RE | Disposition: A | Discharge status: Home / Self Care | Payer: Self-pay | Source: Ambulatory Visit | Attending: Gastroenterology

## 2012-03-09 ENCOUNTER — Encounter (HOSPITAL_COMMUNITY): Payer: Self-pay | Admitting: *Deleted

## 2012-03-09 DIAGNOSIS — J4489 Other specified chronic obstructive pulmonary disease: Secondary | ICD-10-CM | POA: Insufficient documentation

## 2012-03-09 DIAGNOSIS — K2990 Gastroduodenitis, unspecified, without bleeding: Secondary | ICD-10-CM

## 2012-03-09 DIAGNOSIS — D649 Anemia, unspecified: Secondary | ICD-10-CM

## 2012-03-09 DIAGNOSIS — Z7982 Long term (current) use of aspirin: Secondary | ICD-10-CM | POA: Insufficient documentation

## 2012-03-09 DIAGNOSIS — I10 Essential (primary) hypertension: Secondary | ICD-10-CM | POA: Insufficient documentation

## 2012-03-09 DIAGNOSIS — K449 Diaphragmatic hernia without obstruction or gangrene: Secondary | ICD-10-CM | POA: Insufficient documentation

## 2012-03-09 DIAGNOSIS — J449 Chronic obstructive pulmonary disease, unspecified: Secondary | ICD-10-CM | POA: Insufficient documentation

## 2012-03-09 DIAGNOSIS — D126 Benign neoplasm of colon, unspecified: Secondary | ICD-10-CM | POA: Insufficient documentation

## 2012-03-09 DIAGNOSIS — D509 Iron deficiency anemia, unspecified: Secondary | ICD-10-CM | POA: Insufficient documentation

## 2012-03-09 DIAGNOSIS — K222 Esophageal obstruction: Secondary | ICD-10-CM | POA: Insufficient documentation

## 2012-03-09 DIAGNOSIS — K2940 Chronic atrophic gastritis without bleeding: Secondary | ICD-10-CM | POA: Insufficient documentation

## 2012-03-09 DIAGNOSIS — K648 Other hemorrhoids: Secondary | ICD-10-CM | POA: Insufficient documentation

## 2012-03-09 DIAGNOSIS — K2970 Gastritis, unspecified, without bleeding: Secondary | ICD-10-CM

## 2012-03-09 HISTORY — PX: ESOPHAGOGASTRODUODENOSCOPY: SHX5428

## 2012-03-09 HISTORY — PX: COLONOSCOPY: SHX5424

## 2012-03-09 SURGERY — COLONOSCOPY
Anesthesia: Moderate Sedation

## 2012-03-09 MED ORDER — STERILE WATER FOR IRRIGATION IR SOLN
Status: DC | PRN
  Administered 2012-03-09: 11:00:00

## 2012-03-09 MED ORDER — MIDAZOLAM HCL 5 MG/5ML IJ SOLN
INTRAMUSCULAR | Status: DC | PRN
  Administered 2012-03-09: 2 mg via INTRAVENOUS
  Administered 2012-03-09 (×2): 1 mg via INTRAVENOUS
  Administered 2012-03-09: 2 mg via INTRAVENOUS
  Administered 2012-03-09: 1 mg via INTRAVENOUS

## 2012-03-09 MED ORDER — MEPERIDINE HCL 100 MG/ML IJ SOLN
INTRAMUSCULAR | Status: DC | PRN
  Administered 2012-03-09 (×5): 25 mg via INTRAVENOUS

## 2012-03-09 MED ORDER — MIDAZOLAM HCL 5 MG/5ML IJ SOLN
INTRAMUSCULAR | Status: AC
  Filled 2012-03-09: qty 5

## 2012-03-09 MED ORDER — MEPERIDINE HCL 100 MG/ML IJ SOLN
INTRAMUSCULAR | Status: AC
  Filled 2012-03-09: qty 1

## 2012-03-09 NOTE — Discharge Instructions (Signed)
You had 1 polyps removed. You have internal hemorrhoids. You have gastritis,  & a HIATAL HERNIA. I biopsied your stomach, & duodenum.  Do not take aspirin, ibuprofen, naproxen, Aleve, or Motrin. Do not use BC or Goody powders. AVOID ITEMS THAT TRIGGER GASTRITIS. SEE INFO BELOW. FOLLOW A HIGH FIBER/LOW FAT DIET. AVOID ITEMS THAT CAUSE BLOATING. SEE INFO BELOW. YOUR BIOPSY RESULTS WILL BE BACK IN 7 DAYS.    ENDOSCOPY Care After Read the instructions outlined below and refer to this sheet in the next week. These discharge instructions provide you with general information on caring for yourself after you leave the hospital. While your treatment has been planned according to the most current medical practices available, unavoidable complications occasionally occur. If you have any problems or questions after discharge, call DR. Sherma Vanmetre, [phone].  ACTIVITY  You may resume your regular activity, but move at a slower pace for the next 24 hours.   Take frequent rest periods for the next 24 hours.   Walking will help get rid of the air and reduce the bloated feeling in your belly (abdomen).   No driving for 24 hours (because of the medicine (anesthesia) used during the test).   You may shower.   Do not sign any important legal documents or operate any machinery for 24 hours (because of the anesthesia used during the test).    NUTRITION  Drink plenty of fluids.   You may resume your normal diet as instructed by your doctor.   Begin with a light meal and progress to your normal diet. Heavy or fried foods are harder to digest and may make you feel sick to your stomach (nauseated).   Avoid alcoholic beverages for 24 hours or as instructed.    MEDICATIONS  You may resume your normal medications.   WHAT YOU CAN EXPECT TODAY  Some feelings of bloating in the abdomen.   Passage of more gas than usual.   Spotting of blood in your stool or on the toilet paper  .  IF YOU HAD  POLYPS REMOVED DURING THE ENDOSCOPY:  Eat a soft diet IF YOU HAVE NAUSEA, BLOATING, ABDOMINAL PAIN, OR VOMITING.    FINDING OUT THE RESULTS OF YOUR TEST Not all test results are available during your visit. DR. Darrick Penna WILL CALL YOU WITHIN 7 DAYS OF YOUR PROCEDUE WITH YOUR RESULTS. Do not assume everything is normal if you have not heard from DR. Gabriela Irigoyen IN ONE WEEK, CALL HER OFFICE AT [phone].  SEEK IMMEDIATE MEDICAL ATTENTION AND CALL THE OFFICE: [phone] IF:  You have more than a spotting of blood in your stool.   Your belly is swollen (abdominal distention).   You are nauseated or vomiting.   You have a temperature over 101F.   You have abdominal pain or discomfort that is severe or gets worse throughout the day.   Gastritis  Gastritis is an inflammation (the body's way of reacting to injury and/or infection) of the stomach. It is often caused by viral or bacterial (germ) infections. It can also be caused BY ASPIRIN, BC/GOODY POWDER'S, (IBUPROFEN) MOTRIN, OR ALEVE (NAPROXEN), chemicals (including alcohol), SPICY FOODS, and medications. This illness may be associated with generalized malaise (feeling tired, not well), UPPER ABDOMINAL STOMACH cramps, and fever. One common bacterial cause of gastritis is an organism known as H. Pylori. This can be treated with antibiotics.    Hiatal Hernia A hiatal hernia occurs when a part of the stomach slides above the diaphragm. The diaphragm is the  thin muscle separating the belly (abdomen) from the chest. A hiatal hernia can be something you are born with or develop over time. Hiatal hernias may allow stomach acid to flow back into your esophagus, the tube which carries food from your mouth to your stomach. If this acid causes problems it is called GERD (gastro-esophageal reflux disease).   SYMPTOMS Common symptoms of GERD are heartburn (burning in your chest). This is worse when lying down or bending over. It may also cause belching and  indigestion. Some of the things which make GERD worse are:  Increased weight pushes on stomach making acid rise more easily.   Smoking markedly increases acid production.   Alcohol decreases lower esophageal sphincter pressure (valve between stomach and esophagus), allowing acid from stomach into esophagus.   Late evening meals and going to bed with a full stomach increases pressure.   Anything that causes an increase in acid production.    HOME CARE INSTRUCTIONS  Try to achieve and maintain an ideal body weight.   Avoid drinking alcoholic beverages.   DO NOT smokE.   Do not wear tight clothing around your chest or stomach.   Eat smaller meals and eat more frequently. This keeps your stomach from getting too full. Eat slowly.   Do not lie down for 2 or 3 hours after eating. Do not eat or drink anything 1 to 2 hours before going to bed.   Avoid caffeine beverages (colas, coffee, cocoa, tea), fatty foods, citrus fruits and all other foods and drinks that contain acid and that seem to increase the problems.   Avoid bending over, especially after eating OR STRAINING. Anything that increases the pressure in your belly increases the amount of acid that may be pushed up into your esophagus.    High-Fiber Diet A high-fiber diet changes your normal diet to include more whole grains, legumes, fruits, and vegetables. Changes in the diet involve replacing refined carbohydrates with unrefined foods. The calorie level of the diet is essentially unchanged. The Dietary Reference Intake (recommended amount) for adult males is 38 grams per day. For adult females, it is 25 grams per day. Pregnant and lactating women should consume 28 grams of fiber per day. Fiber is the intact part of a plant that is not broken down during digestion. Functional fiber is fiber that has been isolated from the plant to provide a beneficial effect in the body. PURPOSE  Increase stool bulk.   Ease and regulate bowel  movements.   Lower cholesterol.  INDICATIONS THAT YOU NEED MORE FIBER  Constipation and hemorrhoids.   Uncomplicated diverticulosis (intestine condition) and irritable bowel syndrome.   Weight management.   As a protective measure against hardening of the arteries (atherosclerosis), diabetes, and cancer.   GUIDELINES FOR INCREASING FIBER IN THE DIET  Start adding fiber to the diet slowly. A gradual increase of about 5 more grams (2 slices of whole-wheat bread, 2 servings of most fruits or vegetables, or 1 bowl of high-fiber cereal) per day is best. Too rapid an increase in fiber may result in constipation, flatulence, and bloating.   Drink enough water and fluids to keep your urine clear or pale yellow. Water, juice, or caffeine-free drinks are recommended. Not drinking enough fluid may cause constipation.   Eat a variety of high-fiber foods rather than one type of fiber.   Try to increase your intake of fiber through using high-fiber foods rather than fiber pills or supplements that contain small amounts of fiber.  The goal is to change the types of food eaten. Do not supplement your present diet with high-fiber foods, but replace foods in your present diet.  INCLUDE A VARIETY OF FIBER SOURCES  Replace refined and processed grains with whole grains, canned fruits with fresh fruits, and incorporate other fiber sources. White rice, white breads, and most bakery goods contain little or no fiber.   Brown whole-grain rice, buckwheat oats, and many fruits and vegetables are all good sources of fiber. These include: broccoli, Brussels sprouts, cabbage, cauliflower, beets, sweet potatoes, white potatoes (skin on), carrots, tomatoes, eggplant, squash, berries, fresh fruits, and dried fruits.   Cereals appear to be the richest source of fiber. Cereal fiber is found in whole grains and bran. Bran is the fiber-rich outer coat of cereal grain, which is largely removed in refining. In whole-grain  cereals, the bran remains. In breakfast cereals, the largest amount of fiber is found in those with "bran" in their names. The fiber content is sometimes indicated on the label.   You may need to include additional fruits and vegetables each day.   In baking, for 1 cup white flour, you may use the following substitutions:   1 cup whole-wheat flour minus 2 tablespoons.   1/2 cup white flour plus 1/2 cup whole-wheat flour.   Low-Fat Diet BREADS, CEREALS, PASTA, RICE, DRIED PEAS, AND BEANS These products are high in carbohydrates and most are low in fat. Therefore, they can be increased in the diet as substitutes for fatty foods. They too, however, contain calories and should not be eaten in excess. Cereals can be eaten for snacks as well as for breakfast.  Include foods that contain fiber (fruits, vegetables, whole grains, and legumes). Research shows that fiber may lower blood cholesterol levels, especially the water-soluble fiber found in fruits, vegetables, oat products, and legumes. FRUITS AND VEGETABLES It is good to eat fruits and vegetables. Besides being sources of fiber, both are rich in vitamins and some minerals. They help you get the daily allowances of these nutrients. Fruits and vegetables can be used for snacks and desserts. MEATS Limit lean meat, chicken, [place], and fish to no more than 6 ounces per day. Beef, Pork, and Lamb Use lean cuts of beef, pork, and lamb. Lean cuts include:  Extra-lean ground beef.  Arm roast.  Sirloin tip.  Center-cut ham.  Round steak.  Loin chops.  Rump roast.  Tenderloin.  Trim all fat off the outside of meats before cooking. It is not necessary to severely decrease the intake of red meat, but lean choices should be made. Lean meat is rich in protein and contains a highly absorbable form of iron. Premenopausal women, in particular, should avoid reducing lean red meat because this could increase the risk for low red blood cells (iron-deficiency  anemia). The organ meats, such as liver, sweetbreads, kidneys, and brain are very rich in cholesterol. They should be limited. Chicken and [place] These are good sources of protein. The fat of poultry can be reduced by removing the skin and underlying fat layers before cooking. Chicken and [place] can be substituted for lean red meat in the diet. Poultry should not be fried or covered with high-fat sauces. Fish and Shellfish Fish is a good source of protein. Shellfish contain cholesterol, but they usually are low in saturated fatty acids. The preparation of fish is important. Like chicken and [place], they should not be fried or covered with high-fat sauces. EGGS Egg whites contain no fat or cholesterol.  They can be eaten often. Try 1 to 2 egg whites instead of whole eggs in recipes or use egg substitutes that do not contain yolk. MILK AND DAIRY PRODUCTS Use skim or 1% milk instead of 2% or whole milk. Decrease whole milk, natural, and processed cheeses. Use nonfat or low-fat (2%) cottage cheese or low-fat cheeses made from vegetable oils. Choose nonfat or low-fat (1 to 2%) yogurt. Experiment with evaporated skim milk in recipes that call for heavy cream. Substitute low-fat yogurt or low-fat cottage cheese for sour cream in dips and salad dressings. Have at least 2 servings of low-fat dairy products, such as 2 glasses of skim (or 1%) milk each day to help get your daily calcium intake.  FATS AND OILS Reduce the total intake of fats, especially saturated fat. Butterfat, lard, and beef fats are high in saturated fat and cholesterol. These should be avoided as much as possible. Vegetable fats do not contain cholesterol, but certain vegetable fats, such as coconut oil, palm oil, and palm kernel oil are very high in saturated fats. These should be limited. These fats are often used in bakery goods, processed foods, popcorn, oils, and nondairy creamers. Vegetable shortenings and some peanut butters contain  hydrogenated oils, which are also saturated fats. Read the labels on these foods and check for saturated vegetable oils. Unsaturated vegetable oils and fats do not raise blood cholesterol. However, they should be limited because they are fats and are high in calories. Total fat should still be limited to 30% of your daily caloric intake. Desirable liquid vegetable oils are corn oil, cottonseed oil, olive oil, canola oil, safflower oil, soybean oil, and sunflower oil. Peanut oil is not as good, but small amounts are acceptable. Buy a heart-healthy tub margarine that has no partially hydrogenated oils in the ingredients. Mayonnaise and salad dressings often are made from unsaturated fats, but they should also be limited because of their high calorie and fat content. Seeds, nuts, peanut butter, olives, and avocados are high in fat, but the fat is mainly the unsaturated type. These foods should be limited mainly to avoid excess calories and fat. OTHER EATING TIPS Snacks  Most sweets should be limited as snacks. They tend to be rich in calories and fats, and their caloric content outweighs their nutritional value. Some good choices in snacks are graham crackers, melba toast, soda crackers, bagels (no egg), English muffins, fruits, and vegetables. These snacks are preferable to snack crackers, [place] fries, and chips. Popcorn should be air-popped or cooked in small amounts of liquid vegetable oil. Desserts Eat fruit, low-fat yogurt, and fruit ices. AVOID pastries, cake, and cookies. Sherbet, angel food cake, gelatin dessert, frozen low-fat yogurt, or other frozen products that do not contain saturated fat (pure fruit juice bars, frozen ice pops) are also acceptable.  COOKING METHODS Choose those methods that use little or no fat. They include: Poaching.  Braising.  Steaming.  Grilling.  Baking.  Stir-frying.  Broiling.  Microwaving.  Foods can be cooked in a nonstick pan without added fat, or use a  nonfat cooking spray in regular cookware. Limit fried foods and avoid frying in saturated fat. Add moisture to lean meats by using water, broth, cooking wines, and other nonfat or low-fat sauces along with the cooking methods mentioned above. Soups and stews should be chilled after cooking. The fat that forms on top after a few hours in the refrigerator should be skimmed off. When preparing meals, avoid using excess salt. Salt can contribute  to raising blood pressure in some people. EATING AWAY FROM HOME Order entres, potatoes, and vegetables without sauces or butter. When meat exceeds the size of a deck of cards (3 to 4 ounces), the rest can be taken home for another meal. Choose vegetable or fruit salads and ask for low-calorie salad dressings to be served on the side. Use dressings sparingly. Limit high-fat toppings, such as bacon, crumbled eggs, cheese, sunflower seeds, and olives. Ask for heart-healthy tub margarine instead of butter.  Hemorrhoids Hemorrhoids are dilated (enlarged) veins around the rectum. Sometimes clots will form in the veins. This makes them swollen and painful. These are called thrombosed hemorrhoids. Causes of hemorrhoids include:  Constipation.   Straining to have a bowel movement.   HEAVY LIFTING HOME CARE INSTRUCTIONS  Eat a well balanced diet and drink 6 to 8 glasses of water every day to avoid constipation. You may also use a bulk laxative.   Avoid straining to have bowel movements.   Keep anal area dry and clean.   Do not use a donut shaped pillow or sit on the toilet for long periods. This increases blood pooling and pain.   Move your bowels when your body has the urge; this will require less straining and will decrease pain and pressure.

## 2012-03-09 NOTE — H&P (Signed)
Primary Care Physician:  Fredirick Maudlin, MD, MD Primary Gastroenterologist:  Dr. Darrick Penna  Pre-Procedure History & Physical: HPI:  [name] is a 78 y.o. male here for GI BLEED.  Past Medical History  Diagnosis Date  . COPD (chronic obstructive pulmonary disease)   . Anxiety   . Alcohol abuse   . Anemia   . Generalized headaches   . Hypertension   . 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 Pulmonary nodule/lesion, solitary   . Schizoaffective disorder   . GI bleed     History of recurrent bleeding that dates back as far as 2004 per records,    Past Surgical History  Procedure Date  . No past surgeries   . Colonoscopy Sept 2009    SLF: few small AVMs in ascending colon distal to IC . Ablated via BICAP.   Marland Kitchen Esophagogastroduodenoscopy Sept 2009    SLF: normal esophagus, small HH, 1-2 AVMs actively oozing in mid stomach, s/p BICAP, path benign   . Esophagogastroduodenoscopy 01/18/2012    Rourk-Erosive reflux esophagitis. Noncritical ring. Antral erosions/ small antral ulcer - appeared innocent  (H pylori serrology negative)    Prior to Admission medications   Medication Sig Start Date End Date Taking? Authorizing Provider  albuterol (VENTOLIN HFA) 108 (90 BASE) MCG/ACT inhaler Inhale 2 puffs into the lungs every 6 (six) hours as needed. For shortness of breath   Yes Historical Provider, MD  Aromatic Inhalants (VAPOR INHALER IN) Inhale 1-2 puffs into the lungs as needed. For congestion   Yes Historical Provider, MD  aspirin 81 MG tablet Take 81 mg by mouth every other day.    Yes Historical Provider, MD  budesonide-formoterol (SYMBICORT) 160-4.5 MCG/ACT inhaler Inhale 2 puffs into the lungs 2 (two) times daily.   Yes Historical Provider, MD  diazepam (VALIUM) 5 MG tablet Take 5 mg by mouth 2 (two) times daily.   Yes Historical Provider, MD  SPIRIVA HANDIHALER 18 MCG inhalation capsule Place 18 mcg into inhaler and inhale  daily.  02/13/12  Yes Historical Provider, MD  Multiple Vitamins-Minerals (ONE-A-DAY MENS 50+ ADVANTAGE PO) Take 1 tablet by mouth at bedtime.    Historical Provider, MD  traMADol (ULTRAM) 50 MG tablet Take 50 mg by mouth every 6 (six) hours as needed.  02/03/12   Historical Provider, MD    Allergies as of 03/05/2012 - Review Complete 03/03/2012  Allergen Reaction Noted  . Black pepper (piper nigrum) Itching 12/22/2011    Family History  Problem Relation Age of Onset  . Colon cancer Neg Hx     History   Social History  . Marital Status: Married    Spouse Name: N/A    Number of Children: N/A  . Years of Education: N/A   Occupational History  . Not on file.   Social History Main Topics  . Smoking status: Former Smoker    Quit date: 05/07/1969  . Smokeless tobacco: Current User    Types: Snuff  . Alcohol Use: 2.4 oz/week    4 Cans of beer per week     ETOH-pt states quit 12/2011  . Drug Use: Yes     remote marijuana  . Sexually Active: Not on file   Other Topics Concern  . Not on file   Social History Narrative  . No narrative on file    Review of Systems: See HPI, otherwise negative ROS   Physical Exam:  BP 130/83  Pulse 69  Temp(Src) 97.4 F (36.3 C) (Oral)  Resp 18  SpO2 94% General:   Alert,  pleasant and cooperative in NAD Head:  Normocephalic and atraumatic. Neck:  Supple; . Lungs:  Clear throughout to auscultation.    Heart:  Regular rate and rhythm. Abdomen:  Soft, nontender and nondistended. Normal bowel sounds, without guarding, and without rebound.   Neurologic:  Alert and  oriented x4;  grossly normal neurologically.  Impression/Plan:    GI BLEED  PLAN: TCS/EGD TODAY

## 2012-03-10 NOTE — Op Note (Signed)
[facility] [address] [place], [place]  [address]  ENDOSCOPY PROCEDURE REPORT  PATIENT:  [name], [name]  MR#:  [id number] BIRTHDATE:  November 28, 1929, 81 yrs. old  GENDER:  male  ENDOSCOPIST:  Jonette Eva, MD Referred by:  Kari Baars, M.D.  PROCEDURE DATE:  03/09/2012 PROCEDURE:  EGD with biopsy, 43239 ASA CLASS: INDICATIONS:  ANEMIA-MAR 2013 HB 5.7  MEDICATIONS:   Demerol 100 mg IV, Versed 5 mg IV TOPICAL ANESTHETIC:  Cetacaine Spray  DESCRIPTION OF PROCEDURE:     Physical exam was performed. Informed consent was obtained from the patient after explaining the benefits, risks, and alternatives to the procedure.  The patient was connected to the monitor and placed in the left lateral position.  Continuous oxygen was provided by nasal cannula and IV medicine administered through an indwelling cannula.  After administration of sedation, the patient's esophagus was intubated and the EC-3890Li ([id number]) and [id number] ([id number]) endoscope was advanced under direct visualization to the second portion of the duodenum.  The scope was removed slowly by carefully examining the color, texture, anatomy, and integrity of the mucosa on the way out.  The patient was recovered in endoscopy and discharged home in satisfactory condition. <<PROCEDUREIMAGES>>  An PATENT esophageal ring was found.  A 2-3 CM hiatal hernia was found.  Mild gastritis was found & BIOSPIED VIA COLD FORCEPS. NO BARRETT''S. NL DUODENUM. BIOSPIED OBTAIEND VIA COLDFORCEPS TO EVALUATE FOR CELIAC SPRUE AS ANETIOOGY FOR ANEMIA.  COMPLICATIONS:    None  ENDOSCOPIC IMPRESSION: 1) Ring, esophageal 2) SMALL Hiatal hernia 3) Mild gastritis 4) NO SOURCE FOR ANEMIA IDENTIFIED  RECOMMENDATIONS: AWAIT BIOPSIES-IF NO H PYLORI, PT WILL NEED CAPSULE ENDOSCOPY. PT SHOULD AVOID ASA/NSAIDS. HIGH FIBER/LOW FAT DIET OPV IN 3 MOS  REPEAT EXAM:  No  ______________________________ Jonette Eva,  MD  CC:  n. eSIGNED:   Zayed Griffie at 03/10/2012 10:09 AM  Hester Mates, [phone]

## 2012-03-10 NOTE — Op Note (Addendum)
[facility] [address] [place], [place]  [address]  COLONOSCOPY PROCEDURE REPORT  PATIENT:  [name], [name]  MR#:  [id number] BIRTHDATE:  Feb 10, 1930, 81 yrs. old  GENDER:  male  ENDOSCOPIST:  Jonette Eva, MD REF. BY:  Kari Baars, M.D. ASSISTANT:  PROCEDURE DATE:  03/09/2012 PROCEDURE:  Colonoscopy with snare polypectomy  INDICATIONS:  ANEMIA-MAR 2013 HB 5.7  MEDICATIONS:   Demerol 100 mg IV, Versed 5 mg IV  DESCRIPTION OF PROCEDURE:    Physical exam was performed. Informed consent was obtained from the patient after explaining the benefits, risks, and alternatives to procedure.  The patient was connected to monitor and placed in left lateral position. Continuous oxygen was provided by nasal cannula and IV medicine administered through an indwelling cannula.  After administration of sedation and rectal exam, the patient's rectum was intubated and the EC-3890Li ([id number]) colonoscope was advanced under direct visualization to the cecum.  The scope was removed slowly by carefully examining the color, texture, anatomy, and integrity mucosa on the way out.  The patient was recovered in endoscopy and discharged home in satisfactory condition. <<PROCEDUREIMAGES>>  FINDINGS:  There were multiple polyps identified and removed. at the splenic flexure.  Internal Hemorrhoids were found.  PREP QUALITY: GOOD CECAL W/D TIME:    20 minutes  COMPLICATIONS:    None  ENDOSCOPIC IMPRESSION: 1) Polyps, multiple at the splenic flexure 2) Internal hemorrhoids 3) NO SOURCE FOR PROFOUND ANEMIA IDENTIFIED  RECOMMENDATIONS: AWAIT BIOPSIES PROCEED TO EGD NEXT TCS WITH AN OERTUBE  REPEAT EXAM:  No  ______________________________ Jonette Eva, MD  CC:  Kari Baars, M.D.  n. REVISED:  03/10/2012 10:34 AM eSIGNED:   Duncan Dull Jeanett Antonopoulos at 03/10/2012 10:34 AM  [name], [phone]

## 2012-03-12 ENCOUNTER — Encounter (HOSPITAL_COMMUNITY): Payer: Self-pay | Admitting: *Deleted

## 2012-03-12 ENCOUNTER — Telehealth: Payer: Self-pay

## 2012-03-12 ENCOUNTER — Emergency Department (HOSPITAL_COMMUNITY)
Admission: EM | Admit: 2012-03-12 | Discharge: 2012-03-12 | Disposition: A | Discharge status: Home / Self Care | Payer: PRIVATE HEALTH INSURANCE | Attending: Emergency Medicine | Admitting: Emergency Medicine

## 2012-03-12 ENCOUNTER — Emergency Department (HOSPITAL_COMMUNITY): Payer: PRIVATE HEALTH INSURANCE

## 2012-03-12 DIAGNOSIS — J4489 Other specified chronic obstructive pulmonary disease: Secondary | ICD-10-CM | POA: Insufficient documentation

## 2012-03-12 DIAGNOSIS — J449 Chronic obstructive pulmonary disease, unspecified: Secondary | ICD-10-CM | POA: Insufficient documentation

## 2012-03-12 DIAGNOSIS — R0609 Other forms of dyspnea: Secondary | ICD-10-CM | POA: Insufficient documentation

## 2012-03-12 DIAGNOSIS — Z87891 Personal history of nicotine dependence: Secondary | ICD-10-CM | POA: Insufficient documentation

## 2012-03-12 DIAGNOSIS — R0602 Shortness of breath: Secondary | ICD-10-CM | POA: Insufficient documentation

## 2012-03-12 DIAGNOSIS — I10 Essential (primary) hypertension: Secondary | ICD-10-CM | POA: Insufficient documentation

## 2012-03-12 DIAGNOSIS — F259 Schizoaffective disorder, unspecified: Secondary | ICD-10-CM | POA: Insufficient documentation

## 2012-03-12 DIAGNOSIS — R0989 Other specified symptoms and signs involving the circulatory and respiratory systems: Secondary | ICD-10-CM | POA: Insufficient documentation

## 2012-03-12 DIAGNOSIS — R0600 Dyspnea, unspecified: Secondary | ICD-10-CM

## 2012-03-12 DIAGNOSIS — R062 Wheezing: Secondary | ICD-10-CM | POA: Insufficient documentation

## 2012-03-12 DIAGNOSIS — R079 Chest pain, unspecified: Secondary | ICD-10-CM | POA: Insufficient documentation

## 2012-03-12 LAB — CBC
Hemoglobin: 10.6 g/dL — ABNORMAL LOW (ref 13.0–17.0)
MCH: 26.5 pg (ref 26.0–34.0)
MCHC: 31.9 g/dL (ref 30.0–36.0)
RDW: 19.1 % — ABNORMAL HIGH (ref 11.5–15.5)

## 2012-03-12 LAB — COMPREHENSIVE METABOLIC PANEL
AST: 26 U/L (ref 0–37)
Albumin: 3.2 g/dL — ABNORMAL LOW (ref 3.5–5.2)
Alkaline Phosphatase: 65 U/L (ref 39–117)
BUN: 8 mg/dL (ref 6–23)
Chloride: 106 mEq/L (ref 96–112)
Creatinine, Ser: 0.8 mg/dL (ref 0.50–1.35)
Potassium: 3.6 mEq/L (ref 3.5–5.1)
Total Protein: 6.5 g/dL (ref 6.0–8.3)

## 2012-03-12 LAB — DIFFERENTIAL
Basophils Absolute: 0 10*3/uL (ref 0.0–0.1)
Basophils Relative: 1 % (ref 0–1)
Eosinophils Absolute: 0.2 10*3/uL (ref 0.0–0.7)
Monocytes Relative: 12 % (ref 3–12)
Neutro Abs: 3 10*3/uL (ref 1.7–7.7)
Neutrophils Relative %: 68 % (ref 43–77)

## 2012-03-12 LAB — TROPONIN I: Troponin I: <0.3 ng/mL (ref <0.30)

## 2012-03-12 MED ORDER — IPRATROPIUM BROMIDE 0.02 % IN SOLN
0.5000 mg | Freq: Once | RESPIRATORY_TRACT | Status: AC
  Administered 2012-03-12: 0.5 mg via RESPIRATORY_TRACT
  Filled 2012-03-12: qty 2.5

## 2012-03-12 MED ORDER — ALBUTEROL SULFATE HFA 108 (90 BASE) MCG/ACT IN AERS
2.0000 | INHALATION_SPRAY | Freq: Once | RESPIRATORY_TRACT | Status: AC
  Administered 2012-03-12: 2 via RESPIRATORY_TRACT
  Filled 2012-03-12: qty 6.7

## 2012-03-12 MED ORDER — ALBUTEROL SULFATE (5 MG/ML) 0.5% IN NEBU
5.0000 mg | INHALATION_SOLUTION | Freq: Once | RESPIRATORY_TRACT | Status: AC
  Administered 2012-03-12: 5 mg via RESPIRATORY_TRACT
  Filled 2012-03-12: qty 1

## 2012-03-12 NOTE — ED Provider Notes (Signed)
History  This chart was scribed for [name] Lyons, MD by Cherlynn Perches. The patient was seen in room APA08/APA08. Patient's care was started at 1541.  CSN: [id number]  Arrival date & time 03/12/12  1541   First MD Initiated Contact with Patient 03/12/12 1600      Chief Complaint  Patient presents with  . Shortness of Breath    (Consider location/radiation/quality/duration/timing/severity/associated sxs/prior treatment) HPI  [name] is a 78 y.o. male with a h/o COPD, HTN, and anxiety who presents to the Emergency Department complaining of 3 days of sudden onset, constant, mild to moderate shortness of breath with associated chest pain localized to the left chest, difficulty walking, and wheezing. Pt had an endoscopy 3 days ago and reports that symptoms began after the endoscopy. Pt also reports that his stools are usually black, but have not been black for the past few days. Pt denies coughing, nausea, vomiting, and confusion. Pt has been seen in the ED many times in the past few months for similar symptoms. Pt chews tobacco and denies smoking and alcohol use.   Past Medical History  Diagnosis Date  . COPD (chronic obstructive pulmonary disease)   . Anxiety   . Alcohol abuse   . Anemia   . Generalized headaches   . Hypertension   . 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 Pulmonary nodule/lesion, solitary   . Schizoaffective disorder   . GI bleed     History of recurrent bleeding that dates back as far as 2004 per records,    Past Surgical History  Procedure Date  . No past surgeries   . Colonoscopy Sept 2009    SLF: few small AVMs in ascending colon distal to IC . Ablated via BICAP.   Marland Kitchen Esophagogastroduodenoscopy Sept 2009    SLF: normal esophagus, small HH, 1-2 AVMs actively oozing in mid stomach, s/p BICAP, path benign   . Esophagogastroduodenoscopy 01/18/2012    Rourk-Erosive reflux esophagitis. Noncritical  ring. Antral erosions/ small antral ulcer - appeared innocent  (H pylori serrology negative)    Family History  Problem Relation Age of Onset  . Colon cancer Neg Hx     History  Substance Use Topics  . Smoking status: Former Smoker    Quit date: 05/07/1969  . Smokeless tobacco: Current User    Types: Snuff  . Alcohol Use: 2.4 oz/week    4 Cans of beer per week     ETOH-pt states quit 12/2011      Review of Systems  Constitutional: Negative for fever and chills.  HENT: Negative for sore throat and neck pain.   Respiratory: Positive for shortness of breath and wheezing. Negative for cough.   Cardiovascular: Positive for chest pain.  Gastrointestinal: Negative for nausea, abdominal pain and diarrhea.  Skin: Negative for rash.  All other systems reviewed and are negative.    Allergies  Black pepper  Home Medications   Current Outpatient Rx  Name Route Sig Dispense Refill  . ALBUTEROL SULFATE HFA 108 (90 BASE) MCG/ACT IN AERS Inhalation Inhale 2 puffs into the lungs every 6 (six) hours as needed. For shortness of breath    . VAPOR INHALER IN Inhalation Inhale 1-2 puffs into the lungs as needed. For congestion    . BUDESONIDE-FORMOTEROL FUMARATE 160-4.5 MCG/ACT IN AERO Inhalation Inhale 2 puffs into the lungs 2 (two) times daily.    Marland Kitchen DIAZEPAM 5 MG  PO TABS Oral Take 5 mg by mouth 2 (two) times daily.    . ONE-A-DAY MENS 50+ ADVANTAGE PO Oral Take 1 tablet by mouth at bedtime.    Marland Kitchen SPIRIVA HANDIHALER 18 MCG IN CAPS Inhalation Place 18 mcg into inhaler and inhale daily.     . TRAMADOL HCL 50 MG PO TABS Oral Take 50 mg by mouth every 6 (six) hours as needed.       Triage Vitals: BP 126/79  Pulse 77  Temp(Src) 98 F (36.7 C) (Oral)  Resp 20  Wt 127 lb (57.607 kg)  SpO2 97%  Physical Exam  Nursing note and vitals reviewed. Constitutional: He is oriented to person, place, and time. He appears well-developed and well-nourished.  HENT:  Head: Normocephalic and atraumatic.   Eyes: Conjunctivae and EOM are normal. Pupils are equal, round, and reactive to light. No scleral icterus.  Neck: Normal range of motion. Neck supple.  Cardiovascular: Normal rate, regular rhythm and normal heart sounds.   No murmur heard. Pulmonary/Chest: Effort normal. No respiratory distress. He has wheezes (end expiratory wheezing, bilaterally).  Abdominal: Soft. He exhibits no distension. There is no tenderness.  Musculoskeletal: Normal range of motion. He exhibits no edema.  Neurological: He is alert and oriented to person, place, and time.  Skin: Skin is warm and dry.  Psychiatric: He has a normal mood and affect. His behavior is normal.    ED Course  Procedures (including critical care time)  DIAGNOSTIC STUDIES: Oxygen Saturation is 97% on room air, adequate by my interpretation.    COORDINATION OF CARE: 4:17PM - Patient understands and agrees with initial ED impression and plan with expectations set for ED visit.     Labs Reviewed - No data to display No results found.   No diagnosis found.   Date: 03/12/2012  Rate: 68  Rhythm: normal sinus rhythm  QRS Axis: normal  Intervals: normal  ST/T Wave abnormalities: normal  Conduction Disutrbances:none  Narrative Interpretation:   Old EKG Reviewed: unchanged    MDM  The ekg and labs look okay.  He will be discharged with an inhaler, follow up prn if he worsens.      I personally performed the services described in this documentation, which was scribed in my presence. The recorded information has been reviewed and considered.     [name] Lyons, MD 03/12/12 1728

## 2012-03-12 NOTE — Telephone Encounter (Signed)
Pt came by office, had walked part of the way, then his son picked him up and brought him the remainder of the way. He c/o weakness and wheezing. Said he had 1/2 of a headache, then said it was hurting just a little. He said he had first BM since procedure this AM and it was normal. No blood in stool. He said he has inhaler for wheezing and had used it shortly before he got here. After resting, he was not wheezing as much. Per Lorenza Burton, NP pt should go to the ED for wheezing since we do not treat it here. They can evaluate and see if he has other problems. Pt was informed.

## 2012-03-12 NOTE — ED Notes (Signed)
Sob since Friday, with chest pain

## 2012-03-12 NOTE — ED Notes (Signed)
Breathing treatment in progress

## 2012-03-12 NOTE — Discharge Instructions (Signed)
Shortness of Breath Shortness of breath (dyspnea) is the feeling of uneasy breathing. Shortness of breath does not always mean that there is a life-threatening illness. However, shortness of breath requires immediate medical care. CAUSES  Causes for shortness of breath include:  Not enough oxygen in the air (as with high altitudes or with a smoke-filled room).   Short-term (acute) lung disease, including:   Infections such as pneumonia.   Fluid in the lungs, such as heart failure.   A blood clot in the lungs (pulmonary embolism).   Lasting (chronic) lung diseases.   Heart disease (heart attack, angina, heart failure, and others).   Low red blood cells (anemia).   Poor physical fitness. This can cause shortness of breath when you exercise.   Chest or back injuries or stiffness.   Being overweight (obese).   Anxiety. This can make you feel like you are not getting enough air.  DIAGNOSIS  Serious medical problems can usually be found during your physical exam. Many tests may also be done to determine why you are having shortness of breath. Tests include:  Chest X-rays.   Lung function tests.   Blood tests.   Electrocardiography.   Exercise testing.   A cardiac echo.   Imaging scans.  Your caregiver may not be able to find a cause for your shortness of breath after your exam. In this case, it is important to have a follow-up exam with your caregiver as directed.  HOME CARE INSTRUCTIONS   Do not smoke. Smoking is a common cause of shortness of breath. Ask for help to stop smoking.   Avoid being around chemicals that may bother your breathing (paint fumes, dust).   Rest as needed. Slowly resume your usual activities.   If medicines were prescribed, take them as directed for the full length of time directed. This includes oxygen and any inhaled medicines.   Follow up with your caregiver as directed. Waiting to do so or failure to follow up could result in worsening of  your condition and possible disability or death.   Be sure you understand what to do or who to call if your shortness of breath worsens.  SEEK MEDICAL CARE IF:   Your condition does not improve in the time expected.   You have a hard time doing your normal activities even with rest.   You have any side effects or problems with the medicines prescribed.   You develop any new symptoms.  SEEK IMMEDIATE MEDICAL CARE IF:   Your shortness of breath is getting worse.   You feel lightheaded, faint, or develop a cough not controlled with medicines.   You start coughing up blood.   You have pain with breathing.   You have chest pain or pain in your arms, shoulders, or abdomen.   You have a fever.   You are unable to walk up stairs or exercise the way you normally do.   Your symptoms are getting worse.  Document Released: 07/12/2001 Document Revised: 10/06/2011 Document Reviewed: 02/27/2008 Exit[employer] Patient Information 2012 [employer]. 

## 2012-03-12 NOTE — ED Notes (Signed)
Inhaler given with instructions on use, patient is familiar with inhaler

## 2012-03-13 NOTE — Telephone Encounter (Signed)
REVIEWED.  

## 2012-03-19 ENCOUNTER — Encounter (HOSPITAL_COMMUNITY): Payer: Self-pay | Admitting: Gastroenterology

## 2012-03-20 ENCOUNTER — Telehealth: Payer: Self-pay | Admitting: Gastroenterology

## 2012-03-20 NOTE — Telephone Encounter (Signed)
Called and informed pt.  

## 2012-03-20 NOTE — Telephone Encounter (Signed)
Please call pt. HE had a simple adenoma removed from HIS colon. FOLLOW A High fiber diet. NO SOURCE FOR HIS LOW BLOOD COUNT WAS FOUND ON BIOPSY. HE NEEDS A CAPSULE ENDOSCOPY FOR OBSCURE GIB/TRANSFUSION DEPENDENT ANEMIA. TCS in 10-15 years IF THE BENEFITS OUTWEIGH THE RISKS. OPV IN 3 MOS W/ SLF E 30 VISIT.

## 2012-03-20 NOTE — Telephone Encounter (Signed)
Results Cc to PCP & reminders are in the computer 

## 2012-03-21 ENCOUNTER — Other Ambulatory Visit: Payer: Self-pay | Admitting: Gastroenterology

## 2012-03-21 ENCOUNTER — Encounter: Payer: Self-pay | Admitting: Gastroenterology

## 2012-03-21 NOTE — Telephone Encounter (Signed)
Pt is scheduled for GIVENS on 05/30- instructions mailed

## 2012-03-22 NOTE — Telephone Encounter (Signed)
Reminder in epic to follow up in 3 months with SF in E30 and to have TCS in 10-15 years

## 2012-03-28 ENCOUNTER — Telehealth: Payer: Self-pay | Admitting: Gastroenterology

## 2012-03-28 NOTE — Telephone Encounter (Signed)
PT CAME TO [facility] SHORT STAY. PT INSISTED THAT SOMEONE TOLD HIM TO COME. PT SEEN AROUND 1 PM. EXPLAINED TO PT THAT HE COULD NOT HAVE HIS PROCEDURE TODAY & THE REASON HE COULD NOT HAVE IT ON TODAY. HE HAD HIS WRITTEN INSTRUCTIONS WITH HIM. I READ HIS WRITTEN INSTRUCTIONS ALOUD AND RECOMMENDED THAT HE RETURN ON 5/30 AT 7 AM. HE STATED HE WOULD NOT BE COMING BACK TO [place]. HE WOULD GO TO [place] OR ELSEWHERE.  PT SHOULD BE DISCHARGED FROM THE PRACTICE PER HIS REQUEST. WE WILL CONTINUE TO CARE FOR HIM FOR 30 MORE DAYS.

## 2012-03-28 NOTE — OR Nursing (Signed)
On Friday, Mar 23, 2012 patient showed up at the ER stating that he had questions about his Given's capsule study. Patient was sent to Short Stay. Trenton Gammon, RN gave patient brochure and began to go over instructions for study with patient and patient stops her and states, "I'm not taking no pill that y'all are giving me." Patient stated, that he was here to get his three tubes of blood put back in his hand. Vidal Schwalbe RN asked myself and Irena Reichmann, Chiropodist to assist with the patient. We went to talk with the patient who proceeded to tell us that he wanted his blood put back in his hand. We explained that the blood was no longer available as tests had been run on it. I gave him the time that he was to be here on Thursday for his Given's and he said "I'm not taking nothing y'all give me." Patient admitted to drinking two "small" beers prior to coming to the hospital. Irena Reichmann arranged to have patient transported home by Onalee Hua in maintenance.

## 2012-03-29 ENCOUNTER — Encounter: Payer: Self-pay | Admitting: General Practice

## 2012-03-29 ENCOUNTER — Telehealth: Payer: Self-pay | Admitting: Gastroenterology

## 2012-03-29 ENCOUNTER — Encounter (HOSPITAL_COMMUNITY): Admission: RE | Payer: Self-pay | Source: Ambulatory Visit

## 2012-03-29 ENCOUNTER — Ambulatory Visit (HOSPITAL_COMMUNITY)
Admission: RE | Admit: 2012-03-29 | Payer: PRIVATE HEALTH INSURANCE | Source: Ambulatory Visit | Admitting: Gastroenterology

## 2012-03-29 SURGERY — IMAGING PROCEDURE, GI TRACT, INTRALUMINAL, VIA CAPSULE

## 2012-03-29 NOTE — Telephone Encounter (Signed)
Per [name] in Endo- Pt did not show up for his GIVENS today- she stated  He did show up in ENDO yesterday and last Friday and per ENDO staff he seemed to be under the influence of ETOH,- he was irate and demanding they give him his "blood back" - Endo staff tried to calm pt and explained to him his procedure was scheduled for 05/30 but pt stated he would not be coming back.

## 2012-03-29 NOTE — Telephone Encounter (Signed)
D/C letter mailed. 

## 2012-03-29 NOTE — Telephone Encounter (Signed)
Pt's family called and wanted to know who had called him. I looked up and told them that [facility] was trying to reschedule his GIVENS. He stated that he was up there yesterday and they did not know what they was talking about. I told him they he needed to be rescheduled in 30 days.

## 2012-03-29 NOTE — Telephone Encounter (Signed)
PT BEING DISCHARGED FROM THE PRACTICE. HE CAN CALL TO [facility] HIS PROCEDURE WITHIN THE NEXT 30 DAYS.

## 2012-04-01 ENCOUNTER — Emergency Department (HOSPITAL_COMMUNITY)
Admission: EM | Admit: 2012-04-01 | Discharge: 2012-04-01 | Disposition: A | Discharge status: Home / Self Care | Payer: PRIVATE HEALTH INSURANCE | Attending: Emergency Medicine | Admitting: Emergency Medicine

## 2012-04-01 ENCOUNTER — Encounter (HOSPITAL_COMMUNITY): Payer: Self-pay | Admitting: *Deleted

## 2012-04-01 ENCOUNTER — Emergency Department (HOSPITAL_COMMUNITY): Payer: PRIVATE HEALTH INSURANCE

## 2012-04-01 DIAGNOSIS — J449 Chronic obstructive pulmonary disease, unspecified: Secondary | ICD-10-CM | POA: Insufficient documentation

## 2012-04-01 DIAGNOSIS — R0602 Shortness of breath: Secondary | ICD-10-CM | POA: Insufficient documentation

## 2012-04-01 DIAGNOSIS — J4489 Other specified chronic obstructive pulmonary disease: Secondary | ICD-10-CM | POA: Insufficient documentation

## 2012-04-01 DIAGNOSIS — I10 Essential (primary) hypertension: Secondary | ICD-10-CM | POA: Insufficient documentation

## 2012-04-01 DIAGNOSIS — R0682 Tachypnea, not elsewhere classified: Secondary | ICD-10-CM | POA: Insufficient documentation

## 2012-04-01 DIAGNOSIS — R079 Chest pain, unspecified: Secondary | ICD-10-CM | POA: Insufficient documentation

## 2012-04-01 DIAGNOSIS — R5381 Other malaise: Secondary | ICD-10-CM | POA: Insufficient documentation

## 2012-04-01 DIAGNOSIS — Z79899 Other long term (current) drug therapy: Secondary | ICD-10-CM | POA: Insufficient documentation

## 2012-04-01 LAB — BASIC METABOLIC PANEL
BUN: 11 mg/dL (ref 6–23)
Chloride: 106 mEq/L (ref 96–112)
GFR calc Af Amer: >90 mL/min (ref >90)
Potassium: 3.5 mEq/L (ref 3.5–5.1)

## 2012-04-01 LAB — CBC
Platelets: 335 10*3/uL (ref 150–400)
RDW: 20.2 % — ABNORMAL HIGH (ref 11.5–15.5)
WBC: 4.1 10*3/uL (ref 4.0–10.5)

## 2012-04-01 MED ORDER — ALBUTEROL SULFATE HFA 108 (90 BASE) MCG/ACT IN AERS
2.0000 | INHALATION_SPRAY | RESPIRATORY_TRACT | Status: DC | PRN
  Administered 2012-04-01: 2 via RESPIRATORY_TRACT
  Filled 2012-04-01 (×2): qty 6.7

## 2012-04-01 MED ORDER — ALBUTEROL SULFATE (5 MG/ML) 0.5% IN NEBU
5.0000 mg | INHALATION_SOLUTION | Freq: Once | RESPIRATORY_TRACT | Status: AC
  Administered 2012-04-01: 5 mg via RESPIRATORY_TRACT
  Filled 2012-04-01: qty 1

## 2012-04-01 MED ORDER — IPRATROPIUM BROMIDE 0.02 % IN SOLN
0.5000 mg | Freq: Once | RESPIRATORY_TRACT | Status: AC
  Administered 2012-04-01: 0.5 mg via RESPIRATORY_TRACT
  Filled 2012-04-01: qty 2.5

## 2012-04-01 NOTE — Discharge Instructions (Signed)

## 2012-04-01 NOTE — ED Notes (Signed)
needs a refill on albuterol inhaler

## 2012-04-01 NOTE — Progress Notes (Signed)
REVIEWED.  

## 2012-04-01 NOTE — ED Notes (Signed)
Patient complaining of  rapid heartrate. Placed on monitor and o2 at 2lnc

## 2012-04-01 NOTE — ED Provider Notes (Signed)
History    This chart was scribed for [employer]. Rubin Payor, MD, MD by Smitty Pluck. The patient was seen in room APA04 and the patient's care was started at 9:26PM.   CSN: [id number]  Arrival date & time 04/01/12  2109   First MD Initiated Contact with Patient 04/01/12 2125      Chief Complaint  Patient presents with  . Shortness of Breath    (Consider location/radiation/quality/duration/timing/severity/associated sxs/prior treatment) Patient is a 78 y.o. male presenting with shortness of breath. The history is provided by the patient.  Shortness of Breath  Associated symptoms include shortness of breath. Pertinent negatives include no fever and no cough.   Lukasz Rogus is a 78 y.o. male who presents to the Emergency Department BIB EMS complaining of intermittent SOB that has been ongoing. Pt reports that he run out of his albuterol today. He denies cough and fever. Exertion aggravates the symptoms. Pt denies using oxygen at home. He states he had chest pain. Pt reports having breathing treatment via EMS with minor relief.   Past Medical History  Diagnosis Date  . COPD (chronic obstructive pulmonary disease)   . Anxiety   . Alcohol abuse   . Anemia   . Generalized headaches   . Hypertension   . 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 Pulmonary nodule/lesion, solitary   . Schizoaffective disorder   . GI bleed     History of recurrent bleeding that dates back as far as 2004 per records,    Past Surgical History  Procedure Date  . No past surgeries   . Colonoscopy Sept 2009    SLF: few small AVMs in ascending colon distal to IC . Ablated via BICAP.   Marland Kitchen Esophagogastroduodenoscopy Sept 2009    SLF: normal esophagus, small HH, 1-2 AVMs actively oozing in mid stomach, s/p BICAP, path benign   . Esophagogastroduodenoscopy 01/18/2012    Rourk-Erosive reflux esophagitis. Noncritical ring. Antral erosions/ small antral ulcer -  appeared innocent  (H pylori serrology negative)  . Colonoscopy 03/09/2012    Procedure: COLONOSCOPY;  Surgeon: West Bali, MD;  Location: [facility] SU[facility];  Service: Endoscopy;  Laterality: N/A;  2:30  . Esophagogastroduodenoscopy 03/09/2012    Procedure: ESOPHAGOGASTRODUODENOSCOPY (EGD);  Surgeon: West Bali, MD;  Location: [facility]O SUITE;  Service: Endoscopy;  Laterality: N/A;    Family History  Problem Relation Age of Onset  . Colon cancer Neg Hx     History  Substance Use Topics  . Smoking status: Former Smoker    Quit date: 05/07/1969  . Smokeless tobacco: Current User    Types: Snuff  . Alcohol Use: 2.4 oz/week    4 Cans of beer per week     ETOH-pt states quit 12/2011      Review of Systems  Constitutional: Negative for fever and chills.  Respiratory: Positive for shortness of breath. Negative for cough.   Gastrointestinal: Negative for nausea and vomiting.    Allergies  Black pepper  Home Medications   Current Outpatient Rx  Name Route Sig Dispense Refill  . ALBUTEROL SULFATE HFA 108 (90 BASE) MCG/ACT IN AERS Inhalation Inhale 2 puffs into the lungs every 6 (six) hours as needed. For shortness of breath    . DIAZEPAM 5 MG PO TABS Oral Take 5 mg by mouth 2 (two) times daily.      BP 141/90  Pulse 81  Temp(Src) 98 F (36.7 C) (Oral)  Resp 18  SpO2 90%  Physical Exam  Constitutional: He is oriented to person, place, and time. He appears well-developed and well-nourished. No distress.  HENT:  Head: Normocephalic and atraumatic.  Eyes: Conjunctivae are normal. Pupils are equal, round, and reactive to light.  Neck: Normal range of motion. Neck supple.  Pulmonary/Chest: Tachypnea noted. He has wheezes.       Prolonged throughout   Abdominal: Soft. He exhibits no distension.  Neurological: He is alert and oriented to person, place, and time.  Skin: Skin is warm and dry.  Psychiatric: He has a normal mood and affect. His behavior is normal.    ED Course   Procedures (including critical care time) DIAGNOSTIC STUDIES: Oxygen Saturation is 92% on room air, normal by my interpretation.    COORDINATION OF CARE: 9:30PM EDP discusses pt ED treatment with pt  9:30PM EDP ordered medication: Albuterol 0.5% 5 mg, Atrovent 0.5 mg,    Labs Reviewed  CBC - Abnormal; Notable for the following:    RBC 4.01 (*)    Hemoglobin 10.5 (*)    HCT 33.6 (*)    RDW 20.2 (*)    All other components within normal limits  BASIC METABOLIC PANEL - Abnormal; Notable for the following:    Glucose, Bld 102 (*)    GFR calc non Af Amer 81 (*)    All other components within normal limits  TROPONIN I   Dg Chest 2 View  04/01/2012  *RADIOLOGY REPORT*  Clinical Data: Shortness of breath and weakness.  CHEST - 2 VIEW  Comparison: Chest x-ray 03/12/2012.  Findings: Lung volumes are normal.  No consolidative airspace disease.  No pleural effusions.  Mild prominence of the interstitial markings is similar to multiple prior examinations and appears to be chronic in this patient.  Mild peribronchial cuffing is noted, particularly centrally.  Pulmonary vasculature and the cardiomediastinal silhouette are within normal limits. Atherosclerotic calcifications within the thoracic aorta.  IMPRESSION: 1.  No radiographic evidence of acute cardiopulmonary disease. 2.  Chronic lung changes suggestive of chronic bronchitis are again noted. 3.  Atherosclerosis.  Original Report Authenticated By: Florencia Reasons, M.D.     1. COPD (chronic obstructive pulmonary disease)   2. Chest pain     Date: 04/01/2012  Rate: 102  Rhythm: sinus tachycardia  QRS Axis: normal  Intervals: normal  ST/T Wave abnormalities: nonspecific ST/T changes  Conduction Disutrbances:none  Narrative Interpretation:   Old EKG Reviewed: changes noted    MDM  Patient presented stating that he just needed a refill his albuterol inhaler. He is a history of COPD. However on examination he was found to be hypoxic.  He was given a breathing treatment and then developed some chest pressure. At that point he allowed an EKG be done along with some more blood work. X-ray did not show pneumonia. EKG shows some nonspecific ST changes. Patient felt better after the breathing treatment would no longer stay for further evaluation. He was discharged home AGAINST MEDICAL ADVICE given inhaler. He appear to have capacity to make the decision. I personally performed the services described in this documentation, which was scribed in my presence. The recorded information has been reviewed and considered.          Juliet Rude. Rubin Payor, MD 04/01/12 [phone]

## 2012-04-01 NOTE — Progress Notes (Signed)
Pt comes in all the time can't breath wants new inhaler, has no money , gets inhaler is satisfied , last inhaler given by hospital , he uses it till it runs out , returns for refill. He free loads off system. He is chronic need social placement. He argues with staff till he gets what he wants.

## 2012-04-04 ENCOUNTER — Encounter (HOSPITAL_COMMUNITY): Payer: Self-pay | Admitting: Oncology

## 2012-04-04 ENCOUNTER — Emergency Department (HOSPITAL_COMMUNITY)
Admission: EM | Admit: 2012-04-04 | Discharge: 2012-04-04 | Disposition: A | Discharge status: Home / Self Care | Payer: PRIVATE HEALTH INSURANCE | Attending: Emergency Medicine | Admitting: Emergency Medicine

## 2012-04-04 DIAGNOSIS — J449 Chronic obstructive pulmonary disease, unspecified: Secondary | ICD-10-CM | POA: Insufficient documentation

## 2012-04-04 DIAGNOSIS — M545 Low back pain, unspecified: Secondary | ICD-10-CM | POA: Insufficient documentation

## 2012-04-04 DIAGNOSIS — R000 Tachycardia, unspecified: Secondary | ICD-10-CM | POA: Insufficient documentation

## 2012-04-04 DIAGNOSIS — D509 Iron deficiency anemia, unspecified: Secondary | ICD-10-CM | POA: Insufficient documentation

## 2012-04-04 DIAGNOSIS — J4489 Other specified chronic obstructive pulmonary disease: Secondary | ICD-10-CM | POA: Insufficient documentation

## 2012-04-04 LAB — BASIC METABOLIC PANEL
BUN: 13 mg/dL (ref 6–23)
CO2: 27 mEq/L (ref 19–32)
Chloride: 106 mEq/L (ref 96–112)
Glucose, Bld: 91 mg/dL (ref 70–99)
Potassium: 3.9 mEq/L (ref 3.5–5.1)

## 2012-04-04 LAB — CBC
Hemoglobin: 10.6 g/dL — ABNORMAL LOW (ref 13.0–17.0)
MCH: 26.4 pg (ref 26.0–34.0)
RBC: 4.02 MIL/uL — ABNORMAL LOW (ref 4.22–5.81)

## 2012-04-04 LAB — DIFFERENTIAL
Eosinophils Absolute: 0.3 10*3/uL (ref 0.0–0.7)
Lymphs Abs: 0.7 10*3/uL (ref 0.7–4.0)
Monocytes Relative: 18 % — ABNORMAL HIGH (ref 3–12)
Neutro Abs: 1.6 10*3/uL — ABNORMAL LOW (ref 1.7–7.7)
Neutrophils Relative %: 52 % (ref 43–77)

## 2012-04-04 MED ORDER — FERROUS SULFATE 325 (65 FE) MG PO TABS: 325.0000 mg | ORAL_TABLET | Freq: Three times a day (TID) | ORAL | Status: DC

## 2012-04-04 NOTE — ED Notes (Signed)
Placed on cardiac monitor.  EKG completed by Rhae Lerner.

## 2012-04-04 NOTE — ED Provider Notes (Addendum)
History    This chart was scribed for [name] Human, MD, MD by Smitty Pluck. The patient was seen in room [facility]19 and the patient's care was started at 3:04PM.   CSN: [id number]  Arrival date & time 04/04/12  1225   First MD Initiated Contact with Patient 04/04/12 1500      Chief Complaint  Patient presents with  . Tachycardia    (Consider location/radiation/quality/duration/timing/severity/associated sxs/prior treatment) The history is provided by the patient.   [name] is a 78 y.o. male who presents to the Emergency Department complaining of low blood iron. Pt reports moderate lower back pain. Pt reports that he took asa when his symptoms began without relief. Symptom has been constant since onset without radiation. Denies any other pain.  The patient is a man who comes to the ED frequently.  His main physical problem is COPD.  He occasionally gets bored and angrily leaves the ED.  This happened when I saw him in March 2013.  Past Medical History  Diagnosis Date  . COPD (chronic obstructive pulmonary disease)   . Anxiety   . Alcohol abuse   . Anemia   . Generalized headaches   . Hypertension   . 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 Pulmonary nodule/lesion, solitary   . Schizoaffective disorder   . GI bleed     History of recurrent bleeding that dates back as far as 2004 per records,    Past Surgical History  Procedure Date  . No past surgeries   . Colonoscopy Sept 2009    SLF: few small AVMs in ascending colon distal to IC . Ablated via BICAP.   Marland Kitchen Esophagogastroduodenoscopy Sept 2009    SLF: normal esophagus, small HH, 1-2 AVMs actively oozing in mid stomach, s/p BICAP, path benign   . Esophagogastroduodenoscopy 01/18/2012    Rourk-Erosive reflux esophagitis. Noncritical ring. Antral erosions/ small antral ulcer - appeared innocent  (H pylori serrology negative)  . Colonoscopy 03/09/2012    Procedure:  COLONOSCOPY;  Surgeon: West Bali, MD;  Location: [facility];  Service: Endoscopy;  Laterality: N/A;  2:30  . Esophagogastroduodenoscopy 03/09/2012    Procedure: ESOPHAGOGASTRODUODENOSCOPY (EGD);  Surgeon: West Bali, MD;  Location: [facility];  Service: Endoscopy;  Laterality: N/A;    Family History  Problem Relation Age of Onset  . Colon cancer Neg Hx     History  Substance Use Topics  . Smoking status: Former Smoker    Quit date: 05/07/1969  . Smokeless tobacco: Current User    Types: Snuff  . Alcohol Use: 2.4 oz/week    4 Cans of beer per week     ETOH-pt states quit 12/2011      Review of Systems  All other systems reviewed and are negative.  10 Systems reviewed and all are negative for acute change except as noted in the HPI.    Allergies  Black pepper  Home Medications   Current Outpatient Rx  Name Route Sig Dispense Refill  . ALBUTEROL SULFATE HFA 108 (90 BASE) MCG/ACT IN AERS Inhalation Inhale 2 puffs into the lungs every 6 (six) hours as needed. For shortness of breath    . ASPIRIN 325 MG PO TABS Oral Take 325 mg by mouth every 6 (six) hours as needed. For pain    . ASPIRIN EC 81 MG PO TBEC Oral Take 81 mg by mouth daily.    Marland Kitchen  DIAZEPAM 5 MG PO TABS Oral Take 5 mg by mouth 2 (two) times daily.      BP 99/65  Pulse 86  Temp(Src) 97 F (36.1 C) (Oral)  Resp 18  Wt 124 lb 6.4 oz (56.427 kg)  SpO2 91%  Physical Exam  Nursing note and vitals reviewed. Constitutional: He is oriented to person, place, and time. He appears well-developed and well-nourished. No distress.  HENT:  Head: Normocephalic and atraumatic.  Eyes: Conjunctivae are normal. Pupils are equal, round, and reactive to light.  Neck: Normal range of motion. Neck supple.  Cardiovascular: Normal rate, regular rhythm and normal heart sounds.   Pulmonary/Chest: Effort normal and breath sounds normal.  Abdominal: Soft. He exhibits no distension. There is no tenderness. There is no  rebound and no guarding.  Musculoskeletal:       No bony deformity or tenderness over his back.  No peripheral edema.  Neurological: He is alert and oriented to person, place, and time.  Skin: Skin is warm and dry.  Psychiatric: He has a normal mood and affect. His behavior is normal.       He again said he was tired of waiting, was going to get dressed and leave the ED.    ED Course  Procedures (including critical care time) DIAGNOSTIC STUDIES: Oxygen Saturation is 91% on room air, adequate by my interpretation.    COORDINATION OF CARE: 3:09 EDP discusses pt ED treatment with pt. Pt is ready for discharge. Prescription for iron pills.    Labs Reviewed  CBC - Abnormal; Notable for the following:    WBC 3.1 (*)    RBC 4.02 (*)    Hemoglobin 10.6 (*)    HCT 33.6 (*)    RDW 20.4 (*)    All other components within normal limits  DIFFERENTIAL - Abnormal; Notable for the following:    Neutro Abs 1.6 (*)    Monocytes Relative 18 (*)    Eosinophils Relative 8 (*)    All other components within normal limits  BASIC METABOLIC PANEL - Abnormal; Notable for the following:    GFR calc non Af Amer 82 (*)    All other components within normal limits  TROPONIN I    Date: 04/04/2012  Rate:78  Rhythm: normal sinus rhythm  QRS Axis: left  Intervals: normal  ST/T Wave abnormalities: normal  Conduction Disutrbances:none  Narrative Interpretation: Normal EKG  Old EKG Reviewed: changes noted--ST/T wave changes seen in inferior leads on prior tracing are no longer present.      1. Iron deficiency anemia      Pt was seen and physical exam was performed.  CBC showed him to be mildly anemic.  FeSO4 325 mg tid was prescribed.  I personally performed the services described in this documentation, which was scribed in my presence. The recorded information has been reviewed and considered.  [name], M.D.   [name] Cooper III, MD 04/04/12 1649    [name] Cooper III,  MD 05/02/12 [phone]

## 2012-04-04 NOTE — Discharge Instructions (Signed)

## 2012-04-04 NOTE — ED Notes (Signed)
Pt c/o wait; states, "I'm getting about tired of sitting here".  Explained to pt that EDP would be in with him as soon as possible.  Offered comfort measures, repositioning.  Pt agreeable to wait for EDP.

## 2012-04-04 NOTE — ED Notes (Signed)
Discharge instructions reviewed and f/u information provided. Verbalizes understanding of instructions given.

## 2012-04-04 NOTE — ED Notes (Signed)
[name] presents to the ER w/ c/o rapid heart rate, neck pain, ShOB x 2.5 hrs.  States he took ASA when his symptoms began.

## 2012-04-12 ENCOUNTER — Emergency Department (HOSPITAL_COMMUNITY)
Admission: EM | Admit: 2012-04-12 | Discharge: 2012-04-12 | Disposition: A | Discharge status: Home / Self Care | Payer: PRIVATE HEALTH INSURANCE | Attending: Emergency Medicine | Admitting: Emergency Medicine

## 2012-04-12 ENCOUNTER — Encounter (HOSPITAL_COMMUNITY): Payer: Self-pay

## 2012-04-12 DIAGNOSIS — W57XXXA Bitten or stung by nonvenomous insect and other nonvenomous arthropods, initial encounter: Secondary | ICD-10-CM | POA: Insufficient documentation

## 2012-04-12 DIAGNOSIS — T148 Other injury of unspecified body region: Secondary | ICD-10-CM | POA: Insufficient documentation

## 2012-04-12 DIAGNOSIS — F259 Schizoaffective disorder, unspecified: Secondary | ICD-10-CM | POA: Insufficient documentation

## 2012-04-12 DIAGNOSIS — M542 Cervicalgia: Secondary | ICD-10-CM

## 2012-04-12 DIAGNOSIS — Z87891 Personal history of nicotine dependence: Secondary | ICD-10-CM | POA: Insufficient documentation

## 2012-04-12 DIAGNOSIS — J4489 Other specified chronic obstructive pulmonary disease: Secondary | ICD-10-CM | POA: Insufficient documentation

## 2012-04-12 DIAGNOSIS — I10 Essential (primary) hypertension: Secondary | ICD-10-CM | POA: Insufficient documentation

## 2012-04-12 DIAGNOSIS — J449 Chronic obstructive pulmonary disease, unspecified: Secondary | ICD-10-CM | POA: Insufficient documentation

## 2012-04-12 DIAGNOSIS — D649 Anemia, unspecified: Secondary | ICD-10-CM | POA: Insufficient documentation

## 2012-04-12 MED ORDER — MUPIROCIN CALCIUM 2 % EX CREA: TOPICAL_CREAM | Freq: Three times a day (TID) | CUTANEOUS | Status: AC

## 2012-04-12 MED ORDER — HYDROCODONE-ACETAMINOPHEN 5-325 MG PO TABS: 1.0000 | ORAL_TABLET | Freq: Four times a day (QID) | ORAL | Status: AC | PRN

## 2012-04-12 MED ORDER — ALBUTEROL SULFATE HFA 108 (90 BASE) MCG/ACT IN AERS
2.0000 | INHALATION_SPRAY | RESPIRATORY_TRACT | Status: DC
  Administered 2012-04-12: 2 via RESPIRATORY_TRACT
  Filled 2012-04-12: qty 6.7

## 2012-04-12 NOTE — Discharge Instructions (Signed)
Please apply bactroban to the bite area of your scalp three times daily. Use tylenol for mild neck soreness. Use norco for more severe neck pain. This medication may cause drowsiness and constipation, use with caution. Albuterol every 4 hours to assist with breathing.Insect Bite Mosquitoes, flies, fleas, bedbugs, and other insects can bite. Insect bites are different from insect stings. The bite may be red, puffy (swollen), and itchy for 2 to 4 days. Most bites get better on their own. HOME CARE   Do not scratch the bite.   Keep the bite clean and dry. Wash the bite with soap and water.   Put ice on the bite.   Put ice in a plastic bag.   Place a towel between your skin and the bag.   Leave the ice on for 20 minutes, 4 times a day. Do this for the first 2 to 3 days, or as told by your doctor.   You may use medicated lotions or creams to lessen itching as told by your doctor.   Only take medicines as told by your doctor.   If you are given medicines (antibiotics), take them as told. Finish them even if you start to feel better.  You may need a tetanus shot if:  You cannot remember when you had your last tetanus shot.   You have never had a tetanus shot.   The injury broke your skin.  If you need a tetanus shot and you choose not to have one, you may get tetanus. Sickness from tetanus can be serious. GET HELP RIGHT AWAY IF:   You have more pain, redness, or puffiness.   You see a red line on the skin coming from the bite.   You have a fever.   You have joint pain.   You have a headache or neck pain.   You feel weak.   You have a rash.   You have chest pain, or you are short of breath.   You have belly (abdominal) pain.   You feel sick to your stomach (nauseous) or throw up (vomit).   You feel very tired or sleepy.  MAKE SURE YOU:   Understand these instructions.   Will watch your condition.   Will get help right away if you are not doing well or get worse.    Document Released: 10/14/2000 Document Revised: 10/06/2011 Document Reviewed: 05/18/2011 [facility] Patient Information 2012 [place], [place].

## 2012-04-12 NOTE — ED Notes (Signed)
C/o bug bite to top of head and neck pain that began this am.

## 2012-04-12 NOTE — ED Notes (Signed)
Pt has abrasion to the left top of his head. No drainage. Pt also c/o neck pain but denies injury. Alert and oriented x 3. Skin warm and dry. Color pink. Ambulates with no assistance.

## 2012-04-12 NOTE — ED Provider Notes (Signed)
History     CSN: [id number]  Arrival date & time 04/12/12  1017   First MD Initiated Contact with Patient 04/12/12 1036      Chief Complaint  Patient presents with  . Insect Bite  . Neck Pain    (Consider location/radiation/quality/duration/timing/severity/associated sxs/prior treatment) Patient is a 78 y.o. male presenting with neck pain. The history is provided by the patient.  Neck Pain  This is a new problem. The current episode started 6 to 12 hours ago. The problem occurs constantly. The problem has not changed since onset.The pain is associated with nothing. Pain location: frontal scalp. The quality of the pain is described as aching and burning. Radiates to: neck. The pain is moderate. Exacerbated by: nothing. The pain is the same all the time. Pertinent negatives include no photophobia, no chest pain, no numbness, no headaches, no bowel incontinence and no bladder incontinence. He has tried nothing for the symptoms.    Past Medical History  Diagnosis Date  . COPD (chronic obstructive pulmonary disease)   . Anxiety   . Alcohol abuse   . Anemia   . Generalized headaches   . Hypertension   . 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 Pulmonary nodule/lesion, solitary   . Schizoaffective disorder   . GI bleed     History of recurrent bleeding that dates back as far as 2004 per records,    Past Surgical History  Procedure Date  . No past surgeries   . Colonoscopy Sept 2009    SLF: few small AVMs in ascending colon distal to IC . Ablated via BICAP.   Marland Kitchen Esophagogastroduodenoscopy Sept 2009    SLF: normal esophagus, small HH, 1-2 AVMs actively oozing in mid stomach, s/p BICAP, path benign   . Esophagogastroduodenoscopy 01/18/2012    Rourk-Erosive reflux esophagitis. Noncritical ring. Antral erosions/ small antral ulcer - appeared innocent  (H pylori serrology negative)  . Colonoscopy 03/09/2012    Procedure:  COLONOSCOPY;  Surgeon: West Bali, MD;  Location: [facility];  Service: Endoscopy;  Laterality: N/A;  2:30  . Esophagogastroduodenoscopy 03/09/2012    Procedure: ESOPHAGOGASTRODUODENOSCOPY (EGD);  Surgeon: West Bali, MD;  [facility]: [facility];  Service: Endoscopy;  Laterality: N/A;    Family History  Problem Relation Age of Onset  . Colon cancer Neg Hx     History  Substance Use Topics  . Smoking status: Former Smoker    Quit date: 05/07/1969  . Smokeless tobacco: Current User    Types: Snuff  . Alcohol Use: 2.4 oz/week    4 Cans of beer per week     ETOH-pt states quit 12/2011      Review of Systems  Constitutional: Negative for activity change.       All ROS Neg except as noted in HPI  HENT: Positive for neck pain. Negative for nosebleeds.   Eyes: Negative for photophobia and discharge.  Respiratory: Positive for shortness of breath and wheezing. Negative for cough.   Cardiovascular: Negative for chest pain and palpitations.  Gastrointestinal: Negative for abdominal pain, blood in stool and bowel incontinence.  Genitourinary: Negative for bladder incontinence, dysuria, frequency and hematuria.  Musculoskeletal: Negative for back pain and arthralgias.  Skin: Negative.        itching  Neurological: Negative for dizziness, seizures, speech difficulty, numbness and headaches.  Psychiatric/Behavioral: Negative for hallucinations and confusion. The patient is nervous/anxious.  Allergies  Black pepper  Home Medications   Current Outpatient Rx  Name Route Sig Dispense Refill  . ALBUTEROL SULFATE HFA 108 (90 BASE) MCG/ACT IN AERS Inhalation Inhale 2 puffs into the lungs every 6 (six) hours as needed. For shortness of breath    . ASPIRIN 325 MG PO TABS Oral Take 325 mg by mouth every 6 (six) hours as needed. For pain    . DIAZEPAM 5 MG PO TABS Oral Take 5 mg by mouth 2 (two) times daily.    Marland Kitchen FERROUS SULFATE 325 (65 FE) MG PO TABS Oral Take 1 tablet (325 mg  total) by mouth 3 (three) times daily with meals. 100 tablet 0  . TIOTROPIUM BROMIDE MONOHYDRATE 18 MCG IN CAPS Inhalation Place 18 mcg into inhaler and inhale daily.    Marland Kitchen HYDROCODONE-ACETAMINOPHEN 5-325 MG PO TABS Oral Take 1 tablet by mouth every 6 (six) hours as needed for pain. 15 tablet 0  . MUPIROCIN CALCIUM 2 % EX CREA Topical Apply topically 3 (three) times daily. 15 g 0    BP 96/56  Pulse 91  Temp 97.7 F (36.5 C) (Oral)  Resp 18  SpO2 94%  Physical Exam  Nursing note and vitals reviewed. Constitutional: He is oriented to person, place, and time. He appears well-developed and well-nourished.  Non-toxic appearance.  HENT:  Head: Normocephalic.  Right Ear: Tympanic membrane and external ear normal.  Left Ear: Tympanic membrane and external ear normal.       Small area of induration of the left frontal scalp area. No drainage. No red streaking. Not hot.  Eyes: EOM and lids are normal. Pupils are equal, round, and reactive to light.  Neck: Normal range of motion. Carotid bruit is not present.       Soreness with ROM of the c spine. No palpable deformity.   Cardiovascular: Normal rate, regular rhythm, normal heart sounds, intact distal pulses and normal pulses.   Pulmonary/Chest: No respiratory distress. He has wheezes.  Abdominal: Soft. Bowel sounds are normal. There is no tenderness. There is no guarding.  Musculoskeletal: Normal range of motion.  Lymphadenopathy:       Head (right side): No submandibular adenopathy present.       Head (left side): No submandibular adenopathy present.    He has no cervical adenopathy.  Neurological: He is alert and oriented to person, place, and time. He has normal strength. No cranial nerve deficit or sensory deficit.  Skin: Skin is warm and dry.  Psychiatric: He has a normal mood and affect. His speech is normal.    ED Course  Procedures (including critical care time)  Labs Reviewed - No data to display No results found.   1.  Insect bite   2. COPD (chronic obstructive pulmonary disease)   3. Neck pain       MDM  I have reviewed nursing notes, vital signs, and all appropriate lab and imaging results for this patient. Pt sustained an insect bite of the left scalp. Will treat with bactroban. He has soreness of the neck, will treat with tylenol and norco. He has COPD, has mild to mod wheezes. Albuterol inhaler given. Pt to see his PCP for followup.       Kathie Dike, [place] 04/12/12 1125

## 2012-04-12 NOTE — ED Provider Notes (Signed)
Medical screening examination/treatment/procedure(s) were conducted as a shared visit with non-physician practitioner(s) and myself.  I personally evaluated the patient during the encounter   [name], MD 04/12/12 1457

## 2012-04-16 ENCOUNTER — Emergency Department (HOSPITAL_COMMUNITY)
Admission: EM | Admit: 2012-04-16 | Discharge: 2012-04-16 | Discharge status: Against Medical Advice | Payer: PRIVATE HEALTH INSURANCE | Attending: Emergency Medicine | Admitting: Emergency Medicine

## 2012-04-16 ENCOUNTER — Encounter (HOSPITAL_COMMUNITY): Payer: Self-pay | Admitting: *Deleted

## 2012-04-16 DIAGNOSIS — R0602 Shortness of breath: Secondary | ICD-10-CM | POA: Insufficient documentation

## 2012-04-16 DIAGNOSIS — J45909 Unspecified asthma, uncomplicated: Secondary | ICD-10-CM

## 2012-04-16 DIAGNOSIS — R0789 Other chest pain: Secondary | ICD-10-CM | POA: Insufficient documentation

## 2012-04-16 MED ORDER — ALBUTEROL SULFATE HFA 108 (90 BASE) MCG/ACT IN AERS
2.0000 | INHALATION_SPRAY | Freq: Once | RESPIRATORY_TRACT | Status: AC
  Administered 2012-04-16: 2 via RESPIRATORY_TRACT
  Filled 2012-04-16: qty 6.7

## 2012-04-16 NOTE — ED Provider Notes (Signed)
History   This chart was scribed for Donnetta Hutching, MD by [name]. The patient was seen in room [facility]10/[facility]10. Patient's care was started at 1716.  CSN: [id number]  Arrival date & time 04/16/12  1716   First MD Initiated Contact with Patient 04/16/12 1838      Chief Complaint  Patient presents with  . Shortness of Breath   HPI  [name] is a 78 y.o. male who presents to the Emergency Department complaining of sudden onset mild severe constant SOB described as wheezing. Pt began feeling wheezy this morning and was out of his albuteral inhaler, and came to the emergency department because he needed a breathing treatment, denying coughing or choking. Pt has h/o Pulmonary nodule.   Past Medical History  Diagnosis Date  . COPD (chronic obstructive pulmonary disease)   . Anxiety   . Alcohol abuse   . Anemia   . Generalized headaches   . Hypertension   . 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 Pulmonary nodule/lesion, solitary   . Schizoaffective disorder   . GI bleed     History of recurrent bleeding that dates back as far as 2004 per records,    Past Surgical History  Procedure Date  . No past surgeries   . Colonoscopy Sept 2009    SLF: few small AVMs in ascending colon distal to IC . Ablated via BICAP.   Marland Kitchen Esophagogastroduodenoscopy Sept 2009    SLF: normal esophagus, small HH, 1-2 AVMs actively oozing in mid stomach, s/p BICAP, path benign   . Esophagogastroduodenoscopy 01/18/2012    Rourk-Erosive reflux esophagitis. Noncritical ring. Antral erosions/ small antral ulcer - appeared innocent  (H pylori serrology negative)  . Colonoscopy 03/09/2012    Procedure: COLONOSCOPY;  Surgeon: West Bali, MD;  [facility];  Service: Endoscopy;  Laterality: N/A;  2:30  . Esophagogastroduodenoscopy 03/09/2012    Procedure: ESOPHAGOGASTRODUODENOSCOPY (EGD);  Surgeon: West Bali, MD;  [facility];   Service: Endoscopy;  Laterality: N/A;    Family History  Problem Relation Age of Onset  . Colon cancer Neg Hx     History  Substance Use Topics  . Smoking status: Former Smoker    Quit date: 05/07/1969  . Smokeless tobacco: Current User    Types: Snuff  . Alcohol Use: 2.4 oz/week    4 Cans of beer per week     ETOH-pt states quit 12/2011    Review of Systems  Constitutional: Negative for fever and chills.  Respiratory: Positive for shortness of breath and wheezing. Negative for cough and choking.   Gastrointestinal: Negative for nausea and vomiting.  Neurological: Negative for weakness.    Allergies  Black pepper  Home Medications   Current Outpatient Rx  Name Route Sig Dispense Refill  . ALBUTEROL SULFATE HFA 108 (90 BASE) MCG/ACT IN AERS Inhalation Inhale 2 puffs into the lungs every 6 (six) hours as needed. For shortness of breath    . ASPIRIN 325 MG PO TABS Oral Take 325 mg by mouth every 6 (six) hours as needed. For pain    . DIAZEPAM 5 MG PO TABS Oral Take 5 mg by mouth 2 (two) times daily.    Marland Kitchen FERROUS SULFATE 325 (65 FE) MG PO TABS Oral Take 1 tablet (325 mg total) by mouth 3 (three) times daily with meals. 100 tablet 0  . HYDROCODONE-ACETAMINOPHEN 5-325 MG  PO TABS Oral Take 1 tablet by mouth every 6 (six) hours as needed for pain. 15 tablet 0  . MUPIROCIN CALCIUM 2 % EX CREA Topical Apply topically 3 (three) times daily. 15 g 0  . TIOTROPIUM BROMIDE MONOHYDRATE 18 MCG IN CAPS Inhalation Place 18 mcg into inhaler and inhale daily.      BP 109/59  Pulse 95  Temp 97.6 F (36.4 C) (Oral)  Resp 22  Ht 5\' 4"  (1.626 m)  Wt 124 lb (56.246 kg)  BMI 21.28 kg/m2  Physical Exam  Nursing note and vitals reviewed. Constitutional: He is oriented to person, place, and time. He appears well-developed and well-nourished. No distress.  HENT:  Head: Normocephalic and atraumatic.  Eyes: EOM are normal. Pupils are equal, round, and reactive to light.  Neck: Neck supple. No  tracheal deviation present.  Cardiovascular: Normal rate.   Pulmonary/Chest: Effort normal. No respiratory distress.       Slight wheeze.  Abdominal: Soft. He exhibits no distension.  Musculoskeletal: Normal range of motion. He exhibits no edema.  Neurological: He is alert and oriented to person, place, and time. No sensory deficit.  Skin: Skin is warm and dry.  Psychiatric: He has a normal mood and affect. His behavior is normal.    ED Course  Procedures (including critical care time) COORDINATION OF CARE: 1900- Evaluated Pt. Pt will receive inhaler treatment and be discharged.   Labs Reviewed - No data to display No results found.   No diagnosis found.    MDM  Patient is a known asthmatic.  Ran out of albuterol inhaler..  No acute distress at discharge.  Patient has no airway compromise at discharge   I personally performed the services described in this documentation, which was scribed in my presence. The recorded information has been reviewed and considered.      Donnetta Hutching, MD 04/25/12 1924

## 2012-04-16 NOTE — ED Notes (Signed)
Sob since this am. Alert, wheeze, says he is about "OUt of  My inhaler."

## 2012-04-19 ENCOUNTER — Emergency Department (HOSPITAL_COMMUNITY): Payer: PRIVATE HEALTH INSURANCE

## 2012-04-19 ENCOUNTER — Emergency Department (HOSPITAL_COMMUNITY)
Admission: EM | Admit: 2012-04-19 | Discharge: 2012-04-19 | Discharge status: Against Medical Advice | Payer: PRIVATE HEALTH INSURANCE | Attending: Emergency Medicine | Admitting: Emergency Medicine

## 2012-04-19 DIAGNOSIS — D649 Anemia, unspecified: Secondary | ICD-10-CM | POA: Insufficient documentation

## 2012-04-19 DIAGNOSIS — F411 Generalized anxiety disorder: Secondary | ICD-10-CM | POA: Insufficient documentation

## 2012-04-19 DIAGNOSIS — J449 Chronic obstructive pulmonary disease, unspecified: Secondary | ICD-10-CM | POA: Insufficient documentation

## 2012-04-19 DIAGNOSIS — F259 Schizoaffective disorder, unspecified: Secondary | ICD-10-CM | POA: Insufficient documentation

## 2012-04-19 DIAGNOSIS — F17200 Nicotine dependence, unspecified, uncomplicated: Secondary | ICD-10-CM | POA: Insufficient documentation

## 2012-04-19 DIAGNOSIS — J4489 Other specified chronic obstructive pulmonary disease: Secondary | ICD-10-CM | POA: Insufficient documentation

## 2012-04-19 DIAGNOSIS — R0602 Shortness of breath: Secondary | ICD-10-CM

## 2012-04-19 DIAGNOSIS — I10 Essential (primary) hypertension: Secondary | ICD-10-CM | POA: Insufficient documentation

## 2012-04-19 MED ORDER — ALBUTEROL SULFATE (5 MG/ML) 0.5% IN NEBU: 2.5000 mg | INHALATION_SOLUTION | Freq: Once | RESPIRATORY_TRACT | Status: DC

## 2012-04-19 MED ORDER — IPRATROPIUM BROMIDE 0.02 % IN SOLN: 0.5000 mg | Freq: Once | RESPIRATORY_TRACT | Status: DC

## 2012-04-19 MED ORDER — PREDNISONE 20 MG PO TABS: 60.0000 mg | ORAL_TABLET | Freq: Once | ORAL | Status: DC

## 2012-04-19 NOTE — ED Notes (Signed)
Pt now threatening edp states "you're messing with the wrong guy that will give you a black eye"the patient asking to be taken off monitor to leave.

## 2012-04-19 NOTE — ED Notes (Signed)
Pt c/o sob since this am.  

## 2012-04-19 NOTE — ED Notes (Signed)
Pt states "you ain't taken no damn blood from me every time I come up here"

## 2012-04-19 NOTE — ED Provider Notes (Signed)
History     CSN: [id number]  Arrival date & time 04/19/12  1918   First MD Initiated Contact with Patient 04/19/12 1941      Chief Complaint  Patient presents with  . Shortness of Breath    (Consider location/radiation/quality/duration/timing/severity/associated sxs/prior treatment) HPI Comments: Patient presents with shortness of breath for the past one day. Has a history of COPD, not on oxygen at home. He denies any chest pain, fever. Dry nonproductive cough. Is not smoke for 40 years. He also has a history of alcohol abuse, anemia, AV malformations of the GI tract. His multiple recent visits and specifically requests albuterol, though he has an inhaler in his pocket. He denies any leg swelling or pain, abdominal pain, back pain.  The history is provided by the patient and the EMS personnel.    Past Medical History  Diagnosis Date  . COPD (chronic obstructive pulmonary disease)   . Anxiety   . Alcohol abuse   . Anemia   . Generalized headaches   . Hypertension   . 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 Pulmonary nodule/lesion, solitary   . Schizoaffective disorder   . GI bleed     History of recurrent bleeding that dates back as far as 2004 per records,    Past Surgical History  Procedure Date  . No past surgeries   . Colonoscopy Sept 2009    SLF: few small AVMs in ascending colon distal to IC . Ablated via BICAP.   Marland Kitchen Esophagogastroduodenoscopy Sept 2009    SLF: normal esophagus, small HH, 1-2 AVMs actively oozing in mid stomach, s/p BICAP, path benign   . Esophagogastroduodenoscopy 01/18/2012    Rourk-Erosive reflux esophagitis. Noncritical ring. Antral erosions/ small antral ulcer - appeared innocent  (H pylori serrology negative)  . Colonoscopy 03/09/2012    Procedure: COLONOSCOPY;  Surgeon: West Bali, MD;  Location: [facility];  Service: Endoscopy;  Laterality: N/A;  2:30  . Esophagogastroduodenoscopy  03/09/2012    Procedure: ESOPHAGOGASTRODUODENOSCOPY (EGD);  Surgeon: West Bali, MD;  Location: [facility];  Service: Endoscopy;  Laterality: N/A;    Family History  Problem Relation Age of Onset  . Colon cancer Neg Hx     History  Substance Use Topics  . Smoking status: Former Smoker    Quit date: 05/07/1969  . Smokeless tobacco: Current User    Types: Snuff  . Alcohol Use: 2.4 oz/week    4 Cans of beer per week     ETOH-pt states quit 12/2011      Review of Systems  Constitutional: Negative for fever and activity change.  Respiratory: Positive for cough and shortness of breath. Negative for chest tightness.   Cardiovascular: Negative for chest pain.  Gastrointestinal: Negative for nausea, vomiting and abdominal pain.  Genitourinary: Negative for dysuria and hematuria.  Musculoskeletal: Negative for back pain.  Skin: Negative for rash.  Neurological: Negative for dizziness, weakness and headaches.    Allergies  Black pepper  Home Medications   Current Outpatient Rx  Name Route Sig Dispense Refill  . ALBUTEROL SULFATE HFA 108 (90 BASE) MCG/ACT IN AERS Inhalation Inhale 2 puffs into the lungs every 6 (six) hours as needed. For shortness of breath    . ASPIRIN 325 MG PO TABS Oral Take 325 mg by mouth every 6 (six) hours as needed. For pain    . DIAZEPAM 5 MG PO  TABS Oral Take 5 mg by mouth 2 (two) times daily.    Marland Kitchen FERROUS SULFATE 325 (65 FE) MG PO TABS Oral Take 1 tablet (325 mg total) by mouth 3 (three) times daily with meals. 100 tablet 0  . HYDROCODONE-ACETAMINOPHEN 5-325 MG PO TABS Oral Take 1 tablet by mouth every 6 (six) hours as needed for pain. 15 tablet 0  . MUPIROCIN CALCIUM 2 % EX CREA Topical Apply topically 3 (three) times daily. 15 g 0    BP 124/71  Pulse 85  Temp 98.4 F (36.9 C) (Oral)  Resp 20  Ht 5\' 4"  (1.626 m)  Wt 115 lb (52.164 kg)  BMI 19.74 kg/m2  SpO2 98%  Physical Exam  Constitutional: He is oriented to person, place, and time.  He appears well-developed and well-nourished. No distress.  HENT:  Head: Normocephalic and atraumatic.  Mouth/Throat: Oropharynx is clear and moist. No oropharyngeal exudate.       Pale conjunctiva  Eyes: Conjunctivae and EOM are normal. Pupils are equal, round, and reactive to light.  Neck: Normal range of motion. Neck supple.  Cardiovascular: Normal rate, regular rhythm and normal heart sounds.   Pulmonary/Chest: Effort normal. No respiratory distress. He has wheezes.       Prolonged expiratory phase with scattered wheezes  Abdominal: Soft. There is no tenderness. There is no rebound and no guarding.  Musculoskeletal: Normal range of motion. He exhibits no edema and no tenderness.  Neurological: He is alert and oriented to person, place, and time. No cranial nerve deficit.  Skin: Skin is warm.    ED Course  Procedures (including critical care time)   Labs Reviewed  CBC  DIFFERENTIAL  BASIC METABOLIC PANEL  CARDIAC PANEL(CRET KIN+CKTOT+MB+TROPI)  ETHANOL   No results found.   1. Shortness of breath       MDM  History of COPD, AV malformations, anemia presenting with shortness of breath for the past one day. No chest pain. Denies any cough, fever, chills. Ex smoker. Suspect COPD though patient has pale conjunctiva, with history of GI bleeding and profound anemia. Patient has history of leaving AGAINST MEDICAL ADVICE and not waiting for results.  Patient refuses x-ray and blood draw. I explained to the patient that blood work is necessary as he has a history of anemia and has required blood transfusions in the past. He has pale conjunctiva, and low hemoglobin could definitely contribute to her shortness of breath. He continues to refuse. He is alert oriented and understands the risks of leaving including heart attack and death       Date: 05-11-12  Rate: 89  Rhythm: normal sinus rhythm  QRS Axis: normal  Intervals: normal  ST/T Wave abnormalities: normal   Conduction Disutrbances:none  Narrative Interpretation:   Old EKG Reviewed: unchanged    Glynn Octave, MD 2012-05-11 2109

## 2012-04-19 NOTE — ED Notes (Signed)
Pt refusing lab draw and to change into gown. Pt states he wants to leave. edp in with pt discussing risks of leaving ed. Pt states he understands and doesn't want any tests done and he wants to leave.

## 2012-04-23 ENCOUNTER — Other Ambulatory Visit: Payer: Self-pay

## 2012-04-23 ENCOUNTER — Encounter (HOSPITAL_COMMUNITY): Payer: Self-pay | Admitting: *Deleted

## 2012-04-23 ENCOUNTER — Emergency Department (HOSPITAL_COMMUNITY)
Admission: EM | Admit: 2012-04-23 | Discharge: 2012-04-23 | Discharge status: Against Medical Advice | Payer: PRIVATE HEALTH INSURANCE | Attending: Emergency Medicine | Admitting: Emergency Medicine

## 2012-04-23 DIAGNOSIS — F411 Generalized anxiety disorder: Secondary | ICD-10-CM | POA: Insufficient documentation

## 2012-04-23 DIAGNOSIS — J441 Chronic obstructive pulmonary disease with (acute) exacerbation: Secondary | ICD-10-CM

## 2012-04-23 DIAGNOSIS — I10 Essential (primary) hypertension: Secondary | ICD-10-CM | POA: Insufficient documentation

## 2012-04-23 DIAGNOSIS — Z87891 Personal history of nicotine dependence: Secondary | ICD-10-CM | POA: Insufficient documentation

## 2012-04-23 DIAGNOSIS — F259 Schizoaffective disorder, unspecified: Secondary | ICD-10-CM | POA: Insufficient documentation

## 2012-04-23 MED ORDER — IPRATROPIUM BROMIDE 0.02 % IN SOLN: 0.5000 mg | Freq: Once | RESPIRATORY_TRACT | Status: DC

## 2012-04-23 MED ORDER — ALBUTEROL SULFATE (5 MG/ML) 0.5% IN NEBU: 5.0000 mg | INHALATION_SOLUTION | Freq: Once | RESPIRATORY_TRACT | Status: DC

## 2012-04-23 MED ORDER — ALBUTEROL SULFATE (5 MG/ML) 0.5% IN NEBU
5.0000 mg | INHALATION_SOLUTION | Freq: Once | RESPIRATORY_TRACT | Status: AC
  Administered 2012-04-23: 5 mg via RESPIRATORY_TRACT
  Filled 2012-04-23: qty 1

## 2012-04-23 MED ORDER — IPRATROPIUM BROMIDE 0.02 % IN SOLN
0.5000 mg | Freq: Once | RESPIRATORY_TRACT | Status: AC
  Administered 2012-04-23: 0.5 mg via RESPIRATORY_TRACT
  Filled 2012-04-23: qty 2.5

## 2012-04-23 NOTE — ED Provider Notes (Cosign Needed)
History   This chart was scribed for Ward Givens, MD by Sofie Rower. The patient was seen in room APA02/AP[facility]02 and the patient's care was started at 4:35 PM     CSN: [id number]  Arrival date & time 04/23/12  1520   First MD Initiated Contact with Patient 04/23/12 1610      Chief Complaint  Patient presents with  . Shortness of Breath    (Consider location/radiation/quality/duration/timing/severity/associated sxs/prior treatment) HPI  [name] is a 78 y.o. male who presents to the Emergency Department complaining of shortness of breath onset today with associated symptoms of wheezing, pain in the left shoulder (onset 1 hour ago, it just came up and left). The pt has not seen Dr. Juanetta Gosling for a long period of time. The pt insists the he needs a breather. Modifying factors include taking cough drops which provide moderate relief. Pt has a hx of COPD.  Pt denies cough, fever, vomiting, diarrhea, smoking, drinking. Pt lives with his two sons.   PCP is Dr. Juanetta Gosling.    Past Medical History  Diagnosis Date  . COPD (chronic obstructive pulmonary disease)   . Anxiety   . Alcohol abuse   . Anemia   . Generalized headaches   . Hypertension   . 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 Pulmonary nodule/lesion, solitary   . Schizoaffective disorder   . GI bleed     History of recurrent bleeding that dates back as far as 2004 per records,    Past Surgical History  Procedure Date  . No past surgeries   . Colonoscopy Sept 2009    SLF: few small AVMs in ascending colon distal to IC . Ablated via BICAP.   Marland Kitchen Esophagogastroduodenoscopy Sept 2009    SLF: normal esophagus, small HH, 1-2 AVMs actively oozing in mid stomach, s/p BICAP, path benign   . Esophagogastroduodenoscopy 01/18/2012    Rourk-Erosive reflux esophagitis. Noncritical ring. Antral erosions/ small antral ulcer - appeared innocent  (H pylori serrology negative)  .  Colonoscopy 03/09/2012    Procedure: COLONOSCOPY;  Surgeon: West Bali, MD;  Location: [facility];  Service: Endoscopy;  Laterality: N/A;  2:30  . Esophagogastroduodenoscopy 03/09/2012    Procedure: ESOPHAGOGASTRODUODENOSCOPY (EGD);  Surgeon: West Bali, MD;  Location: [facility];  Service: Endoscopy;  Laterality: N/A;    Family History  Problem Relation Age of Onset  . Colon cancer Neg Hx     History  Substance Use Topics  . Smoking status: Former Smoker    Quit date: 05/07/1969  . Smokeless tobacco: Current User    Types: Snuff  . Alcohol Use: 2.4 oz/week    4 Cans of beer per week     ETOH-pt states quit 12/2011   Lives with sons   Review of Systems  All other systems reviewed and are negative.    10 Systems reviewed and all are negative for acute change except as noted in the HPI.    Allergies  Black pepper  Home Medications   Current Outpatient Rx  Name Route Sig Dispense Refill  . ALBUTEROL SULFATE HFA 108 (90 BASE) MCG/ACT IN AERS Inhalation Inhale 2 puffs into the lungs every 6 (six) hours as needed. For shortness of breath    . ASPIRIN 325 MG PO TABS Oral Take 325 mg by mouth every 6 (six) hours as needed. For pain    .  DIAZEPAM 5 MG PO TABS Oral Take 5 mg by mouth 2 (two) times daily.    Marland Kitchen FERROUS SULFATE 325 (65 FE) MG PO TABS Oral Take 1 tablet (325 mg total) by mouth 3 (three) times daily with meals. 100 tablet 0  . HYDROCODONE-ACETAMINOPHEN 5-325 MG PO TABS Oral Take 1 tablet by mouth every 6 (six) hours as needed for pain. 15 tablet 0    BP 96/53  Pulse 92  Temp 98.2 F (36.8 C) (Oral)  Resp 20  Ht 5\' 3"  (1.6 m)  Wt 115 lb (52.164 kg)  BMI 20.37 kg/m2  SpO2 93%  Vital signs normal except type of tension that is mild   Physical Exam  Nursing note and vitals reviewed. Constitutional:       Small elderly male who uses profanity easily  HENT:  Head: Normocephalic and atraumatic.  Right Ear: External ear normal.  Left Ear:  External ear normal.  Nose: Nose normal.       Tongue dry.   Eyes: Conjunctivae and EOM are normal. Pupils are equal, round, and reactive to light. Right eye exhibits no discharge. Left eye exhibits no discharge.  Neck: Normal range of motion. Neck supple.  Cardiovascular: Normal rate and normal heart sounds.   Pulmonary/Chest: Effort normal. No respiratory distress. He has wheezes (Diffuse). He has no rales. He exhibits no tenderness.  Musculoskeletal: Normal range of motion. He exhibits no edema.  Neurological: He is alert.  Skin: Skin is warm and dry.  Psychiatric: He has a normal mood and affect. His behavior is normal.    ED Course  Procedures (including critical care time)   Medications  albuterol (PROVENTIL) (5 MG/ML) 0.5% nebulizer solution 5 mg (not administered)  ipratropium (ATROVENT) nebulizer solution 0.5 mg (not administered)  albuterol (PROVENTIL) (5 MG/ML) 0.5% nebulizer solution 5 mg (5 mg Nebulization Given 04/23/12 1649)  ipratropium (ATROVENT) nebulizer solution 0.5 mg (0.5 mg Nebulization Given 04/23/12 1649)    Pt rechecked after his first nebulizer. He still has some expiratory wheezing. Advised he needs a second nebulizer. Pt states he just wants an albuterol inhaler and he will leave. Advised he needs to have his wheezing improved before he can be discharged with an inhaler.   DIAGNOSTIC STUDIES: Oxygen Saturation is 93% on room air, normal by my interpretation.    COORDINATION OF CARE:  4:41PM- EDP at bedside discusses treatment plan concerning breathing treatment.   5:18PM- EDP at bedside continues treatment plan, advises to order a 2nd breathing treatment.     Date: 04/23/2012  Rate: 83  Rhythm: normal sinus rhythm  QRS Axis: normal  Intervals: normal  ST/T Wave abnormalities: normal  Conduction Disutrbances:none  Narrative Interpretation:   Old EKG Reviewed: unchanged from 04/19/2012    1. COPD exacerbation    Pt left AMA, he has done this  most ED visits, he just wanted to walk in and get an albuterol inhaler and leave and not be treated appropriately for his condition   MDM    I personally performed the services described in this documentation, which was scribed in my presence. The recorded information has been reviewed and considered.  Devoria Albe, MD, FACEP    Ward Givens, MD 04/23/12 1729  Ward Givens, MD 04/23/12 1731

## 2012-04-23 NOTE — ED Notes (Signed)
Pt walked out hollering for a puffer and refused his 2nd neb treatment.

## 2012-04-23 NOTE — ED Notes (Addendum)
Sob, dizzy, weak,  Says he wants to go to [facility], that he has been coming here "too much"..  Alert, talking, "out of my inhalers"

## 2012-04-26 ENCOUNTER — Emergency Department (HOSPITAL_COMMUNITY)
Admission: EM | Admit: 2012-04-26 | Discharge: 2012-04-27 | Disposition: A | Discharge status: Home / Self Care | Payer: PRIVATE HEALTH INSURANCE | Attending: Emergency Medicine | Admitting: Emergency Medicine

## 2012-04-26 ENCOUNTER — Emergency Department (HOSPITAL_COMMUNITY): Payer: PRIVATE HEALTH INSURANCE

## 2012-04-26 ENCOUNTER — Encounter (HOSPITAL_COMMUNITY): Payer: Self-pay

## 2012-04-26 DIAGNOSIS — J441 Chronic obstructive pulmonary disease with (acute) exacerbation: Secondary | ICD-10-CM | POA: Insufficient documentation

## 2012-04-26 DIAGNOSIS — I10 Essential (primary) hypertension: Secondary | ICD-10-CM | POA: Insufficient documentation

## 2012-04-26 DIAGNOSIS — R0602 Shortness of breath: Secondary | ICD-10-CM | POA: Insufficient documentation

## 2012-04-26 DIAGNOSIS — R062 Wheezing: Secondary | ICD-10-CM | POA: Insufficient documentation

## 2012-04-26 MED ORDER — ALBUTEROL SULFATE (5 MG/ML) 0.5% IN NEBU
2.5000 mg | INHALATION_SOLUTION | Freq: Once | RESPIRATORY_TRACT | Status: AC
  Administered 2012-04-26: 2.5 mg via RESPIRATORY_TRACT
  Filled 2012-04-26: qty 0.5

## 2012-04-26 MED ORDER — PREDNISONE 50 MG PO TABS: 50.0000 mg | ORAL_TABLET | Freq: Every day | ORAL | Status: DC

## 2012-04-26 MED ORDER — PREDNISONE 20 MG PO TABS
60.0000 mg | ORAL_TABLET | Freq: Once | ORAL | Status: AC
  Administered 2012-04-27: 60 mg via ORAL
  Filled 2012-04-26: qty 3

## 2012-04-26 MED ORDER — ALBUTEROL SULFATE HFA 108 (90 BASE) MCG/ACT IN AERS
2.0000 | INHALATION_SPRAY | RESPIRATORY_TRACT | Status: DC | PRN
  Administered 2012-04-26: 2 via RESPIRATORY_TRACT
  Filled 2012-04-26: qty 6.7

## 2012-04-26 MED ORDER — IPRATROPIUM BROMIDE 0.02 % IN SOLN
0.5000 mg | Freq: Once | RESPIRATORY_TRACT | Status: AC
  Administered 2012-04-26: 0.5 mg via RESPIRATORY_TRACT
  Filled 2012-04-26: qty 2.5

## 2012-04-26 NOTE — ED Notes (Signed)
I ate ate some beef and white potatoes and started choking per pt. Now I am having some trouble breathing per pt. Can swallow water, just need a breathing treatment per pt.

## 2012-04-26 NOTE — ED Provider Notes (Signed)
History    This chart was scribed for Dione Booze, MD, MD by Smitty Pluck. The patient was seen in room APA16A and the patient's care was started at 10:29PM.   CSN: [id number]  Arrival date & time 04/26/12  2202   First MD Initiated Contact with Patient 04/26/12 2225      Chief Complaint  Patient presents with  . Shortness of Breath    (Consider location/radiation/quality/duration/timing/severity/associated sxs/prior treatment) The history is provided by the patient.   [name] is a 78 y.o. male who presents to the Emergency Department complaining of moderate SOB onset today this PM. Pt reports SOB started 1 hour after eating corn beef hash for dinner. Pt denies coughing, fever, chills, diaphoresis and chest pain. He reports mild back pain. He reports running out of his albuterol inhaler. Denies smoking. Denies any aggravating or alleviating factors. Symptoms have been constant since onset. There is no radiation.   Past Medical History  Diagnosis Date  . COPD (chronic obstructive pulmonary disease)   . Anxiety   . Alcohol abuse   . Anemia   . Generalized headaches   . Hypertension   . 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 Pulmonary nodule/lesion, solitary   . Schizoaffective disorder   . GI bleed     History of recurrent bleeding that dates back as far as 2004 per records,    Past Surgical History  Procedure Date  . No past surgeries   . Colonoscopy Sept 2009    SLF: few small AVMs in ascending colon distal to IC . Ablated via BICAP.   Marland Kitchen Esophagogastroduodenoscopy Sept 2009    SLF: normal esophagus, small HH, 1-2 AVMs actively oozing in mid stomach, s/p BICAP, path benign   . Esophagogastroduodenoscopy 01/18/2012    Rourk-Erosive reflux esophagitis. Noncritical ring. Antral erosions/ small antral ulcer - appeared innocent  (H pylori serrology negative)  . Colonoscopy 03/09/2012    Procedure: COLONOSCOPY;   Surgeon: West Bali, MD;  Location: [facility];  Service: Endoscopy;  Laterality: N/A;  2:30  . Esophagogastroduodenoscopy 03/09/2012    Procedure: ESOPHAGOGASTRODUODENOSCOPY (EGD);  Surgeon: West Bali, MD;  Location: [facility];  Service: Endoscopy;  Laterality: N/A;    Family History  Problem Relation Age of Onset  . Colon cancer Neg Hx     History  Substance Use Topics  . Smoking status: Former Smoker    Quit date: 05/07/1969  . Smokeless tobacco: Current User    Types: Snuff  . Alcohol Use: 2.4 oz/week    4 Cans of beer per week     ETOH-pt states quit 12/2011      Review of Systems  All other systems reviewed and are negative.   10 Systems reviewed and all are negative for acute change except as noted in the HPI.   Allergies  Black pepper  Home Medications   Current Outpatient Rx  Name Route Sig Dispense Refill  . ALBUTEROL SULFATE HFA 108 (90 BASE) MCG/ACT IN AERS Inhalation Inhale 2 puffs into the lungs every 6 (six) hours as needed. For shortness of breath    . ASPIRIN 325 MG PO TABS Oral Take 325 mg by mouth every 6 (six) hours as needed. For pain    . DIAZEPAM 5 MG PO TABS Oral Take 5 mg by mouth 2 (two) times daily.    Marland Kitchen FERROUS SULFATE 325 (65 FE)  MG PO TABS Oral Take 1 tablet (325 mg total) by mouth 3 (three) times daily with meals. 100 tablet 0    BP 131/79  Pulse 80  Temp 97.5 F (36.4 C) (Oral)  Resp 20  Ht 5\' 3"  (1.6 m)  Wt 125 lb 6.4 oz (56.881 kg)  BMI 22.21 kg/m2  SpO2 98%  Physical Exam  Nursing note and vitals reviewed. Constitutional: He is oriented to person, place, and time. He appears well-developed and well-nourished. No distress.  HENT:  Head: Normocephalic and atraumatic.  Eyes: EOM are normal. Pupils are equal, round, and reactive to light.  Neck: Normal range of motion. Neck supple. No tracheal deviation present.  Cardiovascular: Normal rate.   Pulmonary/Chest: Effort normal. No respiratory distress. He has  decreased breath sounds (throughout). He has wheezes in the right upper field and the left upper field.  Abdominal: Soft. He exhibits no distension.  Musculoskeletal: Normal range of motion.  Neurological: He is alert and oriented to person, place, and time.  Skin: Skin is warm and dry.  Psychiatric: He has a normal mood and affect. His behavior is normal.    ED Course  Procedures (including critical care time) DIAGNOSTIC STUDIES: Oxygen Saturation is 98% on room air, normal by my interpretation.    COORDINATION OF CARE: 10:31PM EDP discusses pt ED treatment with pt 10:30PM EDP orders medication: Proventil 0.5% nebulizer 2.5 mg, Atrovent 0.5 mg   Dg Chest 2 View  04/27/2012  *RADIOLOGY REPORT*  Clinical Data: 78 year old male with shortness of breath.  CHEST - 2 VIEW  Comparison: 04/01/2012 and earlier.  Findings: Increased upper lobe predominant interstitial and hazy markings, worse in the right upper lobe.  These do not seem to be technique related.  Stable lung volumes.  Stable cardiac size and mediastinal contours.  No pneumothorax, pulmonary edema or pleural effusion.  IMPRESSION: Increased indistinct and interstitial upper lobe opacity greater on the right, not felt to be related to technical artifact, compatible with acute respiratory infection superimposed on chronic lung disease.  Original Report Authenticated By: Harley Hallmark, M.D.     1. COPD exacerbation       MDM  COPD exacerbation secondary to his running out of albuterol. He will be given an albuterol nebulizer treatment and chest x-ray obtained. He also be given a dose of prednisone. I anticipate sending him home with a prescription for prednisone and with an albuterol inhaler  I personally performed the services described in this documentation, which was scribed in my presence. The recorded information has been reviewed and considered.          Dione Booze, MD 04/27/12 [phone]

## 2012-04-26 NOTE — ED Notes (Signed)
Patient states that he does not have any inhalers left and does not have any money until next week to buy one.

## 2012-04-26 NOTE — Discharge Instructions (Signed)
Chronic Obstructive Pulmonary Disease Chronic obstructive pulmonary disease (COPD) is a condition in which airflow from the lungs is restricted. The lungs can never return to normal, but there are measures you can take which will improve them and make you feel better. CAUSES   Smoking.   Exposure to secondhand smoke.   Breathing in irritants (pollution, cigarette smoke, strong smells, aerosol sprays, paint fumes).   History of lung infections.  TREATMENT  Treatment focuses on making you comfortable (supportive care). Your caregiver may prescribe medications (inhaled or pills) to help improve your breathing. HOME CARE INSTRUCTIONS   If you smoke, stop smoking.   Avoid exposure to smoke, chemicals, and fumes that aggravate your breathing.   Take antibiotic medicines as directed by your caregiver.   Avoid medicines that dry up your system and slow down the elimination of secretions (antihistamines and cough syrups). This decreases respiratory capacity and may lead to infections.   Drink enough water and fluids to keep your urine clear or pale yellow. This loosens secretions.   Use humidifiers at home and at your bedside if they do not make breathing difficult.   Receive all protective vaccines your caregiver suggests, especially pneumococcal and influenza.   Use home oxygen as suggested.   Stay active. Exercise and physical activity will help maintain your ability to do things you want to do.   Eat a healthy diet.  SEEK MEDICAL CARE IF:   You develop pus-like mucus (sputum).   Breathing is more labored or exercise becomes difficult to do.   You are running out of the medicine you take for your breathing.  SEEK IMMEDIATE MEDICAL CARE IF:   You have a rapid heart rate.   You have agitation, confusion, tremors, or are in a stupor (family members may need to observe this).   It becomes difficult to breathe.   You develop chest pain.   You have a fever.  MAKE SURE YOU:    Understand these instructions.   Will watch your condition.   Will get help right away if you are not doing well or get worse.  Document Released: 07/27/2005 Document Revised: 10/06/2011 Document Reviewed: 12/17/2010 [facility] Patient Information 2012 [place], [place].  Prednisone tablets What is this medicine? PREDNISONE (PRED ni sone) is a corticosteroid. It is commonly used to treat inflammation of the skin, joints, lungs, and other organs. Common conditions treated include asthma, allergies, and arthritis. It is also used for other conditions, such as blood disorders and diseases of the adrenal glands. This medicine may be used for other purposes; ask your health care provider or pharmacist if you have questions. What should I tell my health care provider before I take this medicine? They need to know if you have any of these conditions: -Cushing's syndrome -diabetes -glaucoma -heart disease -high blood pressure -infection (especially a virus infection such as chickenpox, cold sores, or herpes) -kidney disease -liver disease -mental illness -myasthenia gravis -osteoporosis -seizures -stomach or intestine problems -thyroid disease -an unusual or allergic reaction to lactose, prednisone, other medicines, foods, dyes, or preservatives -pregnant or trying to get pregnant -breast-feeding How should I use this medicine? Take this medicine by mouth with a glass of water. Follow the directions on the prescription label. Take this medicine with food. If you are taking this medicine once a day, take it in the morning. Do not take more medicine than you are told to take. Do not suddenly stop taking your medicine because you may develop a severe reaction.  Your doctor will tell you how much medicine to take. If your doctor wants you to stop the medicine, the dose may be slowly lowered over time to avoid any side effects. Talk to your pediatrician regarding the use of this medicine in  children. Special care may be needed. Overdosage: If you think you have taken too much of this medicine contact a poison control center or emergency room at once. NOTE: This medicine is only for you. Do not share this medicine with others. What if I miss a dose? If you miss a dose, take it as soon as you can. If it is almost time for your next dose, talk to your doctor or health care professional. You may need to miss a dose or take an extra dose. Do not take double or extra doses without advice. What may interact with this medicine? Do not take this medicine with any of the following medications: -metyrapone -mifepristone This medicine may also interact with the following medications: -aminoglutethimide -amphotericin B -aspirin and aspirin-like medicines -barbiturates -certain medicines for diabetes, like glipizide or glyburide -cholestyramine -cholinesterase inhibitors -cyclosporine -digoxin -diuretics -ephedrine -male hormones, like estrogens and birth control pills -isoniazid -ketoconazole -NSAIDS, medicines for pain and inflammation, like ibuprofen or naproxen -phenytoin -rifampin -toxoids -vaccines -warfarin This list may not describe all possible interactions. Give your health care provider a list of all the medicines, herbs, non-prescription drugs, or dietary supplements you use. Also tell them if you smoke, drink alcohol, or use illegal drugs. Some items may interact with your medicine. What should I watch for while using this medicine? Visit your doctor or health care professional for regular checks on your progress. If you are taking this medicine over a prolonged period, carry an identification card with your name and address, the type and dose of your medicine, and your doctor's name and address. This medicine may increase your risk of getting an infection. Tell your doctor or health care professional if you are around anyone with measles or chickenpox, or if you  develop sores or blisters that do not heal properly. If you are going to have surgery, tell your doctor or health care professional that you have taken this medicine within the last twelve months. Ask your doctor or health care professional about your diet. You may need to lower the amount of salt you eat. This medicine may affect blood sugar levels. If you have diabetes, check with your doctor or health care professional before you change your diet or the dose of your diabetic medicine. What side effects may I notice from receiving this medicine? Side effects that you should report to your doctor or health care professional as soon as possible: -allergic reactions like skin rash, itching or hives, swelling of the face, lips, or tongue -changes in emotions or moods -changes in vision -depressed mood -eye pain -fever or chills, cough, sore throat, pain or difficulty passing urine -increased thirst -swelling of ankles, feet Side effects that usually do not require medical attention (report to your doctor or health care professional if they continue or are bothersome): -confusion, excitement, restlessness -headache -nausea, vomiting -skin problems, acne, thin and shiny skin -trouble sleeping -weight gain This list may not describe all possible side effects. Call your doctor for medical advice about side effects. You may report side effects to FDA at 1-800-FDA-1088. Where should I keep my medicine? Keep out of the reach of children. Store at room temperature between 15 and 30 degrees C (59 and 86 degrees F).  Protect from light. Keep container tightly closed. Throw away any unused medicine after the expiration date. NOTE: This sheet is a summary. It may not cover all possible information. If you have questions about this medicine, talk to your doctor, pharmacist, or health care provider.  2012, [employer]vier/[employer] Standard. (06/02/2011 10:57:14 AM)  Albuterol inhalation aerosol What is this  medicine? ALBUTEROL (al Gaspar Bidding) is a bronchodilator. It helps open up the airways in your lungs to make it easier to breathe. This medicine is used to treat and to prevent bronchospasm. This medicine may be used for other purposes; ask your health care provider or pharmacist if you have questions. What should I tell my health care provider before I take this medicine? They need to know if you have any of the following conditions: -diabetes -heart disease or irregular heartbeat -high blood pressure -pheochromocytoma -seizures -thyroid disease -an unusual or allergic reaction to albuterol, levalbuterol, sulfites, other medicines, foods, dyes, or preservatives -pregnant or trying to get pregnant -breast-feeding How should I use this medicine? This medicine is for inhalation through the mouth. Follow the directions on your prescription label. Take your medicine at regular intervals. Do not use more often than directed. Make sure that you are using your inhaler correctly. Ask you doctor or health care provider if you have any questions. Use this medicine before you use any other inhaler. Wait 5 minutes or more before between using different inhalers. Talk to your pediatrician regarding the use of this medicine in children. Special care may be needed. Overdosage: If you think you have taken too much of this medicine contact a poison control center or emergency room at once. NOTE: This medicine is only for you. Do not share this medicine with others. What if I miss a dose? If you miss a dose, use it as soon as you can. If it is almost time for your next dose, use only that dose. Do not use double or extra doses. What may interact with this medicine? -anti-infectives like chloroquine and pentamidine -caffeine -cisapride -diuretics -medicines for colds -medicines for depression or for emotional or psychotic conditions -medicines for weight loss including some herbal  products -methadone -some antibiotics like clarithromycin, erythromycin, levofloxacin, and linezolid -some heart medicines -steroid hormones like dexamethasone, cortisone, hydrocortisone -theophylline -thyroid hormones This list may not describe all possible interactions. Give your health care provider a list of all the medicines, herbs, non-prescription drugs, or dietary supplements you use. Also tell them if you smoke, drink alcohol, or use illegal drugs. Some items may interact with your medicine. What should I watch for while using this medicine? Tell your doctor or health care professional if your symptoms do not improve. Do not use extra albuterol. If your asthma or bronchitis gets worse while you are using this medicine, call your doctor right away. If your mouth gets dry try chewing sugarless gum or sucking hard candy. Drink water as directed. What side effects may I notice from receiving this medicine? Side effects that you should report to your doctor or health care professional as soon as possible: -allergic reactions like skin rash, itching or hives, swelling of the face, lips, or tongue -breathing problems -chest pain -feeling faint or lightheaded, falls -high blood pressure -irregular heartbeat -fever -muscle cramps or weakness -pain, tingling, numbness in the hands or feet -vomiting Side effects that usually do not require medical attention (report to your doctor or health care professional if they continue or are bothersome): -cough -difficulty sleeping -headache -nervousness  or trembling -stomach upset -stuffy or runny nose -throat irritation -unusual taste This list may not describe all possible side effects. Call your doctor for medical advice about side effects. You may report side effects to FDA at 1-800-FDA-1088. Where should I keep my medicine? Keep out of the reach of children. Store at room temperature between 15 and 30 degrees C (59 and 86 degrees F). The  contents are under pressure and may burst when exposed to heat or flame. Do not freeze. This medicine does not work as well if it is too cold. Throw away any unused medicine after the expiration date. Inhalers need to be thrown away after the labeled number of puffs have been used or by the expiration date; whichever comes first. Ventolin HFA should be thrown away 12 months after removing from foil pouch. Check the instructions that come with your medicine. NOTE: This sheet is a summary. It may not cover all possible information. If you have questions about this medicine, talk to your doctor, pharmacist, or health care provider.  2012, [employer]vier/Gold Standard. (03/04/2011 11:00:52 AM)

## 2012-04-26 NOTE — ED Notes (Signed)
Patient transported to X-ray 

## 2012-04-26 NOTE — ED Notes (Signed)
Respiratory at bedside administering nebulizer treatment

## 2012-04-27 NOTE — ED Notes (Signed)
Pt provided with additional education regarding proper use of inhaler.  Verbalized understanding and did return demonstrate use.

## 2012-05-08 ENCOUNTER — Encounter (HOSPITAL_COMMUNITY): Payer: Self-pay | Admitting: *Deleted

## 2012-05-08 ENCOUNTER — Emergency Department (HOSPITAL_COMMUNITY)
Admission: EM | Admit: 2012-05-08 | Discharge: 2012-05-08 | Disposition: A | Discharge status: Home / Self Care | Payer: PRIVATE HEALTH INSURANCE | Attending: Emergency Medicine | Admitting: Emergency Medicine

## 2012-05-08 DIAGNOSIS — R0602 Shortness of breath: Secondary | ICD-10-CM | POA: Insufficient documentation

## 2012-05-08 NOTE — ED Notes (Signed)
Reported that pt left before being seen

## 2012-05-08 NOTE — ED Notes (Signed)
No answer in waiting room 

## 2012-05-08 NOTE — ED Notes (Signed)
No answer in waiting room or outside,  

## 2012-05-08 NOTE — ED Notes (Addendum)
Pt c/o sob that started this am, increases with activity, denies any cough, fever, pain, pt states that he is out of his inhaler that he uses at home and needs a refill.

## 2012-05-09 ENCOUNTER — Encounter (HOSPITAL_COMMUNITY): Payer: Self-pay | Admitting: *Deleted

## 2012-05-09 ENCOUNTER — Inpatient Hospital Stay (HOSPITAL_COMMUNITY)
Admission: EM | Admit: 2012-05-09 | Discharge: 2012-05-10 | DRG: 192 | Discharge status: Against Medical Advice | Payer: PRIVATE HEALTH INSURANCE | Attending: Pulmonary Disease | Admitting: Pulmonary Disease

## 2012-05-09 ENCOUNTER — Emergency Department (HOSPITAL_COMMUNITY): Payer: PRIVATE HEALTH INSURANCE

## 2012-05-09 ENCOUNTER — Other Ambulatory Visit: Payer: Self-pay

## 2012-05-09 DIAGNOSIS — Q2733 Arteriovenous malformation of digestive system vessel: Secondary | ICD-10-CM | POA: Diagnosis present

## 2012-05-09 DIAGNOSIS — Z7982 Long term (current) use of aspirin: Secondary | ICD-10-CM

## 2012-05-09 DIAGNOSIS — K5520 Angiodysplasia of colon without hemorrhage: Secondary | ICD-10-CM

## 2012-05-09 DIAGNOSIS — J441 Chronic obstructive pulmonary disease with (acute) exacerbation: Principal | ICD-10-CM | POA: Diagnosis present

## 2012-05-09 DIAGNOSIS — IMO0002 Reserved for concepts with insufficient information to code with codable children: Secondary | ICD-10-CM

## 2012-05-09 DIAGNOSIS — F411 Generalized anxiety disorder: Secondary | ICD-10-CM | POA: Diagnosis present

## 2012-05-09 DIAGNOSIS — F1020 Alcohol dependence, uncomplicated: Secondary | ICD-10-CM | POA: Diagnosis present

## 2012-05-09 DIAGNOSIS — F259 Schizoaffective disorder, unspecified: Secondary | ICD-10-CM | POA: Diagnosis present

## 2012-05-09 DIAGNOSIS — I10 Essential (primary) hypertension: Secondary | ICD-10-CM | POA: Diagnosis present

## 2012-05-09 DIAGNOSIS — F419 Anxiety disorder, unspecified: Secondary | ICD-10-CM

## 2012-05-09 DIAGNOSIS — D649 Anemia, unspecified: Secondary | ICD-10-CM | POA: Diagnosis present

## 2012-05-09 DIAGNOSIS — Z79899 Other long term (current) drug therapy: Secondary | ICD-10-CM

## 2012-05-09 DIAGNOSIS — Z87891 Personal history of nicotine dependence: Secondary | ICD-10-CM

## 2012-05-09 LAB — CBC WITH DIFFERENTIAL/PLATELET
Basophils Relative: 1 % (ref 0–1)
Eosinophils Absolute: 0.3 10*3/uL (ref 0.0–0.7)
HCT: 32.5 % — ABNORMAL LOW (ref 39.0–52.0)
Hemoglobin: 10.3 g/dL — ABNORMAL LOW (ref 13.0–17.0)
MCH: 27.8 pg (ref 26.0–34.0)
MCHC: 31.7 g/dL (ref 30.0–36.0)
Monocytes Absolute: 0.6 10*3/uL (ref 0.1–1.0)
Neutro Abs: 1.5 10*3/uL — ABNORMAL LOW (ref 1.7–7.7)

## 2012-05-09 LAB — COMPREHENSIVE METABOLIC PANEL
AST: 31 U/L (ref 0–37)
Albumin: 3.3 g/dL — ABNORMAL LOW (ref 3.5–5.2)
Chloride: 103 mEq/L (ref 96–112)
Creatinine, Ser: 0.8 mg/dL (ref 0.50–1.35)
Potassium: 3.6 mEq/L (ref 3.5–5.1)
Total Bilirubin: 0.2 mg/dL — ABNORMAL LOW (ref 0.3–1.2)

## 2012-05-09 LAB — CARDIAC PANEL(CRET KIN+CKTOT+MB+TROPI)
Relative Index: 2.9 — ABNORMAL HIGH (ref 0.0–2.5)
Troponin I: <0.3 ng/mL (ref <0.30)

## 2012-05-09 MED ORDER — METHYLPREDNISOLONE SODIUM SUCC 125 MG IJ SOLR
125.0000 mg | Freq: Once | INTRAMUSCULAR | Status: AC
  Administered 2012-05-09: 125 mg via INTRAVENOUS
  Filled 2012-05-09: qty 2

## 2012-05-09 MED ORDER — MAGNESIUM SULFATE 40 MG/ML IJ SOLN
2.0000 g | Freq: Once | INTRAMUSCULAR | Status: AC
  Administered 2012-05-09: 2 g via INTRAVENOUS
  Filled 2012-05-09: qty 50

## 2012-05-09 MED ORDER — ALBUTEROL (5 MG/ML) CONTINUOUS INHALATION SOLN
10.0000 mg/h | INHALATION_SOLUTION | Freq: Once | RESPIRATORY_TRACT | Status: AC
  Administered 2012-05-09: 10 mg/h via RESPIRATORY_TRACT
  Filled 2012-05-09: qty 20

## 2012-05-09 MED ORDER — SODIUM CHLORIDE 0.9 % IV BOLUS (SEPSIS)
1000.0000 mL | Freq: Once | INTRAVENOUS | Status: AC
  Administered 2012-05-09: 1000 mL via INTRAVENOUS

## 2012-05-09 MED ORDER — MOXIFLOXACIN HCL IN NACL 400 MG/250ML IV SOLN
400.0000 mg | Freq: Once | INTRAVENOUS | Status: AC
  Administered 2012-05-09: 400 mg via INTRAVENOUS
  Filled 2012-05-09: qty 250

## 2012-05-09 NOTE — ED Notes (Signed)
Reported that RA sats was 84%, 5 mg Albuterol and 0.5 mg Artovent given en route,

## 2012-05-09 NOTE — ED Provider Notes (Signed)
History   This chart was scribed for Glynn Octave, MD by Shari Heritage. The patient was seen in room APA10/APA10. Patient's care was started at 2203.     CSN: [id number]  Arrival date & time 05/09/12  2203   None     Chief Complaint  Patient presents with  . Shortness of Breath    (Consider location/radiation/quality/duration/timing/severity/associated sxs/prior treatment) Patient is a 78 y.o. male presenting with shortness of breath. The history is provided by the patient. No language interpreter was used.  Shortness of Breath  The current episode started today. The problem occurs continuously. The problem has been unchanged. The problem is severe. Nothing relieves the symptoms. The symptoms are aggravated by activity. Associated symptoms include shortness of breath and wheezing. Pertinent negatives include no chest pain, no orthopnea, no fever, no rhinorrhea, no sore throat and no cough. There was no intake of a foreign body.   [name] is a 78 y.o. male who presents to the Emergency Department complaining of moderate to severe, SOB onset yesterday while he was mowing the yard. Patient says that he got a new inhaler yesterday, but it hasn't been relieving his breathing problems. Patient denies cough, vomiting, hematochezia, and chest pain. Patient was transported to the ED by EMS. Patient is a former smoker (quit date: 05/07/1969). Patient with medical h/o COPD, anxiety, alcohol abuse, anemia, HTN, and schizoaffective disorder. Patient with surgical h/o colonoscopy x2 and esophagogastroduodenoscopy x3.   PCP - Juanetta Gosling  Past Medical History  Diagnosis Date  . COPD (chronic obstructive pulmonary disease)   . Anxiety   . Alcohol abuse   . Anemia   . Generalized headaches   . Hypertension   . 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 Pulmonary nodule/lesion, solitary   . Schizoaffective disorder   . GI bleed     History of  recurrent bleeding that dates back as far as 2004 per records,    Past Surgical History  Procedure Date  . No past surgeries   . Colonoscopy Sept 2009    SLF: few small AVMs in ascending colon distal to IC . Ablated via BICAP.   Marland Kitchen Esophagogastroduodenoscopy Sept 2009    SLF: normal esophagus, small HH, 1-2 AVMs actively oozing in mid stomach, s/p BICAP, path benign   . Esophagogastroduodenoscopy 01/18/2012    Rourk-Erosive reflux esophagitis. Noncritical ring. Antral erosions/ small antral ulcer - appeared innocent  (H pylori serrology negative)  . Colonoscopy 03/09/2012    Procedure: COLONOSCOPY;  Surgeon: West Bali, MD;  Location: [facility];  Service: Endoscopy;  Laterality: N/A;  2:30  . Esophagogastroduodenoscopy 03/09/2012    Procedure: ESOPHAGOGASTRODUODENOSCOPY (EGD);  Surgeon: West Bali, MD;  Location: [facility];  Service: Endoscopy;  Laterality: N/A;    Family History  Problem Relation Age of Onset  . Colon cancer Neg Hx     History  Substance Use Topics  . Smoking status: Former Smoker    Quit date: 05/07/1969  . Smokeless tobacco: Current User    Types: Snuff  . Alcohol Use: 2.4 oz/week    4 Cans of beer per week     ETOH-pt states quit 12/2011      Review of Systems  Constitutional: Negative for fever.  HENT: Negative for sore throat and rhinorrhea.   Respiratory: Positive for shortness of breath and wheezing. Negative for cough.   Cardiovascular: Negative for chest  pain and orthopnea.  All other systems reviewed and are negative.    Allergies  Black pepper  Home Medications   Current Outpatient Rx  Name Route Sig Dispense Refill  . ASPIRIN 325 MG PO TABS Oral Take 325 mg by mouth every 6 (six) hours as needed. For pain    . FERROUS SULFATE 325 (65 FE) MG PO TABS Oral Take 1 tablet (325 mg total) by mouth 3 (three) times daily with meals. 100 tablet 0  . PREDNISONE 50 MG PO TABS Oral Take 1 tablet (50 mg total) by mouth daily. 5 tablet 0     BP 110/68  Pulse 85  Temp 98.4 F (36.9 C) (Oral)  Resp 22  Ht 5\' 9"  (1.753 m)  Wt 135 lb (61.236 kg)  BMI 19.94 kg/m2  SpO2 96%  Physical Exam  Nursing note and vitals reviewed. Constitutional: He is oriented to person, place, and time. He appears well-developed and well-nourished.  HENT:  Head: Normocephalic and atraumatic.  Eyes: Conjunctivae and EOM are normal. Pupils are equal, round, and reactive to light.  Neck: Normal range of motion. Neck supple.  Cardiovascular: Regular rhythm.  Tachycardia present.   Pulmonary/Chest: He is in respiratory distress (moderate). He has wheezes.       Poor air exchange with inspiratory and expiratory wheezing diffusely.  Abdominal: Soft. Bowel sounds are normal.  Musculoskeletal: Normal range of motion.  Neurological: He is alert and oriented to person, place, and time.  Skin: Skin is warm and dry.  Psychiatric: He has a normal mood and affect.    ED Course  Procedures (including critical care time) DIAGNOSTIC STUDIES: Oxygen Saturation is 97% on room air, normal by my interpretation.    COORDINATION OF CARE: 10:05PM- Patient informed of current plan for treatment and evaluation and agrees with plan at this time.    Results for orders placed during the hospital encounter of 05/09/12  CBC WITH DIFFERENTIAL      Component Value Range   WBC 3.8 (*) 4.0 - 10.5 K/uL   RBC 3.71 (*) 4.22 - 5.81 MIL/uL   Hemoglobin 10.3 (*) 13.0 - 17.0 g/dL   HCT 16.1 (*) 09.6 - 04.5 %   MCV 87.6  78.0 - 100.0 fL   MCH 27.8  26.0 - 34.0 pg   MCHC 31.7  30.0 - 36.0 g/dL   RDW 40.9 (*) 81.1 - 91.4 %   Platelets 396  150 - 400 K/uL   Neutrophils Relative 41 (*) 43 - 77 %   Lymphocytes Relative 36  12 - 46 %   Monocytes Relative 15 (*) 3 - 12 %   Eosinophils Relative 7 (*) 0 - 5 %   Basophils Relative 1  0 - 1 %   Neutro Abs 1.5 (*) 1.7 - 7.7 K/uL   Lymphs Abs 1.4  0.7 - 4.0 K/uL   Monocytes Absolute 0.6  0.1 - 1.0 K/uL   Eosinophils Absolute  0.3  0.0 - 0.7 K/uL   Basophils Absolute 0.0  0.0 - 0.1 K/uL   RBC Morphology SCHISTOCYTES PRESENT (2-5/hpf)     WBC Morphology TOXIC GRANULATION     Smear Review LARGE PLATELETS PRESENT    CARDIAC PANEL(CRET KIN+CKTOT+MB+TROPI)      Component Value Range   Total CK 221  7 - 232 U/L   CK, MB 6.5 (*) 0.3 - 4.0 ng/mL   Troponin I <0.30  <0.30 ng/mL   Relative Index 2.9 (*) 0.0 - 2.5  COMPREHENSIVE  METABOLIC PANEL      Component Value Range   Sodium 137  135 - 145 mEq/L   Potassium 3.6  3.5 - 5.1 mEq/L   Chloride 103  96 - 112 mEq/L   CO2 23  19 - 32 mEq/L   Glucose, Bld 111 (*) 70 - 99 mg/dL   BUN 10  6 - 23 mg/dL   Creatinine, Ser 1.61  0.50 - 1.35 mg/dL   Calcium 9.7  8.4 - 09.6 mg/dL   Total Protein 6.7  6.0 - 8.3 g/dL   Albumin 3.3 (*) 3.5 - 5.2 g/dL   AST 31  0 - 37 U/L   ALT 11  0 - 53 U/L   Alkaline Phosphatase 74  39 - 117 U/L   Total Bilirubin 0.2 (*) 0.3 - 1.2 mg/dL   GFR calc non Af Amer 83 (*) >90 mL/min   GFR calc Af Amer >90  >90 mL/min  BLOOD GAS, ARTERIAL      Component Value Range   O2 Content 2.0     Delivery systems NASAL CANNULA     pH, Arterial 7.361  7.350 - 7.450   pCO2 arterial 41.5  35.0 - 45.0 mmHg   pO2, Arterial 65.8 (*) 80.0 - 100.0 mmHg   Bicarbonate 22.9  20.0 - 24.0 mEq/L   TCO2 21.4  0 - 100 mmol/L   Acid-base deficit 1.7  0.0 - 2.0 mmol/L   O2 Saturation 91.0     Patient temperature 37.0     Collection site RIGHT RADIAL     Drawn by [phone]     Sample type ARTERIAL     Allens test (pass/fail) PASS  PASS    Dg Chest Portable 1 View  05/09/2012  *RADIOLOGY REPORT*  Clinical Data: Shortness of breath; history of smoking.  PORTABLE CHEST - 1 VIEW  Comparison: Chest radiograph performed 04/26/2012  Findings: The lungs are well-aerated.  Mild peribronchial thickening is noted, with mild chronic lung changes.  There is no evidence of pleural effusion or pneumothorax.  Minimal scarring is noted at the lung apices.  Bilateral nipple shadows are  seen.  The cardiomediastinal silhouette is within normal limits.  No acute osseous abnormalities are seen.  IMPRESSION: Mild peribronchial thickening noted, with mild chronic lung changes; lungs otherwise clear.  Original Report Authenticated By: Tonia Ghent, M.D.     1. COPD exacerbation       MDM  History of COPD noncompliance, frequent ED visits with frequently leaving AGAINST MEDICAL ADVICE presenting with difficulty breathing for the past 2 days. Associated with cough and wheezing.  Moderate respiratory distress with poor air exchange, tight inspiratory and expiratory wheezing bilaterally.  Given hour long neb, solumedrol, magnesium, IVF. CXR negative. Hemoglobin stable (hx AVM bleed in past).  Persistent tight wheezing and moderate distress. No hypoxia.   Continuous neb, admission d/w Dr. Orvan Falconer   Date: 05/09/2012  Rate: 89  Rhythm: normal sinus rhythm and premature atrial contractions (PAC)  QRS Axis: normal  Intervals: normal  ST/T Wave abnormalities: normal  Conduction Disutrbances:none  Narrative Interpretation: Stable septal Q waves, ST depressions resolved inferiorly  Old EKG Reviewed: unchanged  CRITICAL CARE Performed by: Glynn Octave   Total critical care time: 30  Critical care time was exclusive of separately billable procedures and treating other patients.  Critical care was necessary to treat or prevent imminent or life-threatening deterioration.  Critical care was time spent personally by me on the following activities: development of treatment plan  with patient and/or surrogate as well as nursing, discussions with consultants, evaluation of patient's response to treatment, examination of patient, obtaining history from patient or surrogate, ordering and performing treatments and interventions, ordering and review of laboratory studies, ordering and review of radiographic studies, pulse oximetry and re-evaluation of patient's condition. 3 I  personally performed the services described in this documentation, which was scribed in my presence.  The recorded information has been reviewed and considered.    Glynn Octave, MD 05/10/12 [phone]

## 2012-05-09 NOTE — ED Notes (Signed)
MD at bedside. 

## 2012-05-09 NOTE — ED Notes (Signed)
CRITICAL VALUE ALERT  Critical value received:  CK MB=6.5  Date of notification:  04/09/12  Time of notification:  2338  Critical value read back:yes  Nurse who received alert:  Jinny Sanders, RN  MD notified (1st page):    Time of first page:    MD notified (2nd page):  Time of second page:  Responding MD:  Dr. Manus Gunning verbally notified in department.  Time MD responded:

## 2012-05-09 NOTE — ED Notes (Signed)
Sob since yesterday and reported that inhaler is not working properly

## 2012-05-10 ENCOUNTER — Encounter (HOSPITAL_COMMUNITY): Payer: Self-pay | Admitting: *Deleted

## 2012-05-10 DIAGNOSIS — J441 Chronic obstructive pulmonary disease with (acute) exacerbation: Secondary | ICD-10-CM | POA: Diagnosis present

## 2012-05-10 LAB — BASIC METABOLIC PANEL
BUN: 11 mg/dL (ref 6–23)
CO2: 19 mEq/L (ref 19–32)
Chloride: 104 mEq/L (ref 96–112)
Creatinine, Ser: 0.76 mg/dL (ref 0.50–1.35)
GFR calc Af Amer: >90 mL/min (ref >90)
Glucose, Bld: 197 mg/dL — ABNORMAL HIGH (ref 70–99)
Potassium: 4.1 mEq/L (ref 3.5–5.1)

## 2012-05-10 LAB — CBC
HCT: 31.5 % — ABNORMAL LOW (ref 39.0–52.0)
Hemoglobin: 10 g/dL — ABNORMAL LOW (ref 13.0–17.0)
MCH: 27.9 pg (ref 26.0–34.0)
MCHC: 31.7 g/dL (ref 30.0–36.0)
MCV: 88 fL (ref 78.0–100.0)
RDW: 20.5 % — ABNORMAL HIGH (ref 11.5–15.5)

## 2012-05-10 LAB — BLOOD GAS, ARTERIAL
Bicarbonate: 22.9 mEq/L (ref 20.0–24.0)
O2 Saturation: 91 %
pCO2 arterial: 41.5 mmHg (ref 35.0–45.0)
pH, Arterial: 7.361 (ref 7.350–7.450)
pO2, Arterial: 65.8 mmHg — ABNORMAL LOW (ref 80.0–100.0)

## 2012-05-10 LAB — PROTIME-INR: Prothrombin Time: 13.7 seconds (ref 11.6–15.2)

## 2012-05-10 MED ORDER — HALOPERIDOL LACTATE 5 MG/ML IJ SOLN
5.0000 mg | Freq: Once | INTRAMUSCULAR | Status: AC
  Administered 2012-05-10: 5 mg via INTRAVENOUS
  Filled 2012-05-10: qty 1

## 2012-05-10 MED ORDER — MOXIFLOXACIN HCL IN NACL 400 MG/250ML IV SOLN
400.0000 mg | INTRAVENOUS | Status: DC
  Filled 2012-05-10: qty 250

## 2012-05-10 MED ORDER — ALBUTEROL SULFATE (5 MG/ML) 0.5% IN NEBU
2.5000 mg | INHALATION_SOLUTION | RESPIRATORY_TRACT | Status: DC
  Administered 2012-05-10 (×5): 2.5 mg via RESPIRATORY_TRACT
  Filled 2012-05-10 (×5): qty 0.5

## 2012-05-10 MED ORDER — POLYETHYLENE GLYCOL 3350 17 G PO PACK: 17.0000 g | PACK | Freq: Every day | ORAL | Status: DC | PRN

## 2012-05-10 MED ORDER — METHYLPREDNISOLONE SODIUM SUCC 125 MG IJ SOLR
125.0000 mg | Freq: Four times a day (QID) | INTRAMUSCULAR | Status: DC
  Administered 2012-05-10 (×3): 125 mg via INTRAVENOUS
  Filled 2012-05-10 (×3): qty 2

## 2012-05-10 MED ORDER — ASPIRIN 81 MG PO CHEW
81.0000 mg | CHEWABLE_TABLET | Freq: Once | ORAL | Status: AC
  Administered 2012-05-10: 81 mg via ORAL
  Filled 2012-05-10: qty 1

## 2012-05-10 MED ORDER — ONDANSETRON HCL 4 MG/2ML IJ SOLN: 4.0000 mg | INTRAMUSCULAR | Status: DC | PRN

## 2012-05-10 MED ORDER — GUAIFENESIN ER 600 MG PO TB12
1200.0000 mg | ORAL_TABLET | Freq: Two times a day (BID) | ORAL | Status: DC
  Administered 2012-05-10 (×2): 1200 mg via ORAL
  Filled 2012-05-10 (×6): qty 2

## 2012-05-10 MED ORDER — FERROUS SULFATE 325 (65 FE) MG PO TABS
325.0000 mg | ORAL_TABLET | Freq: Three times a day (TID) | ORAL | Status: DC
  Administered 2012-05-10 (×2): 325 mg via ORAL
  Filled 2012-05-10 (×7): qty 1

## 2012-05-10 MED ORDER — IPRATROPIUM BROMIDE 0.02 % IN SOLN
0.5000 mg | RESPIRATORY_TRACT | Status: DC
  Administered 2012-05-10 (×5): 0.5 mg via RESPIRATORY_TRACT
  Filled 2012-05-10 (×5): qty 2.5

## 2012-05-10 MED ORDER — POTASSIUM CHLORIDE IN NACL 20-0.9 MEQ/L-% IV SOLN
INTRAVENOUS | Status: DC
  Administered 2012-05-10: 13:00:00 via INTRAVENOUS

## 2012-05-10 MED ORDER — BISACODYL 5 MG PO TBEC: 5.0000 mg | DELAYED_RELEASE_TABLET | Freq: Every day | ORAL | Status: DC | PRN

## 2012-05-10 MED ORDER — TRAZODONE HCL 50 MG PO TABS
25.0000 mg | ORAL_TABLET | Freq: Every evening | ORAL | Status: DC | PRN
  Filled 2012-05-10: qty 1

## 2012-05-10 MED ORDER — HYDROMORPHONE HCL PF 1 MG/ML IJ SOLN
0.5000 mg | INTRAMUSCULAR | Status: DC | PRN
  Administered 2012-05-10: 0.5 mg via INTRAVENOUS
  Filled 2012-05-10: qty 1

## 2012-05-10 MED ORDER — ALBUTEROL SULFATE HFA 108 (90 BASE) MCG/ACT IN AERS: 2.0000 | INHALATION_SPRAY | RESPIRATORY_TRACT | Status: DC | PRN

## 2012-05-10 MED ORDER — ACETAMINOPHEN 325 MG PO TABS: 650.0000 mg | ORAL_TABLET | Freq: Four times a day (QID) | ORAL | Status: DC | PRN

## 2012-05-10 MED ORDER — ALBUTEROL SULFATE (5 MG/ML) 0.5% IN NEBU: 2.5000 mg | INHALATION_SOLUTION | RESPIRATORY_TRACT | Status: DC | PRN

## 2012-05-10 NOTE — Progress Notes (Signed)
*  PRELIMINARY RESULTS* Echocardiogram 2D Echocardiogram has been performed.  [name] 05/10/2012, 2:53 PM

## 2012-05-10 NOTE — H&P (Signed)
[facility] History and Physical  [name] UXL:[id number] DOB: Jan 18, 1930 DOA: 05/09/2012   PCP:   Fredirick Maudlin, MD   Chief Complaint:  shortness of breath for 2 days  HPI: [name] is an 78 y.o. male.   Elderly African American gentleman with a history of schizophrenia and alcohol abuse, who makes frequent trips to the emergency room for various complaints, but usually signed out AGAINST MEDICAL ADVICE prior to treatment, comes in today with a history of 2 days of shortness of breath despite the use of home albuterol. She nebulizations by EMS prior to arrival, and received an hour-long treatment in the emergency room, but continues to have significant shortness of breath and the hospitalist service was called to assist with management.  The patient is a difficult historian, he admits to drinking no more than 2 beers per day, he denies fever or productive cough, denies chest pain or lower extremity edema.  He reports that he does not see a psychiatrist's nor take psychotropic medication  Patient has a history of episodic GI bleeding due to AV malformations, but apparently has not been cooperative with definitive management. He reports the last episode of melena stools about 2 weeks ago  Rewiew of Systems:  The patient denies anorexia, fever, weight loss,, vision loss, decreased hearing, hoarseness, chest pain, syncope,  peripheral edema, balance deficits, hemoptysis, abdominal pain, melena, hematochezia, severe indigestion/heartburn, hematuria, incontinence, genital sores, muscle weakness, suspicious skin lesions, transient blindness, difficulty walking, depression, unusual weight change, abnormal bleeding, enlarged lymph nodes, angioedema, and breast masses.*   Past Medical History  Diagnosis Date  . COPD (chronic obstructive pulmonary disease)   . Anxiety   . Alcohol abuse   . Anemia   . Generalized headaches   . Hypertension   . AV malformation of GI tract    AVMs the ascending colon just distal to the ICV, BICAP 2009  . Pulmonary nodule/lesion, solitary   . Schizoaffective disorder   . GI bleed     History of recurrent bleeding that dates back as far as 2004 per records,    Past Surgical History  Procedure Date  . No past surgeries   . Colonoscopy Sept 2009    SLF: few small AVMs in ascending colon distal to IC . Ablated via BICAP.   Marland Kitchen Esophagogastroduodenoscopy Sept 2009    SLF: normal esophagus, small HH, 1-2 AVMs actively oozing in mid stomach, s/p BICAP, path benign   . Esophagogastroduodenoscopy 01/18/2012    Rourk-Erosive reflux esophagitis. Noncritical ring. Antral erosions/ small antral ulcer - appeared innocent  (H pylori serrology negative)  . Colonoscopy 03/09/2012    Procedure: COLONOSCOPY;  Surgeon: West Bali, MD;  Location: [facility];  Service: Endoscopy;  Laterality: N/A;  2:30  . Esophagogastroduodenoscopy 03/09/2012    Procedure: ESOPHAGOGASTRODUODENOSCOPY (EGD);  Surgeon: West Bali, MD;  Location: [facility];  Service: Endoscopy;  Laterality: N/A;    Medications:  HOME MEDS: Prior to Admission medications   Medication Sig Start Date End Date Taking? Authorizing Provider  aspirin 325 MG tablet Take 325 mg by mouth every 6 (six) hours as needed. For pain    Historical Provider, MD  ferrous sulfate 325 (65 FE) MG tablet Take 1 tablet (325 mg total) by mouth 3 (three) times daily with meals. 04/04/12 04/04/13  Carleene Cooper III, MD  predniSONE (DELTASONE) 50 MG tablet Take 1 tablet (50 mg total) by mouth daily. 04/26/12   Dione Booze, MD  Allergies:  Allergies  Allergen Reactions  . Black Pepper (Piper Nigrum) Itching    Social History:   reports that he quit smoking about 43 years ago. His smokeless tobacco use includes Snuff. He reports that he drinks about 2.4 ounces of alcohol per week. He reports that he does not use illicit drugs.  Family History: Family History  Problem Relation Age of Onset    . Colon cancer Neg Hx      Physical Exam: Filed Vitals:   05/09/12 2230 05/09/12 2244 05/09/12 2245 05/09/12 2310  BP: 105/67  104/65 110/68  Pulse: 93  85   Temp:      TempSrc:      Resp:   22 22  Height:      Weight:      SpO2: 95% 94% 92% 96%   Blood pressure 110/68, pulse 85, temperature 98.4 F (36.9 C), temperature source Oral, resp. rate 22, height 5\' 9"  (1.753 m), weight 61.236 kg (135 lb), SpO2 96.00%.  GEN:  Pleasant elderly African American gentleman sitting up in the stretcher receiving nebulization; cooperative with exam PSYCH:  alert and oriented x3; does  appear anxious; affect is appropriate. HEENT: Mucous membranes pink and anicteric; PERRLA; EOM intact; no cervical lymphadenopathy nor thyromegaly or carotid bruit; distended external jugular veins Breasts:: Not examined CHEST WALL: No tenderness CHEST: Tachypneic, diffuse bilateral rhonchi HEART: Regular rate and rhythm; no murmurs rubs or gallops BACK: No kyphosis or scoliosis; no CVA tenderness ABDOMEN: , soft non-tender; no masses, no organomegaly, normal abdominal bowel sounds; no pannus; no intertriginous candida. Rectal Exam: Not done EXTREMITIES:  age-appropriate arthropathy of the hands and knees; no edema; no ulcerations. Genitalia: not examined PULSES: 2+ and symmetric SKIN: Normal hydration no rash or ulceration CNS: Cranial nerves 2-12 grossly intact no focal lateralizing neurologic deficit   Labs & Imaging Results for orders placed during the hospital encounter of 05/09/12 (from the past 48 hour(s))  CBC WITH DIFFERENTIAL     Status: Abnormal   Collection Time   05/09/12 10:45 PM      Component Value Range Comment   WBC 3.8 (*) 4.0 - 10.5 K/uL    RBC 3.71 (*) 4.22 - 5.81 MIL/uL    Hemoglobin 10.3 (*) 13.0 - 17.0 g/dL    HCT 16.1 (*) 09.6 - 52.0 %    MCV 87.6  78.0 - 100.0 fL    MCH 27.8  26.0 - 34.0 pg    MCHC 31.7  30.0 - 36.0 g/dL    RDW 04.5 (*) 40.9 - 15.5 %    Platelets 396  150 -  400 K/uL    Neutrophils Relative 41 (*) 43 - 77 %    Lymphocytes Relative 36  12 - 46 %    Monocytes Relative 15 (*) 3 - 12 %    Eosinophils Relative 7 (*) 0 - 5 %    Basophils Relative 1  0 - 1 %    Neutro Abs 1.5 (*) 1.7 - 7.7 K/uL    Lymphs Abs 1.4  0.7 - 4.0 K/uL    Monocytes Absolute 0.6  0.1 - 1.0 K/uL    Eosinophils Absolute 0.3  0.0 - 0.7 K/uL    Basophils Absolute 0.0  0.0 - 0.1 K/uL    RBC Morphology SCHISTOCYTES PRESENT (2-5/hpf)      WBC Morphology TOXIC GRANULATION      Smear Review LARGE PLATELETS PRESENT     CARDIAC PANEL(CRET KIN+CKTOT+MB+TROPI)     Status:  Abnormal   Collection Time   05/09/12 10:45 PM      Component Value Range Comment   Total CK 221  7 - 232 U/L    CK, MB 6.5 (*) 0.3 - 4.0 ng/mL    Troponin I <0.30  <0.30 ng/mL    Relative Index 2.9 (*) 0.0 - 2.5   COMPREHENSIVE METABOLIC PANEL     Status: Abnormal   Collection Time   05/09/12 10:45 PM      Component Value Range Comment   Sodium 137  135 - 145 mEq/L    Potassium 3.6  3.5 - 5.1 mEq/L    Chloride 103  96 - 112 mEq/L    CO2 23  19 - 32 mEq/L    Glucose, Bld 111 (*) 70 - 99 mg/dL    BUN 10  6 - 23 mg/dL    Creatinine, Ser 4.40  0.50 - 1.35 mg/dL    Calcium 9.7  8.4 - 10.2 mg/dL    Total Protein 6.7  6.0 - 8.3 g/dL    Albumin 3.3 (*) 3.5 - 5.2 g/dL    AST 31  0 - 37 U/L    ALT 11  0 - 53 U/L    Alkaline Phosphatase 74  39 - 117 U/L    Total Bilirubin 0.2 (*) 0.3 - 1.2 mg/dL    GFR calc non Af Amer 83 (*) >90 mL/min    GFR calc Af Amer >90  >90 mL/min   BLOOD GAS, ARTERIAL     Status: Abnormal   Collection Time   05/09/12 10:50 PM      Component Value Range Comment   O2 Content 2.0      Delivery systems NASAL CANNULA      pH, Arterial 7.361  7.350 - 7.450    pCO2 arterial 41.5  35.0 - 45.0 mmHg    pO2, Arterial 65.8 (*) 80.0 - 100.0 mmHg    Bicarbonate 22.9  20.0 - 24.0 mEq/L    TCO2 21.4  0 - 100 mmol/L    Acid-base deficit 1.7  0.0 - 2.0 mmol/L    O2 Saturation 91.0      Patient  temperature 37.0      Collection site RIGHT RADIAL      Drawn by [phone]      Sample type ARTERIAL      Allens test (pass/fail) PASS  PASS    Dg Chest Portable 1 View  05/09/2012  *RADIOLOGY REPORT*  Clinical Data: Shortness of breath; history of smoking.  PORTABLE CHEST - 1 VIEW  Comparison: Chest radiograph performed 04/26/2012  Findings: The lungs are well-aerated.  Mild peribronchial thickening is noted, with mild chronic lung changes.  There is no evidence of pleural effusion or pneumothorax.  Minimal scarring is noted at the lung apices.  Bilateral nipple shadows are seen.  The cardiomediastinal silhouette is within normal limits.  No acute osseous abnormalities are seen.  IMPRESSION: Mild peribronchial thickening noted, with mild chronic lung changes; lungs otherwise clear.  Original Report Authenticated By: Tonia Ghent, M.D.      Assessment Present on Admission:  .COPD exacerbation .Alcoholism, chronic .Anemia Tobacco abuse (snuff)  PLAN: Admit this gentleman for treatment of COPD exacerbation; will give serial nebulizations, intravenous steroids; and quinolone antibiotics.   We'll give bedtime Haldol, as patient is beginning to be resistant to being treated by the nurses. She does not give the impression of being intoxicated and reports to see a severe reduction in  his alcohol use, will not place on a withdrawal protocol condition.  Nicotine replacement on counseling on tobacco cessation.  Continue iron supplements given his history of episodic GI bleed, and current anemia.  Other plans as per orders.  His primary care physician will assume care later this morning.  [name] 05/10/2012, 12:17 AM

## 2012-05-10 NOTE — Progress Notes (Signed)
On arrival to room @ 1930 patient appears agitated, out of bed multiple times. Patient sob with expiratory wheezing  Refusing to keep his oxygen on.  Patient stating at this time that he  Will  remove his" IV  any way he has to" .  Patient reached into his pocket and pulled out a pocket knife which he attempts to cut his hep lock out of his arm. I asked patient to surrender his knife he angrily refuses, and states"he will do what he has to do to leave". Security and [facility] paged stat to room. Dr Graylon Good    Called and given an update, states that patient can leave AMA if he chooses. Patient  sign out AMA and was discharged home.

## 2012-05-11 NOTE — Progress Notes (Signed)
UR chart review completed.  

## 2012-05-11 NOTE — Progress Notes (Signed)
NAMEABRAN, GAVIGAN NO.:  [id number]  MEDICAL RECORD NO.:  [id number]  LOCATION:                                 FACILITY:  PHYSICIAN:  Lateka Rady L. Juanetta Gosling, M.D.DATE OF BIRTH:  12-06-1929  DATE OF PROCEDURE:  05/10/2012 DATE OF DISCHARGE:  05/10/2012                                PROGRESS NOTE   SUBJECTIVE:  [name] was admitted last night with COPD exacerbation.  He has severe problems with COPD and has had that for some time.  He has been treated with inhaled bronchodilators and with long-acting bronchodilators.  He tends to over use his bronchodilator medications and runs out of his inhaler.  He was in my office on the day of admission, requesting a new inhaler and I gave him a prescription.  I am not certain if he had that filled or not.  He says he went home and started mowing his grass and developed increasing shortness of breath, cough and congestion.  He called for help with EMS and was brought to the emergency room and was admitted from there.  PHYSICAL EXAMINATION:  GENERAL:  He is awake and alert and looks very comfortable right now. HEENT:  His pupils are reactive.  His nose and throat are clear. NECK:  Supple without masses. CHEST:  Clear. HEART:  Regular. ABDOMEN:  Soft. EXTREMITIES:  No edema. CENTRAL NERVOUS SYSTEM:  He is anxious.  ASSESSMENT:  He has chronic obstructive pulmonary disease.  He has a significant history of alcoholism and he says he stopped drinking some time ago, but I am not certain that is accurate.  PLAN:  My plan is for him to be admitted.  He is on IV antibiotics, IV steroids.  In the past, he has typically stayed in the hospital for a day or so and then checked out against medical advice.  Hopefully, he will stay for treatment.  I have asked for case management to see him as well.     Thorvald Orsino L. Juanetta Gosling, M.D.     ELH/MEDQ  D:  05/10/2012  T:  05/10/2012  Job:  [id number]

## 2012-05-15 NOTE — Discharge Summary (Signed)
Physician Discharge Summary  Patient ID: [name] MRN: [id number] DOB/AGE: Apr 24, 1930 78 y.o. Primary Care Physician:Naydene Kamrowski L, MD Admit date: 05/09/2012 Discharge date: 05/15/2012    Discharge Diagnoses:   Active Problems:  Anemia  AV malformation of gastrointestinal tract  Alcoholism, chronic  COPD exacerbation   Medication List  As of 05/15/2012  2:22 PM   ASK your doctor about these medications         aspirin 325 MG tablet   Take 325 mg by mouth every 6 (six) hours as needed. For pain      ferrous sulfate 325 (65 FE) MG tablet   Take 1 tablet (325 mg total) by mouth 3 (three) times daily with meals.      predniSONE 50 MG tablet   Commonly known as: DELTASONE   Take 1 tablet (50 mg total) by mouth daily.            Discharged Condition:unchanged    Consults:none  Significant Diagnostic Studies: Dg Chest 2 View  04/27/2012  *RADIOLOGY REPORT*  Clinical Data: 78 year old male with shortness of breath.  CHEST - 2 VIEW  Comparison: 04/01/2012 and earlier.  Findings: Increased upper lobe predominant interstitial and hazy markings, worse in the right upper lobe.  These do not seem to be technique related.  Stable lung volumes.  Stable cardiac size and mediastinal contours.  No pneumothorax, pulmonary edema or pleural effusion.  IMPRESSION: Increased indistinct and interstitial upper lobe opacity greater on the right, not felt to be related to technical artifact, compatible with acute respiratory infection superimposed on chronic lung disease.  Original Report Authenticated By: Harley Hallmark, M.D.   Dg Chest Portable 1 View  05/09/2012  *RADIOLOGY REPORT*  Clinical Data: Shortness of breath; history of smoking.  PORTABLE CHEST - 1 VIEW  Comparison: Chest radiograph performed 04/26/2012  Findings: The lungs are well-aerated.  Mild peribronchial thickening is noted, with mild chronic lung changes.  There is no evidence of pleural effusion or pneumothorax.  Minimal  scarring is noted at the lung apices.  Bilateral nipple shadows are seen.  The cardiomediastinal silhouette is within normal limits.  No acute osseous abnormalities are seen.  IMPRESSION: Mild peribronchial thickening noted, with mild chronic lung changes; lungs otherwise clear.  Original Report Authenticated By: Tonia Ghent, M.D.    Lab Results: Basic Metabolic Panel: No results found for this basename: NA:2,K:2,CL:2,CO2:2,GLUCOSE:2,BUN:2,CREATININE:2,CALCIUM:2,MG:2,PHOS:2 in the last 72 hours Liver Function Tests: No results found for this basename: AST:2,ALT:2,ALKPHOS:2,BILITOT:2,PROT:2,ALBUMIN:2 in the last 72 hours   CBC: No results found for this basename: WBC:2,NEUTROABS:2,HGB:2,HCT:2,MCV:2,PLT:2 in the last 72 hours  No results found for this or any previous visit (from the past 240 hour(s)).   Hospital Course: he was admitted for COPD exacerbation. He has been overusing his rescue inhaler. He was started on intravenous antibiotics steroids and inhaled bronchodilators. He checked out AGAINST MEDICAL ADVICE after approximately 36 hours.  Discharge Exam: Blood pressure 121/68, pulse 76, temperature 98.2 F (36.8 C), temperature source Oral, resp. rate 20, height 5\' 9"  (1.753 m), weight 54.84 kg (120 lb 14.4 oz), SpO2 95.00%. unknown  Disposition: home AGAINST MEDICAL ADVICE      Si Meyah Corle Elbert Ewings Pager [phone]  05/15/2012, 2:22 PM

## 2012-06-09 ENCOUNTER — Other Ambulatory Visit: Payer: Self-pay

## 2012-06-09 ENCOUNTER — Encounter (HOSPITAL_COMMUNITY): Payer: Self-pay | Admitting: *Deleted

## 2012-06-09 ENCOUNTER — Emergency Department (HOSPITAL_COMMUNITY)
Admission: EM | Admit: 2012-06-09 | Discharge: 2012-06-09 | Disposition: A | Discharge status: Home / Self Care | Payer: PRIVATE HEALTH INSURANCE | Attending: Emergency Medicine | Admitting: Emergency Medicine

## 2012-06-09 DIAGNOSIS — D649 Anemia, unspecified: Secondary | ICD-10-CM | POA: Insufficient documentation

## 2012-06-09 DIAGNOSIS — Z87891 Personal history of nicotine dependence: Secondary | ICD-10-CM | POA: Insufficient documentation

## 2012-06-09 DIAGNOSIS — R000 Tachycardia, unspecified: Secondary | ICD-10-CM

## 2012-06-09 DIAGNOSIS — J449 Chronic obstructive pulmonary disease, unspecified: Secondary | ICD-10-CM | POA: Insufficient documentation

## 2012-06-09 DIAGNOSIS — R079 Chest pain, unspecified: Secondary | ICD-10-CM | POA: Insufficient documentation

## 2012-06-09 DIAGNOSIS — I10 Essential (primary) hypertension: Secondary | ICD-10-CM | POA: Insufficient documentation

## 2012-06-09 DIAGNOSIS — J4489 Other specified chronic obstructive pulmonary disease: Secondary | ICD-10-CM | POA: Insufficient documentation

## 2012-06-09 DIAGNOSIS — F259 Schizoaffective disorder, unspecified: Secondary | ICD-10-CM | POA: Insufficient documentation

## 2012-06-09 NOTE — ED Notes (Signed)
Pt c/o fast heartbeat, started this am, denies any pain,

## 2012-06-09 NOTE — ED Provider Notes (Signed)
History  This chart was scribed for Donnetta Hutching, MD by Bennett Scrape. This patient was seen in room [facility] and the patient's care was started at 11:19AM.  CSN: [id number]  Arrival date & time 06/09/12  1058   First MD Initiated Contact with Patient 06/09/12 1119      Chief Complaint  Patient presents with  . Tachycardia    The history is provided by the patient. No language interpreter was used.    [name] is a 78 y.o. male who presents to the Emergency Department complaining of one hour of sudden onset, gradually improving, constant tachycardia that started while he was sitting down. Pt reports that symptoms have improved while he was driving his riding lawn mower up here to the ED. He denies having any modifying factors. He denies having prior episodes of similar symptoms. He denies fever, neck pain, visual disturbance, CP, SOB, abdominal pain, urinary symptoms, back pain, HA, and rash as associated symptoms. He has a h/o COPD, anxiety, HTN and anemia. He is an occasional alcohol user and is a former smoker.  He is a frequent visitor to this ED for SOB symptoms.   Past Medical History  Diagnosis Date  . COPD (chronic obstructive pulmonary disease)   . Anxiety   . Alcohol abuse   . Anemia   . Generalized headaches   . Hypertension   . 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 Pulmonary nodule/lesion, solitary   . Schizoaffective disorder   . GI bleed     History of recurrent bleeding that dates back as far as 2004 per records,    Past Surgical History  Procedure Date  . No past surgeries   . Colonoscopy Sept 2009    SLF: few small AVMs in ascending colon distal to IC . Ablated via BICAP.   Marland Kitchen Esophagogastroduodenoscopy Sept 2009    SLF: normal esophagus, small HH, 1-2 AVMs actively oozing in mid stomach, s/p BICAP, path benign   . Esophagogastroduodenoscopy 01/18/2012    Rourk-Erosive reflux esophagitis.  Noncritical ring. Antral erosions/ small antral ulcer - appeared innocent  (H pylori serrology negative)  . Colonoscopy 03/09/2012    Procedure: COLONOSCOPY;  Surgeon: West Bali, MD;  Location: [facility];  Service: Endoscopy;  Laterality: N/A;  2:30  . Esophagogastroduodenoscopy 03/09/2012    Procedure: ESOPHAGOGASTRODUODENOSCOPY (EGD);  Surgeon: West Bali, MD;  Location: [facility];  Service: Endoscopy;  Laterality: N/A;    Family History  Problem Relation Age of Onset  . Colon cancer Neg Hx     History  Substance Use Topics  . Smoking status: Former Smoker    Quit date: 05/07/1969  . Smokeless tobacco: Current User    Types: Snuff  . Alcohol Use: 2.4 oz/week    4 Cans of beer per week     ETOH-pt states quit 12/2011      Review of Systems  A complete 10 system review of systems was obtained and all systems are negative except as noted in the HPI and PMH.   Allergies  Black pepper  Home Medications   Current Outpatient Rx  Name Route Sig Dispense Refill  . ASPIRIN 325 MG PO TABS Oral Take 325 mg by mouth every 6 (six) hours as needed. For pain    . FERROUS SULFATE 325 (65 FE) MG PO TABS Oral Take 1 tablet (325 mg total) by mouth 3 (  three) times daily with meals. 100 tablet 0  . PREDNISONE 50 MG PO TABS Oral Take 1 tablet (50 mg total) by mouth daily. 5 tablet 0    Triage Vitals: BP 110/64  Pulse 110  Temp 98.6 F (37 C)  Resp 20  SpO2 95%  Physical Exam  Nursing note and vitals reviewed. Constitutional: He is oriented to person, place, and time. He appears well-developed and well-nourished. No distress.  HENT:  Head: Normocephalic and atraumatic.  Eyes: EOM are normal.  Neck: Neck supple. No tracheal deviation present.  Cardiovascular: Normal rate and regular rhythm.   Pulmonary/Chest: Effort normal and breath sounds normal. No respiratory distress.  Abdominal: Soft. He exhibits no distension.  Musculoskeletal: Normal range of motion.    Neurological: He is alert and oriented to person, place, and time.  Skin: Skin is warm and dry. No rash noted.  Psychiatric: He has a normal mood and affect. His behavior is normal.    ED Course  Procedures (including critical care time)  DIAGNOSTIC STUDIES: Oxygen Saturation is 95% on room air, adequate by my interpretation.    COORDINATION OF CARE: 11:58AM-Pt states that symptoms have improved since his arrival and he wants to go home. Discussed discharge plan with pt at bedside and pt agreed to plan.   Labs Reviewed - No data to display No results found.   No diagnosis found.   Date: 06/09/2012  Rate: 97  Rhythm: normal sinus rhythm  QRS Axis: normal  Intervals: normal  ST/T Wave abnormalities: normal  Conduction Disutrbances: none  Narrative Interpretation: unremarkable     MDM  Physical exam normal. Vital signs normal. No further testing necessary    I personally performed the services described in this documentation, which was scribed in my presence. The recorded information has been reviewed and considered.      Donnetta Hutching, MD 06/09/12 1213

## 2012-06-19 ENCOUNTER — Encounter (HOSPITAL_COMMUNITY): Payer: Self-pay | Admitting: *Deleted

## 2012-06-19 ENCOUNTER — Emergency Department (HOSPITAL_COMMUNITY): Payer: PRIVATE HEALTH INSURANCE

## 2012-06-19 ENCOUNTER — Emergency Department (HOSPITAL_COMMUNITY)
Admission: EM | Admit: 2012-06-19 | Discharge: 2012-06-19 | Discharge status: Against Medical Advice | Payer: PRIVATE HEALTH INSURANCE | Attending: Emergency Medicine | Admitting: Emergency Medicine

## 2012-06-19 DIAGNOSIS — J441 Chronic obstructive pulmonary disease with (acute) exacerbation: Secondary | ICD-10-CM | POA: Insufficient documentation

## 2012-06-19 DIAGNOSIS — I10 Essential (primary) hypertension: Secondary | ICD-10-CM | POA: Insufficient documentation

## 2012-06-19 DIAGNOSIS — F411 Generalized anxiety disorder: Secondary | ICD-10-CM | POA: Insufficient documentation

## 2012-06-19 DIAGNOSIS — Z8659 Personal history of other mental and behavioral disorders: Secondary | ICD-10-CM | POA: Insufficient documentation

## 2012-06-19 DIAGNOSIS — Z7982 Long term (current) use of aspirin: Secondary | ICD-10-CM | POA: Insufficient documentation

## 2012-06-19 DIAGNOSIS — Z87891 Personal history of nicotine dependence: Secondary | ICD-10-CM | POA: Insufficient documentation

## 2012-06-19 MED ORDER — ALBUTEROL SULFATE HFA 108 (90 BASE) MCG/ACT IN AERS
2.0000 | INHALATION_SPRAY | RESPIRATORY_TRACT | Status: AC
  Administered 2012-06-19: 2 via RESPIRATORY_TRACT
  Filled 2012-06-19: qty 6.7

## 2012-06-19 MED ORDER — ALBUTEROL SULFATE (5 MG/ML) 0.5% IN NEBU
5.0000 mg | INHALATION_SOLUTION | Freq: Once | RESPIRATORY_TRACT | Status: AC
  Administered 2012-06-19: 5 mg via RESPIRATORY_TRACT
  Filled 2012-06-19: qty 1

## 2012-06-19 MED ORDER — IPRATROPIUM BROMIDE 0.02 % IN SOLN
0.5000 mg | Freq: Once | RESPIRATORY_TRACT | Status: AC
  Administered 2012-06-19: 0.5 mg via RESPIRATORY_TRACT
  Filled 2012-06-19: qty 2.5

## 2012-06-19 MED ORDER — PREDNISONE 20 MG PO TABS
60.0000 mg | ORAL_TABLET | Freq: Once | ORAL | Status: AC
Start: 2012-06-19 — End: 2012-06-19
  Administered 2012-06-19: 60 mg via ORAL
  Filled 2012-06-19: qty 3

## 2012-06-19 NOTE — ED Notes (Signed)
Sob x 1 week, ran out of ventolin.

## 2012-06-19 NOTE — ED Provider Notes (Signed)
History     CSN: [id number]  Arrival date & time 06/19/12  1259   First MD Initiated Contact with Patient 06/19/12 1342      Chief Complaint  Patient presents with  . Shortness of Breath     HPI Pt was seen at 1415.  Per pt, c/o gradual onset and persistence of constant SOB and "wheezing" for the past 1 week.  Pt states his symptoms began after he ran out of his MDI.  States he came to the ED today to "get another puffer."  Denies CP/palpitations, no fevers, no abd pain, no N/V/D. The symptoms have been associated with no other complaints. The patient has a significant history of similar symptoms previously, recently being evaluated for this complaint and multiple prior evals for same.     Past Medical History  Diagnosis Date  . COPD (chronic obstructive pulmonary disease)   . Anxiety   . Alcohol abuse   . Anemia   . Generalized headaches   . Hypertension   . 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 Pulmonary nodule/lesion, solitary   . Schizoaffective disorder   . GI bleed     History of recurrent bleeding that dates back as far as 2004 per records,    Past Surgical History  Procedure Date  . No past surgeries   . Colonoscopy Sept 2009    SLF: few small AVMs in ascending colon distal to IC . Ablated via BICAP.   Marland Kitchen Esophagogastroduodenoscopy Sept 2009    SLF: normal esophagus, small HH, 1-2 AVMs actively oozing in mid stomach, s/p BICAP, path benign   . Esophagogastroduodenoscopy 01/18/2012    Rourk-Erosive reflux esophagitis. Noncritical ring. Antral erosions/ small antral ulcer - appeared innocent  (H pylori serrology negative)  . Colonoscopy 03/09/2012    Procedure: COLONOSCOPY;  Surgeon: West Bali, MD;  Location: [facility];  Service: Endoscopy;  Laterality: N/A;  2:30  . Esophagogastroduodenoscopy 03/09/2012    Procedure: ESOPHAGOGASTRODUODENOSCOPY (EGD);  Surgeon: West Bali, MD;  Location: [facility];  Service: Endoscopy;  Laterality: N/A;    Family History  Problem Relation Age of Onset  . Colon cancer Neg Hx     History  Substance Use Topics  . Smoking status: Former Smoker    Quit date: 05/07/1969  . Smokeless tobacco: Current User    Types: Snuff  . Alcohol Use: 2.4 oz/week    4 Cans of beer per week     ETOH-pt states quit 12/2011      Review of Systems ROS: Statement: All systems negative except as marked or noted in the HPI; Constitutional: Negative for fever and chills. ; ; Eyes: Negative for eye pain, redness and discharge. ; ; ENMT: Negative for ear pain, hoarseness, nasal congestion, sinus pressure and sore throat. ; ; Cardiovascular: Negative for chest pain, palpitations, diaphoresis, and peripheral edema. ; ; Respiratory: +SOB, wheezing. Negative for cough and stridor. ; ; Gastrointestinal: Negative for nausea, vomiting, diarrhea, abdominal pain, blood in stool, hematemesis, jaundice and rectal bleeding. . ; ; Genitourinary: Negative for dysuria, flank pain and hematuria. ; ; Musculoskeletal: Negative for back pain and neck pain. Negative for swelling and trauma.; ; Skin: Negative for pruritus, rash, abrasions, blisters, bruising and skin lesion.; ; Neuro: Negative for headache, lightheadedness and neck stiffness. Negative for weakness, altered level of consciousness , altered mental status, extremity weakness, paresthesias, involuntary movement,  seizure and syncope.       Allergies  Black pepper  Home Medications   Current Outpatient Rx  Name Route Sig Dispense Refill  . ASPIRIN 325 MG PO TABS Oral Take 325 mg by mouth every 6 (six) hours as needed. For pain    . FERROUS SULFATE 325 (65 FE) MG PO TABS Oral Take 1 tablet (325 mg total) by mouth 3 (three) times daily with meals. 100 tablet 0  . PREDNISONE 50 MG PO TABS Oral Take 1 tablet (50 mg total) by mouth daily. 5 tablet 0    BP 103/87  Pulse 72  Temp 98.1 F (36.7 C) (Oral)  Resp 20  Wt 123 lb  (55.792 kg)  SpO2 97%  Physical Exam 1420: Physical examination:  Nursing notes reviewed; Vital signs and O2 SAT reviewed;  Constitutional: Well developed, Well nourished, Well hydrated, In no acute distress; Head:  Normocephalic, atraumatic; Eyes: EOMI, PERRL, No scleral icterus; ENMT: Mouth and pharynx normal, Mucous membranes moist; Neck: Supple, Full range of motion, No lymphadenopathy; Cardiovascular: Regular rate and rhythm, No gallop; Respiratory: Breath sounds diminished & equal bilaterally, insp/exp wheezes bilat, faint audible wheeze.  Speaking long phrases, Normal respiratory effort/excursion; Chest: Nontender, Movement normal; Abdomen: Soft, Nontender, Nondistended, Normal bowel sounds;; Extremities: Pulses normal, No tenderness, No edema, No calf edema or asymmetry.; Neuro: AA&Ox3, Major CN grossly intact.  Speech clear. No gross focal motor or sensory deficits in extremities.; Skin: Color normal, Warm, Dry, no rash.   ED Course  Procedures    MDM  MDM Reviewed: nursing note, vitals and previous chart Reviewed previous: ECG Interpretation: ECG and x-ray      Date: 06/19/2012  Rate: 70  Rhythm: normal sinus rhythm  QRS Axis: normal  Intervals: normal  ST/T Wave abnormalities: normal  Conduction Disutrbances:none  Narrative Interpretation:   Old EKG Reviewed: unchanged; no significant changes from previous EKG dated 06/09/2012.   Dg Chest Port 1 View 06/19/2012  *RADIOLOGY REPORT*  Clinical Data: Shortness of breath.  History of COPD and hypertension.  PORTABLE CHEST - 1 VIEW  Comparison: 05/09/2012.  Findings: There is diffuse hyperinflation present consistent with COPD.  There are no acute infiltrates and there are no edematous changes.  The heart is normal in size.  There are no mediastinal or hilar abnormalities.  IMPRESSION: Changes of COPD.  No acute findings.   Original Report Authenticated By: Rolla Plate, M.D.      1505:  Pt refuses neb treatment and  prednisone. Pt states he is "only here for a puffer" and "just give me one and I'm leaving."  Pt has long hx of same, ie: refusing proper medical treatment/cooperating with plan of care and leaving AMA.  Pt informed re: need for proper treatment for his dyspnea which appears to be COPD exacerbation, and that I recommend nebulizer treatment(s) and steroid.  Pt refuses to cooperate with staff and has pulled off his monitor leads, BP cuff and hospital gown, stating he is leaving now.  I encouraged pt to stay and receive treatment, continues to refuse.  Pt makes his own medical decisions.  Risks of AMA explained to pt, including, but not limited to:  Increasing respiratory distress, respiratory arrest, stroke, heart attack, cardiac arrythmia ("irregular heart rate/beat"), "passing out," temporary and/or permanent disability, death.  Pt verb understanding and continues to refuse treatment, understanding the consequences of his decision.  I encouraged pt to follow up with his PMD today and return to the ED immediately if symptoms  do not improve, worsen, or for any other concerns.  Pt verb understanding.      Laray Anger, DO 06/21/12 1818

## 2012-06-19 NOTE — ED Notes (Signed)
Discharge instructions reviewed with pt, questions answered. Pt verbalized understanding.  

## 2012-06-19 NOTE — ED Notes (Signed)
Pt stated "I just want another puffer". Audible wheeze detected without auscultation.

## 2012-07-04 ENCOUNTER — Encounter (HOSPITAL_COMMUNITY): Payer: Self-pay | Admitting: Emergency Medicine

## 2012-07-04 ENCOUNTER — Emergency Department (HOSPITAL_COMMUNITY)
Admission: EM | Admit: 2012-07-04 | Discharge: 2012-07-05 | Disposition: A | Discharge status: Home / Self Care | Payer: PRIVATE HEALTH INSURANCE | Attending: Emergency Medicine | Admitting: Emergency Medicine

## 2012-07-04 DIAGNOSIS — J449 Chronic obstructive pulmonary disease, unspecified: Secondary | ICD-10-CM | POA: Insufficient documentation

## 2012-07-04 DIAGNOSIS — F209 Schizophrenia, unspecified: Secondary | ICD-10-CM | POA: Insufficient documentation

## 2012-07-04 DIAGNOSIS — J4489 Other specified chronic obstructive pulmonary disease: Secondary | ICD-10-CM | POA: Insufficient documentation

## 2012-07-04 DIAGNOSIS — F411 Generalized anxiety disorder: Secondary | ICD-10-CM | POA: Insufficient documentation

## 2012-07-04 DIAGNOSIS — R0602 Shortness of breath: Secondary | ICD-10-CM | POA: Insufficient documentation

## 2012-07-04 DIAGNOSIS — Z79899 Other long term (current) drug therapy: Secondary | ICD-10-CM | POA: Insufficient documentation

## 2012-07-04 DIAGNOSIS — I10 Essential (primary) hypertension: Secondary | ICD-10-CM | POA: Insufficient documentation

## 2012-07-04 MED ORDER — ALBUTEROL SULFATE (5 MG/ML) 0.5% IN NEBU
2.5000 mg | INHALATION_SOLUTION | Freq: Once | RESPIRATORY_TRACT | Status: AC
  Administered 2012-07-04: 2.5 mg via RESPIRATORY_TRACT
  Filled 2012-07-04: qty 0.5

## 2012-07-04 NOTE — ED Notes (Signed)
Patient states he had shortness of breath before he mowed yard and presents to ER for wheezing.

## 2012-07-04 NOTE — ED Provider Notes (Signed)
History    This chart was scribed for Shelda Jakes, MD, MD by Smitty Pluck. The patient was seen in room [facility]06 and the patient's care was started at 11:29PM.   CSN: [id number]  Arrival date & time 07/04/12  2204   First MD Initiated Contact with Patient 07/04/12 2329      Chief Complaint  Patient presents with  . Wheezing    (Consider location/radiation/quality/duration/timing/severity/associated sxs/prior treatment) Patient is a 78 y.o. male presenting with wheezing. The history is provided by the patient.  Wheezing  The current episode started today. The onset was sudden. The problem occurs frequently. The problem has been gradually improving. The problem is moderate. Associated symptoms include shortness of breath and wheezing. Pertinent negatives include no chest pain, no fever and no cough.   [name] is a 78 y.o. male who presents to the Emergency Department complaining of constant, moderate SOB and wheezing onset today. Pt reports he mowed the yard today and wheezing started. Pt has hx of COPD and asthma. Pt has had breathing treatment while in ED with relief. Pt uses inhalers at home but has run out of albuterol.   Past Medical History  Diagnosis Date  . COPD (chronic obstructive pulmonary disease)   . Anxiety   . Alcohol abuse   . Anemia   . Generalized headaches   . Hypertension   . 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 Pulmonary nodule/lesion, solitary   . Schizoaffective disorder   . GI bleed     History of recurrent bleeding that dates back as far as 2004 per records,    Past Surgical History  Procedure Date  . Colonoscopy Sept 2009    SLF: few small AVMs in ascending colon distal to IC . Ablated via BICAP.   Marland Kitchen Esophagogastroduodenoscopy Sept 2009    SLF: normal esophagus, small HH, 1-2 AVMs actively oozing in mid stomach, s/p BICAP, path benign   . Esophagogastroduodenoscopy 01/18/2012   Rourk-Erosive reflux esophagitis. Noncritical ring. Antral erosions/ small antral ulcer - appeared innocent  (H pylori serrology negative)  . Colonoscopy 03/09/2012    Procedure: COLONOSCOPY;  Surgeon: West Bali, MD;  Location: [facility];  Service: Endoscopy;  Laterality: N/A;  2:30  . Esophagogastroduodenoscopy 03/09/2012    Procedure: ESOPHAGOGASTRODUODENOSCOPY (EGD);  Surgeon: West Bali, MD;  Location: [facility];  Service: Endoscopy;  Laterality: N/A;    Family History  Problem Relation Age of Onset  . Colon cancer Neg Hx     History  Substance Use Topics  . Smoking status: Former Smoker    Quit date: 05/07/1969  . Smokeless tobacco: Current User    Types: Snuff  . Alcohol Use: 2.4 oz/week    4 Cans of beer per week     ETOH-pt states quit 12/2011      Review of Systems  Constitutional: Negative for fever.  Respiratory: Positive for shortness of breath and wheezing. Negative for cough.   Cardiovascular: Negative for chest pain.  Gastrointestinal: Negative for nausea, vomiting and diarrhea.  All other systems reviewed and are negative.    Allergies  Black pepper  Home Medications   Current Outpatient Rx  Name Route Sig Dispense Refill  . ALBUTEROL SULFATE HFA 108 (90 BASE) MCG/ACT IN AERS Inhalation Inhale 2 puffs into the lungs every 6 (six) hours as needed.    . IPRATROPIUM-ALBUTEROL 20-100 MCG/ACT IN AERS  Inhalation Inhale 1 puff into the lungs every 6 (six) hours.    Marland Kitchen UNKNOWN TO PATIENT Oral Take 1 tablet by mouth at bedtime as needed. For SLEEP-NAME UNKNOWN    . ALBUTEROL SULFATE HFA 108 (90 BASE) MCG/ACT IN AERS Inhalation Inhale 1-2 puffs into the lungs every 6 (six) hours as needed for wheezing. 1 Inhaler 0    BP 109/63  Pulse 79  Temp 97.9 F (36.6 C) (Oral)  Resp 20  SpO2 89%  Physical Exam  Nursing note and vitals reviewed. Constitutional: He is oriented to person, place, and time. He appears well-developed and well-nourished. No  distress.  HENT:  Head: Normocephalic and atraumatic.  Cardiovascular: Normal rate, regular rhythm and normal heart sounds.   Pulmonary/Chest: Effort normal and breath sounds normal. No respiratory distress. He has no wheezes. He has no rales.  Abdominal: Bowel sounds are normal. He exhibits no distension. There is no tenderness. There is no rebound.  Musculoskeletal: Normal range of motion.  Lymphadenopathy:    He has no cervical adenopathy.  Neurological: He is alert and oriented to person, place, and time.  Skin: Skin is warm and dry.  Psychiatric: He has a normal mood and affect. His behavior is normal.    ED Course  Procedures (including critical care time) DIAGNOSTIC STUDIES: Oxygen Saturation is 97% on [place], normal by my interpretation.    COORDINATION OF CARE: 11:33PM  Discussed pt ED treatment with pt  11:33PM Ordered:   Medications  albuterol (VENTOLIN HFA) 108 (90 BASE) MCG/ACT inhaler (not administered)  Ipratropium-Albuterol (COMBIVENT RESPIMAT) 20-100 MCG/ACT AERS respimat (not administered)  UNKNOWN TO PATIENT (not administered)  albuterol (PROVENTIL HFA;VENTOLIN HFA) 108 (90 BASE) MCG/ACT inhaler 2 puff (not administered)  albuterol (PROVENTIL HFA;VENTOLIN HFA) 108 (90 BASE) MCG/ACT inhaler (not administered)  albuterol (PROVENTIL) (5 MG/ML) 0.5% nebulizer solution 2.5 mg (2.5 mg Nebulization Given 07/04/12 2302)  albuterol (PROVENTIL) (5 MG/ML) 0.5% nebulizer solution 2.5 mg (2.5 mg Nebulization Given 07/05/12 0018)  ipratropium (ATROVENT) nebulizer solution 0.5 mg (0.5 mg Nebulization Given 07/05/12 0018)      Labs Reviewed - No data to display Dg Chest 2 View  07/05/2012  *RADIOLOGY REPORT*  Clinical Data: Shortness of breath after falling the ER, wheezing, history COPD, hypertension, former smoker, pulmonary nodule  CHEST - 2 VIEW  Comparison: 06/19/2012  Findings: Normal heart size, mediastinal contours, and pulmonary vascularity. Emphysematous and bronchitic changes  consistent with COPD. Scarring at lingula. No acute infiltrate, pleural effusion, or pneumothorax. No acute osseous findings.  IMPRESSION: COPD with lingular scarring. No acute abnormalities.   Original Report Authenticated By: Lollie Marrow, M.D.      1. COPD (chronic obstructive pulmonary disease)       MDM  Patient's oxygen saturation off of oxygen and while sleeping is a 90% or greater. Patient's wheezing resolved with albuterol Atrovent treatment here at our he received one plain albuterol treatment prior to me seeing him. All wheezing has resolved. Chest x-rays negative for pneumonia consistent with what is known to have COPD. Patient ran out of his albuterol inhaler one is provided here tonight another prescription provided for later. Patient will followup with Dr. Juanetta Gosling he will continue his Combivent inhaler.   I personally performed the services described in this documentation, which was scribed in my presence. The recorded information has been reviewed and considered.        Shelda Jakes, MD 07/05/12 [phone]

## 2012-07-05 ENCOUNTER — Emergency Department (HOSPITAL_COMMUNITY): Payer: PRIVATE HEALTH INSURANCE

## 2012-07-05 MED ORDER — ALBUTEROL SULFATE HFA 108 (90 BASE) MCG/ACT IN AERS: 1.0000 | INHALATION_SPRAY | Freq: Four times a day (QID) | RESPIRATORY_TRACT | Status: DC | PRN

## 2012-07-05 MED ORDER — IPRATROPIUM BROMIDE 0.02 % IN SOLN
0.5000 mg | Freq: Once | RESPIRATORY_TRACT | Status: AC
  Administered 2012-07-05: 0.5 mg via RESPIRATORY_TRACT
  Filled 2012-07-05: qty 2.5

## 2012-07-05 MED ORDER — ALBUTEROL SULFATE HFA 108 (90 BASE) MCG/ACT IN AERS
2.0000 | INHALATION_SPRAY | Freq: Four times a day (QID) | RESPIRATORY_TRACT | Status: DC | PRN
  Administered 2012-07-05: 2 via RESPIRATORY_TRACT
  Filled 2012-07-05 (×2): qty 6.7

## 2012-07-05 MED ORDER — ALBUTEROL SULFATE (5 MG/ML) 0.5% IN NEBU
2.5000 mg | INHALATION_SOLUTION | Freq: Once | RESPIRATORY_TRACT | Status: AC
  Administered 2012-07-05: 2.5 mg via RESPIRATORY_TRACT
  Filled 2012-07-05: qty 0.5

## 2012-07-08 ENCOUNTER — Emergency Department (HOSPITAL_COMMUNITY): Payer: PRIVATE HEALTH INSURANCE

## 2012-07-08 ENCOUNTER — Emergency Department (HOSPITAL_COMMUNITY)
Admission: EM | Admit: 2012-07-08 | Discharge: 2012-07-08 | Disposition: A | Discharge status: Home / Self Care | Payer: PRIVATE HEALTH INSURANCE | Attending: Emergency Medicine | Admitting: Emergency Medicine

## 2012-07-08 ENCOUNTER — Encounter (HOSPITAL_COMMUNITY): Payer: Self-pay | Admitting: *Deleted

## 2012-07-08 DIAGNOSIS — M25519 Pain in unspecified shoulder: Secondary | ICD-10-CM | POA: Insufficient documentation

## 2012-07-08 DIAGNOSIS — I10 Essential (primary) hypertension: Secondary | ICD-10-CM | POA: Insufficient documentation

## 2012-07-08 DIAGNOSIS — F259 Schizoaffective disorder, unspecified: Secondary | ICD-10-CM | POA: Insufficient documentation

## 2012-07-08 DIAGNOSIS — F411 Generalized anxiety disorder: Secondary | ICD-10-CM | POA: Insufficient documentation

## 2012-07-08 DIAGNOSIS — Z91018 Allergy to other foods: Secondary | ICD-10-CM | POA: Insufficient documentation

## 2012-07-08 DIAGNOSIS — Z800 Family history of malignant neoplasm of digestive organs: Secondary | ICD-10-CM | POA: Insufficient documentation

## 2012-07-08 DIAGNOSIS — J449 Chronic obstructive pulmonary disease, unspecified: Secondary | ICD-10-CM | POA: Insufficient documentation

## 2012-07-08 DIAGNOSIS — J4489 Other specified chronic obstructive pulmonary disease: Secondary | ICD-10-CM | POA: Insufficient documentation

## 2012-07-08 DIAGNOSIS — Z87891 Personal history of nicotine dependence: Secondary | ICD-10-CM | POA: Insufficient documentation

## 2012-07-08 MED ORDER — ALBUTEROL SULFATE (5 MG/ML) 0.5% IN NEBU
2.5000 mg | INHALATION_SOLUTION | Freq: Once | RESPIRATORY_TRACT | Status: AC
  Administered 2012-07-08: 2.5 mg via RESPIRATORY_TRACT
  Filled 2012-07-08: qty 0.5

## 2012-07-08 MED ORDER — OXYCODONE-ACETAMINOPHEN 5-325 MG PO TABS
1.0000 | ORAL_TABLET | Freq: Once | ORAL | Status: AC
  Administered 2012-07-08: 1 via ORAL
  Filled 2012-07-08: qty 1

## 2012-07-08 MED ORDER — HYDROCODONE-ACETAMINOPHEN 5-325 MG PO TABS: 1.0000 | ORAL_TABLET | ORAL | Status: AC | PRN

## 2012-07-08 MED ORDER — ALBUTEROL SULFATE HFA 108 (90 BASE) MCG/ACT IN AERS: 1.0000 | INHALATION_SPRAY | Freq: Four times a day (QID) | RESPIRATORY_TRACT | Status: DC | PRN

## 2012-07-08 NOTE — Progress Notes (Signed)
Pt usually out intoxicated asking for  an mdi albuterol ,

## 2012-07-08 NOTE — ED Notes (Signed)
Pt states went to sleep last night & woke this morning w/ pain in his left shoulder.

## 2012-07-09 NOTE — ED Provider Notes (Signed)
History     CSN: [id number]  Arrival date & time 07/08/12  0530   First MD Initiated Contact with Patient 07/08/12 [phone]      Chief Complaint  Patient presents with  . Shoulder Pain  . Shortness of Breath    (Consider location/radiation/quality/duration/timing/severity/associated sxs/prior treatment) HPI  [name] is a 78 y.o. male with a h/o COPD who presents to the Emergency Department complaining of pain in his left shoulder when he woke up this morning. He also has increased wheezing this morning and does not have an inhaler. He is out of his pain pills.   PCP Dr. Juanetta Gosling  Past Medical History  Diagnosis Date  . COPD (chronic obstructive pulmonary disease)   . Anxiety   . Alcohol abuse   . Anemia   . Generalized headaches   . Hypertension   . 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 Pulmonary nodule/lesion, solitary   . Schizoaffective disorder   . GI bleed     History of recurrent bleeding that dates back as far as 2004 per records,    Past Surgical History  Procedure Date  . Colonoscopy Sept 2009    SLF: few small AVMs in ascending colon distal to IC . Ablated via BICAP.   Marland Kitchen Esophagogastroduodenoscopy Sept 2009    SLF: normal esophagus, small HH, 1-2 AVMs actively oozing in mid stomach, s/p BICAP, path benign   . Esophagogastroduodenoscopy 01/18/2012    Rourk-Erosive reflux esophagitis. Noncritical ring. Antral erosions/ small antral ulcer - appeared innocent  (H pylori serrology negative)  . Colonoscopy 03/09/2012    Procedure: COLONOSCOPY;  Surgeon: West Bali, MD;  Location: [facility];  Service: Endoscopy;  Laterality: N/A;  2:30  . Esophagogastroduodenoscopy 03/09/2012    Procedure: ESOPHAGOGASTRODUODENOSCOPY (EGD);  Surgeon: West Bali, MD;  [facility]: [facility];  Service: Endoscopy;  Laterality: N/A;    Family History  Problem Relation Age of Onset  . Colon cancer Neg Hx      History  Substance Use Topics  . Smoking status: Former Smoker    Quit date: 05/07/1969  . Smokeless tobacco: Current User    Types: Snuff  . Alcohol Use: Not on file     ETOH-pt states quit 12/2011      Review of Systems  Constitutional: Negative for fever.       10 Systems reviewed and are negative for acute change except as noted in the HPI.  HENT: Negative for congestion.   Eyes: Negative for discharge and redness.  Respiratory: Positive for wheezing. Negative for cough and shortness of breath.   Cardiovascular: Negative for chest pain.  Gastrointestinal: Negative for vomiting and abdominal pain.  Musculoskeletal: Negative for back pain.       Left shoulder pain  Skin: Negative for rash.  Neurological: Negative for syncope, numbness and headaches.  Psychiatric/Behavioral:       No behavior change.    Allergies  Black pepper  Home Medications   Current Outpatient Rx  Name Route Sig Dispense Refill  . ALBUTEROL SULFATE HFA 108 (90 BASE) MCG/ACT IN AERS Inhalation Inhale 1-2 puffs into the lungs every 6 (six) hours as needed for wheezing. 1 Inhaler 0  . ALBUTEROL SULFATE HFA 108 (90 BASE) MCG/ACT IN AERS Inhalation Inhale 1-2 puffs into the lungs every 6 (six) hours as needed for wheezing. 1 Inhaler 0  . ALBUTEROL SULFATE HFA 108 (  90 BASE) MCG/ACT IN AERS Inhalation Inhale 2 puffs into the lungs every 6 (six) hours as needed.    Marland Kitchen HYDROCODONE-ACETAMINOPHEN 5-325 MG PO TABS Oral Take 1 tablet by mouth every 4 (four) hours as needed for pain. 10 tablet 0  . IPRATROPIUM-ALBUTEROL 20-100 MCG/ACT IN AERS Inhalation Inhale 1 puff into the lungs every 6 (six) hours.    Marland Kitchen UNKNOWN TO PATIENT Oral Take 1 tablet by mouth at bedtime as needed. For SLEEP-NAME UNKNOWN      BP 138/80  Pulse 80  Temp 97.7 F (36.5 C) (Oral)  Resp 20  Ht 5\' 4"  (1.626 m)  Wt 123 lb (55.792 kg)  BMI 21.11 kg/m2  SpO2 94%  Physical Exam  Nursing note and vitals reviewed. Constitutional: He is  oriented to person, place, and time. He appears well-developed and well-nourished. No distress.       Awake, alert, nontoxic appearance.  HENT:  Head: Atraumatic.  Eyes: Right eye exhibits no discharge. Left eye exhibits no discharge.  Neck: Neck supple.  Cardiovascular: Normal heart sounds.   Pulmonary/Chest: Effort normal. He has wheezes. He exhibits no tenderness.  Abdominal: Soft. Bowel sounds are normal. There is no tenderness. There is no rebound.  Musculoskeletal: He exhibits no tenderness.       Baseline ROM, no obvious new focal weakness.FROM to left shoulder. Mild tenderness to palpation over the top of the shoulder. No erythema, swelling, warmth.  Neurological: He is alert and oriented to person, place, and time.       Mental status and motor strength appears baseline for patient and situation.  Skin: No rash noted.  Psychiatric: He has a normal mood and affect.    ED Course  Procedures (including critical care time)  Labs Reviewed - No data to display Dg Chest 2 View  07/08/2012  *RADIOLOGY REPORT*  Clinical Data: Left shoulder pain, shortness of breath  CHEST - 2 VIEW  Comparison: Prior chest x-ray 07/05/2012  Findings: No significant interval change in the appearance of the chest.  The lungs remain negative for pulmonary edema, or focal airspace consolidation.  Chronic peribronchial cuffing, and emphysematous changes again noted.  There is persistent ill-defined perihilar atelectasis versus scarring.  Cardiac and mediastinal contours remain within normal limits.  No acute osseous findings.  IMPRESSION: No acute cardiopulmonary disease.  No significant interval change compared to 07/05/2012.   Original Report Authenticated By: HEATH      1. COPD (chronic obstructive pulmonary disease)   2. Shoulder pain       MDM  Patient with COPD here with wheezing and left shoulder pain without any recent activity. Chest xray without acute findings. Given nebulizer treatment with  resolution of wheezing. Given analgesic with improvement of shoulder pain.Pt stable in ED with no significant deterioration in condition.The patient appears reasonably screened and/or stabilized for discharge and I doubt any other medical condition or other [facility] requiring further screening, evaluation, or treatment in the ED at this time prior to discharge.  MDM Reviewed: nursing note and vitals Interpretation: x-ray           Nicoletta Dress. Colon Branch, MD 07/09/12 [phone]

## 2012-07-14 ENCOUNTER — Encounter (HOSPITAL_COMMUNITY): Payer: Self-pay | Admitting: Emergency Medicine

## 2012-07-14 ENCOUNTER — Emergency Department (HOSPITAL_COMMUNITY)
Admission: EM | Admit: 2012-07-14 | Discharge: 2012-07-14 | Disposition: A | Discharge status: Home / Self Care | Payer: PRIVATE HEALTH INSURANCE | Attending: Emergency Medicine | Admitting: Emergency Medicine

## 2012-07-14 DIAGNOSIS — I10 Essential (primary) hypertension: Secondary | ICD-10-CM | POA: Insufficient documentation

## 2012-07-14 DIAGNOSIS — J4489 Other specified chronic obstructive pulmonary disease: Secondary | ICD-10-CM | POA: Insufficient documentation

## 2012-07-14 DIAGNOSIS — Z800 Family history of malignant neoplasm of digestive organs: Secondary | ICD-10-CM | POA: Insufficient documentation

## 2012-07-14 DIAGNOSIS — Z91018 Allergy to other foods: Secondary | ICD-10-CM | POA: Insufficient documentation

## 2012-07-14 DIAGNOSIS — Z87891 Personal history of nicotine dependence: Secondary | ICD-10-CM | POA: Insufficient documentation

## 2012-07-14 DIAGNOSIS — F411 Generalized anxiety disorder: Secondary | ICD-10-CM | POA: Insufficient documentation

## 2012-07-14 DIAGNOSIS — J449 Chronic obstructive pulmonary disease, unspecified: Secondary | ICD-10-CM | POA: Insufficient documentation

## 2012-07-14 DIAGNOSIS — F259 Schizoaffective disorder, unspecified: Secondary | ICD-10-CM | POA: Insufficient documentation

## 2012-07-14 MED ORDER — IPRATROPIUM BROMIDE 0.02 % IN SOLN
0.5000 mg | Freq: Once | RESPIRATORY_TRACT | Status: AC
  Administered 2012-07-14: 0.5 mg via RESPIRATORY_TRACT
  Filled 2012-07-14: qty 2.5

## 2012-07-14 MED ORDER — ALBUTEROL SULFATE (5 MG/ML) 0.5% IN NEBU
5.0000 mg | INHALATION_SOLUTION | Freq: Once | RESPIRATORY_TRACT | Status: AC
  Administered 2012-07-14: 5 mg via RESPIRATORY_TRACT
  Filled 2012-07-14: qty 1

## 2012-07-14 NOTE — ED Provider Notes (Signed)
History     CSN: [id number]  Arrival date & time 07/14/12  8295   First MD Initiated Contact with Patient 07/14/12 [phone]      Chief Complaint  Patient presents with  . Shortness of Breath    (Consider location/radiation/quality/duration/timing/severity/associated sxs/prior treatment) HPI [name] is a 78 y.o. male who presents to the Emergency Department complaining of  Shortness of breath and wheezing that began this morning. He was unable to find his inhaler. Denies fever, chills, chest pain.  PCP Dr. Juanetta Gosling   Past Medical History  Diagnosis Date  . COPD (chronic obstructive pulmonary disease)   . Anxiety   . Alcohol abuse   . Anemia   . Generalized headaches   . Hypertension   . 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 Pulmonary nodule/lesion, solitary   . Schizoaffective disorder   . GI bleed     History of recurrent bleeding that dates back as far as 2004 per records,    Past Surgical History  Procedure Date  . Colonoscopy Sept 2009    SLF: few small AVMs in ascending colon distal to IC . Ablated via BICAP.   Marland Kitchen Esophagogastroduodenoscopy Sept 2009    SLF: normal esophagus, small HH, 1-2 AVMs actively oozing in mid stomach, s/p BICAP, path benign   . Esophagogastroduodenoscopy 01/18/2012    Rourk-Erosive reflux esophagitis. Noncritical ring. Antral erosions/ small antral ulcer - appeared innocent  (H pylori serrology negative)  . Colonoscopy 03/09/2012    Procedure: COLONOSCOPY;  Surgeon: West Bali, MD;  Location: [facility];  Service: Endoscopy;  Laterality: N/A;  2:30  . Esophagogastroduodenoscopy 03/09/2012    Procedure: ESOPHAGOGASTRODUODENOSCOPY (EGD);  Surgeon: West Bali, MD;  Location: [facility];  Service: Endoscopy;  Laterality: N/A;    Family History  Problem Relation Age of Onset  . Colon cancer Neg Hx     History  Substance Use Topics  . Smoking status: Former Smoker   Quit date: 05/07/1969  . Smokeless tobacco: Current User    Types: Snuff  . Alcohol Use: Not on file     ETOH-pt states quit 12/2011      Review of Systems  Constitutional: Negative for fever.       10 Systems reviewed and are negative for acute change except as noted in the HPI.  HENT: Negative for congestion.   Eyes: Negative for discharge and redness.  Respiratory: Positive for shortness of breath and wheezing. Negative for cough.   Cardiovascular: Negative for chest pain.  Gastrointestinal: Negative for vomiting and abdominal pain.  Musculoskeletal: Negative for back pain.  Skin: Negative for rash.  Neurological: Negative for syncope, numbness and headaches.  Psychiatric/Behavioral:       No behavior change.    Allergies  Black pepper  Home Medications   Current Outpatient Rx  Name Route Sig Dispense Refill  . ALBUTEROL SULFATE HFA 108 (90 BASE) MCG/ACT IN AERS Inhalation Inhale 1-2 puffs into the lungs every 6 (six) hours as needed for wheezing. 1 Inhaler 0  . ALBUTEROL SULFATE HFA 108 (90 BASE) MCG/ACT IN AERS Inhalation Inhale 1-2 puffs into the lungs every 6 (six) hours as needed for wheezing. 1 Inhaler 0  . ALBUTEROL SULFATE HFA 108 (90 BASE) MCG/ACT IN AERS Inhalation Inhale 2 puffs into the lungs every 6 (six) hours as needed.    Marland Kitchen HYDROCODONE-ACETAMINOPHEN 5-325 MG PO TABS Oral Take  1 tablet by mouth every 4 (four) hours as needed for pain. 10 tablet 0  . IPRATROPIUM-ALBUTEROL 20-100 MCG/ACT IN AERS Inhalation Inhale 1 puff into the lungs every 6 (six) hours.    Marland Kitchen UNKNOWN TO PATIENT Oral Take 1 tablet by mouth at bedtime as needed. For SLEEP-NAME UNKNOWN      BP 133/76  Pulse 67  Temp 98 F (36.7 C) (Oral)  Resp 26  Ht 5\' 4"  (1.626 m)  Wt 123 lb (55.792 kg)  BMI 21.11 kg/m2  SpO2 95%  Physical Exam  Nursing note and vitals reviewed. Constitutional: He is oriented to person, place, and time. He appears well-developed and well-nourished.       Awake,  alert, nontoxic appearance.  HENT:  Head: Atraumatic.  Right Ear: External ear normal.  Left Ear: External ear normal.  Nose: Nose normal.  Mouth/Throat: Oropharynx is clear and moist.  Eyes: Right eye exhibits no discharge. Left eye exhibits no discharge.  Neck: Neck supple.  Cardiovascular: Normal heart sounds.   Pulmonary/Chest: Effort normal. He has wheezes. He exhibits no tenderness.       02 sat 89-91%  Abdominal: Soft. There is no tenderness. There is no rebound.  Musculoskeletal: He exhibits no tenderness.       Baseline ROM, no obvious new focal weakness.  Neurological: He is alert and oriented to person, place, and time.       Mental status and motor strength appears baseline for patient and situation.  Skin: No rash noted.  Psychiatric: He has a normal mood and affect.    ED Course  Procedures (including critical care time)  Labs Reviewed - No data to display No results found.   No diagnosis found.    MDM  Patient with COPD here with wheezing. Initial O2 sat 89-91%. Nebulizer treatment administered. O2 sats 98%. Wheezing resolved. Pt stable in ED with no significant deterioration in condition.The patient appears reasonably screened and/or stabilized for discharge and I doubt any other medical condition or other [facility] requiring further screening, evaluation, or treatment in the ED at this time prior to discharge.  MDM Reviewed: nursing note, vitals and previous chart Reviewed previous: x-ray           Nicoletta Dress. Colon Branch, MD 07/14/12 9604

## 2012-07-14 NOTE — ED Notes (Signed)
Patient complaining of shortness of breath and wheezing that started this morning.

## 2012-07-14 NOTE — ED Notes (Signed)
Patient placed on 2 liters of oxygen via nasal canula 

## 2012-07-14 NOTE — ED Notes (Signed)
Respiratory paged for breathing treatments.  

## 2012-07-14 NOTE — ED Notes (Signed)
Patient reports he feels a little bit better. States he is breathing better at this time. Requesting inhaler to take home.

## 2012-07-15 ENCOUNTER — Emergency Department (HOSPITAL_COMMUNITY)
Admission: EM | Admit: 2012-07-15 | Discharge: 2012-07-15 | Disposition: A | Discharge status: Home / Self Care | Payer: PRIVATE HEALTH INSURANCE | Attending: Emergency Medicine | Admitting: Emergency Medicine

## 2012-07-15 ENCOUNTER — Emergency Department (HOSPITAL_COMMUNITY): Payer: PRIVATE HEALTH INSURANCE

## 2012-07-15 ENCOUNTER — Encounter (HOSPITAL_COMMUNITY): Payer: Self-pay | Admitting: Emergency Medicine

## 2012-07-15 DIAGNOSIS — Z87891 Personal history of nicotine dependence: Secondary | ICD-10-CM | POA: Insufficient documentation

## 2012-07-15 DIAGNOSIS — F411 Generalized anxiety disorder: Secondary | ICD-10-CM | POA: Insufficient documentation

## 2012-07-15 DIAGNOSIS — J441 Chronic obstructive pulmonary disease with (acute) exacerbation: Secondary | ICD-10-CM | POA: Insufficient documentation

## 2012-07-15 DIAGNOSIS — Z800 Family history of malignant neoplasm of digestive organs: Secondary | ICD-10-CM | POA: Insufficient documentation

## 2012-07-15 DIAGNOSIS — F259 Schizoaffective disorder, unspecified: Secondary | ICD-10-CM | POA: Insufficient documentation

## 2012-07-15 DIAGNOSIS — I10 Essential (primary) hypertension: Secondary | ICD-10-CM | POA: Insufficient documentation

## 2012-07-15 MED ORDER — PREDNISONE 50 MG PO TABS: 50.0000 mg | ORAL_TABLET | Freq: Every day | ORAL | Status: AC

## 2012-07-15 MED ORDER — IPRATROPIUM BROMIDE 0.02 % IN SOLN
0.5000 mg | Freq: Once | RESPIRATORY_TRACT | Status: AC
  Administered 2012-07-15: 0.5 mg via RESPIRATORY_TRACT
  Filled 2012-07-15: qty 2.5

## 2012-07-15 MED ORDER — GI COCKTAIL ~~LOC~~
ORAL | Status: AC
  Filled 2012-07-15: qty 30

## 2012-07-15 MED ORDER — ALBUTEROL SULFATE (5 MG/ML) 0.5% IN NEBU
2.5000 mg | INHALATION_SOLUTION | Freq: Once | RESPIRATORY_TRACT | Status: DC
  Filled 2012-07-15: qty 1

## 2012-07-15 MED ORDER — ALBUTEROL SULFATE (5 MG/ML) 0.5% IN NEBU
5.0000 mg | INHALATION_SOLUTION | Freq: Once | RESPIRATORY_TRACT | Status: AC
  Administered 2012-07-15: 5 mg via RESPIRATORY_TRACT
  Filled 2012-07-15: qty 1

## 2012-07-15 MED ORDER — ALBUTEROL SULFATE HFA 108 (90 BASE) MCG/ACT IN AERS: 2.0000 | INHALATION_SPRAY | RESPIRATORY_TRACT | Status: DC | PRN

## 2012-07-15 MED ORDER — ALBUTEROL SULFATE (5 MG/ML) 0.5% IN NEBU
5.0000 mg | INHALATION_SOLUTION | Freq: Once | RESPIRATORY_TRACT | Status: AC
  Administered 2012-07-15: 5 mg via RESPIRATORY_TRACT

## 2012-07-15 NOTE — ED Provider Notes (Signed)
History   This chart was scribed for [name] Hutching, MD by [name]. The patient was seen in room APA11/APA11. Patient's care was started at 0856.  CSN: [id number]  Arrival date & time 07/15/12  4098   First MD Initiated Contact with Patient 07/15/12 [phone]      Chief Complaint  Patient presents with  . Shortness of Breath   The history is provided by the patient. No language interpreter was used.    [name] is a 78 y.o. male with a h/o COPD and hypertension who presents to the Emergency Department complaining of 2 hours of recurrent severe constant SOB and wheezing. Pt was seen at [facility] yesterday for the same symptoms and was treated with a nebulizer breathing treatment. Pt was unable to fill prescription. He denies fever, chills, and rash.  Past Medical History  Diagnosis Date  . COPD (chronic obstructive pulmonary disease)   . Anxiety   . Alcohol abuse   . Anemia   . Generalized headaches   . Hypertension   . 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 Pulmonary nodule/lesion, solitary   . Schizoaffective disorder   . GI bleed     History of recurrent bleeding that dates back as far as 2004 per records,    Past Surgical History  Procedure Date  . Colonoscopy Sept 2009    SLF: few small AVMs in ascending colon distal to IC . Ablated via BICAP.   Marland Kitchen Esophagogastroduodenoscopy Sept 2009    SLF: normal esophagus, small HH, 1-2 AVMs actively oozing in mid stomach, s/p BICAP, path benign   . Esophagogastroduodenoscopy 01/18/2012    Rourk-Erosive reflux esophagitis. Noncritical ring. Antral erosions/ small antral ulcer - appeared innocent  (H pylori serrology negative)  . Colonoscopy 03/09/2012    Procedure: COLONOSCOPY;  Surgeon: West Bali, MD;  [facility];  Service: Endoscopy;  Laterality: N/A;  2:30  . Esophagogastroduodenoscopy 03/09/2012    Procedure: ESOPHAGOGASTRODUODENOSCOPY (EGD);   Surgeon: West Bali, MD;  [facility]: [facility];  Service: Endoscopy;  Laterality: N/A;    Family History  Problem Relation Age of Onset  . Colon cancer Neg Hx     History  Substance Use Topics  . Smoking status: Former Smoker    Quit date: 05/07/1969  . Smokeless tobacco: Current User    Types: Snuff  . Alcohol Use: Not on file     ETOH-pt states quit 12/2011   Review of Systems  Constitutional: Negative for fever and chills.       10 Systems reviewed and are negative for acute change except as noted in the HPI.  HENT: Negative for congestion.   Respiratory: Positive for shortness of breath and wheezing. Negative for cough.   Cardiovascular: Negative for chest pain.  Gastrointestinal: Negative for vomiting and abdominal pain.  Musculoskeletal: Negative for back pain.  Skin: Negative for rash.  Neurological: Negative for syncope, numbness and headaches.  Psychiatric/Behavioral:       No behavior change.    Allergies  Black pepper  Home Medications   Current Outpatient Rx  Name Route Sig Dispense Refill  . ALBUTEROL SULFATE HFA 108 (90 BASE) MCG/ACT IN AERS Inhalation Inhale 2 puffs into the lungs every 6 (six) hours as needed.    Marland Kitchen HYDROCODONE-ACETAMINOPHEN 5-325 MG PO TABS Oral Take 1 tablet by mouth every 4 (four) hours as needed for pain.  10 tablet 0  . IPRATROPIUM-ALBUTEROL 20-100 MCG/ACT IN AERS Inhalation Inhale 1 puff into the lungs every 6 (six) hours.    Marland Kitchen UNKNOWN TO PATIENT Oral Take 1 tablet by mouth at bedtime as needed. For SLEEP-NAME UNKNOWN      BP 186/106  Pulse 82  Temp 97.8 F (36.6 C) (Oral)  Resp 23  SpO2 84%  Physical Exam  Nursing note and vitals reviewed. Constitutional: He is oriented to person, place, and time. He appears well-developed and well-nourished.  HENT:  Head: Normocephalic and atraumatic.  Eyes: Conjunctivae normal and EOM are normal. Pupils are equal, round, and reactive to light.  Neck: Normal range of motion. Neck  supple.  Cardiovascular: Normal rate, regular rhythm and normal heart sounds.   Pulmonary/Chest: Effort normal and breath sounds normal.       Expiratory wheezes.  Abdominal: Soft. Bowel sounds are normal.  Musculoskeletal: Normal range of motion.  Neurological: He is alert and oriented to person, place, and time.  Skin: Skin is warm and dry.  Psychiatric: He has a normal mood and affect.    ED Course  Procedures (including critical care time) DIAGNOSTIC STUDIES: Oxygen Saturation is 84% on [place], low by my interpretation.    COORDINATION OF CARE: 09:12- Evaluated Pt. Pt is awake,alert, and oriented. 09:30- Ordered albuterol (PROVENTIL) (5 MG/ML) 0.5% nebulizer solution 5 mg Once. 09:30- Ordered ipratropium (ATROVENT) nebulizer solution 0.5 mg Once. 09:30- Ordered ipratropium (ATROVENT) nebulizer solution 0.5 mg Once.  Results for orders placed during the hospital encounter of 05/09/12  CBC WITH DIFFERENTIAL      Component Value Range   WBC 3.8 (*) 4.0 - 10.5 K/uL   RBC 3.71 (*) 4.22 - 5.81 MIL/uL   Hemoglobin 10.3 (*) 13.0 - 17.0 g/dL   HCT 45.4 (*) 09.8 - 11.9 %   MCV 87.6  78.0 - 100.0 fL   MCH 27.8  26.0 - 34.0 pg   MCHC 31.7  30.0 - 36.0 g/dL   RDW 14.7 (*) 82.9 - 56.2 %   Platelets 396  150 - 400 K/uL   Neutrophils Relative 41 (*) 43 - 77 %   Lymphocytes Relative 36  12 - 46 %   Monocytes Relative 15 (*) 3 - 12 %   Eosinophils Relative 7 (*) 0 - 5 %   Basophils Relative 1  0 - 1 %   Neutro Abs 1.5 (*) 1.7 - 7.7 K/uL   Lymphs Abs 1.4  0.7 - 4.0 K/uL   Monocytes Absolute 0.6  0.1 - 1.0 K/uL   Eosinophils Absolute 0.3  0.0 - 0.7 K/uL   Basophils Absolute 0.0  0.0 - 0.1 K/uL   RBC Morphology SCHISTOCYTES PRESENT (2-5/hpf)     WBC Morphology TOXIC GRANULATION     Smear Review LARGE PLATELETS PRESENT    CARDIAC PANEL(CRET KIN+CKTOT+MB+TROPI)      Component Value Range   Total CK 221  7 - 232 U/L   CK, MB 6.5 (*) 0.3 - 4.0 ng/mL   Troponin I <0.30  <0.30 ng/mL    Relative Index 2.9 (*) 0.0 - 2.5  COMPREHENSIVE METABOLIC PANEL      Component Value Range   Sodium 137  135 - 145 mEq/L   Potassium 3.6  3.5 - 5.1 mEq/L   Chloride 103  96 - 112 mEq/L   CO2 23  19 - 32 mEq/L   Glucose, Bld 111 (*) 70 - 99 mg/dL   BUN 10  6 - 23  mg/dL   Creatinine, Ser 9.60  0.50 - 1.35 mg/dL   Calcium 9.7  8.4 - 45.4 mg/dL   Total Protein 6.7  6.0 - 8.3 g/dL   Albumin 3.3 (*) 3.5 - 5.2 g/dL   AST 31  0 - 37 U/L   ALT 11  0 - 53 U/L   Alkaline Phosphatase 74  39 - 117 U/L   Total Bilirubin 0.2 (*) 0.3 - 1.2 mg/dL   GFR calc non Af Amer 83 (*) >90 mL/min   GFR calc Af Amer >90  >90 mL/min  BLOOD GAS, ARTERIAL      Component Value Range   O2 Content 2.0     Delivery systems NASAL CANNULA     pH, Arterial 7.361  7.350 - 7.450   pCO2 arterial 41.5  35.0 - 45.0 mmHg   pO2, Arterial 65.8 (*) 80.0 - 100.0 mmHg   Bicarbonate 22.9  20.0 - 24.0 mEq/L   TCO2 21.4  0 - 100 mmol/L   Acid-base deficit 1.7  0.0 - 2.0 mmol/L   O2 Saturation 91.0     Patient temperature 37.0     Collection site RIGHT RADIAL     Drawn by [phone]     Sample type ARTERIAL     Allens test (pass/fail) PASS  PASS  PROTIME-INR      Component Value Range   Prothrombin Time 13.7  11.6 - 15.2 seconds   INR 1.03  0.00 - 1.49  BASIC METABOLIC PANEL      Component Value Range   Sodium 138  135 - 145 mEq/L   Potassium 4.1  3.5 - 5.1 mEq/L   Chloride 104  96 - 112 mEq/L   CO2 19  19 - 32 mEq/L   Glucose, Bld 197 (*) 70 - 99 mg/dL   BUN 11  6 - 23 mg/dL   Creatinine, Ser 9.14  0.50 - 1.35 mg/dL   Calcium 8.7  8.4 - 78.2 mg/dL   GFR calc non Af Amer 85 (*) >90 mL/min   GFR calc Af Amer >90  >90 mL/min  CBC      Component Value Range   WBC 3.0 (*) 4.0 - 10.5 K/uL   RBC 3.58 (*) 4.22 - 5.81 MIL/uL   Hemoglobin 10.0 (*) 13.0 - 17.0 g/dL   HCT 95.6 (*) 21.3 - 08.6 %   MCV 88.0  78.0 - 100.0 fL   MCH 27.9  26.0 - 34.0 pg   MCHC 31.7  30.0 - 36.0 g/dL   RDW 57.8 (*) 46.9 - 62.9 %   Platelets 395   150 - 400 K/uL   Dg Chest Port 1 View  07/15/2012  *RADIOLOGY REPORT*  Clinical Data: Wheezing.  Shortness of breath.  PORTABLE CHEST - 1 VIEW 07/15/2012 0933 hours:  Comparison: Two-view chest x-ray 07/08/2012, 07/05/2012, 08/08/2011.  Findings: Cardiac silhouette normal in size for the [facility] portable technique, unchanged.  Mildly prominent central pulmonary arteries, unchanged.  Hilar and mediastinal contours otherwise unremarkable for age.  Lungs clear.  Bronchovascular markings normal.  Pulmonary vascularity normal.  No pneumothorax.  No pleural effusions.  IMPRESSION: No acute cardiopulmonary disease.   Original Report Authenticated By: Arnell Sieving, M.D.      MDM   Patient is hemodynamically stable. Feels much better after breathing treatment. Color is good. He wants to go home. We'll give him an albuterol inhaler and an Rx for prednisone   I personally performed the services described in this  documentation, which was scribed in my presence. The recorded information has been reviewed and considered.   [name] Hutching, MD 07/15/12 1400

## 2012-07-15 NOTE — ED Notes (Signed)
Pt. states yesterday when he left the hospital he was ok; however, this morning when he woke up he was short of breath.

## 2012-07-15 NOTE — ED Notes (Signed)
Patient coming out of room to desk stating he wants his "breathing thing" to go home.  States he is going to get dressed.  Informed Dr. Adriana Simas that patient is wanting to go home.  Informed patient that Dr. Adriana Simas is aware and will see him next.  Encouraged patient that when he goes home, he has to follow up with his PMD to treat his dyspnea on a regular basis.

## 2012-07-15 NOTE — ED Notes (Signed)
Patient with no complaints at this time. Respirations even and unlabored. Skin warm/dry. Discharge instructions reviewed with patient at this time. Patient given opportunity to voice concerns/ask questions. Patient discharged at this time and left Emergency Department with steady gait.   

## 2012-07-16 ENCOUNTER — Encounter (HOSPITAL_COMMUNITY): Payer: Self-pay | Admitting: Emergency Medicine

## 2012-07-16 ENCOUNTER — Emergency Department (HOSPITAL_COMMUNITY)
Admission: EM | Admit: 2012-07-16 | Discharge: 2012-07-16 | Discharge status: Against Medical Advice | Payer: PRIVATE HEALTH INSURANCE | Attending: Emergency Medicine | Admitting: Emergency Medicine

## 2012-07-16 DIAGNOSIS — R0602 Shortness of breath: Secondary | ICD-10-CM | POA: Insufficient documentation

## 2012-07-16 NOTE — ED Notes (Signed)
Pt brought in by ems for SOB. Pt has been here the last 2 days and walked out. Pt states if he walks out today [facility] . Pt was given albuterol treatment by ems.

## 2012-07-16 NOTE — ED Notes (Signed)
Pt wanted his iv out and then proceeded to walk out of the room. I asked pt where he was going he stated to find his wife. Pt refused to sign the ama paperwork.

## 2012-07-19 ENCOUNTER — Encounter (HOSPITAL_COMMUNITY): Payer: Self-pay | Admitting: *Deleted

## 2012-07-19 ENCOUNTER — Emergency Department (HOSPITAL_COMMUNITY)
Admission: EM | Admit: 2012-07-19 | Discharge: 2012-07-19 | Disposition: A | Discharge status: Home / Self Care | Payer: PRIVATE HEALTH INSURANCE | Attending: Emergency Medicine | Admitting: Emergency Medicine

## 2012-07-19 ENCOUNTER — Emergency Department (HOSPITAL_COMMUNITY): Payer: PRIVATE HEALTH INSURANCE

## 2012-07-19 DIAGNOSIS — J441 Chronic obstructive pulmonary disease with (acute) exacerbation: Secondary | ICD-10-CM

## 2012-07-19 DIAGNOSIS — I10 Essential (primary) hypertension: Secondary | ICD-10-CM | POA: Insufficient documentation

## 2012-07-19 DIAGNOSIS — R0902 Hypoxemia: Secondary | ICD-10-CM

## 2012-07-19 DIAGNOSIS — Z8659 Personal history of other mental and behavioral disorders: Secondary | ICD-10-CM | POA: Insufficient documentation

## 2012-07-19 LAB — CBC WITH DIFFERENTIAL/PLATELET
Basophils Absolute: 0 10*3/uL (ref 0.0–0.1)
Eosinophils Absolute: 0.1 10*3/uL (ref 0.0–0.7)
Eosinophils Relative: 3 % (ref 0–5)
MCH: 24.6 pg — ABNORMAL LOW (ref 26.0–34.0)
MCV: 78.2 fL (ref 78.0–100.0)
Platelets: 394 10*3/uL (ref 150–400)
RDW: 18 % — ABNORMAL HIGH (ref 11.5–15.5)

## 2012-07-19 LAB — BASIC METABOLIC PANEL
Calcium: 9.4 mg/dL (ref 8.4–10.5)
GFR calc non Af Amer: 82 mL/min — ABNORMAL LOW (ref >90)
Sodium: 140 mEq/L (ref 135–145)

## 2012-07-19 LAB — TROPONIN I: Troponin I: <0.3 ng/mL (ref <0.30)

## 2012-07-19 MED ORDER — ALBUTEROL SULFATE HFA 108 (90 BASE) MCG/ACT IN AERS
2.0000 | INHALATION_SPRAY | Freq: Once | RESPIRATORY_TRACT | Status: AC
  Administered 2012-07-19: 2 via RESPIRATORY_TRACT
  Filled 2012-07-19: qty 6.7

## 2012-07-19 MED ORDER — ALBUTEROL SULFATE (5 MG/ML) 0.5% IN NEBU
5.0000 mg | INHALATION_SOLUTION | RESPIRATORY_TRACT | Status: AC
  Administered 2012-07-19 (×3): 5 mg via RESPIRATORY_TRACT
  Filled 2012-07-19 (×3): qty 1

## 2012-07-19 MED ORDER — IPRATROPIUM BROMIDE 0.02 % IN SOLN
0.5000 mg | Freq: Once | RESPIRATORY_TRACT | Status: AC
  Administered 2012-07-19: 0.5 mg via RESPIRATORY_TRACT
  Filled 2012-07-19: qty 2.5

## 2012-07-19 MED ORDER — DEXAMETHASONE SODIUM PHOSPHATE 4 MG/ML IJ SOLN
10.0000 mg | Freq: Once | INTRAMUSCULAR | Status: AC
  Administered 2012-07-19: 10 mg via INTRAMUSCULAR
  Filled 2012-07-19: qty 3

## 2012-07-19 MED ORDER — PREDNISONE 50 MG PO TABS: 50.0000 mg | ORAL_TABLET | Freq: Every day | ORAL | Status: DC

## 2012-07-19 NOTE — ED Notes (Signed)
Patient with no complaints at this time. Respirations even and unlabored. Skin warm/dry. Discharge instructions reviewed with patient at this time. Patient given opportunity to voice concerns/ask questions. Patient discharged at this time and left Emergency Department with steady gait.   

## 2012-07-19 NOTE — ED Notes (Signed)
Sob,for 2-3 weeks,shoulders hurt.  Wheeze audible.

## 2012-07-19 NOTE — ED Provider Notes (Signed)
History  This chart was scribed for Derwood Kaplan, MD by Ladona Ridgel Day. This patient was seen in room APA11/APA11 and the patient's care was started at 1948.   CSN: [id number]  Arrival date & time 07/19/12  1948   First MD Initiated Contact with Patient 07/19/12 2008      Chief Complaint  Patient presents with  . Shortness of Breath   Patient is a 78 y.o. male presenting with shortness of breath. The history is provided by the patient. No language interpreter was used.  Shortness of Breath  The current episode started more than 2 weeks ago. The onset was gradual. The problem occurs continuously. The problem has been gradually worsening. The problem is moderate. Nothing relieves the symptoms. The symptoms are aggravated by activity. Associated symptoms include cough and shortness of breath. Pertinent negatives include no chest pain and no fever. There was no intake of a foreign body.   [name] is a 78 y.o. male who presents to the Emergency Department with COPD hx complaining of constant SOB/wheezing/dry cough for the past 3 weeks. He has an inhaler at home and only used it x1 this AM at home without any relief from his symptoms. He states some bilateral shoulder pain but denies any CP and has no heart history.   Past Medical History  Diagnosis Date  . COPD (chronic obstructive pulmonary disease)   . Anxiety   . Alcohol abuse   . Anemia   . Generalized headaches   . Hypertension   . 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 Pulmonary nodule/lesion, solitary   . Schizoaffective disorder   . GI bleed     History of recurrent bleeding that dates back as far as 2004 per records,    Past Surgical History  Procedure Date  . Colonoscopy Sept 2009    SLF: few small AVMs in ascending colon distal to IC . Ablated via BICAP.   Marland Kitchen Esophagogastroduodenoscopy Sept 2009    SLF: normal esophagus, small HH, 1-2 AVMs actively oozing in mid  stomach, s/p BICAP, path benign   . Esophagogastroduodenoscopy 01/18/2012    Rourk-Erosive reflux esophagitis. Noncritical ring. Antral erosions/ small antral ulcer - appeared innocent  (H pylori serrology negative)  . Colonoscopy 03/09/2012    Procedure: COLONOSCOPY;  Surgeon: West Bali, MD;  Location: [facility];  Service: Endoscopy;  Laterality: N/A;  2:30  . Esophagogastroduodenoscopy 03/09/2012    Procedure: ESOPHAGOGASTRODUODENOSCOPY (EGD);  Surgeon: West Bali, MD;  Location: [facility];  Service: Endoscopy;  Laterality: N/A;    Family History  Problem Relation Age of Onset  . Colon cancer Neg Hx     History  Substance Use Topics  . Smoking status: Former Smoker    Quit date: 05/07/1969  . Smokeless tobacco: Current User    Types: Snuff  . Alcohol Use: No     ETOH-pt states quit 12/2011      Review of Systems  Constitutional: Negative for fever and chills.  HENT: Negative for congestion.   Respiratory: Positive for cough and shortness of breath. Negative for chest tightness.   Cardiovascular: Negative for chest pain and leg swelling.  Gastrointestinal: Negative for nausea, vomiting, abdominal pain and diarrhea.  Musculoskeletal: Negative for back pain.  Neurological: Negative for weakness.  All other systems reviewed and are negative.    Allergies  Black pepper  Home Medications   Current  Outpatient Rx  Name Route Sig Dispense Refill  . ALBUTEROL SULFATE HFA 108 (90 BASE) MCG/ACT IN AERS Inhalation Inhale 2 puffs into the lungs every 6 (six) hours as needed.    . IPRATROPIUM-ALBUTEROL 20-100 MCG/ACT IN AERS Inhalation Inhale 1 puff into the lungs every 6 (six) hours.    Marland Kitchen PREDNISONE 50 MG PO TABS Oral Take 1 tablet (50 mg total) by mouth daily. 7 tablet 1  . UNKNOWN TO PATIENT Oral Take 1 tablet by mouth at bedtime as needed. For SLEEP-NAME UNKNOWN    . HYDROCODONE-ACETAMINOPHEN 5-325 MG PO TABS Oral Take 1 tablet by mouth every 4 (four) hours as  needed for pain. 10 tablet 0    Triage Vitals: BP 142/86  Pulse 95  Temp 98.3 F (36.8 C) (Oral)  Resp 20  Wt 126 lb (57.153 kg)  SpO2 90%  Physical Exam  Nursing note and vitals reviewed. Constitutional: He is oriented to person, place, and time. He appears well-developed and well-nourished. No distress.  HENT:  Head: Normocephalic and atraumatic.  Eyes: EOM are normal.  Neck: Neck supple. No JVD present. No tracheal deviation present.  Cardiovascular: Normal rate, regular rhythm and normal heart sounds.   No murmur heard. Pulmonary/Chest: Effort normal. No respiratory distress.       Tachypnea, no accessory muscle use, no retractions. Mild wheezing   Abdominal: Soft. He exhibits no distension. There is no tenderness. There is no rebound and no guarding.  Musculoskeletal: Normal range of motion.       No pitting edema, no unilateral calf swelling  Neurological: He is alert and oriented to person, place, and time.  Skin: Skin is warm and dry.  Psychiatric: He has a normal mood and affect. His behavior is normal.    ED Course  Procedures (including critical care time) DIAGNOSTIC STUDIES: Oxygen Saturation is 92% on room air, normal by my interpretation.    COORDINATION OF CARE: At 825 PM On exam patient is 89% on RA. Discussed treatment plan with patient which includes breathing treatment, CXR, blood work, and heart markers. Patient agrees.    Labs Reviewed  CBC WITH DIFFERENTIAL  BASIC METABOLIC PANEL  TROPONIN I   No results found.   No diagnosis found.    MDM  Medical screening examination/treatment/procedure(s) were performed by me as the supervising physician. Scribe service was utilized for documentation only.   Date: 07/19/2012  Rate: 78  Rhythm: normal sinus rhythm  QRS Axis: normal  Intervals: normal  ST/T Wave abnormalities: normal  Conduction Disutrbances:none  Narrative Interpretation:   Old EKG Reviewed: unchanged  Differential diagnosis  includes: ACS syndrome CHF exacerbation Valvular disorder Myocarditis Pericarditis Pericardial effusion Pneumonia Pleural effusion Pulmonary edema PE Anemia Musculoskeletal pain COPD exacerbation  Pt with hx of COPD comes in with cc of sob. Recently seen in the ED with the same complain. Noted to be hypoxic, slightly tachypneic, but no distress at my arrival.  The lung exam shows poor air entry. Initial impression is that patient is having COPD exacerbation. Will give some breathing tx and reassess.  10:59 PM Pt's labs showed Hb of 7.9. He deneis any GI blood loss. Refused gauic testing. His lung exam was improved, but there is a little more wheezing than intially seen. I wanted to admit him, also get evaluated for anemia - however, pt reports that he has a son who has mental retardation and he has to be home on time. He had ambulatory pulsox done, and they stayed at  89%.            Derwood Kaplan, MD 07/19/12 2302

## 2012-07-20 ENCOUNTER — Encounter (HOSPITAL_COMMUNITY): Payer: Self-pay | Admitting: *Deleted

## 2012-07-20 ENCOUNTER — Emergency Department (HOSPITAL_COMMUNITY)
Admission: EM | Admit: 2012-07-20 | Discharge: 2012-07-20 | Disposition: A | Discharge status: Home / Self Care | Payer: PRIVATE HEALTH INSURANCE | Attending: Emergency Medicine | Admitting: Emergency Medicine

## 2012-07-20 ENCOUNTER — Emergency Department (HOSPITAL_COMMUNITY): Payer: PRIVATE HEALTH INSURANCE

## 2012-07-20 ENCOUNTER — Other Ambulatory Visit: Payer: Self-pay

## 2012-07-20 DIAGNOSIS — J4489 Other specified chronic obstructive pulmonary disease: Secondary | ICD-10-CM | POA: Insufficient documentation

## 2012-07-20 DIAGNOSIS — F259 Schizoaffective disorder, unspecified: Secondary | ICD-10-CM | POA: Insufficient documentation

## 2012-07-20 DIAGNOSIS — M25519 Pain in unspecified shoulder: Secondary | ICD-10-CM

## 2012-07-20 DIAGNOSIS — Z87891 Personal history of nicotine dependence: Secondary | ICD-10-CM | POA: Insufficient documentation

## 2012-07-20 DIAGNOSIS — R0600 Dyspnea, unspecified: Secondary | ICD-10-CM

## 2012-07-20 DIAGNOSIS — J449 Chronic obstructive pulmonary disease, unspecified: Secondary | ICD-10-CM | POA: Insufficient documentation

## 2012-07-20 DIAGNOSIS — F411 Generalized anxiety disorder: Secondary | ICD-10-CM | POA: Insufficient documentation

## 2012-07-20 DIAGNOSIS — R195 Other fecal abnormalities: Secondary | ICD-10-CM | POA: Insufficient documentation

## 2012-07-20 DIAGNOSIS — R0602 Shortness of breath: Secondary | ICD-10-CM | POA: Insufficient documentation

## 2012-07-20 DIAGNOSIS — I498 Other specified cardiac arrhythmias: Secondary | ICD-10-CM | POA: Insufficient documentation

## 2012-07-20 DIAGNOSIS — I10 Essential (primary) hypertension: Secondary | ICD-10-CM | POA: Insufficient documentation

## 2012-07-20 LAB — CBC WITH DIFFERENTIAL/PLATELET
Basophils Absolute: 0 10*3/uL (ref 0.0–0.1)
Basophils Relative: 0 % (ref 0–1)
Eosinophils Absolute: 0 10*3/uL (ref 0.0–0.7)
Hemoglobin: 8.3 g/dL — ABNORMAL LOW (ref 13.0–17.0)
MCH: 24.4 pg — ABNORMAL LOW (ref 26.0–34.0)
MCHC: 31.4 g/dL (ref 30.0–36.0)
Monocytes Relative: 16 % — ABNORMAL HIGH (ref 3–12)
Neutro Abs: 2.9 10*3/uL (ref 1.7–7.7)
Neutrophils Relative %: 76 % (ref 43–77)
RDW: 18 % — ABNORMAL HIGH (ref 11.5–15.5)

## 2012-07-20 MED ORDER — ALBUTEROL SULFATE (5 MG/ML) 0.5% IN NEBU
2.5000 mg | INHALATION_SOLUTION | RESPIRATORY_TRACT | Status: DC
  Administered 2012-07-20: 2.5 mg via RESPIRATORY_TRACT
  Filled 2012-07-20: qty 0.5

## 2012-07-20 MED ORDER — IPRATROPIUM BROMIDE 0.02 % IN SOLN
0.5000 mg | RESPIRATORY_TRACT | Status: DC
  Administered 2012-07-20: 0.5 mg via RESPIRATORY_TRACT
  Filled 2012-07-20: qty 2.5

## 2012-07-20 NOTE — ED Provider Notes (Signed)
History     CSN: [id number]  Arrival date & time 07/20/12  1222   First MD Initiated Contact with Patient 07/20/12 1300      Chief Complaint  Patient presents with  . Shoulder Pain    (Consider location/radiation/quality/duration/timing/severity/associated sxs/prior treatment) HPI Comments: Patient with history of frequent ED visits.  Well known to ED physicians and staff.  Presents with shortness of breath, pain in both shoulders last night.  He was here for the same last night and was advised to be admitted.  However, as he frequently does, decided he did not want to stay and left AMA.  He returns with continued shortness of breath and pain in both shoulders.    Patient is a 78 y.o. male presenting with shoulder pain. The history is provided by the patient.  Shoulder Pain This is a recurrent problem. The current episode started yesterday. The problem occurs constantly. The problem has not changed since onset.Associated symptoms include shortness of breath. Pertinent negatives include no chest pain. Associated symptoms comments: Shoulder pain . Nothing aggravates the symptoms. Nothing relieves the symptoms.    Past Medical History  Diagnosis Date  . COPD (chronic obstructive pulmonary disease)   . Anxiety   . Alcohol abuse   . Anemia   . Generalized headaches   . Hypertension   . 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 Pulmonary nodule/lesion, solitary   . Schizoaffective disorder   . GI bleed     History of recurrent bleeding that dates back as far as 2004 per records,    Past Surgical History  Procedure Date  . Colonoscopy Sept 2009    SLF: few small AVMs in ascending colon distal to IC . Ablated via BICAP.   Marland Kitchen Esophagogastroduodenoscopy Sept 2009    SLF: normal esophagus, small HH, 1-2 AVMs actively oozing in mid stomach, s/p BICAP, path benign   . Esophagogastroduodenoscopy 01/18/2012    Rourk-Erosive reflux  esophagitis. Noncritical ring. Antral erosions/ small antral ulcer - appeared innocent  (H pylori serrology negative)  . Colonoscopy 03/09/2012    Procedure: COLONOSCOPY;  Surgeon: West Bali, MD;  Location: [facility];  Service: Endoscopy;  Laterality: N/A;  2:30  . Esophagogastroduodenoscopy 03/09/2012    Procedure: ESOPHAGOGASTRODUODENOSCOPY (EGD);  Surgeon: West Bali, MD;  Location: [facility];  Service: Endoscopy;  Laterality: N/A;    Family History  Problem Relation Age of Onset  . Colon cancer Neg Hx     History  Substance Use Topics  . Smoking status: Former Smoker    Quit date: 05/07/1969  . Smokeless tobacco: Current User    Types: Snuff  . Alcohol Use: No     ETOH-pt states quit 12/2011      Review of Systems  Constitutional: Positive for fatigue. Negative for chills.  Respiratory: Positive for shortness of breath.   Cardiovascular: Negative for chest pain.  All other systems reviewed and are negative.    Allergies  Black pepper  Home Medications   Current Outpatient Rx  Name Route Sig Dispense Refill  . ALBUTEROL SULFATE HFA 108 (90 BASE) MCG/ACT IN AERS Inhalation Inhale 2 puffs into the lungs every 6 (six) hours as needed. For shortness of breath    . DIAZEPAM 5 MG PO TABS Oral Take 5 mg by mouth daily.    Marland Kitchen HYDROCODONE-ACETAMINOPHEN 5-325 MG PO TABS Oral Take 1 tablet by mouth  every 6 (six) hours as needed. For pain    . PREDNISONE 50 MG PO TABS Oral Take 1 tablet (50 mg total) by mouth daily. 7 tablet 1  . UNKNOWN TO PATIENT Oral Take 1 tablet by mouth at bedtime as needed. For SLEEP-NAME UNKNOWN    . IPRATROPIUM-ALBUTEROL 20-100 MCG/ACT IN AERS Inhalation Inhale 1 puff into the lungs every 6 (six) hours.      BP 132/81  Pulse 112  Temp 98.4 F (36.9 C) (Oral)  Resp 28  Wt 126 lb (57.153 kg)  SpO2 94%  Physical Exam  Nursing note and vitals reviewed. Constitutional: He is oriented to person, place, and time. He appears  well-developed and well-nourished. No distress.  HENT:  Head: Normocephalic and atraumatic.  Mouth/Throat: Oropharynx is clear and moist.  Neck: Normal range of motion. Neck supple.  Cardiovascular: Normal rate and regular rhythm.   No murmur heard. Pulmonary/Chest: Effort normal and breath sounds normal. No respiratory distress. He has no wheezes.  Abdominal: Soft. Bowel sounds are normal. He exhibits no distension. There is no tenderness.  Genitourinary: Rectum normal.       Hemoccult positive, but no gross blood.  Musculoskeletal: Normal range of motion. He exhibits no edema.  Neurological: He is oriented to person, place, and time.  Skin: Skin is warm and dry. He is not diaphoretic.    ED Course  Procedures (including critical care time)   Labs Reviewed  CBC WITH DIFFERENTIAL  TROPONIN I   Dg Chest Port 1 View  07/19/2012  *RADIOLOGY REPORT*  Clinical Data: Shortness of breath.  PORTABLE CHEST - 1 VIEW  Comparison: Multiple priors.  Findings: Normal heart size with clear lung fields.  No bony abnormality.  Colonic interposition on the right over the dome of the liver.  IMPRESSION: No active cardiopulmonary disease. Similar appearance to priors.   Original Report Authenticated By: Elsie Stain, M.D.    Dg Abd 2 Views  07/19/2012  *RADIOLOGY REPORT*  Clinical Data: Evaluate for possible free air.  Mild discomfort around umbilical area.  ABDOMEN - 2 VIEW  Comparison: 09/05/2010.  Findings: Colonic interposition.  Nonobstructive gas pattern. Degenerative changes lumbar spine.  Vascular calcification.  No worrisome osseous lesions.  Similar appearance to priors.  IMPRESSION: Nonobstructive gas pattern.  No evidence for free air.   Original Report Authenticated By: Elsie Stain, M.D.      No diagnosis found.   Date: 07/20/2012  Rate: 116  Rhythm: sinus tachycardia  QRS Axis: normal  Intervals: normal  ST/T Wave abnormalities: normal  Conduction Disutrbances:none  Narrative  Interpretation:   Old EKG Reviewed: unchanged    MDM  The patient presents here again with shortness of breath.  Last night he left after not wanting to stay.  Within minutes after I evaluated him, he was dressed, at the desk and ready to leave.  The labs show an improving Hb from last night and troponin and ekg that are unremarkable.  At this point I will discharge him to home at his request, to return prn.        Geoffery Lyons, MD 07/20/12 [phone]

## 2012-07-20 NOTE — ED Notes (Signed)
Had bil shoulder pain last pm. This morning pain in lt shoulder , neck and down arm. , sob

## 2012-07-20 NOTE — ED Notes (Signed)
Pt c/o SOB, shoulder pain, and chest discomfort. Pt was seen here yesterday for same symptoms but states they are worse today.

## 2012-07-21 ENCOUNTER — Encounter (HOSPITAL_COMMUNITY): Payer: Self-pay | Admitting: *Deleted

## 2012-07-21 ENCOUNTER — Emergency Department (HOSPITAL_COMMUNITY)
Admission: EM | Admit: 2012-07-21 | Discharge: 2012-07-21 | Disposition: A | Discharge status: Home / Self Care | Payer: PRIVATE HEALTH INSURANCE | Attending: Emergency Medicine | Admitting: Emergency Medicine

## 2012-07-21 DIAGNOSIS — D649 Anemia, unspecified: Secondary | ICD-10-CM | POA: Insufficient documentation

## 2012-07-21 DIAGNOSIS — I10 Essential (primary) hypertension: Secondary | ICD-10-CM | POA: Insufficient documentation

## 2012-07-21 DIAGNOSIS — K5900 Constipation, unspecified: Secondary | ICD-10-CM | POA: Insufficient documentation

## 2012-07-21 DIAGNOSIS — J4489 Other specified chronic obstructive pulmonary disease: Secondary | ICD-10-CM | POA: Insufficient documentation

## 2012-07-21 DIAGNOSIS — F259 Schizoaffective disorder, unspecified: Secondary | ICD-10-CM | POA: Insufficient documentation

## 2012-07-21 DIAGNOSIS — M25519 Pain in unspecified shoulder: Secondary | ICD-10-CM | POA: Insufficient documentation

## 2012-07-21 DIAGNOSIS — Z87891 Personal history of nicotine dependence: Secondary | ICD-10-CM | POA: Insufficient documentation

## 2012-07-21 DIAGNOSIS — J449 Chronic obstructive pulmonary disease, unspecified: Secondary | ICD-10-CM | POA: Insufficient documentation

## 2012-07-21 MED ORDER — HYDROCODONE-ACETAMINOPHEN 5-325 MG PO TABS: 2.0000 | ORAL_TABLET | ORAL | Status: DC | PRN

## 2012-07-21 MED ORDER — HYDROCODONE-ACETAMINOPHEN 5-325 MG PO TABS: 1.0000 | ORAL_TABLET | Freq: Four times a day (QID) | ORAL | Status: DC | PRN

## 2012-07-21 MED ORDER — DOCUSATE SODIUM 100 MG PO CAPS: 100.0000 mg | ORAL_CAPSULE | Freq: Two times a day (BID) | ORAL | Status: DC

## 2012-07-21 NOTE — ED Provider Notes (Signed)
History   This chart was scribed for Shelda Jakes, MD scribed by Magnus Sinning. The patient was seen in room [facility] at 15:43.   CSN: [id number]  Arrival date & time 07/21/12  1521    Chief Complaint  Patient presents with  . Shoulder Pain    (Consider location/radiation/quality/duration/timing/severity/associated sxs/prior treatment) HPI [name] is a 78 y.o. male who presents to the Emergency Department complaining of constant moderate bilateral shoulder pain located at the top of his shoulders, onset two days. Patient was seen yesterday for same sxs and evaluated. He says the pain has eased some, however, he explains it still provides discomfort. Patient was discharged yesterday with pain medication, which he says he last took PTA, along with an asa. He notes that sob since yesterday has improved, as he was also discharged with inhalers home. Patient notes constipation, which he says he is unable to treat with OTC due to finances.   PCP: Dr.Hawkins  Past Medical History  Diagnosis Date  . COPD (chronic obstructive pulmonary disease)   . Anxiety   . Alcohol abuse   . Anemia   . Generalized headaches   . Hypertension   . 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 Pulmonary nodule/lesion, solitary   . Schizoaffective disorder   . GI bleed     History of recurrent bleeding that dates back as far as 2004 per records,    Past Surgical History  Procedure Date  . Colonoscopy Sept 2009    SLF: few small AVMs in ascending colon distal to IC . Ablated via BICAP.   Marland Kitchen Esophagogastroduodenoscopy Sept 2009    SLF: normal esophagus, small HH, 1-2 AVMs actively oozing in mid stomach, s/p BICAP, path benign   . Esophagogastroduodenoscopy 01/18/2012    Rourk-Erosive reflux esophagitis. Noncritical ring. Antral erosions/ small antral ulcer - appeared innocent  (H pylori serrology negative)  . Colonoscopy 03/09/2012   Procedure: COLONOSCOPY;  Surgeon: West Bali, MD;  Location: [facility];  Service: Endoscopy;  Laterality: N/A;  2:30  . Esophagogastroduodenoscopy 03/09/2012    Procedure: ESOPHAGOGASTRODUODENOSCOPY (EGD);  Surgeon: West Bali, MD;  Location: [facility];  Service: Endoscopy;  Laterality: N/A;    Family History  Problem Relation Age of Onset  . Colon cancer Neg Hx     History  Substance Use Topics  . Smoking status: Former Smoker    Quit date: 05/07/1969  . Smokeless tobacco: Current User    Types: Snuff  . Alcohol Use: No     ETOH-pt states quit 12/2011      Review of Systems  Constitutional: Negative for fever.  HENT: Negative for facial swelling.   Eyes: Negative for redness.  Respiratory: Positive for shortness of breath.   Gastrointestinal: Positive for constipation.  Genitourinary: Negative for hematuria.  Musculoskeletal: Positive for arthralgias.       Shoulder pain  Neurological: Negative for headaches.  All other systems reviewed and are negative.    Allergies  Black pepper  Home Medications   Current Outpatient Rx  Name Route Sig Dispense Refill  . ALBUTEROL SULFATE HFA 108 (90 BASE) MCG/ACT IN AERS Inhalation Inhale 2 puffs into the lungs every 6 (six) hours as needed. For shortness of breath    . DIAZEPAM 5 MG PO TABS Oral Take 5 mg by mouth daily.    Marland Kitchen DOCUSATE SODIUM 100 MG PO CAPS Oral Take  1 capsule (100 mg total) by mouth every 12 (twelve) hours. 8 capsule 0  . HYDROCODONE-ACETAMINOPHEN 5-325 MG PO TABS Oral Take 1 tablet by mouth every 6 (six) hours as needed. For pain    . HYDROCODONE-ACETAMINOPHEN 5-325 MG PO TABS Oral Take 1-2 tablets by mouth every 6 (six) hours as needed for pain. 10 tablet 0  . HYDROCODONE-ACETAMINOPHEN 5-325 MG PO TABS Oral Take 2 tablets by mouth every 4 (four) hours as needed for pain. 6 tablet 0  . IPRATROPIUM-ALBUTEROL 20-100 MCG/ACT IN AERS Inhalation Inhale 1 puff into the lungs every 6 (six) hours.    Marland Kitchen  PREDNISONE 50 MG PO TABS Oral Take 1 tablet (50 mg total) by mouth daily. 7 tablet 1  . UNKNOWN TO PATIENT Oral Take 1 tablet by mouth at bedtime as needed. For SLEEP-NAME UNKNOWN      There were no vitals taken for this visit.  Physical Exam  Nursing note and vitals reviewed. Constitutional: He is oriented to person, place, and time. He appears well-developed and well-nourished. No distress.  HENT:  Head: Normocephalic and atraumatic.  Eyes: Conjunctivae normal and EOM are normal.  Neck: Neck supple. No tracheal deviation present.  Cardiovascular: Normal rate and regular rhythm.   No murmur heard. Pulmonary/Chest: Effort normal. No respiratory distress. He has no wheezes. He has no rales.       Lungs clear bilaterally.  Abdominal: Soft. Bowel sounds are normal. He exhibits no distension. There is no tenderness.  Musculoskeletal: Normal range of motion. He exhibits no edema.       No shoulder deformities.   Neurological: He is alert and oriented to person, place, and time. No cranial nerve deficit or sensory deficit.  Skin: Skin is warm and dry.  Psychiatric: He has a normal mood and affect. His behavior is normal.    ED Course  Procedures (including critical care time) DIAGNOSTIC STUDIES:  COORDINATION OF CARE:  15:51: Physical exam performed.  Dg Chest Port 1 View  07/20/2012  *RADIOLOGY REPORT*  Clinical Data: Shortness of breath.  PORTABLE CHEST - 1 VIEW  Comparison: 07/19/2012  Findings: Heart size is normal.  There are coarse lung markings, likely chronic.  No edema.  No focal consolidations or pleural effusions.  Lungs are hyperinflated.  There is minimal lingular scarring.  Prominent nipple shadows are identified bilaterally.  IMPRESSION:  1.  Stable appearance of the chest. 2. No evidence for acute pulmonary abnormality.   Original Report Authenticated By: Patterson Hammersmith, M.D.    Dg Chest Port 1 View  07/19/2012  *RADIOLOGY REPORT*  Clinical Data: Shortness of  breath.  PORTABLE CHEST - 1 VIEW  Comparison: Multiple priors.  Findings: Normal heart size with clear lung fields.  No bony abnormality.  Colonic interposition on the right over the dome of the liver.  IMPRESSION: No active cardiopulmonary disease. Similar appearance to priors.   Original Report Authenticated By: Elsie Stain, M.D.    Dg Abd 2 Views  07/19/2012  *RADIOLOGY REPORT*  Clinical Data: Evaluate for possible free air.  Mild discomfort around umbilical area.  ABDOMEN - 2 VIEW  Comparison: 09/05/2010.  Findings: Colonic interposition.  Nonobstructive gas pattern. Degenerative changes lumbar spine.  Vascular calcification.  No worrisome osseous lesions.  Similar appearance to priors.  IMPRESSION: Nonobstructive gas pattern.  No evidence for free air.   Original Report Authenticated By: Elsie Stain, M.D.      1. Shoulder pain   2. Constipation  MDM  Patient in no acute distress nontoxic patient was seen yesterday for the shoulder pain had a very thorough workup with labs and x-rays were reviewed. Patient appears to be out of his hydrocodone took his last tablet earlier today. I will give him a prepack of hydrocodone 6 tablets and a prescription to get filled on Monday. Also patient though concerns about constipation surrogate a prescription for some Colace. Patient will follow up with his primary care Dr.     I personally performed the services described in this documentation, which was scribed in my presence. The recorded information has been reviewed and considered.      I personally performed the services described in this documentation, which was scribed in my presence. The recorded information has been reviewed and considered.      Shelda Jakes, MD 07/21/12 [phone]

## 2012-07-21 NOTE — ED Notes (Signed)
Pt returns to the ED secondary to continuing bilateral shoulder pain. Pt states he left here last night, only walked 1/2 way home and the police "carried me home". Pt states the pain in the shoulders continues to hurt. Dr Deretha Emory at the bedside.

## 2012-07-21 NOTE — ED Notes (Signed)
Pt c/o bilateral shoulder pain, denies any injury

## 2012-07-23 MED FILL — Hydrocodone-Acetaminophen Tab 5-325 MG: ORAL | Qty: 6 | Status: AC

## 2012-07-25 ENCOUNTER — Encounter (HOSPITAL_COMMUNITY): Payer: Self-pay | Admitting: *Deleted

## 2012-07-25 ENCOUNTER — Emergency Department (HOSPITAL_COMMUNITY)
Admission: EM | Admit: 2012-07-25 | Discharge: 2012-07-25 | Disposition: A | Discharge status: Home / Self Care | Payer: PRIVATE HEALTH INSURANCE | Source: Home / Self Care | Attending: Emergency Medicine | Admitting: Emergency Medicine

## 2012-07-25 DIAGNOSIS — M7918 Myalgia, other site: Secondary | ICD-10-CM

## 2012-07-25 MED ORDER — CYCLOBENZAPRINE HCL 10 MG PO TABS
10.0000 mg | ORAL_TABLET | Freq: Once | ORAL | Status: AC
  Administered 2012-07-25: 10 mg via ORAL
  Filled 2012-07-25: qty 1

## 2012-07-25 MED ORDER — CYCLOBENZAPRINE HCL 10 MG PO TABS: 10.0000 mg | ORAL_TABLET | Freq: Three times a day (TID) | ORAL | Status: DC | PRN

## 2012-07-25 MED ORDER — IBUPROFEN 400 MG PO TABS
400.0000 mg | ORAL_TABLET | Freq: Once | ORAL | Status: AC
  Administered 2012-07-25: 400 mg via ORAL
  Filled 2012-07-25: qty 1

## 2012-07-25 MED ORDER — IBUPROFEN 400 MG PO TABS: 400.0000 mg | ORAL_TABLET | Freq: Three times a day (TID) | ORAL | Status: DC | PRN

## 2012-07-25 NOTE — ED Provider Notes (Cosign Needed)
History    This chart was scribed for Ward Givens, MD, MD by Smitty Pluck. The patient was seen in room APA01 and the patient's care was started at 9:01PM.   CSN: [id number]  Arrival date & time 07/25/12  2010    Chief Complaint  Patient presents with  . Shoulder Pain  . Chest Pain    (Consider location/radiation/quality/duration/timing/severity/associated sxs/prior treatment) The history is provided by the patient and a friend. No language interpreter was used.   [name] is a 78 y.o. male who presents to the Emergency Department BIB neighbor complaining of constant, moderate left neck pain radiating to left shoulder and into his left  arm onset today this PM. Pt reports he has had similar problems in the past (This is the 10th ED visit this month and 4th ED visit for shoulder pain and 22nd ED visit in 6 months). The shoulder pain is an aching pain. He reports he has a intermittent moderate headache. Neighbor reports pt walked to his house and was slumped over and moaning sitting on a chair on the porch. They both deny LOC.  He states that he has chronic SOB. Pt denies chest pain. Pt denies smoking cigarettes and drinking alcohol.  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   PCP is Dr. Juanetta Gosling    Past Medical History  Diagnosis Date  . COPD (chronic obstructive pulmonary disease)   . Anxiety   . Alcohol abuse   . Anemia   . Generalized headaches   . Hypertension   . 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 Pulmonary nodule/lesion, solitary   . Schizoaffective disorder   . GI bleed     History of recurrent bleeding that dates back as far as 2004 per records,    Past Surgical History  Procedure Date  . Colonoscopy Sept 2009    SLF: few small AVMs in ascending colon distal to IC . Ablated via BICAP.   Marland Kitchen Esophagogastroduodenoscopy Sept 2009    SLF: normal esophagus, small HH, 1-2 AVMs actively oozing in mid stomach,  s/p BICAP, path benign   . Esophagogastroduodenoscopy 01/18/2012    Rourk-Erosive reflux esophagitis. Noncritical ring. Antral erosions/ small antral ulcer - appeared innocent  (H pylori serrology negative)  . Colonoscopy 03/09/2012    Procedure: COLONOSCOPY;  Surgeon: West Bali, MD;  Location: [facility];  Service: Endoscopy;  Laterality: N/A;  2:30  . Esophagogastroduodenoscopy 03/09/2012    Procedure: ESOPHAGOGASTRODUODENOSCOPY (EGD);  Surgeon: West Bali, MD;  Location: [facility];  Service: Endoscopy;  Laterality: N/A;    Family History  Problem Relation Age of Onset  . Colon cancer Neg Hx     History  Substance Use Topics  . Smoking status: Former Smoker    Quit date: 05/07/1969  . Smokeless tobacco: Current User    Types: Snuff  . Alcohol Use: No     ETOH-pt states quit 12/2011   Pt lives with sons   Review of Systems  Neurological: Positive for headaches.  All other systems reviewed and are negative.    Allergies  Black pepper  Home Medications   Current Outpatient Rx  Name Route Sig Dispense Refill  . ALBUTEROL SULFATE HFA 108 (90 BASE) MCG/ACT IN AERS Inhalation Inhale 2 puffs into the lungs every 6 (six) hours as needed. For shortness of breath    . CYCLOBENZAPRINE HCL 10  MG PO TABS Oral Take 1 tablet (10 mg total) by mouth 3 (three) times daily as needed for muscle spasms. 30 tablet 0  . DIAZEPAM 5 MG PO TABS Oral Take 5 mg by mouth daily.    Marland Kitchen DOCUSATE SODIUM 100 MG PO CAPS Oral Take 1 capsule (100 mg total) by mouth every 12 (twelve) hours. 8 capsule 0  . HYDROCODONE-ACETAMINOPHEN 5-325 MG PO TABS Oral Take 2 tablets by mouth every 4 (four) hours as needed for pain. 6 tablet 0  . IBUPROFEN 400 MG PO TABS Oral Take 1 tablet (400 mg total) by mouth every 8 (eight) hours as needed for pain. 30 tablet 0  . IPRATROPIUM-ALBUTEROL 20-100 MCG/ACT IN AERS Inhalation Inhale 1 puff into the lungs every 6 (six) hours.    Marland Kitchen PREDNISONE 50 MG PO TABS Oral Take  1 tablet (50 mg total) by mouth daily. 7 tablet 1  . UNKNOWN TO PATIENT Oral Take 1 tablet by mouth at bedtime as needed. For SLEEP-NAME UNKNOWN      BP 132/77  Pulse 95  Temp 98.7 F (37.1 C) (Oral)  Resp 16  SpO2 95%  Vital signs normal    Physical Exam  Nursing note and vitals reviewed. Constitutional: He is oriented to person, place, and time. He appears well-developed and well-nourished.  Non-toxic appearance. He does not appear ill. No distress.  HENT:  Head: Normocephalic and atraumatic.  Right Ear: External ear normal.  Left Ear: External ear normal.  Nose: Nose normal. No mucosal edema or rhinorrhea.  Mouth/Throat: Oropharynx is clear and moist and mucous membranes are normal. No dental abscesses or uvula swelling.  Eyes: Conjunctivae normal and EOM are normal. Pupils are equal, round, and reactive to light.  Neck: Normal range of motion and full passive range of motion without pain. Neck supple.  Cardiovascular: Normal rate, regular rhythm and normal heart sounds.  Exam reveals no gallop and no friction rub.   No murmur heard. Pulmonary/Chest: Effort normal. No respiratory distress. He has wheezes (Rare late end expiratory wheeze). He has no rhonchi. He has no rales. He exhibits no tenderness and no crepitus.        No retractions  Abdominal: Soft. Normal appearance and bowel sounds are normal. He exhibits no distension. There is no tenderness. There is no rebound and no guarding.  Musculoskeletal: Normal range of motion. He exhibits no edema and no tenderness.       Tender over left trapezius muscle that reproduces his pain  Neurological: He is alert and oriented to person, place, and time. He has normal strength. No cranial nerve deficit.  Skin: Skin is warm, dry and intact. No rash noted. No erythema. No pallor.  Psychiatric: He has a normal mood and affect. His speech is normal and behavior is normal. His mood appears not anxious.    ED Course  Procedures  (including critical care time) DIAGNOSTIC STUDIES: Oxygen Saturation is 89% on [place], low by my interpretation.    Patient had CT of cervical spine done in July 2004 which showed advanced degenerative disc disease especially C3 through 7  COORDINATION OF CARE: 9:10 PM Discussed ED treatment with pt  9:10 PM Ordered:   Medications  cyclobenzaprine (FLEXERIL) tablet 10 mg (10 mg Oral Given 07/25/12 2125)  ibuprofen (ADVIL,MOTRIN) tablet 400 mg (400 mg Oral Given 07/25/12 2124)  Pt given heat pack on his sore trapezius muscle.   Pt states his pain is better and ready to go home.  Date: 07/25/2012  Rate: 91  Rhythm: normal sinus rhythm  QRS Axis: normal  Intervals: normal  ST/T Wave abnormalities: nonspecific ST changes  Conduction Disutrbances:none  Narrative Interpretation:   Old EKG Reviewed: changes noted from 07/20/2012 HR was 116   1. Musculoskeletal pain     New Prescriptions   CYCLOBENZAPRINE (FLEXERIL) 10 MG TABLET    Take 1 tablet (10 mg total) by mouth 3 (three) times daily as needed for muscle spasms.   IBUPROFEN (ADVIL,MOTRIN) 400 MG TABLET    Take 1 tablet (400 mg total) by mouth every 8 (eight) hours as needed for pain.    Plan discharge  Devoria Albe, MD, FACEP   MDM   I personally performed the services described in this documentation, which was scribed in my presence. The recorded information has been reviewed and considered.  Devoria Albe, MD, Arm[facility] Gang        Ward Givens, MD 07/25/12 [phone]

## 2012-07-25 NOTE — ED Notes (Signed)
Pt states his left shoulder started hurting after eating. Neighbor that dropped him off states he slumped over & was complaining of chest pains.

## 2012-07-26 ENCOUNTER — Encounter (HOSPITAL_COMMUNITY): Payer: Self-pay

## 2012-07-26 ENCOUNTER — Emergency Department (HOSPITAL_COMMUNITY)
Admission: EM | Admit: 2012-07-26 | Discharge: 2012-07-26 | Discharge status: Against Medical Advice | Payer: PRIVATE HEALTH INSURANCE | Source: Home / Self Care | Attending: Emergency Medicine | Admitting: Emergency Medicine

## 2012-07-26 ENCOUNTER — Other Ambulatory Visit: Payer: Self-pay

## 2012-07-26 DIAGNOSIS — K922 Gastrointestinal hemorrhage, unspecified: Secondary | ICD-10-CM

## 2012-07-26 LAB — OCCULT BLOOD, POC DEVICE: Fecal Occult Bld: POSITIVE

## 2012-07-26 MED ORDER — PANTOPRAZOLE SODIUM 40 MG IV SOLR
40.0000 mg | Freq: Once | INTRAVENOUS | Status: DC
  Filled 2012-07-26: qty 40

## 2012-07-26 NOTE — ED Provider Notes (Signed)
History     CSN: [id number]  Arrival date & time 07/26/12  2137   First MD Initiated Contact with Patient 07/26/12 2256      Chief Complaint  Patient presents with  . Shortness of Breath  . Shoulder Pain     Patient is a 78 y.o. male presenting with shoulder pain. The history is provided by the patient.  Shoulder Pain This is a recurrent problem. Episode onset: today. The problem occurs constantly. The problem has been gradually worsening. Associated symptoms include shortness of breath. Pertinent negatives include no chest pain and no abdominal pain. Exacerbated by: palpation. Nothing relieves the symptoms. He has tried rest for the symptoms. The treatment provided no relief.  Pt reports that he started having left shoulder pain earlier today.  He reports he has had this before.  No trauma/falls No actual CP reported. He reports some SOB but reports this is mostly baseline for him.  No fever reported.  He also reports black stool starting today, which he has had previously.  He denies abdominal pain or weakness.  Past Medical History  Diagnosis Date  . COPD (chronic obstructive pulmonary disease)   . Anxiety   . Alcohol abuse   . Anemia   . Generalized headaches   . Hypertension   . 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 Pulmonary nodule/lesion, solitary   . Schizoaffective disorder   . GI bleed     History of recurrent bleeding that dates back as far as 2004 per records,    Past Surgical History  Procedure Date  . Colonoscopy Sept 2009    SLF: few small AVMs in ascending colon distal to IC . Ablated via BICAP.   Marland Kitchen Esophagogastroduodenoscopy Sept 2009    SLF: normal esophagus, small HH, 1-2 AVMs actively oozing in mid stomach, s/p BICAP, path benign   . Esophagogastroduodenoscopy 01/18/2012    Rourk-Erosive reflux esophagitis. Noncritical ring. Antral erosions/ small antral ulcer - appeared innocent  (H pylori  serrology negative)  . Colonoscopy 03/09/2012    Procedure: COLONOSCOPY;  Surgeon: West Bali, MD;  [facility];  Service: Endoscopy;  Laterality: N/A;  2:30  . Esophagogastroduodenoscopy 03/09/2012    Procedure: ESOPHAGOGASTRODUODENOSCOPY (EGD);  Surgeon: West Bali, MD;  Location: [facility];  Service: Endoscopy;  Laterality: N/A;    Family History  Problem Relation Age of Onset  . Colon cancer Neg Hx     History  Substance Use Topics  . Smoking status: Former Smoker    Quit date: 05/07/1969  . Smokeless tobacco: Current User    Types: Snuff  . Alcohol Use: No     ETOH-pt states quit 12/2011      Review of Systems  Constitutional: Negative for fever.  Respiratory: Positive for shortness of breath.   Cardiovascular: Negative for chest pain.  Gastrointestinal: Positive for blood in stool. Negative for abdominal pain.  Neurological: Negative for weakness.  All other systems reviewed and are negative.    Allergies  Black pepper  Home Medications   Current Outpatient Rx  Name Route Sig Dispense Refill  . ALBUTEROL SULFATE HFA 108 (90 BASE) MCG/ACT IN AERS Inhalation Inhale 2 puffs into the lungs every 6 (six) hours as needed.    . ALBUTEROL SULFATE HFA 108 (90 BASE) MCG/ACT IN AERS Inhalation Inhale 2 puffs into the lungs every 6 (six) hours as needed. For shortness  of breath    . CYCLOBENZAPRINE HCL 10 MG PO TABS Oral Take 1 tablet (10 mg total) by mouth 3 (three) times daily as needed for muscle spasms. 30 tablet 0  . DIAZEPAM 5 MG PO TABS Oral Take 5 mg by mouth at bedtime.     Marland Kitchen DOCUSATE SODIUM 100 MG PO CAPS Oral Take 1 capsule (100 mg total) by mouth every 12 (twelve) hours. 8 capsule 0  . HYDROCODONE-ACETAMINOPHEN 5-325 MG PO TABS Oral Take 2 tablets by mouth every 4 (four) hours as needed for pain. 6 tablet 0  . IBUPROFEN 400 MG PO TABS Oral Take 1 tablet (400 mg total) by mouth every 8 (eight) hours as needed for pain. 30 tablet 0  .  IPRATROPIUM-ALBUTEROL 20-100 MCG/ACT IN AERS Inhalation Inhale 1 puff into the lungs every 6 (six) hours.    Marland Kitchen PREDNISONE 50 MG PO TABS Oral Take 1 tablet (50 mg total) by mouth daily. 7 tablet 1    BP 138/95  Pulse 100  Temp 97.7 F (36.5 C) (Oral)  Resp 20  Ht 5\' 6"  (1.676 m)  Wt 125 lb (56.7 kg)  BMI 20.18 kg/m2  SpO2 97%  Physical Exam CONSTITUTIONAL: Well developed/well nourished HEAD AND FACE: Normocephalic/atraumatic EYES: EOMI/PERRL ENMT: Mucous membranes moist NECK: supple no meningeal signs SPINE:entire spine nontender CV: S1/S2 noted, no murmurs/rubs/gallops noted LUNGS: scattered wheezes noted no apparent distress ABDOMEN: soft, nontender, no rebound or guarding GU:no cva tenderness Rectal - stool color black, chaperone present NEURO: Pt is awake/alert, moves all extremitiesx4 EXTREMITIES: pulses normal, full ROM.  No tenderness to left shoulder, full ROM noted, no erythema/warmth noted, no deformity SKIN: warm, color normal PSYCH: no abnormalities of mood noted  ED Course  Procedures    Labs Reviewed  OCCULT BLOOD, POC DEVICE  CBC WITH DIFFERENTIAL  BASIC METABOLIC PANEL  TYPE AND SCREEN  OCCULT BLOOD X 1 CARD TO LAB, STOOL   11:38 PM Pt refused blood draw reports he does not need it.  He reports he feels well and will go home.  I advised him of need for admission for GI bleed.  He understands risks of leaving and does not want to stay  MDM  Nursing notes including past medical history and social history reviewed and considered in documentation Previous records reviewed and considered = pt with recent ED visit and also has h/o GI bleed        Date: 07/26/2012  Rate: 82  Rhythm: normal sinus rhythm  QRS Axis: normal  Intervals: normal  ST/T Wave abnormalities: nonspecific ST changes  Conduction Disutrbances:none  Narrative Interpretation:   Old EKG Reviewed: unchanged    Joya Gaskins, MD 07/26/12 2339

## 2012-07-26 NOTE — ED Notes (Signed)
Patient states that he is hurting in his left shoulder like he did yesterday. Also complaining of trouble breathing. No distress noted at triage.

## 2012-07-26 NOTE — ED Notes (Signed)
Patient came out to desk yelling that we are not taking anymore blood from him.  Patient put his face in this nurse's face and raised his fist to Clista Bernhardt, [facility].  Patient stated he was leaving.  Escorted to waiting room by security.

## 2012-07-27 ENCOUNTER — Emergency Department (HOSPITAL_COMMUNITY)
Admission: EM | Admit: 2012-07-27 | Discharge: 2012-07-27 | Disposition: A | Discharge status: Home / Self Care | Payer: PRIVATE HEALTH INSURANCE | Source: Home / Self Care

## 2012-07-27 ENCOUNTER — Encounter (HOSPITAL_COMMUNITY): Payer: Self-pay | Admitting: *Deleted

## 2012-07-27 ENCOUNTER — Telehealth: Payer: Self-pay | Admitting: Urgent Care

## 2012-07-27 ENCOUNTER — Encounter (HOSPITAL_COMMUNITY): Payer: Self-pay | Admitting: Emergency Medicine

## 2012-07-27 ENCOUNTER — Inpatient Hospital Stay (HOSPITAL_COMMUNITY)
Admission: EM | Admit: 2012-07-27 | Discharge: 2012-07-27 | DRG: 282 | Discharge status: Against Medical Advice | Payer: PRIVATE HEALTH INSURANCE | Attending: Pulmonary Disease | Admitting: Pulmonary Disease

## 2012-07-27 DIAGNOSIS — K922 Gastrointestinal hemorrhage, unspecified: Secondary | ICD-10-CM

## 2012-07-27 DIAGNOSIS — F1010 Alcohol abuse, uncomplicated: Secondary | ICD-10-CM | POA: Diagnosis present

## 2012-07-27 DIAGNOSIS — K921 Melena: Secondary | ICD-10-CM | POA: Diagnosis present

## 2012-07-27 DIAGNOSIS — F419 Anxiety disorder, unspecified: Secondary | ICD-10-CM | POA: Diagnosis present

## 2012-07-27 DIAGNOSIS — I214 Non-ST elevation (NSTEMI) myocardial infarction: Secondary | ICD-10-CM | POA: Diagnosis present

## 2012-07-27 DIAGNOSIS — M25512 Pain in left shoulder: Secondary | ICD-10-CM | POA: Diagnosis present

## 2012-07-27 DIAGNOSIS — D649 Anemia, unspecified: Secondary | ICD-10-CM | POA: Diagnosis present

## 2012-07-27 DIAGNOSIS — M25511 Pain in right shoulder: Secondary | ICD-10-CM | POA: Diagnosis present

## 2012-07-27 DIAGNOSIS — F259 Schizoaffective disorder, unspecified: Secondary | ICD-10-CM | POA: Diagnosis present

## 2012-07-27 DIAGNOSIS — Z87891 Personal history of nicotine dependence: Secondary | ICD-10-CM

## 2012-07-27 DIAGNOSIS — K5520 Angiodysplasia of colon without hemorrhage: Secondary | ICD-10-CM

## 2012-07-27 DIAGNOSIS — F411 Generalized anxiety disorder: Secondary | ICD-10-CM | POA: Diagnosis present

## 2012-07-27 DIAGNOSIS — I059 Rheumatic mitral valve disease, unspecified: Secondary | ICD-10-CM

## 2012-07-27 DIAGNOSIS — J449 Chronic obstructive pulmonary disease, unspecified: Secondary | ICD-10-CM | POA: Diagnosis present

## 2012-07-27 DIAGNOSIS — Q2733 Arteriovenous malformation of digestive system vessel: Principal | ICD-10-CM

## 2012-07-27 DIAGNOSIS — M25519 Pain in unspecified shoulder: Secondary | ICD-10-CM

## 2012-07-27 DIAGNOSIS — J4489 Other specified chronic obstructive pulmonary disease: Secondary | ICD-10-CM | POA: Diagnosis present

## 2012-07-27 LAB — LIPID PANEL
LDL Cholesterol: 113 mg/dL — ABNORMAL HIGH (ref 0–99)
Triglycerides: 42 mg/dL (ref <150)
VLDL: 8 mg/dL (ref 0–40)

## 2012-07-27 LAB — CBC WITH DIFFERENTIAL/PLATELET
Basophils Absolute: 0 10*3/uL (ref 0.0–0.1)
Basophils Relative: 0 % (ref 0–1)
Eosinophils Relative: 0 % (ref 0–5)
HCT: 26.8 % — ABNORMAL LOW (ref 39.0–52.0)
Hemoglobin: 8.5 g/dL — ABNORMAL LOW (ref 13.0–17.0)
MCH: 24.4 pg — ABNORMAL LOW (ref 26.0–34.0)
MCHC: 31.7 g/dL (ref 30.0–36.0)
MCV: 77 fL — ABNORMAL LOW (ref 78.0–100.0)
Monocytes Absolute: 0 10*3/uL — ABNORMAL LOW (ref 0.1–1.0)
Monocytes Relative: 0 % — ABNORMAL LOW (ref 3–12)
Neutro Abs: 4 10*3/uL (ref 1.7–7.7)
RDW: 18.4 % — ABNORMAL HIGH (ref 11.5–15.5)

## 2012-07-27 LAB — BASIC METABOLIC PANEL
BUN: 13 mg/dL (ref 6–23)
CO2: 27 mEq/L (ref 19–32)
Calcium: 9.4 mg/dL (ref 8.4–10.5)
Chloride: 100 mEq/L (ref 96–112)
Creatinine, Ser: 0.85 mg/dL (ref 0.50–1.35)
GFR calc Af Amer: >90 mL/min (ref >90)

## 2012-07-27 LAB — CK TOTAL AND CKMB (NOT AT ARMC)
CK, MB: 10.2 ng/mL (ref 0.3–4.0)
Total CK: 190 U/L (ref 7–232)

## 2012-07-27 LAB — TROPONIN I: Troponin I: 0.62 ng/mL (ref <0.30)

## 2012-07-27 MED ORDER — ACETAMINOPHEN 650 MG RE SUPP: 650.0000 mg | Freq: Four times a day (QID) | RECTAL | Status: DC | PRN

## 2012-07-27 MED ORDER — PANTOPRAZOLE SODIUM 40 MG IV SOLR
40.0000 mg | Freq: Two times a day (BID) | INTRAVENOUS | Status: DC
  Administered 2012-07-27: 40 mg via INTRAVENOUS
  Filled 2012-07-27: qty 40

## 2012-07-27 MED ORDER — ACETAMINOPHEN 325 MG PO TABS: 650.0000 mg | ORAL_TABLET | Freq: Four times a day (QID) | ORAL | Status: DC | PRN

## 2012-07-27 MED ORDER — DEXTROSE-NACL 5-0.45 % IV SOLN
INTRAVENOUS | Status: DC
  Administered 2012-07-27: 05:00:00 via INTRAVENOUS

## 2012-07-27 MED ORDER — ATORVASTATIN CALCIUM 40 MG PO TABS: 40.0000 mg | ORAL_TABLET | Freq: Every day | ORAL | Status: DC

## 2012-07-27 MED ORDER — PANTOPRAZOLE SODIUM 40 MG IV SOLR
40.0000 mg | Freq: Once | INTRAVENOUS | Status: AC
  Administered 2012-07-27: 40 mg via INTRAVENOUS
  Filled 2012-07-27: qty 40

## 2012-07-27 MED ORDER — ALBUTEROL SULFATE HFA 108 (90 BASE) MCG/ACT IN AERS
2.0000 | INHALATION_SPRAY | Freq: Four times a day (QID) | RESPIRATORY_TRACT | Status: DC | PRN
  Administered 2012-07-27: 2 via RESPIRATORY_TRACT
  Filled 2012-07-27 (×2): qty 6.7

## 2012-07-27 MED ORDER — CYCLOBENZAPRINE HCL 10 MG PO TABS: 10.0000 mg | ORAL_TABLET | Freq: Three times a day (TID) | ORAL | Status: DC | PRN

## 2012-07-27 MED ORDER — HYDROCODONE-ACETAMINOPHEN 5-325 MG PO TABS: 2.0000 | ORAL_TABLET | ORAL | Status: DC | PRN

## 2012-07-27 MED ORDER — SODIUM CHLORIDE 0.9 % IJ SOLN: 3.0000 mL | Freq: Two times a day (BID) | INTRAMUSCULAR | Status: DC

## 2012-07-27 MED ORDER — DIAZEPAM 5 MG PO TABS: 5.0000 mg | ORAL_TABLET | Freq: Every day | ORAL | Status: DC

## 2012-07-27 MED ORDER — ONDANSETRON HCL 4 MG PO TABS: 4.0000 mg | ORAL_TABLET | Freq: Four times a day (QID) | ORAL | Status: DC | PRN

## 2012-07-27 MED ORDER — ALBUTEROL SULFATE (5 MG/ML) 0.5% IN NEBU: 2.5000 mg | INHALATION_SOLUTION | RESPIRATORY_TRACT | Status: DC | PRN

## 2012-07-27 MED ORDER — METOPROLOL TARTRATE 25 MG PO TABS: 25.0000 mg | ORAL_TABLET | Freq: Two times a day (BID) | ORAL | Status: DC

## 2012-07-27 MED ORDER — ONDANSETRON HCL 4 MG/2ML IJ SOLN: 4.0000 mg | Freq: Four times a day (QID) | INTRAMUSCULAR | Status: DC | PRN

## 2012-07-27 NOTE — ED Notes (Signed)
Pt was upset about having his blood drawn, states "it makes me feel weak" Talked to pt and informed him of how much blood was being taken and why we needed it. Pt agreed to have blood samples drawn, lab in room at this time obtaining samples.

## 2012-07-27 NOTE — Consult Note (Signed)
.   Primary care physician: Dr. Kari Baars  Clinical Summary Mr. Mccabe is an 78 y.o.male with history outlined below, now admitted to the hospital complaining of "dark stools" since last week. He states that over this time he has also been experiencing more increased shortness of breath, and shoulder/neck discomfort. He has been admitted for further evaluation, has evidence of heme positive stools and anemia, GI consultation pending. His cardiac markers are also initially abnormal, consistent with a probable NSTEMI.  Recent ECGs reviewed showing sinus rhythm with nonspecific ST changes, mainly anterolateral leads, most recently with mild QT prolongation. He denies any active shoulder or neck discomfort, breathing comfortably on my examination. Today's echocardiogram reviewed showing overall normal LVEF, but mild inferoposterior hypokinesis.  His history includes recurring GI bleeds with AVMs, certainly impacting the ability to use chronic antiplatelet medications or anticoagulation at this time. He has been hemodynamically stable so far, presently on no cardiac medications.  Allergies  Allergen Reactions  . Black Pepper (Piper Nigrum) Itching    Medications    . diazepam  5 mg Oral QHS  . pantoprazole (PROTONIX) IV  40 mg Intravenous Once  . pantoprazole (PROTONIX) IV  40 mg Intravenous Q12H  . sodium chloride  3 mL Intravenous Q12H    Past Medical History  Diagnosis Date  . COPD (chronic obstructive pulmonary disease)   . Anxiety   . Alcohol abuse   . Anemia   . Generalized headaches   . Hypertension   . 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 Pulmonary nodule/lesion, solitary   . Schizoaffective disorder   . GI bleed     History of recurrent bleeding that dates back as far as 2004 per records,    Past Surgical History  Procedure Date  . Colonoscopy Sept 2009    SLF: few small AVMs in ascending colon distal to  IC . Ablated via BICAP.   Marland Kitchen Esophagogastroduodenoscopy Sept 2009    SLF: normal esophagus, small HH, 1-2 AVMs actively oozing in mid stomach, s/p BICAP, path benign   . Esophagogastroduodenoscopy 01/18/2012    Rourk-Erosive reflux esophagitis. Noncritical ring. Antral erosions/ small antral ulcer - appeared innocent  (H pylori serrology negative)  . Colonoscopy 03/09/2012    Procedure: COLONOSCOPY;  Surgeon: West Bali, MD;  [facility]: [facility];  Service: Endoscopy;  Laterality: N/A;  2:30  . Esophagogastroduodenoscopy 03/09/2012    Procedure: ESOPHAGOGASTRODUODENOSCOPY (EGD);  Surgeon: West Bali, MD;  [facility]: [facility];  Service: Endoscopy;  Laterality: N/A;    Family History  Problem Relation Age of Onset  . Colon cancer Neg Hx     Social History Mr. Soy reports that he quit smoking about 43 years ago. His smoking use included Cigarettes. His smokeless tobacco use includes Snuff. Mr. Hackler reports that he does not drink alcohol.  Review of Systems Somewhat limited. Patient does not indicate any regular chest pain, seems to be chronically short of breath, although recently more than normal. Denies any edema or orthopnea. No palpitations or syncope. States appetite has been limited recently. Otherwise negative.  Physical Examination Blood pressure 122/76, pulse 93, temperature 97.9 F (36.6 C), temperature source Oral, resp. rate 18, height 5\' 6"  (1.676 m), SpO2 96.00%.  Thin elderly male in no acute distress. HEENT: Conjunctiva and lids normal, oropharynx clear. Neck: Supple, normal JVP, no carotid bruits, no thyromegaly. Lungs: Clear to auscultation, diminished,  nonlabored breathing at rest. Cardiac: Regular rate and rhythm, S4, no significant systolic murmur, no pericardial rub. Abdomen: Soft, nontender, bowel sounds present, no guarding or rebound. Extremities: No pitting edema, distal pulses 1-2+. Skin: Warm and dry. Musculoskeletal: No  kyphosis. Neuropsychiatric: Alert and oriented x3, affect grossly appropriate.   Lab Results Basic Metabolic Panel:  Lab 07/27/12 1478  NA 136  K 3.6  CL 100  CO2 27  GLUCOSE 177*  BUN 13  CREATININE 0.85  CALCIUM 9.4  MG --  PHOS --   CBC:  Lab 07/27/12 0225 07/20/12 1338  WBC 4.2 3.9*  NEUTROABS 4.0 2.9  HGB 8.5* 8.3*  HCT 26.8* 26.4*  MCV 77.0* 77.6*  PLT 411* 416*   Cardiac Enzymes:  Lab 07/27/12 0536 07/20/12 1338  CKTOTAL 190 --  CKMB 10.2* --  CKMBINDEX -- --  TROPONINI 0.62* <0.30   Imaging Echocardiogram 9/27  Study Conclusions  - Left ventricle: The cavity size was normal. Wall thickness was normal. Systolic function was normal. The estimated ejection fraction was in the range of 55% to 60%. There is hypokinesis of the inferolateral myocardium. Doppler parameters are consistent with abnormal left ventricular relaxation (grade 1 diastolic dysfunction). - Aortic valve: Trileaflet; mildly thickened, mildly calcified leaflets. Mean gradient: 3mm Hg (S). - Mitral valve: Calcified annulus. Mild regurgitation. - Tricuspid valve: Mild-moderate regurgitation. - Pulmonary arteries: PA peak pressure: 46mm Hg (S). - Pericardium, extracardiac: There was no pericardial effusion.   Impression  1. NSTEMI, type I versus type II in the setting of active GI bleed. Patient is hemodynamically stable, denies any recurring shoulder or neck discomfort at the present time. His ECG shows nonspecific ST segment abnormalities.  2. History of recurrent GI bleed with AVMs. GI consultation is pending.  3. History of alcohol abuse.  4. Hypertension, blood pressure presently stable.  5. History of schizoaffective disorder.   Recommendations  Therapeutic options from a cardiac perspective are somewhat limited at this time, particularly in light of the patient's GI bleed. Hold off on aspirin, and certainly anticoagulant therapy. We will try and initiate low-dose beta  blocker as well as empirically start statin therapy, baseline lipids not yet available. Cardiac catheterization would not be pursued at this point, and in particular percutaneous revascularization would not be able to be pursued unless it is determined that he could take at least a one-month course of dual antiplatelet therapy. Would need more input from GI, although suspect that with AVMs he is going to remain at high risk for recurrent GI bleeding. Having said that, he will also remain at risk for recurrent myocardial infarction and heart failure with medical therapy as well. We will follow with you.  Jonelle Sidle, M.D., F.A.C.C.

## 2012-07-27 NOTE — ED Notes (Signed)
Pt states he will not stay any longer and is leaving. Repeated attempts to get patient to stay but patient states he will not.

## 2012-07-27 NOTE — ED Notes (Signed)
Called to give report, was told they will call me back in 10 minutes

## 2012-07-27 NOTE — Progress Notes (Signed)
Pt states "I'm going home. I am not staying up here anymore."  Repeated attempts to get patient to stay. Pt removed his telemetry monitor. Again stated "I'm leaving."  Explained to patient the risks involved with leaving against medical advice. Pt continued to insist on leaving.  Pt signed AMA form. Form placed on chart.  Pt's IV site D/C'd and WNL.  Pt's son called and made aware of pt's wishes to leave AMA.  Son states "he does this all the time.  He won't listen to nobody."  Pt left floor accompanied by [place], NT.  Critical troponin received on patient after patient had already left the floor. Critical troponin 0.92 at 1222 received. At 1228, Dr. Felecia Shelling paged and made aware of both pt leaving AMA and critical troponin. MD stated RN should call family and make them aware.  Son called and made aware of patient's wishes to leave again and also made aware of critical lab values. 2A RN found patient waiting on ride in the ED waiting room. Explained to patient that his lab work may possibly indicate a MI. Pt verbalized understanding. Pt stated he would go back to ED to be examined. Triage RN made aware of situation. RN left patient with Triage RN.

## 2012-07-27 NOTE — Telephone Encounter (Signed)
Contacted for consult on inpatient.  Discharged from [facility] May 2013 per his request (see Dr Evelina Dun note). Consult forwarded to other GI Service.  Dr Karilyn Cota aware.

## 2012-07-27 NOTE — Progress Notes (Signed)
*  PRELIMINARY RESULTS* Echocardiogram 2D Echocardiogram has been performed.  Conrad Forsyth 07/27/2012, 9:14 AM

## 2012-07-27 NOTE — Progress Notes (Signed)
Critical troponin 0.62 called; text paged MD on call; new orders received.

## 2012-07-27 NOTE — Progress Notes (Signed)
Critical CKMB 10.2 called; MD notified via text page.

## 2012-07-27 NOTE — ED Notes (Signed)
Pt reports with left shoulder pain, states he was just discharged less than an hour ago and when he got home the pain began to get worse, denies injury, pt AAX4 at this time, NAD noted. No other complaints at this time.

## 2012-07-27 NOTE — Progress Notes (Signed)
UR chart review completed.  

## 2012-07-27 NOTE — H&P (Addendum)
[name] is an 78 y.o. male.    PCP: Fredirick Maudlin, MD   Chief Complaint: Shoulder pain, and black colored stools  HPI: This is a 78 year old, African American male, with a history of of chronic GI bleeds presumably from an arteriovenous malformation. He is a history of COPD, as well. And, also has psychiatric problems, such as anxiety disorder, and possibly even schizophrenia. He was in his usual state of health till a few days ago, when he started having shoulder pain. He, says the pain goes in between both his shoulders and sometimes he gets neck pain as well. The was the primary reason why he came in to the hospital. Subsequently, he also mentioned, that he's been having black stools for the last few days. He attributes the black stools to iron tablets that he has been taking. Denies any blood in the stools. Denies any nausea, vomiting. No chest pain or shortness of breath. Denies any other changes to his medications. Patient is not a very good historian and wouldn't allow proper examination as well.   Home Medications: Prior to Admission medications   Medication Sig Start Date End Date Taking? Authorizing Provider  albuterol (PROAIR HFA) 108 (90 BASE) MCG/ACT inhaler Inhale 2 puffs into the lungs every 6 (six) hours as needed.    Historical Provider, MD  albuterol (VENTOLIN HFA) 108 (90 BASE) MCG/ACT inhaler Inhale 2 puffs into the lungs every 6 (six) hours as needed. For shortness of breath    Historical Provider, MD  cyclobenzaprine (FLEXERIL) 10 MG tablet Take 1 tablet (10 mg total) by mouth 3 (three) times daily as needed for muscle spasms. 07/25/12   Ward Givens, MD  diazepam (VALIUM) 5 MG tablet Take 5 mg by mouth at bedtime.     Historical Provider, MD  docusate sodium (COLACE) 100 MG capsule Take 1 capsule (100 mg total) by mouth every 12 (twelve) hours. 07/21/12   Shelda Jakes, MD  HYDROcodone-acetaminophen (NORCO/VICODIN) 5-325 MG per tablet Take 2 tablets by mouth  every 4 (four) hours as needed for pain. 07/21/12   Shelda Jakes, MD  ibuprofen (ADVIL,MOTRIN) 400 MG tablet Take 1 tablet (400 mg total) by mouth every 8 (eight) hours as needed for pain. 07/25/12   Ward Givens, MD  Ipratropium-Albuterol (COMBIVENT RESPIMAT) 20-100 MCG/ACT AERS respimat Inhale 1 puff into the lungs every 6 (six) hours.    Historical Provider, MD    Allergies:  Allergies  Allergen Reactions  . Black Pepper (Piper Nigrum) Itching    Past Medical History: Past Medical History  Diagnosis Date  . COPD (chronic obstructive pulmonary disease)   . Anxiety   . Alcohol abuse   . Anemia   . Generalized headaches   . Hypertension   . 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 Pulmonary nodule/lesion, solitary   . Schizoaffective disorder   . GI bleed     History of recurrent bleeding that dates back as far as 2004 per records,    Past Surgical History  Procedure Date  . Colonoscopy Sept 2009    SLF: few small AVMs in ascending colon distal to IC . Ablated via BICAP.   Marland Kitchen Esophagogastroduodenoscopy Sept 2009    SLF: normal esophagus, small HH, 1-2 AVMs actively oozing in mid stomach, s/p BICAP, path benign   . Esophagogastroduodenoscopy 01/18/2012    Rourk-Erosive reflux esophagitis. Noncritical ring. Antral erosions/  small antral ulcer - appeared innocent  (H pylori serrology negative)  . Colonoscopy 03/09/2012    Procedure: COLONOSCOPY;  Surgeon: West Bali, MD;  [facility];  Service: Endoscopy;  Laterality: N/A;  2:30  . Esophagogastroduodenoscopy 03/09/2012    Procedure: ESOPHAGOGASTRODUODENOSCOPY (EGD);  Surgeon: West Bali, MD;  [facility];  Service: Endoscopy;  Laterality: N/A;    Social History:  reports that he quit smoking about 43 years ago. His smokeless tobacco use includes Snuff. He reports that he does not drink alcohol or use illicit drugs.  Family History:  Family  History  Problem Relation Age of Onset  . Colon cancer Neg Hx     Review of Systems -  unobtainable from patient due to lack of cooperation  Physical Examination Blood pressure 118/78, pulse 82, temperature 98 F (36.7 C), temperature source Oral, resp. rate 18, SpO2 94.00%.  General appearance: alert, cooperative, appears stated age and no distress Head: Normocephalic, without obvious abnormality, atraumatic Eyes: conjunctivae/corneas clear. PERRL, EOM's intact. Throat: lips, mucosa, and tongue normal; teeth and gums normal Resp: clear to auscultation bilaterally. No wheezing is appreciated. Cardio: regular rate and rhythm, S1, S2 normal, no murmur, click, rub or gallop GI: soft, non-tender; bowel sounds normal; no masses,  no organomegaly Extremities: He has good range of motion of both his shoulders. No obvious deformity was noted. Pulses: 2+ and symmetric Skin: Skin color, texture, turgor normal. No rashes or lesions Lymph nodes: Cervical, supraclavicular, and axillary nodes normal. Neurologic: Grossly normal  Laboratory Data: Results for orders placed during the hospital encounter of 07/27/12 (from the past 48 hour(s))  CBC WITH DIFFERENTIAL     Status: Abnormal   Collection Time   07/27/12  2:25 AM      Component Value Range Comment   WBC 4.2  4.0 - 10.5 K/uL    RBC 3.48 (*) 4.22 - 5.81 MIL/uL    Hemoglobin 8.5 (*) 13.0 - 17.0 g/dL    HCT 16.1 (*) 09.6 - 52.0 %    MCV 77.0 (*) 78.0 - 100.0 fL    MCH 24.4 (*) 26.0 - 34.0 pg    MCHC 31.7  30.0 - 36.0 g/dL    RDW 04.5 (*) 40.9 - 15.5 %    Platelets 411 (*) 150 - 400 K/uL    Neutrophils Relative 96 (*) 43 - 77 %    Neutro Abs 4.0  1.7 - 7.7 K/uL    Lymphocytes Relative 4 (*) 12 - 46 %    Lymphs Abs 0.1 (*) 0.7 - 4.0 K/uL    Monocytes Relative 0 (*) 3 - 12 %    Monocytes Absolute 0.0 (*) 0.1 - 1.0 K/uL    Eosinophils Relative 0  0 - 5 %    Eosinophils Absolute 0.0  0.0 - 0.7 K/uL    Basophils Relative 0  0 - 1 %     Basophils Absolute 0.0  0.0 - 0.1 K/uL   BASIC METABOLIC PANEL     Status: Abnormal   Collection Time   07/27/12  2:25 AM      Component Value Range Comment   Sodium 136  135 - 145 mEq/L    Potassium 3.6  3.5 - 5.1 mEq/L    Chloride 100  96 - 112 mEq/L    CO2 27  19 - 32 mEq/L    Glucose, Bld 177 (*) 70 - 99 mg/dL    BUN 13  6 - 23  mg/dL    Creatinine, Ser 1.61  0.50 - 1.35 mg/dL    Calcium 9.4  8.4 - 09.6 mg/dL    GFR calc non Af Amer 80 (*) >90 mL/min    GFR calc Af Amer >90  >90 mL/min   TYPE AND SCREEN     Status: Normal   Collection Time   07/27/12  2:25 AM      Component Value Range Comment   ABO/RH(D) O POS      Antibody Screen NEG      Sample Expiration 07/30/2012       Radiology Reports: No results found.  Assessment/Plan  Principal Problem:  *Melena Active Problems:  Anemia  AV malformation of gastrointestinal tract  Anxiety  Shoulder pain, bilateral   #1 melena: Presumably from his AVMs. Monitor his CBCs closely. [facility] consult gastroenterology. We'll place him on PPI. Dr. Juanetta Gosling will assume care in the morning. Transfuse him as needed. He did undergo an EGD and colonoscopy in May of this year. These revealed mild gastritis, sigmoid colon polyps and hemorrhoids. No active bleeding was noted. It is unclear if he underwent a capsule study recently.  #2 shoulder pain: Etiology is unclear. He has good range of motion. I would defer further evaluation to his primary care physician.  #3 history of anemia: His hemoglobin is stable compared to a level from a few days ago.  #4 history of COPD: Continue with inhalers as needed.  DVT, prophylaxis with SCDs. He is a full code.  Further management decisions will depend on results of further testing and patient's response to treatment.  [facility]  [facility] Pager [phone]  07/27/2012, 4:26 AM   Troponin was reported as 0.6. Will get CK and MB. Cycle troponins. Get EKG. Will consult  cardiology.  Katrine Radich 6:22 AM

## 2012-07-27 NOTE — Progress Notes (Signed)
INITIAL ADULT NUTRITION ASSESSMENT Date: 07/27/2012   Time: 10:40 AM Reason for Assessment: Malnutrition Screen Score=2  ASSESSMENT: Male 78 y.o.  Dx: Melena   Past Medical History  Diagnosis Date  . COPD (chronic obstructive pulmonary disease)   . Anxiety   . Alcohol abuse   . Anemia   . Generalized headaches   . Hypertension   . 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 Pulmonary nodule/lesion, solitary   . Schizoaffective disorder   . GI bleed     History of recurrent bleeding that dates back as far as 2004 per records,    Scheduled Meds:   . diazepam  5 mg Oral QHS  . pantoprazole (PROTONIX) IV  40 mg Intravenous Once  . pantoprazole (PROTONIX) IV  40 mg Intravenous Q12H  . sodium chloride  3 mL Intravenous Q12H   Continuous Infusions:   . dextrose 5 % and 0.45% NaCl 50 mL/hr at 07/27/12 0518   PRN Meds:.acetaminophen, acetaminophen, albuterol, albuterol, cyclobenzaprine, HYDROcodone-acetaminophen, ondansetron (ZOFRAN) IV, ondansetron  Ht: 5\' 6"  (167.6 cm)  Wt:  125# (56.7 kg)  Ideal Wt:  63.8 kg % Ideal Wt: 89%  Usual Wt: 125#  Wt Readings from Last 10 Encounters:  07/26/12 125 lb (56.7 kg)  07/20/12 126 lb (57.153 kg)  07/19/12 126 lb (57.153 kg)  07/16/12 123 lb (55.792 kg)  07/14/12 123 lb (55.792 kg)  07/08/12 123 lb (55.792 kg)  06/19/12 123 lb (55.792 kg)  05/10/12 120 lb 14.4 oz (54.84 kg)  05/08/12 125 lb (56.7 kg)  04/26/12 125 lb 6.4 oz (56.881 kg)   BMI= 20 meets criteria for normal range  Food/Nutrition Related Hx: Pt has been seen in the ED numerous times (23 within 6 mo). He frequently leaves AMA per ED notes. Currently admitted secondary to Melena. GI and Cardiology consults pending. His wt is stable. NPO at this time. Will add oral suppl when diet is advanced. Allergy to [facility] Pepper noted.    CMP     Component Value Date/Time   NA 136 07/27/2012 0225   K 3.6 07/27/2012 0225   CL 100 07/27/2012 0225   CO2 27 07/27/2012 0225   GLUCOSE 177* 07/27/2012 0225   BUN 13 07/27/2012 0225   CREATININE 0.85 07/27/2012 0225   CALCIUM 9.4 07/27/2012 0225   PROT 6.7 05/09/2012 2245   ALBUMIN 3.3* 05/09/2012 2245   AST 31 05/09/2012 2245   ALT 11 05/09/2012 2245   ALKPHOS 74 05/09/2012 2245   BILITOT 0.2* 05/09/2012 2245   GFRNONAA 80* 07/27/2012 0225   GFRAA >90 07/27/2012 0225    Intake/Output Summary (Last 24 hours) at 07/27/12 1047 Last data filed at 07/27/12 0518  Gross per 24 hour  Intake      0 ml  Output      0 ml  Net      0 ml    Diet Order: NPO  Supplements/Tube Feeding:None at this time  IVF:    dextrose 5 % and 0.45% NaCl Last Rate: 50 mL/hr at 07/27/12 0518    Estimated Nutritional Needs:   Kcal:1700-2000 kcal Protein:74-85 gr Fluid:1 ml/kcal  NUTRITION DIAGNOSIS: None at this time  MONITORING/EVALUATION(Goals): -Monitor: diet advancement, po intake -Goal: Pt to meet >/= 90% of their estimated nutrition needs; not met  EDUCATION NEEDS: -No education needs identified at this time  INTERVENTION: -RD to follow for nutrition plan and add oral  supplement when diet is advanced  Dietitian [phone]  DOCUMENTATION CODES Per approved criteria  -Not Applicable    Francene Boyers 07/27/2012, 10:40 AM

## 2012-07-27 NOTE — Progress Notes (Signed)
CRITICAL VALUE ALERT  Critical value received:  Troponin 0.92  Date of notification:  07/27/12  Time of notification:  1222  Critical value read back:yes  Nurse who received alert:  Dannial Monarch, RN  MD notified (1st page):  Dr. Felecia Shelling  Time of first page:  1228  MD notified (2nd page):  Time of second page:  Responding MD:  Dr. Felecia Shelling  Time MD responded:  1228

## 2012-07-27 NOTE — ED Provider Notes (Signed)
History     CSN: [id number]  Arrival date & time 07/27/12  0050   First MD Initiated Contact with Patient 07/27/12 0141      Chief Complaint  Patient presents with  . Shoulder Pain  . Rectal Bleeding     Patient is a 78 y.o. male presenting with shoulder pain. The history is provided by the patient.  Shoulder Pain This is a recurrent problem. The current episode started less than 1 hour ago. The problem occurs constantly. The problem has not changed since onset.Pertinent negatives include no abdominal pain. Nothing aggravates the symptoms. Nothing relieves the symptoms. He has tried rest for the symptoms. The treatment provided no relief.    Past Medical History  Diagnosis Date  . COPD (chronic obstructive pulmonary disease)   . Anxiety   . Alcohol abuse   . Anemia   . Generalized headaches   . Hypertension   . 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 Pulmonary nodule/lesion, solitary   . Schizoaffective disorder   . GI bleed     History of recurrent bleeding that dates back as far as 2004 per records,    Past Surgical History  Procedure Date  . Colonoscopy Sept 2009    SLF: few small AVMs in ascending colon distal to IC . Ablated via BICAP.   Marland Kitchen Esophagogastroduodenoscopy Sept 2009    SLF: normal esophagus, small HH, 1-2 AVMs actively oozing in mid stomach, s/p BICAP, path benign   . Esophagogastroduodenoscopy 01/18/2012    Rourk-Erosive reflux esophagitis. Noncritical ring. Antral erosions/ small antral ulcer - appeared innocent  (H pylori serrology negative)  . Colonoscopy 03/09/2012    Procedure: COLONOSCOPY;  Surgeon: West Bali, MD;  [facility];  Service: Endoscopy;  Laterality: N/A;  2:30  . Esophagogastroduodenoscopy 03/09/2012    Procedure: ESOPHAGOGASTRODUODENOSCOPY (EGD);  Surgeon: West Bali, MD;  [facility]: [facility];  Service: Endoscopy;  Laterality: N/A;    Family History   Problem Relation Age of Onset  . Colon cancer Neg Hx     History  Substance Use Topics  . Smoking status: Former Smoker    Quit date: 05/07/1969  . Smokeless tobacco: Current User    Types: Snuff  . Alcohol Use: No     ETOH-pt states quit 12/2011      Review of Systems  Constitutional: Negative for fever.  Gastrointestinal: Positive for blood in stool. Negative for abdominal pain.  All other systems reviewed and are negative.    Allergies  Black pepper  Home Medications   Current Outpatient Rx  Name Route Sig Dispense Refill  . ALBUTEROL SULFATE HFA 108 (90 BASE) MCG/ACT IN AERS Inhalation Inhale 2 puffs into the lungs every 6 (six) hours as needed.    . ALBUTEROL SULFATE HFA 108 (90 BASE) MCG/ACT IN AERS Inhalation Inhale 2 puffs into the lungs every 6 (six) hours as needed. For shortness of breath    . CYCLOBENZAPRINE HCL 10 MG PO TABS Oral Take 1 tablet (10 mg total) by mouth 3 (three) times daily as needed for muscle spasms. 30 tablet 0  . DIAZEPAM 5 MG PO TABS Oral Take 5 mg by mouth at bedtime.     Marland Kitchen DOCUSATE SODIUM 100 MG PO CAPS Oral Take 1 capsule (100 mg total) by mouth every 12 (twelve) hours. 8 capsule 0  . HYDROCODONE-ACETAMINOPHEN 5-325 MG PO TABS Oral Take  2 tablets by mouth every 4 (four) hours as needed for pain. 6 tablet 0  . IBUPROFEN 400 MG PO TABS Oral Take 1 tablet (400 mg total) by mouth every 8 (eight) hours as needed for pain. 30 tablet 0  . IPRATROPIUM-ALBUTEROL 20-100 MCG/ACT IN AERS Inhalation Inhale 1 puff into the lungs every 6 (six) hours.      BP 118/78  Pulse 82  Temp 98 F (36.7 C) (Oral)  Resp 18  SpO2 94%  Physical Exam CONSTITUTIONAL: Well developed/well nourished HEAD AND FACE: Normocephalic/atraumatic EYES: EOMI/PERRL ENMT: Mucous membranes moist NECK: supple no meningeal signs SPINE:entire spine nontender CV: S1/S2 noted, no murmurs/rubs/gallops noted LUNGS: Lungs are clear to auscultation bilaterally, no apparent  distress ABDOMEN: soft, nontender, no rebound or guarding GU:no cva tenderness NEURO: Pt is awake/alert, moves all extremitiesx4 EXTREMITIES: pulses normal, full ROM, no tenderness noted SKIN: warm, color normal PSYCH: no abnormalities of mood noted  ED Course  Procedures    Labs Reviewed  CBC WITH DIFFERENTIAL  BASIC METABOLIC PANEL  TYPE AND SCREEN  pt here for repeat visit Was here earlier and noted to have melena +hemoccult He left but has now returned for admission His last EGD in may 2013 showed gastritis and cscope showed polyps only He is also noted to have AV malformation of GI tract  3:56 AM Pt currently stable HGB at baseline Type and screen ordered D/w hospitalist to admit for dr Juanetta Gosling  Will need monitoring for GI bleed   MDM  Nursing notes including past medical history and social history reviewed and considered in documentation Previous records reviewed and considered - last egd/c-scope results reviewed Labs/vital reviewed and considered         Joya Gaskins, MD 07/27/12 [phone]

## 2012-07-27 NOTE — ED Notes (Signed)
Pt was inpatient.  Left AMA   Today,  Nurse was bringing pt down to meet his sons, and received call that troponin was even higher. And convinced pt to stay, Pale.

## 2012-07-30 ENCOUNTER — Emergency Department (HOSPITAL_COMMUNITY)
Admission: EM | Admit: 2012-07-30 | Discharge: 2012-07-30 | Discharge status: Against Medical Advice | Payer: PRIVATE HEALTH INSURANCE | Attending: Emergency Medicine | Admitting: Emergency Medicine

## 2012-07-30 ENCOUNTER — Encounter (HOSPITAL_COMMUNITY): Payer: Self-pay | Admitting: *Deleted

## 2012-07-30 DIAGNOSIS — R0602 Shortness of breath: Secondary | ICD-10-CM | POA: Insufficient documentation

## 2012-07-30 NOTE — ED Notes (Addendum)
Pt states he has misplaced the last inhaler he got. States the combivent does not work as well.  Pt wanting the EDP to hurry up he just needs a inhaler

## 2012-07-30 NOTE — ED Notes (Signed)
Pt states he was leaving he was tired of waiting. Delay explained & pt informed to return back to the ER for any new or worsernig symptoms.

## 2012-07-30 NOTE — ED Notes (Signed)
Says he needs an inhaler, Sob.

## 2012-08-01 ENCOUNTER — Emergency Department (HOSPITAL_COMMUNITY)
Admission: EM | Admit: 2012-08-01 | Discharge: 2012-08-01 | Disposition: A | Discharge status: Home / Self Care | Payer: PRIVATE HEALTH INSURANCE | Attending: Emergency Medicine | Admitting: Emergency Medicine

## 2012-08-01 DIAGNOSIS — J4489 Other specified chronic obstructive pulmonary disease: Secondary | ICD-10-CM | POA: Insufficient documentation

## 2012-08-01 DIAGNOSIS — I10 Essential (primary) hypertension: Secondary | ICD-10-CM | POA: Insufficient documentation

## 2012-08-01 DIAGNOSIS — Z87891 Personal history of nicotine dependence: Secondary | ICD-10-CM | POA: Insufficient documentation

## 2012-08-01 DIAGNOSIS — R0602 Shortness of breath: Secondary | ICD-10-CM | POA: Insufficient documentation

## 2012-08-01 DIAGNOSIS — D649 Anemia, unspecified: Secondary | ICD-10-CM | POA: Insufficient documentation

## 2012-08-01 DIAGNOSIS — J449 Chronic obstructive pulmonary disease, unspecified: Secondary | ICD-10-CM | POA: Insufficient documentation

## 2012-08-01 DIAGNOSIS — Z79899 Other long term (current) drug therapy: Secondary | ICD-10-CM | POA: Insufficient documentation

## 2012-08-01 MED ORDER — ALBUTEROL SULFATE HFA 108 (90 BASE) MCG/ACT IN AERS
2.0000 | INHALATION_SPRAY | Freq: Four times a day (QID) | RESPIRATORY_TRACT | Status: DC | PRN
  Administered 2012-08-01: 2 via RESPIRATORY_TRACT
  Filled 2012-08-01: qty 6.7

## 2012-08-01 NOTE — ED Notes (Signed)
Pt ran out of inhaler last night. SOB began this morning. Pt states he needs another inhaler.

## 2012-08-01 NOTE — ED Provider Notes (Signed)
History  This chart was scribed for Shelda Jakes, MD by Bennett Scrape. This patient was seen in room [facility] and the patient's care was started at 4:52PM.  CSN: [id number]  Arrival date & time 08/01/12  1617   First MD Initiated Contact with Patient 08/01/12 1652      Chief Complaint  Patient presents with  . Shortness of Breath     Patient is a 78 y.o. male presenting with shortness of breath. The history is provided by the patient. No language interpreter was used.  Shortness of Breath  The current episode started today. The onset was gradual. The problem occurs continuously. The problem has been gradually worsening. The problem is mild. Associated symptoms include shortness of breath. Pertinent negatives include no chest pain, no fever, no sore throat and no cough.    Aitan Rossbach is a 78 y.o. male with a h/o COPD who presents to the Emergency Department complaining of a flare up of chronic SOB since this morning which is baseline for him. He reports that his breathing still feels "tight" currently. He was seen for the same on 07/26/12 and discharged with an inhaler but states that he lost it and is requesting another one. He reports that his is still having trouble with SOB at night but denies changes.  He denies fever, nausea, emesis, HA and rash as associated symptoms. He also has a h/o anxiety, anemia and HTN. He is a former smoker but denies alcohol use.  Pt is well known to me and has multiple prior visits for the same complaint  Past Medical History  Diagnosis Date  . COPD (chronic obstructive pulmonary disease)   . Anxiety   . Alcohol abuse   . Anemia   . Generalized headaches   . Hypertension   . 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 Pulmonary nodule/lesion, solitary   . Schizoaffective disorder   . GI bleed     History of recurrent bleeding that dates back as far as 2004 per records,    Past  Surgical History  Procedure Date  . Colonoscopy Sept 2009    SLF: few small AVMs in ascending colon distal to IC . Ablated via BICAP.   Marland Kitchen Esophagogastroduodenoscopy Sept 2009    SLF: normal esophagus, small HH, 1-2 AVMs actively oozing in mid stomach, s/p BICAP, path benign   . Esophagogastroduodenoscopy 01/18/2012    Rourk-Erosive reflux esophagitis. Noncritical ring. Antral erosions/ small antral ulcer - appeared innocent  (H pylori serrology negative)  . Colonoscopy 03/09/2012    Procedure: COLONOSCOPY;  Surgeon: West Bali, MD;  [facility];  Service: Endoscopy;  Laterality: N/A;  2:30  . Esophagogastroduodenoscopy 03/09/2012    Procedure: ESOPHAGOGASTRODUODENOSCOPY (EGD);  Surgeon: West Bali, MD;  [facility];  Service: Endoscopy;  Laterality: N/A;    Family History  Problem Relation Age of Onset  . Colon cancer Neg Hx     History  Substance Use Topics  . Smoking status: Former Smoker    Types: Cigarettes    Quit date: 05/07/1969  . Smokeless tobacco: Current User    Types: Snuff  . Alcohol Use: No     ETOH-pt states quit 12/2011      Review of Systems  Constitutional: Negative for fever and chills.  HENT: Negative for congestion and sore throat.   Eyes: Negative for visual disturbance.  Respiratory: Positive  for shortness of breath. Negative for cough.   Cardiovascular: Negative for chest pain.  Gastrointestinal: Negative for nausea, vomiting, abdominal pain and diarrhea.  Genitourinary: Negative for dysuria and frequency.  Musculoskeletal: Negative for back pain.  Skin: Negative for rash.  Neurological: Negative for headaches.    Allergies  Black pepper  Home Medications   Current Outpatient Rx  Name Route Sig Dispense Refill  . ALBUTEROL SULFATE HFA 108 (90 BASE) MCG/ACT IN AERS Inhalation Inhale 2 puffs into the lungs every 6 (six) hours as needed. For shortness of breath    . DIAZEPAM 5 MG PO TABS Oral Take 5 mg by mouth 2  (two) times daily.    Marland Kitchen FERROUS SULFATE 325 (65 FE) MG PO TABS Oral Take 325 mg by mouth 3 (three) times daily with meals.    Marland Kitchen HYDROCODONE-ACETAMINOPHEN 5-325 MG PO TABS Oral Take 1 tablet by mouth 4 (four) times daily as needed. Pain.    . IPRATROPIUM-ALBUTEROL 20-100 MCG/ACT IN AERS Inhalation Inhale 1 puff into the lungs every 6 (six) hours.    Marland Kitchen PREDNISONE 50 MG PO TABS Oral Take 50 mg by mouth daily.      Triage Vitals: BP 117/78  Pulse 92  Temp 98.1 F (36.7 C) (Oral)  Resp 20  SpO2 94%  Physical Exam  Nursing note and vitals reviewed. Constitutional: He is oriented to person, place, and time. He appears well-developed and well-nourished. No distress.  HENT:  Head: Normocephalic and atraumatic.  Eyes: Conjunctivae normal and EOM are normal.  Neck: Neck supple. No tracheal deviation present.  Cardiovascular: Normal rate and regular rhythm.   No murmur heard. Pulmonary/Chest: Effort normal and breath sounds normal. No respiratory distress.  Abdominal: Soft. Bowel sounds are normal. There is no tenderness.  Musculoskeletal: Normal range of motion. He exhibits no edema (no ankle swelling).       Able to move both sets of toes  Neurological: He is alert and oriented to person, place, and time.  Skin: Skin is warm and dry.  Psychiatric: He has a normal mood and affect. His behavior is normal.    ED Course  Procedures (including critical care time)  DIAGNOSTIC STUDIES: Oxygen Saturation is 94% on room air, adequate by my interpretation.    COORDINATION OF CARE: 5:39PM-Discussed discharge plan of an albuterol inhaler with pt at bedside and pt agreed to plan.   Labs Reviewed - No data to display No results found.   Date: 08/01/2012  Rate: 84  Rhythm: normal sinus rhythm  QRS Axis: normal  Intervals: normal  ST/T Wave abnormalities: nonspecific ST changes  Conduction Disutrbances:none  Narrative Interpretation:   Old EKG Reviewed: unchanged From 07/27/12   1.  Shortness of breath       MDM  Patient well-known to me frequently has a complaint of shortness of breath. No wheezing here room air saturations 94% note tachypnea. Patient claims he ran out of his albuterol inhaler we'll give him a new one. EKG has no changes. Lungs are clear bilaterally chest x-ray not required.     I personally performed the services described in this documentation, which was scribed in my presence. The recorded information has been reviewed and considered.     Shelda Jakes, MD 08/01/12 [phone]

## 2012-08-03 ENCOUNTER — Emergency Department (HOSPITAL_COMMUNITY)
Admission: EM | Admit: 2012-08-03 | Discharge: 2012-08-03 | Disposition: A | Discharge status: Home / Self Care | Payer: PRIVATE HEALTH INSURANCE | Attending: Emergency Medicine | Admitting: Emergency Medicine

## 2012-08-03 ENCOUNTER — Encounter (HOSPITAL_COMMUNITY): Payer: Self-pay | Admitting: *Deleted

## 2012-08-03 ENCOUNTER — Other Ambulatory Visit: Payer: Self-pay

## 2012-08-03 DIAGNOSIS — R062 Wheezing: Secondary | ICD-10-CM | POA: Insufficient documentation

## 2012-08-03 DIAGNOSIS — R0989 Other specified symptoms and signs involving the circulatory and respiratory systems: Secondary | ICD-10-CM | POA: Insufficient documentation

## 2012-08-03 DIAGNOSIS — Z79899 Other long term (current) drug therapy: Secondary | ICD-10-CM | POA: Insufficient documentation

## 2012-08-03 DIAGNOSIS — R0600 Dyspnea, unspecified: Secondary | ICD-10-CM

## 2012-08-03 DIAGNOSIS — I10 Essential (primary) hypertension: Secondary | ICD-10-CM | POA: Insufficient documentation

## 2012-08-03 DIAGNOSIS — R0602 Shortness of breath: Secondary | ICD-10-CM | POA: Insufficient documentation

## 2012-08-03 DIAGNOSIS — R0609 Other forms of dyspnea: Secondary | ICD-10-CM | POA: Insufficient documentation

## 2012-08-03 MED ORDER — ALBUTEROL SULFATE HFA 108 (90 BASE) MCG/ACT IN AERS
2.0000 | INHALATION_SPRAY | Freq: Once | RESPIRATORY_TRACT | Status: AC
  Administered 2012-08-03: 2 via RESPIRATORY_TRACT
  Filled 2012-08-03: qty 6.7

## 2012-08-03 MED ORDER — IPRATROPIUM BROMIDE 0.02 % IN SOLN
0.5000 mg | Freq: Once | RESPIRATORY_TRACT | Status: AC
  Administered 2012-08-03: 0.5 mg via RESPIRATORY_TRACT
  Filled 2012-08-03: qty 2.5

## 2012-08-03 MED ORDER — ALBUTEROL SULFATE (5 MG/ML) 0.5% IN NEBU
5.0000 mg | INHALATION_SOLUTION | Freq: Once | RESPIRATORY_TRACT | Status: AC
  Administered 2012-08-03: 5 mg via RESPIRATORY_TRACT
  Filled 2012-08-03: qty 1

## 2012-08-03 NOTE — ED Provider Notes (Signed)
History   This chart was scribed for [name] Lyons, MD by [name]. The patient was seen in room [facility]. Patient's care was started at 2055.  CSN: [id number]  Arrival date & time 08/03/12  2055   First MD Initiated Contact with Patient 08/03/12 2129      Chief Complaint  Patient presents with  . Shortness of Breath   Patient is a 78 y.o. male presenting with shortness of breath. The history is provided by the patient. No language interpreter was used.  Shortness of Breath  The current episode started today. The problem occurs continuously. The problem has been unchanged. The problem is moderate. The symptoms are aggravated by activity. Associated symptoms include shortness of breath. Pertinent negatives include no chest pain, no chest pressure, no orthopnea, no fever, no rhinorrhea and no sore throat. He was not exposed to toxic fumes. He has not inhaled smoke recently. He has had intermittent steroid use. His past medical history is significant for asthma and past wheezing. There were no sick contacts. Recently, medical care has been given at this facility. Services received include medications given and tests performed.    [name] is a 78 y.o. male with a h/o COPD who presents to the Emergency Department complaining of 6 hours of recurrent sudden onset moderate SOB aggravated with activity. Pt denotes onset after traveling. He has been evaluated at [facility] ED multiple times for the same symptoms and states that albuterol inhaler used PTA provide insufficient relief. Pt denies fever, chills, emesis, nausea, rash, and cough.   Past Medical History  Diagnosis Date  . COPD (chronic obstructive pulmonary disease)   . Anxiety   . Alcohol abuse   . Anemia   . Generalized headaches   . Hypertension   . 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 Pulmonary nodule/lesion, solitary   . Schizoaffective disorder   . GI bleed     History of recurrent bleeding that dates back as far as 2004 per records,    Past Surgical History  Procedure Date  . Colonoscopy Sept 2009    SLF: few small AVMs in ascending colon distal to IC . Ablated via BICAP.   Marland Kitchen Esophagogastroduodenoscopy Sept 2009    SLF: normal esophagus, small HH, 1-2 AVMs actively oozing in mid stomach, s/p BICAP, path benign   . Esophagogastroduodenoscopy 01/18/2012    Rourk-Erosive reflux esophagitis. Noncritical ring. Antral erosions/ small antral ulcer - appeared innocent  (H pylori serrology negative)  . Colonoscopy 03/09/2012    Procedure: COLONOSCOPY;  Surgeon: West Bali, MD;  [facility];  Service: Endoscopy;  Laterality: N/A;  2:30  . Esophagogastroduodenoscopy 03/09/2012    Procedure: ESOPHAGOGASTRODUODENOSCOPY (EGD);  Surgeon: West Bali, MD;  [facility];  Service: Endoscopy;  Laterality: N/A;    Family History  Problem Relation Age of Onset  . Colon cancer Neg Hx     History  Substance Use Topics  . Smoking status: Former Smoker    Types: Cigarettes    Quit date: 05/07/1969  . Smokeless tobacco: Current User    Types: Snuff  . Alcohol Use: No     ETOH-pt states quit 12/2011   Review of Systems  Constitutional: Negative for fever.  HENT: Negative for sore throat and rhinorrhea.   Respiratory: Positive for shortness of breath.   Cardiovascular: Negative for chest pain and orthopnea.  All other  systems reviewed and are negative.    Allergies  Black pepper  Home Medications   Current Outpatient Rx  Name Route Sig Dispense Refill  . ALBUTEROL SULFATE HFA 108 (90 BASE) MCG/ACT IN AERS Inhalation Inhale 2 puffs into the lungs every 6 (six) hours as needed. For shortness of breath    . DIAZEPAM 5 MG PO TABS Oral Take 5 mg by mouth 2 (two) times daily.    Marland Kitchen FERROUS SULFATE 325 (65 FE) MG PO TABS Oral Take 325 mg by mouth 3 (three) times daily with meals.    Marland Kitchen HYDROCODONE-ACETAMINOPHEN 5-325 MG PO TABS  Oral Take 1 tablet by mouth 4 (four) times daily as needed. Pain.    . IPRATROPIUM-ALBUTEROL 20-100 MCG/ACT IN AERS Inhalation Inhale 1 puff into the lungs every 6 (six) hours.    Marland Kitchen PREDNISONE 50 MG PO TABS Oral Take 50 mg by mouth daily.      BP 132/104  Pulse 84  Temp 98.3 F (36.8 C) (Oral)  Resp 22  Wt 127 lb (57.607 kg)  SpO2 92%  Physical Exam  Constitutional: No distress.  HENT:  Head: Normocephalic and atraumatic.  Mouth/Throat: Oropharynx is clear and moist. No oropharyngeal exudate.  Eyes: Pupils are equal, round, and reactive to light. Right eye exhibits no discharge. Left eye exhibits no discharge.  Neck: Normal range of motion. Neck supple. No tracheal deviation present.  Cardiovascular: Normal rate, regular rhythm and normal heart sounds.   No murmur heard. Pulmonary/Chest: Effort normal. He exhibits no tenderness.       Mild diffuse wheezing bilaterally.  Abdominal: Bowel sounds are normal. He exhibits no distension. There is no tenderness. There is no rebound.  Musculoskeletal: Normal range of motion. He exhibits no edema and no tenderness.  Neurological: He is alert.  Skin: Skin is warm. No rash noted. No erythema.    ED Course  Procedures  DIAGNOSTIC STUDIES: Oxygen Saturation is 96% on room air, normal by my interpretation.    COORDINATION OF CARE: 21:36- Evaluated Pt. Pt is awake, alert, and without distress.  Labs Reviewed - No data to display No results found.   No diagnosis found.   Date: 08/03/2012  Rate: 76  Rhythm: normal sinus rhythm  QRS Axis: normal  Intervals: normal  ST/T Wave abnormalities: normal  Conduction Disutrbances:none  Narrative Interpretation:   Old EKG Reviewed: unchanged    MDM  The patient presents here with shortness of breath and is out of an inhaler.  He says he wants a treatment, and an inhaler, and then he is leaving.  This patient is well-known to the ER for frequent visits where he presents here, a workup is  ordered, then he leaves before the tests are complete.  I will give him a new inhaler, then he can go home.       I personally performed the services described in this documentation, which was scribed in my presence. The recorded information has been reviewed and considered.      [name] Lyons, MD 08/03/12 2214

## 2012-08-03 NOTE — ED Notes (Addendum)
Sob tonight, no pain, alert, ambulatory, getting snack out of machine in lobby when brought back. Pt rode in car to [facility] with someone else.

## 2012-08-05 ENCOUNTER — Encounter (HOSPITAL_COMMUNITY): Payer: Self-pay

## 2012-08-05 ENCOUNTER — Emergency Department (HOSPITAL_COMMUNITY)
Admission: EM | Admit: 2012-08-05 | Discharge: 2012-08-05 | Disposition: A | Discharge status: Home / Self Care | Payer: PRIVATE HEALTH INSURANCE | Attending: Emergency Medicine | Admitting: Emergency Medicine

## 2012-08-05 ENCOUNTER — Other Ambulatory Visit: Payer: Self-pay

## 2012-08-05 DIAGNOSIS — Z79899 Other long term (current) drug therapy: Secondary | ICD-10-CM | POA: Insufficient documentation

## 2012-08-05 DIAGNOSIS — J449 Chronic obstructive pulmonary disease, unspecified: Secondary | ICD-10-CM | POA: Insufficient documentation

## 2012-08-05 DIAGNOSIS — R062 Wheezing: Secondary | ICD-10-CM

## 2012-08-05 DIAGNOSIS — J4489 Other specified chronic obstructive pulmonary disease: Secondary | ICD-10-CM | POA: Insufficient documentation

## 2012-08-05 DIAGNOSIS — I10 Essential (primary) hypertension: Secondary | ICD-10-CM | POA: Insufficient documentation

## 2012-08-05 MED ORDER — ALBUTEROL SULFATE HFA 108 (90 BASE) MCG/ACT IN AERS: 2.0000 | INHALATION_SPRAY | RESPIRATORY_TRACT | Status: DC | PRN

## 2012-08-05 MED ORDER — IPRATROPIUM BROMIDE 0.02 % IN SOLN
0.5000 mg | Freq: Once | RESPIRATORY_TRACT | Status: AC
  Administered 2012-08-05: 0.5 mg via RESPIRATORY_TRACT
  Filled 2012-08-05: qty 2.5

## 2012-08-05 MED ORDER — ALBUTEROL SULFATE (5 MG/ML) 0.5% IN NEBU
5.0000 mg | INHALATION_SOLUTION | Freq: Once | RESPIRATORY_TRACT | Status: AC
  Administered 2012-08-05: 5 mg via RESPIRATORY_TRACT
  Filled 2012-08-05: qty 1

## 2012-08-05 MED ORDER — ALBUTEROL SULFATE HFA 108 (90 BASE) MCG/ACT IN AERS: 2.0000 | INHALATION_SPRAY | Freq: Once | RESPIRATORY_TRACT | Status: DC

## 2012-08-05 MED ORDER — ALBUTEROL SULFATE HFA 108 (90 BASE) MCG/ACT IN AERS
INHALATION_SPRAY | RESPIRATORY_TRACT | Status: AC
  Filled 2012-08-05: qty 6.7

## 2012-08-05 NOTE — ED Notes (Signed)
Pt requests to have inhaler dispensed, notified Dr. Denton Lank, ok to dispense inhaler.

## 2012-08-05 NOTE — ED Provider Notes (Addendum)
History   This chart was scribed for Suzi Roots, MD by Charolett Bumpers . The patient was seen in room APA04/APA04. Patient's care was started at 1314.    CSN: [id number]  Arrival date & time 08/05/12  1304   First MD Initiated Contact with Patient 08/05/12 1314      Chief Complaint  Patient presents with  . Weakness  . Shortness of Breath    (Consider location/radiation/quality/duration/timing/severity/associated sxs/prior treatment) The history is provided by the patient.   [name] is a 78 y.o. male who presents to the Emergency Department complaining wheezing and SOB. Hx same, hx copd. Rare non productive cough, no other uri c/o. No fever or chills. . Pt has an inhaler but denies using it today. States same symptoms w his copd in past, denies any acute or abrupt worsening, but states he didn't use inhaler as he didn't think it would fit together correctly. No chest pain or discomfort. No associated nv or diaphoresis.    PCP: Dr. Juanetta Gosling   Past Medical History  Diagnosis Date  . COPD (chronic obstructive pulmonary disease)   . Anxiety   . Alcohol abuse   . Anemia   . Generalized headaches   . Hypertension   . 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 Pulmonary nodule/lesion, solitary   . Schizoaffective disorder   . GI bleed     History of recurrent bleeding that dates back as far as 2004 per records,    Past Surgical History  Procedure Date  . Colonoscopy Sept 2009    SLF: few small AVMs in ascending colon distal to IC . Ablated via BICAP.   Marland Kitchen Esophagogastroduodenoscopy Sept 2009    SLF: normal esophagus, small HH, 1-2 AVMs actively oozing in mid stomach, s/p BICAP, path benign   . Esophagogastroduodenoscopy 01/18/2012    Rourk-Erosive reflux esophagitis. Noncritical ring. Antral erosions/ small antral ulcer - appeared innocent  (H pylori serrology negative)  . Colonoscopy 03/09/2012   Procedure: COLONOSCOPY;  Surgeon: West Bali, MD;  Location: [facility];  Service: Endoscopy;  Laterality: N/A;  2:30  . Esophagogastroduodenoscopy 03/09/2012    Procedure: ESOPHAGOGASTRODUODENOSCOPY (EGD);  Surgeon: West Bali, MD;  Location: [facility];  Service: Endoscopy;  Laterality: N/A;    Family History  Problem Relation Age of Onset  . Colon cancer Neg Hx     History  Substance Use Topics  . Smoking status: Former Smoker    Types: Cigarettes    Quit date: 05/07/1969  . Smokeless tobacco: Current User    Types: Snuff  . Alcohol Use: No     ETOH-pt states quit 12/2011      Review of Systems  Constitutional: Negative for fever and chills.  HENT: Negative for congestion, sore throat and rhinorrhea.   Respiratory: Positive for cough and shortness of breath.   Cardiovascular: Negative for chest pain and leg swelling.  Gastrointestinal: Negative for abdominal pain.  Neurological: Positive for weakness. Negative for headaches.  All other systems reviewed and are negative.    Allergies  Black pepper  Home Medications   Current Outpatient Rx  Name Route Sig Dispense Refill  . ALBUTEROL SULFATE HFA 108 (90 BASE) MCG/ACT IN AERS Inhalation Inhale 2 puffs into the lungs every 6 (six) hours as needed. For shortness of breath    . DIAZEPAM 5 MG PO TABS Oral Take 5 mg by  mouth 2 (two) times daily.    Marland Kitchen FERROUS SULFATE 325 (65 FE) MG PO TABS Oral Take 325 mg by mouth 3 (three) times daily with meals.    Marland Kitchen HYDROCODONE-ACETAMINOPHEN 5-325 MG PO TABS Oral Take 1 tablet by mouth 4 (four) times daily as needed. Pain.    . IPRATROPIUM-ALBUTEROL 20-100 MCG/ACT IN AERS Inhalation Inhale 1 puff into the lungs every 6 (six) hours.    Marland Kitchen PREDNISONE 50 MG PO TABS Oral Take 50 mg by mouth daily.      There were no vitals taken for this visit.  Physical Exam  Nursing note and vitals reviewed. Constitutional: He is oriented to person, place, and time. He appears  well-developed and well-nourished.  HENT:  Head: Normocephalic and atraumatic.  Nose: Nose normal.  Mouth/Throat: Oropharynx is clear and moist.  Eyes: Conjunctivae normal and EOM are normal. Pupils are equal, round, and reactive to light. No scleral icterus.  Neck: Normal range of motion. Neck supple. No JVD present.  Cardiovascular: Normal rate, regular rhythm and normal heart sounds.   No murmur heard. Pulmonary/Chest: Effort normal. No respiratory distress. He has wheezes.       Mild wheezes bilaterally.   Abdominal: Soft. Bowel sounds are normal.  Musculoskeletal: Normal range of motion. He exhibits no edema and no tenderness.  Neurological: He is alert and oriented to person, place, and time. No cranial nerve deficit.  Skin: Skin is warm and dry. No rash noted.  Psychiatric: He has a normal mood and affect.    ED Course  Procedures (including critical care time)   COORDINATION OF CARE:  13;23-Discussed planned course of treatment with the patient including a breathing treatment, who is agreeable at this time.        MDM  I personally performed the services described in this documentation, which was scribed in my presence. The recorded information has been reviewed and considered. Suzi Roots, MD   Albuterol/atrovent neb.  Reviewed nursing notes and prior charts for additional history.   Pt has inhaler with him. States he hasnt used at all today, as he wasn't sure it fit his spacer/device.  Nursing demonstrated use for patient, reassured his inhaler appears to be working normally.  On review chart, pt noted w frequent ed visits for same. Denies any chest pain or discomfort. No uri c/o, no fever or chills.    Date: 08/05/2012  Rate: 93  Rhythm: normal sinus rhythm  QRS Axis: normal  Intervals: normal  ST/T Wave abnormalities: normal  Conduction Disutrbances:none  Narrative Interpretation:   Old EKG Reviewed: unchanged   Recheck wheezing improved/resolved.  No increased wob, no pain.  States pcp is Dr Juanetta Gosling, but hasnt seen recently. Given multiple ED visits, discussed importance of close pcp follow up - pt to follow up there in the next 1-2 days.   Pt feels breathing at baseline, no fever, no pain. Appears stable for d/c.   Recheck chest cta. No wheezing.    Suzi Roots, MD 08/05/12 1348  Suzi Roots, MD 08/05/12 [phone]

## 2012-08-05 NOTE — ED Notes (Signed)
Pt witnessed by staff attempting to take own vicodin from home, instructed to wait until seen by md, pt stated "he's gonna give [place] 2 hours then he's leaving", then yall can kiss my ass

## 2012-08-05 NOTE — ED Notes (Signed)
Pt c/o generalized weakness and SOB starting today.  C/O pain in back of neck.  Denies injury.

## 2012-08-06 ENCOUNTER — Emergency Department (HOSPITAL_COMMUNITY)
Admission: EM | Admit: 2012-08-06 | Discharge: 2012-08-06 | Disposition: A | Discharge status: Home / Self Care | Payer: PRIVATE HEALTH INSURANCE | Attending: Emergency Medicine | Admitting: Emergency Medicine

## 2012-08-06 ENCOUNTER — Encounter (HOSPITAL_COMMUNITY): Payer: Self-pay | Admitting: *Deleted

## 2012-08-06 DIAGNOSIS — Z800 Family history of malignant neoplasm of digestive organs: Secondary | ICD-10-CM | POA: Insufficient documentation

## 2012-08-06 DIAGNOSIS — I10 Essential (primary) hypertension: Secondary | ICD-10-CM | POA: Insufficient documentation

## 2012-08-06 DIAGNOSIS — F259 Schizoaffective disorder, unspecified: Secondary | ICD-10-CM | POA: Insufficient documentation

## 2012-08-06 DIAGNOSIS — Z91018 Allergy to other foods: Secondary | ICD-10-CM | POA: Insufficient documentation

## 2012-08-06 DIAGNOSIS — F411 Generalized anxiety disorder: Secondary | ICD-10-CM | POA: Insufficient documentation

## 2012-08-06 DIAGNOSIS — J4489 Other specified chronic obstructive pulmonary disease: Secondary | ICD-10-CM | POA: Insufficient documentation

## 2012-08-06 DIAGNOSIS — J449 Chronic obstructive pulmonary disease, unspecified: Secondary | ICD-10-CM | POA: Insufficient documentation

## 2012-08-06 DIAGNOSIS — F17200 Nicotine dependence, unspecified, uncomplicated: Secondary | ICD-10-CM | POA: Insufficient documentation

## 2012-08-06 MED ORDER — ALBUTEROL SULFATE (5 MG/ML) 0.5% IN NEBU
INHALATION_SOLUTION | RESPIRATORY_TRACT | Status: AC
  Filled 2012-08-06: qty 0.5

## 2012-08-06 MED ORDER — ALBUTEROL SULFATE HFA 108 (90 BASE) MCG/ACT IN AERS
INHALATION_SPRAY | RESPIRATORY_TRACT | Status: AC
  Filled 2012-08-06: qty 6.7

## 2012-08-06 MED ORDER — ALBUTEROL SULFATE (5 MG/ML) 0.5% IN NEBU
2.5000 mg | INHALATION_SOLUTION | Freq: Once | RESPIRATORY_TRACT | Status: AC
  Administered 2012-08-06: 2.5 mg via RESPIRATORY_TRACT
  Filled 2012-08-06: qty 0.5

## 2012-08-06 NOTE — ED Provider Notes (Signed)
History   Scribed for [employer]. Bernette Mayers, MD, the patient was seen in room APA03/APA03 . This chart was scribed by Lewanda Rife.    CSN: [id number]  Arrival date & time 08/06/12  1358   First MD Initiated Contact with Patient 08/06/12 1502      Chief Complaint  Patient presents with  . Shortness of Breath    (Consider location/radiation/quality/duration/timing/severity/associated sxs/prior treatment) HPI [name] is a 78 y.o. male who presents to the Emergency Department complaining of shortness of breath. Pt has history of COPD, numerous ED visits for same. States did not use his inhaler today and has not been to see his PCP recently. States has been feeling SOB today and rode his lawnmower to the ED for evaluation.    Past Medical History  Diagnosis Date  . COPD (chronic obstructive pulmonary disease)   . Anxiety   . Alcohol abuse   . Anemia   . Generalized headaches   . Hypertension   . 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 Pulmonary nodule/lesion, solitary   . Schizoaffective disorder   . GI bleed     History of recurrent bleeding that dates back as far as 2004 per records,    Past Surgical History  Procedure Date  . Colonoscopy Sept 2009    SLF: few small AVMs in ascending colon distal to IC . Ablated via BICAP.   Marland Kitchen Esophagogastroduodenoscopy Sept 2009    SLF: normal esophagus, small HH, 1-2 AVMs actively oozing in mid stomach, s/p BICAP, path benign   . Esophagogastroduodenoscopy 01/18/2012    Rourk-Erosive reflux esophagitis. Noncritical ring. Antral erosions/ small antral ulcer - appeared innocent  (H pylori serrology negative)  . Colonoscopy 03/09/2012    Procedure: COLONOSCOPY;  Surgeon: West Bali, MD;  Location: [facility];  Service: Endoscopy;  Laterality: N/A;  2:30  . Esophagogastroduodenoscopy 03/09/2012    Procedure: ESOPHAGOGASTRODUODENOSCOPY (EGD);  Surgeon: West Bali, MD;   Location: [facility];  Service: Endoscopy;  Laterality: N/A;    Family History  Problem Relation Age of Onset  . Colon cancer Neg Hx     History  Substance Use Topics  . Smoking status: Former Smoker    Types: Cigarettes    Quit date: 05/07/1969  . Smokeless tobacco: Current User    Types: Snuff  . Alcohol Use: No     ETOH-pt states quit 12/2011      Review of Systems All other systems reviewed and are negative except as noted in HPI.   Allergies  Black pepper  Home Medications   Current Outpatient Rx  Name Route Sig Dispense Refill  . DIAZEPAM 5 MG PO TABS Oral Take 5 mg by mouth 2 (two) times daily.    Marland Kitchen FERROUS SULFATE 325 (65 FE) MG PO TABS Oral Take 325 mg by mouth 3 (three) times daily with meals.    Marland Kitchen HYDROCODONE-ACETAMINOPHEN 5-325 MG PO TABS Oral Take 1 tablet by mouth 4 (four) times daily as needed. Pain.    Marland Kitchen PREDNISONE 50 MG PO TABS Oral Take 50 mg by mouth daily.    . ALBUTEROL SULFATE HFA 108 (90 BASE) MCG/ACT IN AERS Inhalation Inhale 2 puffs into the lungs every 4 (four) hours as needed for wheezing. 1 Inhaler 3  . ALBUTEROL SULFATE HFA 108 (90 BASE) MCG/ACT IN AERS Inhalation Inhale 2 puffs into the lungs every 6 (six) hours  as needed. For shortness of breath    . IPRATROPIUM-ALBUTEROL 20-100 MCG/ACT IN AERS Inhalation Inhale 1 puff into the lungs every 6 (six) hours.      BP 113/80  Pulse 100  Temp 98.3 F (36.8 C) (Oral)  Resp 24  Ht 5\' 3"  (1.6 m)  Wt 120 lb (54.432 kg)  BMI 21.26 kg/m2  SpO2 95%  Physical Exam  Nursing note and vitals reviewed. Constitutional: He is oriented to person, place, and time. He appears well-developed and well-nourished.  HENT:  Head: Normocephalic and atraumatic.  Eyes: EOM are normal. Pupils are equal, round, and reactive to light.  Neck: Normal range of motion. Neck supple.  Cardiovascular: Normal rate, normal heart sounds and intact distal pulses.   Pulmonary/Chest: Effort normal and breath sounds normal.    Abdominal: Bowel sounds are normal. He exhibits no distension. There is no tenderness.  Musculoskeletal: Normal range of motion. He exhibits no edema and no tenderness.  Neurological: He is alert and oriented to person, place, and time. He has normal strength. No cranial nerve deficit or sensory deficit.  Skin: Skin is warm and dry. No rash noted.  Psychiatric: He has a normal mood and affect.    ED Course  Procedures (including critical care time)  Labs Reviewed - No data to display No results found.   No diagnosis found.    MDM  Neb ordered prior to my evaluation in progress at the time of my eval, but patient has normal exam. Review of multiple recent ED visits reveals similar presentations. Advised to see PCP.      I personally performed the services described in the documentation, which were scribed in my presence. The recorded information has been reviewed and considered.     Naomii Kreger B. Bernette Mayers, MD 08/06/12 [phone]

## 2012-08-06 NOTE — ED Notes (Signed)
Pt in BR 

## 2012-08-06 NOTE — ED Notes (Signed)
Sob, sudden onset.pain "back of my neck"

## 2012-08-06 NOTE — ED Notes (Signed)
Patient denies pain and is resting comfortably.  

## 2012-08-08 ENCOUNTER — Encounter (HOSPITAL_COMMUNITY): Payer: Self-pay

## 2012-08-08 ENCOUNTER — Emergency Department (HOSPITAL_COMMUNITY): Payer: PRIVATE HEALTH INSURANCE

## 2012-08-08 ENCOUNTER — Emergency Department (HOSPITAL_COMMUNITY)
Admission: EM | Admit: 2012-08-08 | Discharge: 2012-08-08 | Payer: PRIVATE HEALTH INSURANCE | Attending: Emergency Medicine | Admitting: Emergency Medicine

## 2012-08-08 DIAGNOSIS — Z79899 Other long term (current) drug therapy: Secondary | ICD-10-CM | POA: Insufficient documentation

## 2012-08-08 DIAGNOSIS — J449 Chronic obstructive pulmonary disease, unspecified: Secondary | ICD-10-CM | POA: Insufficient documentation

## 2012-08-08 DIAGNOSIS — F1010 Alcohol abuse, uncomplicated: Secondary | ICD-10-CM | POA: Insufficient documentation

## 2012-08-08 DIAGNOSIS — F10929 Alcohol use, unspecified with intoxication, unspecified: Secondary | ICD-10-CM

## 2012-08-08 DIAGNOSIS — R4182 Altered mental status, unspecified: Secondary | ICD-10-CM | POA: Insufficient documentation

## 2012-08-08 DIAGNOSIS — I10 Essential (primary) hypertension: Secondary | ICD-10-CM | POA: Insufficient documentation

## 2012-08-08 DIAGNOSIS — J4489 Other specified chronic obstructive pulmonary disease: Secondary | ICD-10-CM | POA: Insufficient documentation

## 2012-08-08 DIAGNOSIS — R0602 Shortness of breath: Secondary | ICD-10-CM | POA: Insufficient documentation

## 2012-08-08 LAB — COMPREHENSIVE METABOLIC PANEL
ALT: 15 U/L (ref 0–53)
Alkaline Phosphatase: 71 U/L (ref 39–117)
Chloride: 104 mEq/L (ref 96–112)
GFR calc Af Amer: >90 mL/min (ref >90)
Glucose, Bld: 65 mg/dL — ABNORMAL LOW (ref 70–99)
Potassium: 3.5 mEq/L (ref 3.5–5.1)
Sodium: 140 mEq/L (ref 135–145)
Total Bilirubin: 0.2 mg/dL — ABNORMAL LOW (ref 0.3–1.2)
Total Protein: 7.1 g/dL (ref 6.0–8.3)

## 2012-08-08 LAB — CBC WITH DIFFERENTIAL/PLATELET
Eosinophils Absolute: 0.1 10*3/uL (ref 0.0–0.7)
Hemoglobin: 9 g/dL — ABNORMAL LOW (ref 13.0–17.0)
Lymphocytes Relative: 27 % (ref 12–46)
Lymphs Abs: 0.8 10*3/uL (ref 0.7–4.0)
Monocytes Relative: 16 % — ABNORMAL HIGH (ref 3–12)
Neutro Abs: 1.5 10*3/uL — ABNORMAL LOW (ref 1.7–7.7)
Neutrophils Relative %: 51 % (ref 43–77)
Platelets: 518 10*3/uL — ABNORMAL HIGH (ref 150–400)
RBC: 3.95 MIL/uL — ABNORMAL LOW (ref 4.22–5.81)
WBC: 2.9 10*3/uL — ABNORMAL LOW (ref 4.0–10.5)

## 2012-08-08 LAB — BLOOD GAS, ARTERIAL
Acid-base deficit: 1.5 mmol/L (ref 0.0–2.0)
O2 Saturation: 89 %
TCO2: 21.7 mmol/L (ref 0–100)
pCO2 arterial: 38.7 mmHg (ref 35.0–45.0)
pO2, Arterial: 62.3 mmHg — ABNORMAL LOW (ref 80.0–100.0)

## 2012-08-08 NOTE — ED Provider Notes (Signed)
History     CSN: [id number]  Arrival date & time 08/08/12  1439   First MD Initiated Contact with Patient 08/08/12 1507      Chief Complaint  Patient presents with  . Alcohol Intoxication     HPI Pt was seen at 1550.  Per pt, c/o gradual onset and persistence of constant generalized "weakness" and fatigue since he woke up this morning.  Pt states he "drank some beers" because he "thought that would help."  Denies focal motor weakness, no tingling/numbness in extremities, no CP/palpitations, no cough/SOB, no abd pain, no N/V/D, no fever.    Past Medical History  Diagnosis Date  . COPD (chronic obstructive pulmonary disease)   . Anxiety   . Alcohol abuse   . Anemia   . Generalized headaches   . Hypertension   . 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 Pulmonary nodule/lesion, solitary   . Schizoaffective disorder   . GI bleed     History of recurrent bleeding that dates back as far as 2004 per records,    Past Surgical History  Procedure Date  . Colonoscopy Sept 2009    SLF: few small AVMs in ascending colon distal to IC . Ablated via BICAP.   Marland Kitchen Esophagogastroduodenoscopy Sept 2009    SLF: normal esophagus, small HH, 1-2 AVMs actively oozing in mid stomach, s/p BICAP, path benign   . Esophagogastroduodenoscopy 01/18/2012    Rourk-Erosive reflux esophagitis. Noncritical ring. Antral erosions/ small antral ulcer - appeared innocent  (H pylori serrology negative)  . Colonoscopy 03/09/2012    Procedure: COLONOSCOPY;  Surgeon: West Bali, MD;  Location: [facility];  Service: Endoscopy;  Laterality: N/A;  2:30  . Esophagogastroduodenoscopy 03/09/2012    Procedure: ESOPHAGOGASTRODUODENOSCOPY (EGD);  Surgeon: West Bali, MD;  Location: [facility];  Service: Endoscopy;  Laterality: N/A;    Family History  Problem Relation Age of Onset  . Colon cancer Neg Hx     History  Substance Use Topics  . Smoking status:  Former Smoker    Types: Cigarettes    Quit date: 05/07/1969  . Smokeless tobacco: Current User    Types: Snuff  . Alcohol Use: No     ETOH-pt states quit 12/2011      Review of Systems ROS: Statement: All systems negative except as marked or noted in the HPI; Constitutional: Negative for fever and chills. ; ; Eyes: Negative for eye pain, redness and discharge. ; ; ENMT: Negative for ear pain, hoarseness, nasal congestion, sinus pressure and sore throat. ; ; Cardiovascular: Negative for chest pain, palpitations, diaphoresis, dyspnea and peripheral edema. ; ; Respiratory: Negative for cough, wheezing and stridor. ; ; Gastrointestinal: Negative for nausea, vomiting, diarrhea, abdominal pain, blood in stool, hematemesis, jaundice and rectal bleeding. . ; ; Genitourinary: Negative for dysuria, flank pain and hematuria. ; ; Musculoskeletal: Negative for back pain and neck pain. Negative for swelling and trauma.; ; Skin: Negative for pruritus, rash, abrasions, blisters, bruising and skin lesion.; ; Neuro: +generalized weakness. Negative for headache, lightheadedness and neck stiffness. Negative for altered level of consciousness , altered mental status, extremity weakness, paresthesias, involuntary movement, seizure and syncope.       Allergies  Black pepper  Home Medications   Current Outpatient Rx  Name Route Sig Dispense Refill  . ALBUTEROL SULFATE HFA 108 (90 BASE) MCG/ACT IN AERS Inhalation Inhale 2 puffs into  the lungs every 4 (four) hours as needed for wheezing. 1 Inhaler 3  . DIAZEPAM 5 MG PO TABS Oral Take 5 mg by mouth 2 (two) times daily.    Marland Kitchen LEVALBUTEROL TARTRATE 45 MCG/ACT IN AERO Inhalation Inhale 1-2 puffs into the lungs 2 (two) times daily. *Uses with an Aerochamber*    . HYDROCODONE-ACETAMINOPHEN 5-325 MG PO TABS Oral Take 1 tablet by mouth 4 (four) times daily as needed. Pain.    . IPRATROPIUM-ALBUTEROL 20-100 MCG/ACT IN AERS Inhalation Inhale 1 puff into the lungs every 6  (six) hours.      BP 114/98  Pulse 98  Temp 97.9 F (36.6 C)  Resp 18  SpO2 95%  Physical Exam 1555: Physical examination:  Nursing notes reviewed; Vital signs and O2 SAT reviewed;  Constitutional: Well developed, Well nourished, Well hydrated, In no acute distress; Head:  Normocephalic, atraumatic; Eyes: EOMI, PERRL, No scleral icterus; ENMT: Mouth and pharynx normal, Mucous membranes moist; Neck: Supple, Full range of motion, No lymphadenopathy; Cardiovascular: Regular rate and rhythm, No gallop; Respiratory: Breath sounds clear & equal bilaterally, No wheezes.  Speaking full sentences with ease, Normal respiratory effort/excursion; Chest: Nontender, Movement normal; Abdomen: Soft, Nontender, Nondistended, Normal bowel sounds;; Extremities: Pulses normal, No tenderness, No edema, No calf edema or asymmetry.; Neuro: AA&Ox3, Major CN grossly intact.  Speech clear. No gross focal motor or sensory deficits in extremities. Gait steady; Skin: Color normal, Warm, Dry.    ED Course  Procedures   MDM  MDM Reviewed: previous chart, nursing note and vitals Reviewed previous: labs Interpretation: labs, x-ray and CT scan     Results for orders placed during the hospital encounter of 08/08/12  CBC WITH DIFFERENTIAL      Component Value Range   WBC 2.9 (*) 4.0 - 10.5 K/uL   RBC 3.95 (*) 4.22 - 5.81 MIL/uL   Hemoglobin 9.0 (*) 13.0 - 17.0 g/dL   HCT 04.5 (*) 40.9 - 81.1 %   MCV 76.7 (*) 78.0 - 100.0 fL   MCH 22.8 (*) 26.0 - 34.0 pg   MCHC 29.7 (*) 30.0 - 36.0 g/dL   RDW 91.4 (*) 78.2 - 95.6 %   Platelets 518 (*) 150 - 400 K/uL   Neutrophils Relative 51  43 - 77 %   Neutro Abs 1.5 (*) 1.7 - 7.7 K/uL   Lymphocytes Relative 27  12 - 46 %   Lymphs Abs 0.8  0.7 - 4.0 K/uL   Monocytes Relative 16 (*) 3 - 12 %   Monocytes Absolute 0.5  0.1 - 1.0 K/uL   Eosinophils Relative 5  0 - 5 %   Eosinophils Absolute 0.1  0.0 - 0.7 K/uL   Basophils Relative 1  0 - 1 %   Basophils Absolute 0.0  0.0 -  0.1 K/uL  COMPREHENSIVE METABOLIC PANEL      Component Value Range   Sodium 140  135 - 145 mEq/L   Potassium 3.5  3.5 - 5.1 mEq/L   Chloride 104  96 - 112 mEq/L   CO2 26  19 - 32 mEq/L   Glucose, Bld 65 (*) 70 - 99 mg/dL   BUN 8  6 - 23 mg/dL   Creatinine, Ser 2.13  0.50 - 1.35 mg/dL   Calcium 9.7  8.4 - 08.6 mg/dL   Total Protein 7.1  6.0 - 8.3 g/dL   Albumin 3.4 (*) 3.5 - 5.2 g/dL   AST 27  0 - 37 U/L   ALT  15  0 - 53 U/L   Alkaline Phosphatase 71  39 - 117 U/L   Total Bilirubin 0.2 (*) 0.3 - 1.2 mg/dL   GFR calc non Af Amer 80 (*) >90 mL/min   GFR calc Af Amer >90  >90 mL/min  ETHANOL      Component Value Range   Alcohol, Ethyl (B) 175 (*) 0 - 11 mg/dL  BLOOD GAS, ARTERIAL      Component Value Range   FIO2 21.00     pH, Arterial 7.386  7.350 - 7.450   pCO2 arterial 38.7  35.0 - 45.0 mmHg   pO2, Arterial 62.3 (*) 80.0 - 100.0 mmHg   Bicarbonate 22.8  20.0 - 24.0 mEq/L   TCO2 21.7  0 - 100 mmol/L   Acid-base deficit 1.5  0.0 - 2.0 mmol/L   O2 Saturation 89.0     Collection site RIGHT BRACHIAL     Drawn by COLLECTED BY RT     Sample type ARTERIAL     Allens test (pass/fail) PASS  PASS   Dg Chest 2 View 08/08/2012  *RADIOLOGY REPORT*  Clinical Data: Weakness.  Shortness of breath.  Altered mental status.  CHEST - 2 VIEW  Comparison: Chest x-ray 07/20/2012.  Findings: Lung volumes are normal.  No consolidative airspace disease.  No pleural effusions.  No pneumothorax.  No pulmonary nodule or mass noted.  Pulmonary vasculature and the cardiomediastinal silhouette are within normal limits. Atherosclerosis in the thoracic aorta.  IMPRESSION: 1. No radiographic evidence of acute cardiopulmonary disease. 2.  Atherosclerosis.   Original Report Authenticated By: Florencia Reasons, M.D.    Ct Head Wo Contrast 08/08/2012  *RADIOLOGY REPORT*  Clinical Data: Weakness.  Altered mental status.  CT HEAD WITHOUT CONTRAST  Technique:  Contiguous axial images were obtained from the base of the  skull through the vertex without contrast.  Comparison: None.  Findings: No intracranial hemorrhage.  Global atrophy without hydrocephalus.  Small vessel disease type changes without CT evidence of large acute infarct.  No intracranial mass lesion detected on this unenhanced exam.  Vascular calcifications.  IMPRESSION: No intracranial hemorrhage or CT evidence of large acute infarct. Please see above.   Original Report Authenticated By: Fuller Canada, M.D.    Results for MASE, DHONDT (MRN [id number]) as of 08/08/2012 18:01  Ref. Range 05/09/2012 22:50 08/08/2012 16:40  Sample type No range found ARTERIAL ARTERIAL  Delivery systems No range found NASAL CANNULA   FIO2 No range found  21.00  O2 Content No range found 2.0   pH, Arterial Latest Range: 7.350-7.450  7.361 7.386  pCO2 arterial Latest Range: 35.0-45.0 mmHg 41.5 38.7  pO2, Arterial Latest Range: 80.0-100.0 mmHg 65.8 (L) 62.3 (L)  Bicarbonate Latest Range: 20.0-24.0 mEq/L 22.9 22.8  TCO2 Latest Range: 0-100 mmol/L 21.4 21.7  Acid-base deficit Latest Range: 0.0-2.0 mmol/L 1.7 1.5  O2 Saturation No range found 91.0 89.0  Patient temperature No range found 37.0   Collection site No range found RIGHT RADIAL RIGHT BRACHIAL  Allens test (pass/fail) Latest Range: PASS  PASS PASS   Results for COURTLAND, REAS (MRN [id number]) as of 08/08/2012 18:01 Results for SHUAN, STATZER (MRN [id number]) as of 08/08/2012 18:01  Ref. Range 04/04/2012 12:55 05/09/2012 22:45 05/10/2012 06:36 07/19/2012 20:38 07/20/2012 13:38 07/27/2012 02:25 08/08/2012 15:16  WBC Latest Range: 4.0-10.5 K/uL 3.1 (L) 3.8 (L) 3.0 (L) 3.4 (L) 3.9 (L) 4.2 2.9 (L)  Hemoglobin Latest Range: 13.0-17.0 g/dL 08.6 (L) 57.8 (L) 46.9 (L) 7.9 (L)  8.3 (L) 8.5 (L) 9.0 (L)  HCT Latest Range: 39.0-52.0 % 33.6 (L) 32.5 (L) 31.5 (L) 25.1 (L) 26.4 (L) 26.8 (L) 30.3 (L)      1745:  Pt has been climbing off his stretcher, walking around his exam room, resps easy, gait steady. Pt becoming more and more  belligerent to staff, yelling and screaming.  Now walking around the ED screaming he was "leaving" and going to drive himself home.  Pt apparently refuses to call a family member to come pick him up.  Pt intoxicated. Known anemia, leukopenia and hypoxia; as well as known non-compliance with treatment.  Pt has been eval in the ED 29 times in the past 6 months for SOB, weakness, fatigue; usually refuses any workup and leaves AMA.  Has been admitted twice for SOB/COPD and anemia; again leaves AMA.  Police called for pt and staff safety.  Pt refuses to calm himself and insists he is going to drive himself home.  Police d/c pt with them.           Laray Anger, DO 08/11/12 1007

## 2012-08-08 NOTE — ED Notes (Signed)
Pt reports that he woke up "feeling weak".  Pt says that he drank a beer to help with his weakness.

## 2012-08-11 ENCOUNTER — Encounter (HOSPITAL_COMMUNITY): Payer: Self-pay | Admitting: Emergency Medicine

## 2012-08-11 ENCOUNTER — Emergency Department (HOSPITAL_COMMUNITY)
Admission: EM | Admit: 2012-08-11 | Discharge: 2012-08-11 | Disposition: A | Discharge status: Home / Self Care | Payer: PRIVATE HEALTH INSURANCE | Attending: Emergency Medicine | Admitting: Emergency Medicine

## 2012-08-11 DIAGNOSIS — J4489 Other specified chronic obstructive pulmonary disease: Secondary | ICD-10-CM | POA: Insufficient documentation

## 2012-08-11 DIAGNOSIS — I10 Essential (primary) hypertension: Secondary | ICD-10-CM | POA: Insufficient documentation

## 2012-08-11 DIAGNOSIS — R079 Chest pain, unspecified: Secondary | ICD-10-CM | POA: Insufficient documentation

## 2012-08-11 DIAGNOSIS — R0602 Shortness of breath: Secondary | ICD-10-CM

## 2012-08-11 DIAGNOSIS — Z79899 Other long term (current) drug therapy: Secondary | ICD-10-CM | POA: Insufficient documentation

## 2012-08-11 DIAGNOSIS — J449 Chronic obstructive pulmonary disease, unspecified: Secondary | ICD-10-CM | POA: Insufficient documentation

## 2012-08-11 MED ORDER — ALBUTEROL SULFATE HFA 108 (90 BASE) MCG/ACT IN AERS
2.0000 | INHALATION_SPRAY | Freq: Four times a day (QID) | RESPIRATORY_TRACT | Status: DC | PRN
  Administered 2012-08-11: 2 via RESPIRATORY_TRACT
  Filled 2012-08-11: qty 6.7

## 2012-08-11 NOTE — ED Provider Notes (Signed)
History     CSN: [id number]  Arrival date & time 08/11/12  1552   First MD Initiated Contact with Patient 08/11/12 1601      Chief Complaint  Patient presents with  . Shortness of Breath    (Consider location/radiation/quality/duration/timing/severity/associated sxs/prior treatment) The history is provided by the patient.  patient is 78 year old male primary care provider is Dr. Juanetta Gosling presents with a complaint of shortness of breath and some mild chest discomfort since this morning. Patient is seen frequently for this complaint. Chest pain is now resolved just has shortness of breath nursing note complaint dose for generalized weakness and fatigue but to [place] he talks about some mild chest discomfort on the left which has resolved and shortness of breath.  Past Medical History  Diagnosis Date  . COPD (chronic obstructive pulmonary disease)   . Anxiety   . Alcohol abuse   . Anemia   . Generalized headaches   . Hypertension   . 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 Pulmonary nodule/lesion, solitary   . Schizoaffective disorder   . GI bleed     History of recurrent bleeding that dates back as far as 2004 per records,    Past Surgical History  Procedure Date  . Colonoscopy Sept 2009    SLF: few small AVMs in ascending colon distal to IC . Ablated via BICAP.   Marland Kitchen Esophagogastroduodenoscopy Sept 2009    SLF: normal esophagus, small HH, 1-2 AVMs actively oozing in mid stomach, s/p BICAP, path benign   . Esophagogastroduodenoscopy 01/18/2012    Rourk-Erosive reflux esophagitis. Noncritical ring. Antral erosions/ small antral ulcer - appeared innocent  (H pylori serrology negative)  . Colonoscopy 03/09/2012    Procedure: COLONOSCOPY;  Surgeon: West Bali, MD;  Location: [facility];  Service: Endoscopy;  Laterality: N/A;  2:30  . Esophagogastroduodenoscopy 03/09/2012    Procedure: ESOPHAGOGASTRODUODENOSCOPY (EGD);   Surgeon: West Bali, MD;  Location: [facility];  Service: Endoscopy;  Laterality: N/A;    Family History  Problem Relation Age of Onset  . Colon cancer Neg Hx     History  Substance Use Topics  . Smoking status: Former Smoker    Types: Cigarettes    Quit date: 05/07/1969  . Smokeless tobacco: Current User    Types: Snuff  . Alcohol Use: No     ETOH-pt states quit 12/2011      Review of Systems  Constitutional: Negative for fever and fatigue.  HENT: Negative for congestion.   Eyes: Negative for visual disturbance.  Respiratory: Positive for shortness of breath. Negative for cough.   Cardiovascular: Positive for chest pain.  Gastrointestinal: Negative for nausea, vomiting and abdominal pain.  Genitourinary: Negative for dysuria.  Musculoskeletal: Negative for back pain.  Skin: Negative for rash.  Neurological: Negative for weakness and headaches.  Hematological: Does not bruise/bleed easily.    Allergies  Black pepper  Home Medications   Current Outpatient Rx  Name Route Sig Dispense Refill  . ALBUTEROL SULFATE HFA 108 (90 BASE) MCG/ACT IN AERS Inhalation Inhale 2 puffs into the lungs every 4 (four) hours as needed for wheezing. 1 Inhaler 3  . DIAZEPAM 5 MG PO TABS Oral Take 5 mg by mouth 2 (two) times daily.    Marland Kitchen HYDROCODONE-ACETAMINOPHEN 5-325 MG PO TABS Oral Take 1 tablet by mouth 4 (four) times daily as needed. Pain.    Maximino Green[place]  20-100 MCG/ACT IN AERS Inhalation Inhale 1 puff into the lungs every 6 (six) hours.    Marland Kitchen LEVALBUTEROL TARTRATE 45 MCG/ACT IN AERO Inhalation Inhale 1-2 puffs into the lungs 2 (two) times daily. *Uses with an Aerochamber*      BP 132/80  Pulse 89  Temp 97.9 F (36.6 C) (Oral)  Resp 22  Wt 120 lb (54.432 kg)  SpO2 91%  Physical Exam  Nursing note and vitals reviewed. Constitutional: He is oriented to person, place, and time. He appears well-developed and well-nourished.  HENT:  Head: Normocephalic and  atraumatic.  Eyes: Conjunctivae normal and EOM are normal. Pupils are equal, round, and reactive to light.  Neck: Normal range of motion. Neck supple.  Cardiovascular: Normal rate, regular rhythm and normal heart sounds.   No murmur heard. Pulmonary/Chest: Effort normal and breath sounds normal. No respiratory distress. He has no wheezes. He has no rales. He exhibits no tenderness.  Abdominal: Soft. Bowel sounds are normal. There is no tenderness.  Musculoskeletal: Normal range of motion. He exhibits no edema.  Neurological: He is alert and oriented to person, place, and time. No cranial nerve deficit. He exhibits normal muscle tone. Coordination normal.  Skin: Skin is warm. No rash noted.    ED Course  Procedures (including critical care time)  Labs Reviewed - No data to display No results found.    Date: 08/11/2012  Rate: 87  Rhythm: normal sinus rhythm  QRS Axis: normal  Intervals: normal  ST/T Wave abnormalities: normal  Conduction Disutrbances:none  Narrative Interpretation:   Old EKG Reviewed: unchanged From 08/05/12   1. Shortness of breath       MDM   Patient comes in frequently to complain of shortness of breath. No wheezing no evidence of tachypnea patient's room air sats are good. Patient in no acute distress. EKG without any changes from just one week ago. Patient's albuterol inhaler is empty we'll renew that.        Shelda Jakes, MD 08/11/12 [phone]

## 2012-08-11 NOTE — ED Notes (Signed)
Pt refusing to have blood drawn.

## 2012-08-11 NOTE — ED Notes (Signed)
Pt c/o sob today

## 2012-08-13 ENCOUNTER — Encounter (HOSPITAL_COMMUNITY): Payer: Self-pay

## 2012-08-13 ENCOUNTER — Emergency Department (HOSPITAL_COMMUNITY)
Admission: EM | Admit: 2012-08-13 | Discharge: 2012-08-13 | Disposition: A | Discharge status: Home / Self Care | Payer: PRIVATE HEALTH INSURANCE | Attending: Emergency Medicine | Admitting: Emergency Medicine

## 2012-08-13 DIAGNOSIS — J4489 Other specified chronic obstructive pulmonary disease: Secondary | ICD-10-CM | POA: Insufficient documentation

## 2012-08-13 DIAGNOSIS — Z91018 Allergy to other foods: Secondary | ICD-10-CM | POA: Insufficient documentation

## 2012-08-13 DIAGNOSIS — F411 Generalized anxiety disorder: Secondary | ICD-10-CM | POA: Insufficient documentation

## 2012-08-13 DIAGNOSIS — J449 Chronic obstructive pulmonary disease, unspecified: Secondary | ICD-10-CM | POA: Insufficient documentation

## 2012-08-13 DIAGNOSIS — F259 Schizoaffective disorder, unspecified: Secondary | ICD-10-CM | POA: Insufficient documentation

## 2012-08-13 DIAGNOSIS — Z800 Family history of malignant neoplasm of digestive organs: Secondary | ICD-10-CM | POA: Insufficient documentation

## 2012-08-13 DIAGNOSIS — Z87891 Personal history of nicotine dependence: Secondary | ICD-10-CM | POA: Insufficient documentation

## 2012-08-13 DIAGNOSIS — I10 Essential (primary) hypertension: Secondary | ICD-10-CM | POA: Insufficient documentation

## 2012-08-13 MED ORDER — ALBUTEROL SULFATE HFA 108 (90 BASE) MCG/ACT IN AERS
2.0000 | INHALATION_SPRAY | RESPIRATORY_TRACT | Status: DC | PRN
  Administered 2012-08-13: 2 via RESPIRATORY_TRACT
  Filled 2012-08-13: qty 6.7

## 2012-08-13 NOTE — ED Provider Notes (Signed)
History   This chart was scribed for [name], MD by Sofie Rower. The patient was seen in room [facility]05 and the patient's care was started at 7:26PM    CSN: [id number]  Arrival date & time 08/13/12  1846   First MD Initiated Contact with Patient 08/13/12 1926      Chief Complaint  Patient presents with  . Shortness of Breath    (Consider location/radiation/quality/duration/timing/severity/associated sxs/prior treatment) Patient is a 78 y.o. male presenting with shortness of breath. The history is provided by the patient. No language interpreter was used.  Shortness of Breath  The current episode started today. The onset was sudden. The problem occurs occasionally. The problem has been gradually worsening. The problem is moderate. The symptoms are relieved by beta-agonist inhalers. Nothing aggravates the symptoms. Associated symptoms include cough and shortness of breath. Pertinent negatives include no fever.    PCP is Dr. Juanetta Gosling.   Past Medical History  Diagnosis Date  . COPD (chronic obstructive pulmonary disease)   . Anxiety   . Alcohol abuse   . Anemia   . Generalized headaches   . Hypertension   . 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 Pulmonary nodule/lesion, solitary   . Schizoaffective disorder   . GI bleed     History of recurrent bleeding that dates back as far as 2004 per records,    Past Surgical History  Procedure Date  . Colonoscopy Sept 2009    SLF: few small AVMs in ascending colon distal to IC . Ablated via BICAP.   Marland Kitchen Esophagogastroduodenoscopy Sept 2009    SLF: normal esophagus, small HH, 1-2 AVMs actively oozing in mid stomach, s/p BICAP, path benign   . Esophagogastroduodenoscopy 01/18/2012    Rourk-Erosive reflux esophagitis. Noncritical ring. Antral erosions/ small antral ulcer - appeared innocent  (H pylori serrology negative)  . Colonoscopy 03/09/2012    Procedure: COLONOSCOPY;   Surgeon: West Bali, MD;  [facility]: [facility];  Service: Endoscopy;  Laterality: N/A;  2:30  . Esophagogastroduodenoscopy 03/09/2012    Procedure: ESOPHAGOGASTRODUODENOSCOPY (EGD);  Surgeon: West Bali, MD;  Location: [facility];  Service: Endoscopy;  Laterality: N/A;    Family History  Problem Relation Age of Onset  . Colon cancer Neg Hx     History  Substance Use Topics  . Smoking status: Former Smoker    Types: Cigarettes    Quit date: 05/07/1969  . Smokeless tobacco: Current User    Types: Snuff  . Alcohol Use: No     ETOH-pt states quit 12/2011      Review of Systems  Constitutional: Negative for fever.  Respiratory: Positive for cough and shortness of breath.   All other systems reviewed and are negative.    Allergies  Black pepper  Home Medications   Current Outpatient Rx  Name Route Sig Dispense Refill  . ALBUTEROL SULFATE HFA 108 (90 BASE) MCG/ACT IN AERS Inhalation Inhale 2 puffs into the lungs every 4 (four) hours as needed for wheezing. 1 Inhaler 3  . DIAZEPAM 5 MG PO TABS Oral Take 5 mg by mouth 2 (two) times daily.    Marland Kitchen HYDROCODONE-ACETAMINOPHEN 5-325 MG PO TABS Oral Take 1 tablet by mouth 4 (four) times daily as needed. Pain.    . IPRATROPIUM-ALBUTEROL 20-100 MCG/ACT IN AERS Inhalation Inhale 1 puff into the lungs every 6 (six) hours.    Marland Kitchen LEVALBUTEROL  TARTRATE 45 MCG/ACT IN AERO Inhalation Inhale 1-2 puffs into the lungs 2 (two) times daily. *Uses with an Aerochamber*      BP 102/73  Pulse 99  Temp 98.1 F (36.7 C) (Oral)  Resp 23  Ht 5\' 4"  (1.626 m)  Wt 124 lb (56.246 kg)  BMI 21.28 kg/m2  SpO2 96%  Physical Exam  Nursing note and vitals reviewed. Constitutional: He is oriented to person, place, and time. He appears well-developed.       Pt appears anxious.   HENT:  Head: Normocephalic and atraumatic.  Eyes: Conjunctivae normal and EOM are normal. No scleral icterus.  Neck: Neck supple. No thyromegaly present.    Cardiovascular: Normal rate and regular rhythm.  Exam reveals no gallop and no friction rub.   No murmur heard. Pulmonary/Chest: No stridor. He has wheezes. He has no rales. He exhibits no tenderness.       Minimal wheezing bilaterally.   Abdominal: He exhibits no distension. There is no tenderness. There is no rebound.  Musculoskeletal: Normal range of motion. He exhibits no edema.  Lymphadenopathy:    He has no cervical adenopathy.  Neurological: He is oriented to person, place, and time. Coordination normal.  Skin: No rash noted. No erythema.  Psychiatric: He has a normal mood and affect. His behavior is normal.    ED Course  Procedures (including critical care time)  DIAGNOSTIC STUDIES: Oxygen Saturation is 96% on room air, normal by my interpretation.    COORDINATION OF CARE:    7:31PM- Treatment plan concerning application of breathing treatment discussed with patient. Pt agrees with treatment.   Labs Reviewed - No data to display No results found.   No diagnosis found.  Pt improved with tx  MDM  The chart was scribed for me under my direct supervision.  I personally performed the history, physical, and medical decision making and all procedures in the evaluation of this patient.[name], MD 08/13/12 2009

## 2012-08-13 NOTE — ED Notes (Signed)
Difficulty breathing. Have used up all my inhalers per pt. Have been having trouble breathing since last night per pt.

## 2012-08-14 ENCOUNTER — Emergency Department (HOSPITAL_COMMUNITY)
Admission: EM | Admit: 2012-08-14 | Discharge: 2012-08-14 | Discharge status: Against Medical Advice | Payer: PRIVATE HEALTH INSURANCE | Attending: Emergency Medicine | Admitting: Emergency Medicine

## 2012-08-14 ENCOUNTER — Emergency Department (HOSPITAL_COMMUNITY)
Admission: EM | Admit: 2012-08-14 | Discharge: 2012-08-14 | Disposition: A | Discharge status: Home / Self Care | Payer: PRIVATE HEALTH INSURANCE | Attending: Emergency Medicine | Admitting: Emergency Medicine

## 2012-08-14 ENCOUNTER — Encounter (HOSPITAL_COMMUNITY): Payer: Self-pay | Admitting: *Deleted

## 2012-08-14 DIAGNOSIS — J4489 Other specified chronic obstructive pulmonary disease: Secondary | ICD-10-CM | POA: Insufficient documentation

## 2012-08-14 DIAGNOSIS — R062 Wheezing: Secondary | ICD-10-CM | POA: Insufficient documentation

## 2012-08-14 DIAGNOSIS — I10 Essential (primary) hypertension: Secondary | ICD-10-CM | POA: Insufficient documentation

## 2012-08-14 DIAGNOSIS — J449 Chronic obstructive pulmonary disease, unspecified: Secondary | ICD-10-CM | POA: Insufficient documentation

## 2012-08-14 DIAGNOSIS — R0602 Shortness of breath: Secondary | ICD-10-CM | POA: Insufficient documentation

## 2012-08-14 MED ORDER — IPRATROPIUM BROMIDE 0.02 % IN SOLN
0.5000 mg | Freq: Once | RESPIRATORY_TRACT | Status: AC
  Administered 2012-08-14: 0.5 mg via RESPIRATORY_TRACT
  Filled 2012-08-14: qty 2.5

## 2012-08-14 MED ORDER — PREDNISONE 20 MG PO TABS
60.0000 mg | ORAL_TABLET | Freq: Once | ORAL | Status: AC
  Administered 2012-08-14: 60 mg via ORAL
  Filled 2012-08-14: qty 3

## 2012-08-14 MED ORDER — PREDNISONE 10 MG PO TABS: 20.0000 mg | ORAL_TABLET | Freq: Every day | ORAL | Status: DC

## 2012-08-14 MED ORDER — ALBUTEROL SULFATE (5 MG/ML) 0.5% IN NEBU
5.0000 mg | INHALATION_SOLUTION | Freq: Once | RESPIRATORY_TRACT | Status: AC
  Administered 2012-08-14: 5 mg via RESPIRATORY_TRACT
  Filled 2012-08-14: qty 1

## 2012-08-14 NOTE — ED Notes (Signed)
Pt alert & oriented x4, stable gait. Patient given discharge instructions, paperwork & prescription(s). Patient  instructed to stop at the registration desk to finish any additional paperwork. Patient verbalized understanding. Pt left department w/ no further questions. 

## 2012-08-14 NOTE — ED Notes (Signed)
Pt states he did not have the inhaler he got last night him. Complaining of SOB. Wheezing heard when pt exhales hard.

## 2012-08-14 NOTE — ED Provider Notes (Signed)
History   This chart was scribed for [name], MD by Gerlean Ren. This patient was seen in room APA01/[facility]A01 and the patient's care was started at 22:51.   CSN: [id number]  Arrival date & time 08/14/12  2100   First MD Initiated Contact with Patient 08/14/12 2249      Chief Complaint  Patient presents with  . Shortness of Breath    (Consider location/radiation/quality/duration/timing/severity/associated sxs/prior treatment) The history is provided by the patient. No language interpreter was used.  [name] is a 78 y.o. male who presents to the Emergency Department complaining of constant dyspnea with gradual onset this morning.  Pt denies fever, neck pain, sore throat, visual disturbance, CP, abdominal pain, nausea, emesis, diarrhea, urinary symptoms, back pain, HA, weakness, numbness and rash as associated symptoms.  Pt has a h/o COPD and frequently comes here for dyspnea.  Pt is a former smoker and denies alcohol use.      Past Medical History  Diagnosis Date  . COPD (chronic obstructive pulmonary disease)   . Anxiety   . Alcohol abuse   . Anemia   . Generalized headaches   . Hypertension   . 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 Pulmonary nodule/lesion, solitary   . Schizoaffective disorder   . GI bleed     History of recurrent bleeding that dates back as far as 2004 per records,    Past Surgical History  Procedure Date  . Colonoscopy Sept 2009    SLF: few small AVMs in ascending colon distal to IC . Ablated via BICAP.   Marland Kitchen Esophagogastroduodenoscopy Sept 2009    SLF: normal esophagus, small HH, 1-2 AVMs actively oozing in mid stomach, s/p BICAP, path benign   . Esophagogastroduodenoscopy 01/18/2012    Rourk-Erosive reflux esophagitis. Noncritical ring. Antral erosions/ small antral ulcer - appeared innocent  (H pylori serrology negative)  . Colonoscopy 03/09/2012    Procedure: COLONOSCOPY;  Surgeon:  West Bali, MD;  [facility]: [facility];  Service: Endoscopy;  Laterality: N/A;  2:30  . Esophagogastroduodenoscopy 03/09/2012    Procedure: ESOPHAGOGASTRODUODENOSCOPY (EGD);  Surgeon: West Bali, MD;  [facility]: [facility];  Service: Endoscopy;  Laterality: N/A;    Family History  Problem Relation Age of Onset  . Colon cancer Neg Hx     History  Substance Use Topics  . Smoking status: Former Smoker    Types: Cigarettes    Quit date: 05/07/1969  . Smokeless tobacco: Current User    Types: Snuff  . Alcohol Use: 2.4 oz/week    4 Cans of beer per week     ETOH-pt states quit 12/2011      Review of Systems  Constitutional: Negative for fatigue.  HENT: Negative for congestion, sinus pressure and ear discharge.   Eyes: Negative for discharge.  Respiratory: Positive for shortness of breath and wheezing. Negative for cough.   Cardiovascular: Negative for chest pain.  Gastrointestinal: Negative for abdominal pain and diarrhea.  Genitourinary: Negative for frequency and hematuria.  Musculoskeletal: Negative for back pain.  Skin: Negative for rash.  Neurological: Negative for seizures and headaches.  Hematological: Negative.   Psychiatric/Behavioral: Negative for hallucinations.    Allergies  Black pepper  Home Medications   Current Outpatient Rx  Name Route Sig Dispense Refill  . ALBUTEROL SULFATE HFA 108 (90 BASE) MCG/ACT IN AERS Inhalation Inhale 2 puffs into the lungs every  4 (four) hours as needed for wheezing. 1 Inhaler 3  . DIAZEPAM 5 MG PO TABS Oral Take 5 mg by mouth 2 (two) times daily.    Marland Kitchen HYDROCODONE-ACETAMINOPHEN 5-325 MG PO TABS Oral Take 1 tablet by mouth 4 (four) times daily as needed. Pain.    . IPRATROPIUM-ALBUTEROL 20-100 MCG/ACT IN AERS Inhalation Inhale 1 puff into the lungs every 6 (six) hours.    Marland Kitchen LEVALBUTEROL TARTRATE 45 MCG/ACT IN AERO Inhalation Inhale 1-2 puffs into the lungs 2 (two) times daily. *Uses with an Aerochamber*      BP 134/74   Pulse 91  Temp 97.9 F (36.6 C)  Resp 20  SpO2 92%  Physical Exam  Nursing note and vitals reviewed. Constitutional: He is oriented to person, place, and time. He appears well-developed.  HENT:  Head: Normocephalic and atraumatic.  Eyes: Conjunctivae normal and EOM are normal. No scleral icterus.  Neck: Neck supple. No thyromegaly present.  Cardiovascular: Normal rate and regular rhythm.  Exam reveals no gallop and no friction rub.   No murmur heard. Pulmonary/Chest: No stridor. He has wheezes. He has no rales. He exhibits no tenderness.       Minor bilateral wheezing.  Abdominal: He exhibits no distension. There is no tenderness. There is no rebound.  Musculoskeletal: Normal range of motion. He exhibits no edema.  Lymphadenopathy:    He has no cervical adenopathy.  Neurological: He is oriented to person, place, and time. Coordination normal.  Skin: No rash noted. No erythema.  Psychiatric: He has a normal mood and affect. His behavior is normal.    ED Course  Procedures (including critical care time) DIAGNOSTIC STUDIES: Oxygen Saturation is 92% on [place], adequate by my interpretation.    COORDINATION OF CARE: 22:52- Patient informed of clinical course, understands medical decision-making process, and agrees with plan.  Ordered breathing treatment and PO deltasone.      Labs Reviewed - No data to display No results found.   No diagnosis found.  Pt improved with tx  MDM   The chart was scribed for me under my direct supervision.  I personally performed the history, physical, and medical decision making and all procedures in the evaluation of this patient.[name], MD 08/14/12 [phone]

## 2012-08-14 NOTE — ED Notes (Signed)
Short of breath

## 2012-08-14 NOTE — ED Notes (Signed)
Pt explained the need for him to use his long term medications. The use of the MDI he receives here in the ER is for flare up w/ his breathing.

## 2012-08-14 NOTE — ED Notes (Signed)
Wheezing onset this am

## 2012-08-14 NOTE — ED Notes (Signed)
Unbable to locate pt in all waiting areas, told by reg staff that pt left a while ago.

## 2012-08-14 NOTE — ED Notes (Signed)
Pt stated sometimes that some of that air in his nose make him fill better. Pt placed on nasal canula & given a warm blanket.

## 2012-08-15 ENCOUNTER — Emergency Department (HOSPITAL_COMMUNITY)
Admission: EM | Admit: 2012-08-15 | Discharge: 2012-08-16 | Disposition: A | Discharge status: Home / Self Care | Payer: PRIVATE HEALTH INSURANCE | Attending: Emergency Medicine | Admitting: Emergency Medicine

## 2012-08-15 DIAGNOSIS — R0602 Shortness of breath: Secondary | ICD-10-CM | POA: Insufficient documentation

## 2012-08-15 NOTE — ED Notes (Signed)
Pt does not appear to be in distress at this time.  Pt placed in waiting to be wanded by Security prior to triage, as a safety precaution.

## 2012-08-16 ENCOUNTER — Encounter (HOSPITAL_COMMUNITY): Payer: Self-pay | Admitting: Emergency Medicine

## 2012-08-16 ENCOUNTER — Emergency Department (HOSPITAL_COMMUNITY): Payer: PRIVATE HEALTH INSURANCE

## 2012-08-16 ENCOUNTER — Encounter (HOSPITAL_COMMUNITY): Payer: Self-pay | Admitting: *Deleted

## 2012-08-16 ENCOUNTER — Emergency Department (HOSPITAL_COMMUNITY)
Admission: EM | Admit: 2012-08-16 | Discharge: 2012-08-17 | Disposition: A | Discharge status: Home / Self Care | Payer: PRIVATE HEALTH INSURANCE | Attending: Emergency Medicine | Admitting: Emergency Medicine

## 2012-08-16 DIAGNOSIS — J449 Chronic obstructive pulmonary disease, unspecified: Secondary | ICD-10-CM

## 2012-08-16 DIAGNOSIS — J4489 Other specified chronic obstructive pulmonary disease: Secondary | ICD-10-CM | POA: Insufficient documentation

## 2012-08-16 DIAGNOSIS — Z765 Malingerer [conscious simulation]: Secondary | ICD-10-CM | POA: Insufficient documentation

## 2012-08-16 NOTE — ED Notes (Signed)
Pt reports continues shortness of breath. Pt states he was seen by his PCP & was given some pill but does not know what it is.

## 2012-08-16 NOTE — ED Notes (Signed)
Patient c/o increased shortness of breath; states he needs a new inhaler.

## 2012-08-17 MED ORDER — ALBUTEROL SULFATE (5 MG/ML) 0.5% IN NEBU
5.0000 mg | INHALATION_SOLUTION | Freq: Once | RESPIRATORY_TRACT | Status: AC
  Administered 2012-08-17: 5 mg via RESPIRATORY_TRACT
  Filled 2012-08-17: qty 1

## 2012-08-17 MED ORDER — IPRATROPIUM BROMIDE 0.02 % IN SOLN
0.5000 mg | Freq: Once | RESPIRATORY_TRACT | Status: AC
  Administered 2012-08-17: 0.5 mg via RESPIRATORY_TRACT
  Filled 2012-08-17: qty 2.5

## 2012-08-17 NOTE — ED Provider Notes (Signed)
History     CSN: [id number]  Arrival date & time 08/16/12  2008   First MD Initiated Contact with Patient 08/16/12 2305      Chief Complaint  Patient presents with  . Shortness of Breath    (Consider location/radiation/quality/duration/timing/severity/associated sxs/prior treatment) HPI Comments: [name] is a 78 y.o. Male who is here for shortness of breath. This is his 32th. Visit this month. He has received 4 albuterol inhalers to go and a nebulizer several times. He, states he saw his PCP today, and was given 4 pills of unknown type. He wants another inhaler. He denies cough, chills, fever. There are no modifying factors.  Patient is a 78 y.o. male presenting with shortness of breath. The history is provided by the patient.  Shortness of Breath  Associated symptoms include shortness of breath.    Past Medical History  Diagnosis Date  . COPD (chronic obstructive pulmonary disease)   . Anxiety   . Alcohol abuse   . Anemia   . Generalized headaches   . Hypertension   . 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 Pulmonary nodule/lesion, solitary   . Schizoaffective disorder   . GI bleed     History of recurrent bleeding that dates back as far as 2004 per records,    Past Surgical History  Procedure Date  . Colonoscopy Sept 2009    SLF: few small AVMs in ascending colon distal to IC . Ablated via BICAP.   Marland Kitchen Esophagogastroduodenoscopy Sept 2009    SLF: normal esophagus, small HH, 1-2 AVMs actively oozing in mid stomach, s/p BICAP, path benign   . Esophagogastroduodenoscopy 01/18/2012    Rourk-Erosive reflux esophagitis. Noncritical ring. Antral erosions/ small antral ulcer - appeared innocent  (H pylori serrology negative)  . Colonoscopy 03/09/2012    Procedure: COLONOSCOPY;  Surgeon: West Bali, MD;  [facility]: [facility];  Service: Endoscopy;  Laterality: N/A;  2:30  . Esophagogastroduodenoscopy 03/09/2012      Procedure: ESOPHAGOGASTRODUODENOSCOPY (EGD);  Surgeon: West Bali, MD;  [facility]: [facility];  Service: Endoscopy;  Laterality: N/A;    Family History  Problem Relation Age of Onset  . Colon cancer Neg Hx     History  Substance Use Topics  . Smoking status: Former Smoker    Types: Cigarettes    Quit date: 05/07/1969  . Smokeless tobacco: Current User    Types: Snuff  . Alcohol Use: 2.4 oz/week    4 Cans of beer per week     ETOH-pt states quit 12/2011      Review of Systems  Respiratory: Positive for shortness of breath.   All other systems reviewed and are negative.    Allergies  Black pepper  Home Medications   Current Outpatient Rx  Name Route Sig Dispense Refill  . ALBUTEROL SULFATE HFA 108 (90 BASE) MCG/ACT IN AERS Inhalation Inhale 2 puffs into the lungs every 4 (four) hours as needed for wheezing. 1 Inhaler 3  . DIAZEPAM 5 MG PO TABS Oral Take 5 mg by mouth 2 (two) times daily.    Marland Kitchen HYDROCODONE-ACETAMINOPHEN 5-325 MG PO TABS Oral Take 1 tablet by mouth 4 (four) times daily as needed. Pain.    . IPRATROPIUM-ALBUTEROL 20-100 MCG/ACT IN AERS Inhalation Inhale 1 puff into the lungs every 6 (six) hours.    Marland Kitchen LEVALBUTEROL TARTRATE 45 MCG/ACT IN AERO Inhalation Inhale 1-2 puffs into  the lungs 2 (two) times daily. *Uses with an Aerochamber*    . PREDNISONE 10 MG PO TABS Oral Take 2 tablets (20 mg total) by mouth daily. 15 tablet 0    BP 131/96  Pulse 108  Temp 98.1 F (36.7 C) (Oral)  Resp 22  Wt 124 lb (56.246 kg)  SpO2 91%  Physical Exam  Nursing note and vitals reviewed. Constitutional: He is oriented to person, place, and time. He appears well-developed and well-nourished.  HENT:  Head: Normocephalic and atraumatic.  Right Ear: External ear normal.  Left Ear: External ear normal.  Eyes: Conjunctivae normal and EOM are normal. Pupils are equal, round, and reactive to light.  Neck: Normal range of motion and phonation normal. Neck supple.   Cardiovascular: Normal rate, regular rhythm, normal heart sounds and intact distal pulses.   Pulmonary/Chest: Effort normal. He has wheezes. He has no rales. He exhibits no tenderness and no bony tenderness.       Audible wheezing, that seems forced  Abdominal: Soft. Normal appearance. There is no tenderness.  Musculoskeletal: Normal range of motion.  Neurological: He is alert and oriented to person, place, and time. He has normal strength. No cranial nerve deficit or sensory deficit. He exhibits normal muscle tone. Coordination normal.  Skin: Skin is warm, dry and intact.  Psychiatric:       He is anxious, and agitated. He has difficulty waiting for treatment to ensue.    ED Course  Procedures (including critical care time)   Nebulizer ordered.   Labs Reviewed - No data to display Dg Chest [facility] 1 View  08/16/2012  *RADIOLOGY REPORT*  Clinical Data: Severe shortness of breath and COPD.  Hypertension.  PORTABLE CHEST - 1 VIEW  Comparison: 08/08/2012  Findings: Mild hyperinflation.  Patient rotated left. Midline trachea.  Heart size upper normal, with a mildly prominent transverse aorta. No pleural effusion or pneumothorax.  Clear lungs.  IMPRESSION: Mild hyperinflation, without acute disease.   Original Report Authenticated By: Consuello Bossier, M.D.      1. COPD (chronic obstructive pulmonary disease)   2. Malingering       MDM  History COPD, with wheezing. Inappropriate access to the emergency department services. Apparent inappropriate use of albuterol inhaler. He had 4 inhalers given in emergency department over 16 days. By history he has a Combivent inhaler at home, and Xopenex inhaler at home. The patient comes to the emergency department with the same complaints frequently. At this time, I do not believe he has an acute respiratory problem. He is stable for discharge with outpatient treatment.   Plan: Home Medications- usual; Home Treatments- rest; Recommended follow up- PCP  tomorrow to arrange ongoing care       Flint Melter, MD 08/17/12 [phone]

## 2012-08-18 ENCOUNTER — Emergency Department (HOSPITAL_COMMUNITY)
Admission: EM | Admit: 2012-08-18 | Discharge: 2012-08-18 | Disposition: A | Discharge status: Home / Self Care | Payer: PRIVATE HEALTH INSURANCE | Attending: Emergency Medicine | Admitting: Emergency Medicine

## 2012-08-18 ENCOUNTER — Encounter (HOSPITAL_COMMUNITY): Payer: Self-pay | Admitting: Emergency Medicine

## 2012-08-18 ENCOUNTER — Emergency Department (HOSPITAL_COMMUNITY)
Admission: EM | Admit: 2012-08-18 | Discharge: 2012-08-18 | Disposition: A | Discharge status: Home / Self Care | Payer: PRIVATE HEALTH INSURANCE

## 2012-08-18 DIAGNOSIS — F17200 Nicotine dependence, unspecified, uncomplicated: Secondary | ICD-10-CM | POA: Insufficient documentation

## 2012-08-18 DIAGNOSIS — J449 Chronic obstructive pulmonary disease, unspecified: Secondary | ICD-10-CM | POA: Insufficient documentation

## 2012-08-18 DIAGNOSIS — Z79899 Other long term (current) drug therapy: Secondary | ICD-10-CM | POA: Insufficient documentation

## 2012-08-18 DIAGNOSIS — R0602 Shortness of breath: Secondary | ICD-10-CM | POA: Insufficient documentation

## 2012-08-18 DIAGNOSIS — I10 Essential (primary) hypertension: Secondary | ICD-10-CM | POA: Insufficient documentation

## 2012-08-18 DIAGNOSIS — J029 Acute pharyngitis, unspecified: Secondary | ICD-10-CM

## 2012-08-18 DIAGNOSIS — J4489 Other specified chronic obstructive pulmonary disease: Secondary | ICD-10-CM | POA: Insufficient documentation

## 2012-08-18 DIAGNOSIS — R070 Pain in throat: Secondary | ICD-10-CM | POA: Insufficient documentation

## 2012-08-18 NOTE — ED Notes (Signed)
Called for triage x2. No answer. Registration tells RN patient left approximately 30 minutes ago to "check with drugstore to see if I can get something, if not I'm coming back"

## 2012-08-18 NOTE — ED Notes (Signed)
Patient also complaining of sore throat. Reports attempted to drink alcohol with honey to soothe throat. Also reports chewing ginger root in attempts to soothe throat.

## 2012-08-18 NOTE — ED Notes (Signed)
No answer for triage x1 

## 2012-08-18 NOTE — ED Notes (Signed)
Patient complaining of shortness of breath and wheezing. Patient states he has been using albuterol inhaler without relief.

## 2012-08-18 NOTE — ED Provider Notes (Signed)
History     CSN: [id number]  Arrival date & time 08/18/12  1909   First MD Initiated Contact with Patient 08/18/12 2034      Chief Complaint  Patient presents with  . Shortness of Breath     HPI Pt was seen at 2040.  Per pt, c/o gradual onset and persistence of constant sore throat that began today.  Denies SOB/cough, no CP/palpitations, no abd pain, no N/V/D, no fevers, no rash.     Past Medical History  Diagnosis Date  . COPD (chronic obstructive pulmonary disease)   . Anxiety   . Alcohol abuse   . Anemia   . Generalized headaches   . Hypertension   . 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 Pulmonary nodule/lesion, solitary   . Schizoaffective disorder   . GI bleed     History of recurrent bleeding that dates back as far as 2004 per records,    Past Surgical History  Procedure Date  . Colonoscopy Sept 2009    SLF: few small AVMs in ascending colon distal to IC . Ablated via BICAP.   Marland Kitchen Esophagogastroduodenoscopy Sept 2009    SLF: normal esophagus, small HH, 1-2 AVMs actively oozing in mid stomach, s/p BICAP, path benign   . Esophagogastroduodenoscopy 01/18/2012    Rourk-Erosive reflux esophagitis. Noncritical ring. Antral erosions/ small antral ulcer - appeared innocent  (H pylori serrology negative)  . Colonoscopy 03/09/2012    Procedure: COLONOSCOPY;  Surgeon: West Bali, MD;  Location: [facility];  Service: Endoscopy;  Laterality: N/A;  2:30  . Esophagogastroduodenoscopy 03/09/2012    Procedure: ESOPHAGOGASTRODUODENOSCOPY (EGD);  Surgeon: West Bali, MD;  Location: [facility];  Service: Endoscopy;  Laterality: N/A;    Family History  Problem Relation Age of Onset  . Colon cancer Neg Hx     History  Substance Use Topics  . Smoking status: Former Smoker    Types: Cigarettes    Quit date: 05/07/1969  . Smokeless tobacco: Current User    Types: Snuff  . Alcohol Use: 2.4 oz/week    4 Cans  of beer per week     ETOH-pt states quit 12/2011      Review of Systems ROS: Statement: All systems negative except as marked or noted in the HPI; Constitutional: Negative for fever and chills. ; ; Eyes: Negative for eye pain, redness and discharge. ; ; ENMT: Negative for ear pain, hoarseness, nasal congestion, sinus pressure and +sore throat. ; ; Cardiovascular: Negative for chest pain, palpitations, diaphoresis, dyspnea and peripheral edema. ; ; Respiratory: Negative for cough, wheezing and stridor. ; ; Gastrointestinal: Negative for nausea, vomiting, diarrhea, abdominal pain, blood in stool, hematemesis, jaundice and rectal bleeding. . ; ; Genitourinary: Negative for dysuria, flank pain and hematuria. ; ; Musculoskeletal: Negative for back pain and neck pain. Negative for swelling and trauma.; ; Skin: Negative for pruritus, rash, abrasions, blisters, bruising and skin lesion.; ; Neuro: Negative for headache, lightheadedness and neck stiffness. Negative for weakness, altered level of consciousness , altered mental status, extremity weakness, paresthesias, involuntary movement, seizure and syncope.       Allergies  Black pepper  Home Medications   Current Outpatient Rx  Name Route Sig Dispense Refill  . ALBUTEROL SULFATE HFA 108 (90 BASE) MCG/ACT IN AERS Inhalation Inhale 2 puffs into the lungs every 4 (four) hours as needed for wheezing. 1 Inhaler 3  .  DIAZEPAM 5 MG PO TABS Oral Take 5 mg by mouth 2 (two) times daily.    Marland Kitchen HYDROCODONE-ACETAMINOPHEN 5-325 MG PO TABS Oral Take 1 tablet by mouth 4 (four) times daily as needed. Pain.    . IPRATROPIUM-ALBUTEROL 20-100 MCG/ACT IN AERS Inhalation Inhale 1 puff into the lungs every 6 (six) hours.    Marland Kitchen LEVALBUTEROL TARTRATE 45 MCG/ACT IN AERO Inhalation Inhale 1-2 puffs into the lungs 2 (two) times daily. *Uses with an Aerochamber*    . PREDNISONE 10 MG PO TABS Oral Take 2 tablets (20 mg total) by mouth daily. 15 tablet 0    BP 119/54  Pulse 90   Temp 98.2 F (36.8 C) (Oral)  Resp 18  Ht 5\' 7"  (1.702 m)  Wt 127 lb 12.8 oz (57.97 kg)  BMI 20.02 kg/m2  SpO2 95%  Physical Exam 2045: Physical examination:  Nursing notes reviewed; Vital signs and O2 SAT reviewed;  Constitutional: Well developed, Well nourished, Well hydrated, In no acute distress; Head:  Normocephalic, atraumatic; Eyes: EOMI, PERRL, No scleral icterus; ENMT: Mouth and pharynx normal, Mucous membranes moist, no hoarse voice, no drooling, no stridor.; Neck: Supple, Full range of motion, No lymphadenopathy; Cardiovascular: Regular rate and rhythm, No gallop; Respiratory: Breath sounds clear & equal bilaterally, No wheezes.  Speaking full sentences with ease, Normal respiratory effort/excursion; Chest: Nontender, Movement normal;; Extremities: Pulses normal, No tenderness, No edema, No calf edema or asymmetry.; Neuro: AA&Ox3, Major CN grossly intact.  Speech clear. Gait steady. No gross focal motor or sensory deficits in extremities.; Skin: Color normal, Warm, Dry.   ED Course  Procedures    MDM  MDM Reviewed: nursing note, vitals and previous chart Reviewed previous: x-ray Interpretation: labs    Dg Chest Port 1 View 08/16/2012  *RADIOLOGY REPORT*  Clinical Data: Severe shortness of breath and COPD.  Hypertension.  PORTABLE CHEST - 1 VIEW  Comparison: 08/08/2012  Findings: Mild hyperinflation.  Patient rotated left. Midline trachea.  Heart size upper normal, with a mildly prominent transverse aorta. No pleural effusion or pneumothorax.  Clear lungs.  IMPRESSION: Mild hyperinflation, without acute disease.   Original Report Authenticated By: Consuello Bossier, M.D.     Results for orders placed during the hospital encounter of 08/18/12  RAPID STREP SCREEN      Component Value Range   Streptococcus, Group A Screen (Direct) NEGATIVE  NEGATIVE     2145:  Walking around ED with steady gait, easy resps, Sats 95% R/A, NAD.  VS remain stable. Lungs CTA bilat. Pt has been to  the ED 35 times in the last 6 months, 13 times this past month, 8 times this past week, left AWOL x3, and just seen in the ED 10/17.  Pt has MDI's at home from visits 10/12 and 10/14; will not dose another today.  Does not appear to be strep throat; will tx symptomatically.  Pt strongly encouraged to f/u with his PMD for good continuity of care.       Laray Anger, DO 08/20/12 1428

## 2012-08-19 ENCOUNTER — Encounter (HOSPITAL_COMMUNITY): Payer: Self-pay | Admitting: Emergency Medicine

## 2012-08-19 ENCOUNTER — Emergency Department (HOSPITAL_COMMUNITY)
Admission: EM | Admit: 2012-08-19 | Discharge: 2012-08-19 | Disposition: A | Discharge status: Home / Self Care | Payer: PRIVATE HEALTH INSURANCE

## 2012-08-19 ENCOUNTER — Emergency Department (HOSPITAL_COMMUNITY)
Admission: EM | Admit: 2012-08-19 | Discharge: 2012-08-19 | Disposition: A | Discharge status: Home / Self Care | Payer: PRIVATE HEALTH INSURANCE | Attending: Emergency Medicine | Admitting: Emergency Medicine

## 2012-08-19 DIAGNOSIS — D649 Anemia, unspecified: Secondary | ICD-10-CM | POA: Insufficient documentation

## 2012-08-19 DIAGNOSIS — F259 Schizoaffective disorder, unspecified: Secondary | ICD-10-CM | POA: Insufficient documentation

## 2012-08-19 DIAGNOSIS — I10 Essential (primary) hypertension: Secondary | ICD-10-CM | POA: Insufficient documentation

## 2012-08-19 DIAGNOSIS — J449 Chronic obstructive pulmonary disease, unspecified: Secondary | ICD-10-CM | POA: Insufficient documentation

## 2012-08-19 DIAGNOSIS — F419 Anxiety disorder, unspecified: Secondary | ICD-10-CM

## 2012-08-19 DIAGNOSIS — F411 Generalized anxiety disorder: Secondary | ICD-10-CM | POA: Insufficient documentation

## 2012-08-19 DIAGNOSIS — Z87891 Personal history of nicotine dependence: Secondary | ICD-10-CM | POA: Insufficient documentation

## 2012-08-19 DIAGNOSIS — J4489 Other specified chronic obstructive pulmonary disease: Secondary | ICD-10-CM | POA: Insufficient documentation

## 2012-08-19 NOTE — ED Provider Notes (Signed)
History     CSN: [id number]  Arrival date & time 08/19/12  2114   First MD Initiated Contact with Patient 08/19/12 2149      Chief Complaint  Patient presents with  . Anxiety    (Consider location/radiation/quality/duration/timing/severity/associated sxs/prior treatment) HPI Comments: Pt states he feels anxious and can't go to sleep.  States he is out of his nerve pills and shows me empty bottle of prednisone which he just completed for COPD  Patient is a 78 y.o. male presenting with anxiety. The history is provided by the patient. No language interpreter was used.  Anxiety This is a new problem. The current episode started today. The problem occurs constantly. Pertinent negatives include no chills, coughing or fever. Nothing aggravates the symptoms. He has tried nothing for the symptoms.    Past Medical History  Diagnosis Date  . COPD (chronic obstructive pulmonary disease)   . Anxiety   . Alcohol abuse   . Anemia   . Generalized headaches   . Hypertension   . 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 Pulmonary nodule/lesion, solitary   . Schizoaffective disorder   . GI bleed     History of recurrent bleeding that dates back as far as 2004 per records,    Past Surgical History  Procedure Date  . Colonoscopy Sept 2009    SLF: few small AVMs in ascending colon distal to IC . Ablated via BICAP.   Marland Kitchen Esophagogastroduodenoscopy Sept 2009    SLF: normal esophagus, small HH, 1-2 AVMs actively oozing in mid stomach, s/p BICAP, path benign   . Esophagogastroduodenoscopy 01/18/2012    Rourk-Erosive reflux esophagitis. Noncritical ring. Antral erosions/ small antral ulcer - appeared innocent  (H pylori serrology negative)  . Colonoscopy 03/09/2012    Procedure: COLONOSCOPY;  Surgeon: West Bali, MD;  Location: [facility];  Service: Endoscopy;  Laterality: N/A;  2:30  . Esophagogastroduodenoscopy 03/09/2012    Procedure:  ESOPHAGOGASTRODUODENOSCOPY (EGD);  Surgeon: West Bali, MD;  Location: [facility];  Service: Endoscopy;  Laterality: N/A;    Family History  Problem Relation Age of Onset  . Colon cancer Neg Hx     History  Substance Use Topics  . Smoking status: Former Smoker    Types: Cigarettes    Quit date: 05/07/1969  . Smokeless tobacco: Current User    Types: Snuff  . Alcohol Use: 2.4 oz/week    4 Cans of beer per week     ETOH-pt states quit 12/2011      Review of Systems  Constitutional: Negative for fever and chills.  Respiratory: Positive for shortness of breath. Negative for cough.   Psychiatric/Behavioral: The patient is nervous/anxious.   All other systems reviewed and are negative.    Allergies  Black pepper  Home Medications   Current Outpatient Rx  Name Route Sig Dispense Refill  . ALBUTEROL SULFATE HFA 108 (90 BASE) MCG/ACT IN AERS Inhalation Inhale 2 puffs into the lungs every 4 (four) hours as needed for wheezing. 1 Inhaler 3  . DIAZEPAM 5 MG PO TABS Oral Take 5 mg by mouth 2 (two) times daily.    Marland Kitchen HYDROCODONE-ACETAMINOPHEN 5-325 MG PO TABS Oral Take 1 tablet by mouth 4 (four) times daily as needed. Pain.    . IPRATROPIUM-ALBUTEROL 20-100 MCG/ACT IN AERS Inhalation Inhale 1 puff into the lungs every 6 (six) hours.    Marland Kitchen LEVALBUTEROL  TARTRATE 45 MCG/ACT IN AERO Inhalation Inhale 1-2 puffs into the lungs 2 (two) times daily. *Uses with an Aerochamber*    . PREDNISONE 10 MG PO TABS Oral Take 2 tablets (20 mg total) by mouth daily. 15 tablet 0    BP 136/82  Pulse 109  Temp 97.8 F (36.6 C) (Oral)  Resp 22  Wt 127 lb (57.607 kg)  SpO2 94%  Physical Exam  Nursing note and vitals reviewed. Constitutional: He is oriented to person, place, and time. He appears well-developed and well-nourished.  HENT:  Head: Normocephalic and atraumatic.  Eyes: EOM are normal.  Neck: Normal range of motion.  Cardiovascular: Normal rate, regular rhythm, normal heart sounds  and intact distal pulses.   Pulmonary/Chest: Effort normal. No respiratory distress. He has wheezes.  Abdominal: Soft. He exhibits no distension. There is no tenderness.  Musculoskeletal: Normal range of motion.  Neurological: He is alert and oriented to person, place, and time.  Skin: Skin is warm and dry.  Psychiatric: He has a normal mood and affect. Judgment normal.    ED Course  Procedures (including critical care time)  Labs Reviewed - No data to display No results found.   1. Anxiety       MDM  F/u with dr. Pecola Leisure, PA 08/19/12 2222  Evalina Field, PA 08/19/12 2222

## 2012-08-19 NOTE — ED Notes (Signed)
Patient c/o feeling "nervous".  Patient was seen here yesterday.

## 2012-08-20 ENCOUNTER — Encounter (HOSPITAL_COMMUNITY): Payer: Self-pay | Admitting: *Deleted

## 2012-08-20 ENCOUNTER — Emergency Department (HOSPITAL_COMMUNITY)
Admission: EM | Admit: 2012-08-20 | Discharge: 2012-08-20 | Disposition: A | Discharge status: Home / Self Care | Payer: PRIVATE HEALTH INSURANCE | Attending: Emergency Medicine | Admitting: Emergency Medicine

## 2012-08-20 DIAGNOSIS — R0600 Dyspnea, unspecified: Secondary | ICD-10-CM

## 2012-08-20 DIAGNOSIS — R51 Headache: Secondary | ICD-10-CM | POA: Insufficient documentation

## 2012-08-20 DIAGNOSIS — I10 Essential (primary) hypertension: Secondary | ICD-10-CM | POA: Insufficient documentation

## 2012-08-20 DIAGNOSIS — R0989 Other specified symptoms and signs involving the circulatory and respiratory systems: Secondary | ICD-10-CM | POA: Insufficient documentation

## 2012-08-20 DIAGNOSIS — J4489 Other specified chronic obstructive pulmonary disease: Secondary | ICD-10-CM | POA: Insufficient documentation

## 2012-08-20 DIAGNOSIS — D649 Anemia, unspecified: Secondary | ICD-10-CM | POA: Insufficient documentation

## 2012-08-20 DIAGNOSIS — K922 Gastrointestinal hemorrhage, unspecified: Secondary | ICD-10-CM | POA: Insufficient documentation

## 2012-08-20 DIAGNOSIS — F411 Generalized anxiety disorder: Secondary | ICD-10-CM | POA: Insufficient documentation

## 2012-08-20 DIAGNOSIS — Z8659 Personal history of other mental and behavioral disorders: Secondary | ICD-10-CM | POA: Insufficient documentation

## 2012-08-20 DIAGNOSIS — J449 Chronic obstructive pulmonary disease, unspecified: Secondary | ICD-10-CM | POA: Insufficient documentation

## 2012-08-20 DIAGNOSIS — R918 Other nonspecific abnormal finding of lung field: Secondary | ICD-10-CM | POA: Insufficient documentation

## 2012-08-20 DIAGNOSIS — R0609 Other forms of dyspnea: Secondary | ICD-10-CM | POA: Insufficient documentation

## 2012-08-20 DIAGNOSIS — Z87891 Personal history of nicotine dependence: Secondary | ICD-10-CM | POA: Insufficient documentation

## 2012-08-20 MED ORDER — ALBUTEROL SULFATE HFA 108 (90 BASE) MCG/ACT IN AERS
2.0000 | INHALATION_SPRAY | RESPIRATORY_TRACT | Status: DC | PRN
  Administered 2012-08-20: 2 via RESPIRATORY_TRACT
  Filled 2012-08-20: qty 6.7

## 2012-08-20 MED ORDER — ALBUTEROL SULFATE (5 MG/ML) 0.5% IN NEBU
5.0000 mg | INHALATION_SOLUTION | Freq: Once | RESPIRATORY_TRACT | Status: AC
  Administered 2012-08-20: 5 mg via RESPIRATORY_TRACT
  Filled 2012-08-20: qty 0.5

## 2012-08-20 MED ORDER — IPRATROPIUM BROMIDE 0.02 % IN SOLN
0.5000 mg | Freq: Once | RESPIRATORY_TRACT | Status: AC
  Administered 2012-08-20: 0.5 mg via RESPIRATORY_TRACT
  Filled 2012-08-20: qty 2.5

## 2012-08-20 NOTE — ED Provider Notes (Signed)
Medical screening examination/treatment/procedure(s) were performed by non-physician practitioner and as supervising physician I was immediately available for consultation/collaboration.   Burl Tauzin III, MD 08/20/12 1230 

## 2012-08-20 NOTE — ED Notes (Signed)
Patient with no complaints at this time. Respirations even and unlabored. Skin warm/dry. Discharge instructions reviewed with patient at this time. Patient given opportunity to voice concerns/ask questions. Patient discharged at this time and left Emergency Department with steady gait.   

## 2012-08-20 NOTE — ED Provider Notes (Signed)
History    This chart was scribed for Geoffery Lyons, MD, MD by Smitty Pluck. The patient was seen in room [facility] and the patient's care was started at 3:52PM.   CSN: [id number]  Arrival date & time 08/20/12  1525      Chief Complaint  Patient presents with  . Shortness of Breath    (Consider location/radiation/quality/duration/timing/severity/associated sxs/prior treatment) Patient is a 78 y.o. male presenting with shortness of breath. The history is provided by the patient. No language interpreter was used.  Shortness of Breath  Associated symptoms include shortness of breath.   [name] is a 78 y.o. male who presents to the Emergency Department complaining of constant, moderate SOB. Pt frequently comes to ED for same symptoms. Reports that he has used inhaler without relief. Pt reports that he has been eating chamomile root for his cough. Pt reports going to Dr. Juanetta Gosling today. He states that he is constipated.   Past Medical History  Diagnosis Date  . COPD (chronic obstructive pulmonary disease)   . Anxiety   . Alcohol abuse   . Anemia   . Generalized headaches   . Hypertension   . 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 Pulmonary nodule/lesion, solitary   . Schizoaffective disorder   . GI bleed     History of recurrent bleeding that dates back as far as 2004 per records,    Past Surgical History  Procedure Date  . Colonoscopy Sept 2009    SLF: few small AVMs in ascending colon distal to IC . Ablated via BICAP.   Marland Kitchen Esophagogastroduodenoscopy Sept 2009    SLF: normal esophagus, small HH, 1-2 AVMs actively oozing in mid stomach, s/p BICAP, path benign   . Esophagogastroduodenoscopy 01/18/2012    Rourk-Erosive reflux esophagitis. Noncritical ring. Antral erosions/ small antral ulcer - appeared innocent  (H pylori serrology negative)  . Colonoscopy 03/09/2012    Procedure: COLONOSCOPY;  Surgeon: West Bali, MD;   Location: [facility];  Service: Endoscopy;  Laterality: N/A;  2:30  . Esophagogastroduodenoscopy 03/09/2012    Procedure: ESOPHAGOGASTRODUODENOSCOPY (EGD);  Surgeon: West Bali, MD;  Location: [facility];  Service: Endoscopy;  Laterality: N/A;    Family History  Problem Relation Age of Onset  . Colon cancer Neg Hx     History  Substance Use Topics  . Smoking status: Former Smoker    Types: Cigarettes    Quit date: 05/07/1969  . Smokeless tobacco: Current User    Types: Snuff  . Alcohol Use: 2.4 oz/week    4 Cans of beer per week     ETOH-pt states quit 12/2011      Review of Systems  Respiratory: Positive for shortness of breath.   All other systems reviewed and are negative.    Allergies  Black pepper  Home Medications   Current Outpatient Rx  Name Route Sig Dispense Refill  . ALBUTEROL SULFATE HFA 108 (90 BASE) MCG/ACT IN AERS Inhalation Inhale 2 puffs into the lungs every 4 (four) hours as needed for wheezing. 1 Inhaler 3  . DIAZEPAM 5 MG PO TABS Oral Take 5 mg by mouth 2 (two) times daily.    Marland Kitchen HYDROCODONE-ACETAMINOPHEN 5-325 MG PO TABS Oral Take 1 tablet by mouth 4 (four) times daily as needed. Pain.    . IPRATROPIUM-ALBUTEROL 20-100 MCG/ACT IN AERS Inhalation Inhale 1 puff into the lungs every 6 (six)  hours.    Marland Kitchen LEVALBUTEROL TARTRATE 45 MCG/ACT IN AERO Inhalation Inhale 1-2 puffs into the lungs 2 (two) times daily. *Uses with an Aerochamber*    . PREDNISONE 10 MG PO TABS Oral Take 2 tablets (20 mg total) by mouth daily. 15 tablet 0    BP 136/71  Pulse 108  Temp 98.5 F (36.9 C) (Oral)  Resp 22  Ht 5\' 4"  (1.626 m)  Wt 124 lb 2 oz (56.303 kg)  BMI 21.31 kg/m2  SpO2 97%  Physical Exam  Nursing note and vitals reviewed. Constitutional: He is oriented to person, place, and time. He appears well-developed and well-nourished. No distress.  HENT:  Head: Normocephalic and atraumatic.  Eyes: EOM are normal. Pupils are equal, round, and reactive to  light.  Neck: Normal range of motion. Neck supple. No tracheal deviation present.  Cardiovascular: Normal rate, regular rhythm and normal heart sounds.   Pulmonary/Chest: Effort normal. No respiratory distress. He has wheezes (bilateral expiratory ). He has no rales.  Abdominal: Soft. He exhibits no distension. There is no tenderness. There is no rebound.  Musculoskeletal: Normal range of motion.  Neurological: He is alert and oriented to person, place, and time.  Skin: Skin is warm and dry.  Psychiatric: He has a normal mood and affect. His behavior is normal.    ED Course  Procedures (including critical care time) DIAGNOSTIC STUDIES: Oxygen Saturation is 97% on [place], normal by my interpretation.    COORDINATION OF CARE: 3:59 PM Discussed ED treatment with pt  4:00 PM Ordered:    . albuterol  5 mg Nebulization Once  . ipratropium  0.5 mg Nebulization Once       Labs Reviewed - No data to display No results found.   No diagnosis found.    MDM  Patient presents here for the third time is as many days.  He frequently comes here complaining of shortness of breath.  He never seems to be hypoxic and his exam and workup are consistently unchanged.  He wants another inhaler and a breathing treatment.  I have obliged these requests.  As per his norm, he received his treatment and became extremely impatient, wanting to be discharged right away.    He appears at baseline and no further workup seems necessary at this point.   I personally performed the services described in this documentation, which was scribed in my presence. The recorded information has been reviewed and considered.         Geoffery Lyons, MD 08/21/12 [phone]

## 2012-08-20 NOTE — ED Notes (Signed)
Sob today,cough,

## 2012-08-20 NOTE — ED Notes (Signed)
Pt in BR when called 

## 2012-08-21 ENCOUNTER — Emergency Department (HOSPITAL_COMMUNITY)
Admission: EM | Admit: 2012-08-21 | Discharge: 2012-08-21 | Disposition: A | Discharge status: Home / Self Care | Payer: PRIVATE HEALTH INSURANCE | Attending: Emergency Medicine | Admitting: Emergency Medicine

## 2012-08-21 ENCOUNTER — Encounter (HOSPITAL_COMMUNITY): Payer: Self-pay

## 2012-08-21 ENCOUNTER — Encounter (HOSPITAL_COMMUNITY): Payer: Self-pay | Admitting: Emergency Medicine

## 2012-08-21 DIAGNOSIS — Z8659 Personal history of other mental and behavioral disorders: Secondary | ICD-10-CM | POA: Insufficient documentation

## 2012-08-21 DIAGNOSIS — R0600 Dyspnea, unspecified: Secondary | ICD-10-CM

## 2012-08-21 DIAGNOSIS — J4489 Other specified chronic obstructive pulmonary disease: Secondary | ICD-10-CM | POA: Insufficient documentation

## 2012-08-21 DIAGNOSIS — F411 Generalized anxiety disorder: Secondary | ICD-10-CM | POA: Insufficient documentation

## 2012-08-21 DIAGNOSIS — IMO0002 Reserved for concepts with insufficient information to code with codable children: Secondary | ICD-10-CM | POA: Insufficient documentation

## 2012-08-21 DIAGNOSIS — Z862 Personal history of diseases of the blood and blood-forming organs and certain disorders involving the immune mechanism: Secondary | ICD-10-CM | POA: Insufficient documentation

## 2012-08-21 DIAGNOSIS — Z8709 Personal history of other diseases of the respiratory system: Secondary | ICD-10-CM | POA: Insufficient documentation

## 2012-08-21 DIAGNOSIS — R0989 Other specified symptoms and signs involving the circulatory and respiratory systems: Secondary | ICD-10-CM | POA: Insufficient documentation

## 2012-08-21 DIAGNOSIS — J449 Chronic obstructive pulmonary disease, unspecified: Secondary | ICD-10-CM | POA: Insufficient documentation

## 2012-08-21 DIAGNOSIS — F419 Anxiety disorder, unspecified: Secondary | ICD-10-CM

## 2012-08-21 DIAGNOSIS — I10 Essential (primary) hypertension: Secondary | ICD-10-CM | POA: Insufficient documentation

## 2012-08-21 DIAGNOSIS — Z87891 Personal history of nicotine dependence: Secondary | ICD-10-CM | POA: Insufficient documentation

## 2012-08-21 DIAGNOSIS — Z79899 Other long term (current) drug therapy: Secondary | ICD-10-CM | POA: Insufficient documentation

## 2012-08-21 DIAGNOSIS — F259 Schizoaffective disorder, unspecified: Secondary | ICD-10-CM | POA: Insufficient documentation

## 2012-08-21 DIAGNOSIS — F1021 Alcohol dependence, in remission: Secondary | ICD-10-CM | POA: Insufficient documentation

## 2012-08-21 DIAGNOSIS — F1010 Alcohol abuse, uncomplicated: Secondary | ICD-10-CM | POA: Insufficient documentation

## 2012-08-21 DIAGNOSIS — R0609 Other forms of dyspnea: Secondary | ICD-10-CM | POA: Insufficient documentation

## 2012-08-21 DIAGNOSIS — Z8719 Personal history of other diseases of the digestive system: Secondary | ICD-10-CM | POA: Insufficient documentation

## 2012-08-21 MED ORDER — ALBUTEROL SULFATE (5 MG/ML) 0.5% IN NEBU
5.0000 mg | INHALATION_SOLUTION | Freq: Once | RESPIRATORY_TRACT | Status: AC
  Administered 2012-08-21: 5 mg via RESPIRATORY_TRACT
  Filled 2012-08-21: qty 1

## 2012-08-21 MED ORDER — PREDNISONE 20 MG PO TABS: 40.0000 mg | ORAL_TABLET | Freq: Every day | ORAL | Status: DC

## 2012-08-21 MED ORDER — IPRATROPIUM BROMIDE 0.02 % IN SOLN
0.5000 mg | Freq: Once | RESPIRATORY_TRACT | Status: AC
  Administered 2012-08-21: 0.5 mg via RESPIRATORY_TRACT
  Filled 2012-08-21: qty 2.5

## 2012-08-21 NOTE — ED Notes (Signed)
Difficulty breathing per pt. Started today, only used inhaler twice today.

## 2012-08-21 NOTE — ED Provider Notes (Signed)
History     CSN: [id number]  Arrival date & time 08/21/12  0030   First MD Initiated Contact with Patient 08/21/12 [phone]      Chief Complaint  Patient presents with  . Shortness of Breath    (Consider location/radiation/quality/duration/timing/severity/associated sxs/prior treatment) HPI Comments: The patient presents with chronic shortness of breath, he has COPD, uses his albuterol and area sparingly, no coughing or fevers, states that he went home and had mild increased wheezing so he used a chest rub solution to help with opening of the sinuses, this helped, he has been taking milk of magnesia for constipation but has no complaints of pain, vomiting, fevers.  Patient is a 78 y.o. male presenting with shortness of breath. The history is provided by the patient and medical records.  Shortness of Breath  Associated symptoms include shortness of breath and wheezing. Pertinent negatives include no fever and no cough.    Past Medical History  Diagnosis Date  . COPD (chronic obstructive pulmonary disease)   . Anxiety   . Alcohol abuse   . Anemia   . Generalized headaches   . Hypertension   . 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 Pulmonary nodule/lesion, solitary   . Schizoaffective disorder   . GI bleed     History of recurrent bleeding that dates back as far as 2004 per records,    Past Surgical History  Procedure Date  . Colonoscopy Sept 2009    SLF: few small AVMs in ascending colon distal to IC . Ablated via BICAP.   Marland Kitchen Esophagogastroduodenoscopy Sept 2009    SLF: normal esophagus, small HH, 1-2 AVMs actively oozing in mid stomach, s/p BICAP, path benign   . Esophagogastroduodenoscopy 01/18/2012    Rourk-Erosive reflux esophagitis. Noncritical ring. Antral erosions/ small antral ulcer - appeared innocent  (H pylori serrology negative)  . Colonoscopy 03/09/2012    Procedure: COLONOSCOPY;  Surgeon: West Bali, MD;   Location: [facility];  Service: Endoscopy;  Laterality: N/A;  2:30  . Esophagogastroduodenoscopy 03/09/2012    Procedure: ESOPHAGOGASTRODUODENOSCOPY (EGD);  Surgeon: West Bali, MD;  Location: [facility];  Service: Endoscopy;  Laterality: N/A;    Family History  Problem Relation Age of Onset  . Colon cancer Neg Hx     History  Substance Use Topics  . Smoking status: Former Smoker    Types: Cigarettes    Quit date: 05/07/1969  . Smokeless tobacco: Current User    Types: Snuff  . Alcohol Use: 2.4 oz/week    4 Cans of beer per week     ETOH-pt states quit 12/2011      Review of Systems  Constitutional: Negative for fever.  Respiratory: Positive for shortness of breath and wheezing. Negative for cough.     Allergies  Black pepper  Home Medications   Current Outpatient Rx  Name Route Sig Dispense Refill  . ALBUTEROL SULFATE HFA 108 (90 BASE) MCG/ACT IN AERS Inhalation Inhale 2 puffs into the lungs every 4 (four) hours as needed for wheezing. 1 Inhaler 3  . DIAZEPAM 5 MG PO TABS Oral Take 5 mg by mouth 2 (two) times daily.    Marland Kitchen HYDROCODONE-ACETAMINOPHEN 5-325 MG PO TABS Oral Take 1 tablet by mouth 4 (four) times daily as needed. Pain.    . IPRATROPIUM-ALBUTEROL 20-100 MCG/ACT IN AERS Inhalation Inhale 1 puff into the lungs every 6 (six) hours.    Marland Kitchen  LEVALBUTEROL TARTRATE 45 MCG/ACT IN AERO Inhalation Inhale 1-2 puffs into the lungs 2 (two) times daily. *Uses with an Aerochamber*    . PREDNISONE 10 MG PO TABS Oral Take 2 tablets (20 mg total) by mouth daily. 15 tablet 0  . PREDNISONE 20 MG PO TABS Oral Take 2 tablets (40 mg total) by mouth daily. 10 tablet 0    BP 102/59  Pulse 88  Temp 98 F (36.7 C) (Oral)  Resp 18  Wt 124 lb (56.246 kg)  SpO2 97%  Physical Exam  Nursing note and vitals reviewed. Constitutional: He appears well-developed and well-nourished. No distress.  HENT:  Head: Normocephalic and atraumatic.  Mouth/Throat: Oropharynx is clear and  moist. No oropharyngeal exudate.  Eyes: Conjunctivae normal and EOM are normal. Pupils are equal, round, and reactive to light. Right eye exhibits no discharge. Left eye exhibits no discharge. No scleral icterus.  Neck: Normal range of motion. Neck supple. No JVD present. No thyromegaly present.  Cardiovascular: Normal rate, regular rhythm, normal heart sounds and intact distal pulses.  Exam reveals no gallop and no friction rub.   No murmur heard. Pulmonary/Chest: Effort normal. No respiratory distress. He has wheezes (mild end expiratory wheezing, no increased work of breathing, speaks in full sentences). He has no rales.  Abdominal: Soft. Bowel sounds are normal. He exhibits no distension and no mass. There is no tenderness.  Musculoskeletal: Normal range of motion. He exhibits no edema and no tenderness.  Lymphadenopathy:    He has no cervical adenopathy.  Neurological: He is alert. Coordination normal.  Skin: Skin is warm and dry. No rash noted. No erythema.  Psychiatric: He has a normal mood and affect. His behavior is normal.    ED Course  Procedures (including critical care time)  Labs Reviewed - No data to display No results found.   1. COPD (chronic obstructive pulmonary disease)       MDM  The patient states that he wants to be on home oxygen constantly, his oxygen level for me is 94% on room air, he is mildly Wheezing, he has no tachycardia no fever and no hypotension. He has been seen frequently for the same complaint, he comes almost daily and always has the same her reactive airway disease.  There is no significant increased work of breathing, there is no chest pain, there is no palpitations and he is not appear to be in distress.  The patient has been stable since arrival, he has been sleeping without difficulty, oxygen saturations between 90-96%. Prednisone for home, he has an albuterol MDI with him which still has medication in it, he can followup with his family  Dr.      Vida Roller, MD 08/21/12 [phone]

## 2012-08-21 NOTE — ED Notes (Signed)
Patient wanted to have lights turned off and head of stretcher let down so he can go to sleep.

## 2012-08-21 NOTE — ED Notes (Signed)
Patient presents to ER with c/o shortness of breath.  Patient states he bought milk of magnesia to help him breathe.

## 2012-08-21 NOTE — ED Notes (Signed)
Patient has been asleep. He is concerned if he is getting enough oxygen. Patient is ready for discharge.

## 2012-08-21 NOTE — ED Provider Notes (Signed)
History    78-year-old male with shortness of breath. Chronic in nature. States that this is worse today. Patient states he was evaluated by his PCP earlier this afternoon. He says he was started on prednisone. Patient does have his prescription with him. No fevers or chills. No chest pain. No unusual leg pain or swelling.   CSN: [id number]  Arrival date & time 08/21/12  1907   First MD Initiated Contact with Patient 08/21/12 1940      Chief Complaint  Patient presents with  . Shortness of Breath    (Consider location/radiation/quality/duration/timing/severity/associated sxs/prior treatment) HPI  Past Medical History  Diagnosis Date  . COPD (chronic obstructive pulmonary disease)   . Anxiety   . Alcohol abuse   . Anemia   . Generalized headaches   . Hypertension   . 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 Pulmonary nodule/lesion, solitary   . Schizoaffective disorder   . GI bleed     History of recurrent bleeding that dates back as far as 2004 per records,    Past Surgical History  Procedure Date  . Colonoscopy Sept 2009    SLF: few small AVMs in ascending colon distal to IC . Ablated via BICAP.   Marland Kitchen Esophagogastroduodenoscopy Sept 2009    SLF: normal esophagus, small HH, 1-2 AVMs actively oozing in mid stomach, s/p BICAP, path benign   . Esophagogastroduodenoscopy 01/18/2012    Rourk-Erosive reflux esophagitis. Noncritical ring. Antral erosions/ small antral ulcer - appeared innocent  (H pylori serrology negative)  . Colonoscopy 03/09/2012    Procedure: COLONOSCOPY;  Surgeon: West Bali, MD;  Location: [facility];  Service: Endoscopy;  Laterality: N/A;  2:30  . Esophagogastroduodenoscopy 03/09/2012    Procedure: ESOPHAGOGASTRODUODENOSCOPY (EGD);  Surgeon: West Bali, MD;  Location: [facility];  Service: Endoscopy;  Laterality: N/A;    Family History  Problem Relation Age of Onset  . Colon cancer Neg Hx      History  Substance Use Topics  . Smoking status: Former Smoker    Types: Cigarettes    Quit date: 05/07/1969  . Smokeless tobacco: Current User    Types: Snuff  . Alcohol Use: 2.4 oz/week    4 Cans of beer per week     ETOH-pt states quit 12/2011      Review of Systems   Review of symptoms negative unless otherwise noted in HPI.   Allergies  Black pepper  Home Medications   Current Outpatient Rx  Name Route Sig Dispense Refill  . ALBUTEROL SULFATE HFA 108 (90 BASE) MCG/ACT IN AERS Inhalation Inhale 2 puffs into the lungs every 4 (four) hours as needed for wheezing. 1 Inhaler 3  . DIAZEPAM 5 MG PO TABS Oral Take 5 mg by mouth 2 (two) times daily.    Marland Kitchen HYDROCODONE-ACETAMINOPHEN 5-325 MG PO TABS Oral Take 1 tablet by mouth 4 (four) times daily as needed. Pain.    . IPRATROPIUM-ALBUTEROL 20-100 MCG/ACT IN AERS Inhalation Inhale 1 puff into the lungs every 6 (six) hours.    Marland Kitchen LEVALBUTEROL TARTRATE 45 MCG/ACT IN AERO Inhalation Inhale 1-2 puffs into the lungs 2 (two) times daily. *Uses with an Aerochamber*    . PREDNISONE 10 MG PO TABS Oral Take 2 tablets (20 mg total) by mouth daily. 15 tablet 0  . PREDNISONE 20 MG PO TABS Oral Take 2 tablets (40 mg total) by mouth daily.  10 tablet 0    BP 129/73  Pulse 105  Temp 98.1 F (36.7 C) (Oral)  Resp 24  Ht 5\' 7"  (1.702 m)  Wt 125 lb 7 oz (56.898 kg)  BMI 19.65 kg/m2  SpO2 95%  Physical Exam  Nursing note and vitals reviewed. Constitutional: He appears well-developed and well-nourished. No distress.  HENT:  Head: Normocephalic and atraumatic.  Eyes: Conjunctivae normal are normal. Right eye exhibits no discharge. Left eye exhibits no discharge.  Neck: Neck supple.  Cardiovascular: Normal rate, regular rhythm and normal heart sounds.  Exam reveals no gallop and no friction rub.   No murmur heard. Pulmonary/Chest: Effort normal. No respiratory distress. He has wheezes.       Mild expiratory wheezing bilaterally.    Abdominal: Soft. He exhibits no distension. There is no tenderness.  Musculoskeletal: He exhibits no edema and no tenderness.  Neurological: He is alert.  Skin: Skin is warm and dry.  Psychiatric: Thought content normal.       Ornery     ED Course  Procedures (including critical care time)  Labs Reviewed - No data to display No results found.   1. Dyspnea   2. Anxiety       MDM  78 year old male with dyspnea. Suspect a very strong anxiety component. This is patient's 15th ER visit this month and his fourth in the past 3 days. He also saw his PCP today as well. He does have some mild wheezing bilaterally but is in no respiratory distress. Oxygen saturations are good. I feel is safe for discharge.        Raeford Razor, MD 08/21/12 [phone]

## 2012-08-21 NOTE — ED Notes (Signed)
Discharge instructions reviewed with pt, questions answered. Pt verbalized understanding.  

## 2012-08-24 ENCOUNTER — Emergency Department (HOSPITAL_COMMUNITY)
Admission: EM | Admit: 2012-08-24 | Discharge: 2012-08-24 | Disposition: A | Discharge status: Home / Self Care | Payer: PRIVATE HEALTH INSURANCE | Attending: Emergency Medicine | Admitting: Emergency Medicine

## 2012-08-24 ENCOUNTER — Encounter (HOSPITAL_COMMUNITY): Payer: Self-pay | Admitting: *Deleted

## 2012-08-24 DIAGNOSIS — J4489 Other specified chronic obstructive pulmonary disease: Secondary | ICD-10-CM | POA: Insufficient documentation

## 2012-08-24 DIAGNOSIS — R062 Wheezing: Secondary | ICD-10-CM | POA: Insufficient documentation

## 2012-08-24 DIAGNOSIS — IMO0002 Reserved for concepts with insufficient information to code with codable children: Secondary | ICD-10-CM | POA: Insufficient documentation

## 2012-08-24 DIAGNOSIS — Z8719 Personal history of other diseases of the digestive system: Secondary | ICD-10-CM | POA: Insufficient documentation

## 2012-08-24 DIAGNOSIS — Z862 Personal history of diseases of the blood and blood-forming organs and certain disorders involving the immune mechanism: Secondary | ICD-10-CM | POA: Insufficient documentation

## 2012-08-24 DIAGNOSIS — Z9889 Other specified postprocedural states: Secondary | ICD-10-CM | POA: Insufficient documentation

## 2012-08-24 DIAGNOSIS — Z79899 Other long term (current) drug therapy: Secondary | ICD-10-CM | POA: Insufficient documentation

## 2012-08-24 DIAGNOSIS — J984 Other disorders of lung: Secondary | ICD-10-CM | POA: Insufficient documentation

## 2012-08-24 DIAGNOSIS — Z8679 Personal history of other diseases of the circulatory system: Secondary | ICD-10-CM | POA: Insufficient documentation

## 2012-08-24 DIAGNOSIS — F17200 Nicotine dependence, unspecified, uncomplicated: Secondary | ICD-10-CM | POA: Insufficient documentation

## 2012-08-24 DIAGNOSIS — F411 Generalized anxiety disorder: Secondary | ICD-10-CM | POA: Insufficient documentation

## 2012-08-24 DIAGNOSIS — I10 Essential (primary) hypertension: Secondary | ICD-10-CM | POA: Insufficient documentation

## 2012-08-24 DIAGNOSIS — J449 Chronic obstructive pulmonary disease, unspecified: Secondary | ICD-10-CM

## 2012-08-24 DIAGNOSIS — F259 Schizoaffective disorder, unspecified: Secondary | ICD-10-CM | POA: Insufficient documentation

## 2012-08-24 MED ORDER — ALBUTEROL SULFATE HFA 108 (90 BASE) MCG/ACT IN AERS
2.0000 | INHALATION_SPRAY | RESPIRATORY_TRACT | Status: DC | PRN
  Filled 2012-08-24: qty 6.7

## 2012-08-24 MED ORDER — LEVALBUTEROL HCL 0.63 MG/3ML IN NEBU
0.6300 mg | INHALATION_SOLUTION | Freq: Once | RESPIRATORY_TRACT | Status: AC
  Administered 2012-08-24: 0.63 mg via RESPIRATORY_TRACT
  Filled 2012-08-24: qty 3

## 2012-08-24 NOTE — ED Provider Notes (Signed)
History   This chart was scribed for [name] Lennert, MD by [name]. The patient was seen in room [facility]15/[facility]15. Patient's care was started at 2044.  CSN: [id number]  Arrival date & time 08/24/12  2044   First MD Initiated Contact with Patient 08/24/12 2112      Chief Complaint  Patient presents with  . Shortness of Breath   Patient is a 78 y.o. male presenting with shortness of breath. The history is provided by the patient. No language interpreter was used.  Shortness of Breath  The current episode started today. The problem occurs occasionally. The problem has been unchanged. The problem is mild. Associated symptoms include shortness of breath and wheezing. There was no intake of a foreign body. The Heimlich maneuver was not attempted. He has had intermittent steroid use. Urine output has been normal. There were no sick contacts. Recently, medical care has been given at this facility. Services received include medications given, tests performed and one or more referrals.    [name] is a 78 y.o. male who presents to the Emergency Department complaining of constant, moderate SOB with associate wheezing. Pt frequently comes to Emergency Department for same symptoms. He denotes using an inhaler without relief. Symptoms have not been treated PTA. No fever, chills, cough, congestion, rhinorrhea, chest pain, or n/v/d.    Past Medical History  Diagnosis Date  . COPD (chronic obstructive pulmonary disease)   . Anxiety   . Alcohol abuse   . Anemia   . Generalized headaches   . Hypertension   . 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 Pulmonary nodule/lesion, solitary   . Schizoaffective disorder   . GI bleed     History of recurrent bleeding that dates back as far as 2004 per records,    Past Surgical History  Procedure Date  . Colonoscopy Sept 2009    SLF: few small AVMs in ascending colon distal to IC . Ablated via  BICAP.   Marland Kitchen Esophagogastroduodenoscopy Sept 2009    SLF: normal esophagus, small HH, 1-2 AVMs actively oozing in mid stomach, s/p BICAP, path benign   . Esophagogastroduodenoscopy 01/18/2012    Rourk-Erosive reflux esophagitis. Noncritical ring. Antral erosions/ small antral ulcer - appeared innocent  (H pylori serrology negative)  . Colonoscopy 03/09/2012    Procedure: COLONOSCOPY;  Surgeon: West Bali, MD;  Location: [facility];  Service: Endoscopy;  Laterality: N/A;  2:30  . Esophagogastroduodenoscopy 03/09/2012    Procedure: ESOPHAGOGASTRODUODENOSCOPY (EGD);  Surgeon: West Bali, MD;  Location: [facility];  Service: Endoscopy;  Laterality: N/A;    Family History  Problem Relation Age of Onset  . Colon cancer Neg Hx     History  Substance Use Topics  . Smoking status: Former Smoker    Types: Cigarettes    Quit date: 05/07/1969  . Smokeless tobacco: Current User    Types: Snuff  . Alcohol Use: 2.4 oz/week    4 Cans of beer per week     ETOH-pt states quit 12/2011      Review of Systems  Respiratory: Positive for shortness of breath and wheezing.   All other systems reviewed and are negative.    Allergies  Black pepper  Home Medications   Current Outpatient Rx  Name Route Sig Dispense Refill  . ALBUTEROL SULFATE HFA 108 (90 BASE) MCG/ACT IN AERS Inhalation Inhale 2 puffs into the  lungs every 4 (four) hours as needed for wheezing. 1 Inhaler 3  . DIAZEPAM 5 MG PO TABS Oral Take 5 mg by mouth 2 (two) times daily.    Marland Kitchen HYDROCODONE-ACETAMINOPHEN 5-325 MG PO TABS Oral Take 1 tablet by mouth 4 (four) times daily as needed. Pain.    . IPRATROPIUM-ALBUTEROL 20-100 MCG/ACT IN AERS Inhalation Inhale 1 puff into the lungs every 6 (six) hours.    Marland Kitchen LEVALBUTEROL TARTRATE 45 MCG/ACT IN AERO Inhalation Inhale 1-2 puffs into the lungs 2 (two) times daily. *Uses with an Aerochamber*    . PREDNISONE 10 MG PO TABS Oral Take 2 tablets (20 mg total) by mouth daily. 15 tablet 0  .  PREDNISONE 20 MG PO TABS Oral Take 2 tablets (40 mg total) by mouth daily. 10 tablet 0    BP 145/75  Pulse 90  Temp 98.7 F (37.1 C) (Oral)  Resp 25  Ht 5\' 7"  (1.702 m)  Wt 125 lb (56.7 kg)  BMI 19.58 kg/m2  SpO2 94%  Physical Exam  Constitutional: He is oriented to person, place, and time. He appears well-developed.  HENT:  Head: Normocephalic and atraumatic.  Eyes: Conjunctivae normal and EOM are normal. No scleral icterus.  Neck: Neck supple. No thyromegaly present.  Cardiovascular: Normal rate and regular rhythm.  Exam reveals no gallop and no friction rub.   No murmur heard. Pulmonary/Chest: No stridor. No respiratory distress. He has no wheezes. He has no rales. He exhibits no tenderness.       Upper airway wheezing.  Abdominal: He exhibits no distension. There is no tenderness. There is no rebound.  Musculoskeletal: Normal range of motion. He exhibits no edema.  Lymphadenopathy:    He has no cervical adenopathy.  Neurological: He is oriented to person, place, and time. Coordination normal.  Skin: No rash noted. No erythema.  Psychiatric: He has a normal mood and affect. His behavior is normal.    ED Course  Procedures DIAGNOSTIC STUDIES:   COORDINATION OF CARE:     Labs Reviewed - No data to display No results found.   No diagnosis found.  Pt improved with tx  MDM          [name] Lennert, MD 08/24/12 2250

## 2012-08-24 NOTE — ED Notes (Signed)
Pt states feeling SOB.

## 2012-08-24 NOTE — ED Notes (Signed)
Pt reports inhaler is "giving out".

## 2012-08-26 ENCOUNTER — Encounter (HOSPITAL_COMMUNITY): Payer: Self-pay | Admitting: *Deleted

## 2012-08-26 ENCOUNTER — Emergency Department (HOSPITAL_COMMUNITY)
Admission: EM | Admit: 2012-08-26 | Discharge: 2012-08-26 | Disposition: A | Discharge status: Home / Self Care | Payer: PRIVATE HEALTH INSURANCE | Attending: Emergency Medicine | Admitting: Emergency Medicine

## 2012-08-26 ENCOUNTER — Emergency Department (HOSPITAL_COMMUNITY): Payer: PRIVATE HEALTH INSURANCE

## 2012-08-26 DIAGNOSIS — F259 Schizoaffective disorder, unspecified: Secondary | ICD-10-CM | POA: Insufficient documentation

## 2012-08-26 DIAGNOSIS — Z87891 Personal history of nicotine dependence: Secondary | ICD-10-CM | POA: Insufficient documentation

## 2012-08-26 DIAGNOSIS — I10 Essential (primary) hypertension: Secondary | ICD-10-CM | POA: Insufficient documentation

## 2012-08-26 DIAGNOSIS — J4489 Other specified chronic obstructive pulmonary disease: Secondary | ICD-10-CM | POA: Insufficient documentation

## 2012-08-26 DIAGNOSIS — J449 Chronic obstructive pulmonary disease, unspecified: Secondary | ICD-10-CM

## 2012-08-26 DIAGNOSIS — Z79899 Other long term (current) drug therapy: Secondary | ICD-10-CM | POA: Insufficient documentation

## 2012-08-26 DIAGNOSIS — Z8709 Personal history of other diseases of the respiratory system: Secondary | ICD-10-CM | POA: Insufficient documentation

## 2012-08-26 DIAGNOSIS — R51 Headache: Secondary | ICD-10-CM | POA: Insufficient documentation

## 2012-08-26 DIAGNOSIS — Z8719 Personal history of other diseases of the digestive system: Secondary | ICD-10-CM | POA: Insufficient documentation

## 2012-08-26 DIAGNOSIS — Z862 Personal history of diseases of the blood and blood-forming organs and certain disorders involving the immune mechanism: Secondary | ICD-10-CM | POA: Insufficient documentation

## 2012-08-26 DIAGNOSIS — F411 Generalized anxiety disorder: Secondary | ICD-10-CM | POA: Insufficient documentation

## 2012-08-26 MED ORDER — ACETAMINOPHEN 325 MG PO TABS
650.0000 mg | ORAL_TABLET | Freq: Once | ORAL | Status: AC
  Administered 2012-08-26: 650 mg via ORAL
  Filled 2012-08-26: qty 2

## 2012-08-26 NOTE — ED Notes (Signed)
Pt discharged. Pt stable at time of discharge. pt has no questions regarding discharge at this time. Pt voiced understanding of discharge instructions.  

## 2012-08-26 NOTE — ED Provider Notes (Cosign Needed)
History     CSN: [id number]  Arrival date & time 08/26/12  7829   First MD Initiated Contact with Patient 08/26/12 2157      Chief Complaint  Patient presents with  . Shortness of Breath    (Consider location/radiation/quality/duration/timing/severity/associated sxs/prior treatment) HPI  Past Medical History  Diagnosis Date  . COPD (chronic obstructive pulmonary disease)   . Anxiety   . Alcohol abuse   . Anemia   . Generalized headaches   . Hypertension   . 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 Pulmonary nodule/lesion, solitary   . Schizoaffective disorder   . GI bleed     History of recurrent bleeding that dates back as far as 2004 per records,    Past Surgical History  Procedure Date  . Colonoscopy Sept 2009    SLF: few small AVMs in ascending colon distal to IC . Ablated via BICAP.   Marland Kitchen Esophagogastroduodenoscopy Sept 2009    SLF: normal esophagus, small HH, 1-2 AVMs actively oozing in mid stomach, s/p BICAP, path benign   . Esophagogastroduodenoscopy 01/18/2012    Rourk-Erosive reflux esophagitis. Noncritical ring. Antral erosions/ small antral ulcer - appeared innocent  (H pylori serrology negative)  . Colonoscopy 03/09/2012    Procedure: COLONOSCOPY;  Surgeon: West Bali, MD;  Location: [facility];  Service: Endoscopy;  Laterality: N/A;  2:30  . Esophagogastroduodenoscopy 03/09/2012    Procedure: ESOPHAGOGASTRODUODENOSCOPY (EGD);  Surgeon: West Bali, MD;  Location: [facility];  Service: Endoscopy;  Laterality: N/A;    Family History  Problem Relation Age of Onset  . Colon cancer Neg Hx     History  Substance Use Topics  . Smoking status: Former Smoker    Types: Cigarettes    Quit date: 05/07/1969  . Smokeless tobacco: Current User    Types: Snuff  . Alcohol Use: 2.4 oz/week    4 Cans of beer per week     ETOH-pt states quit 12/2011      Review of Systems  Allergies  Black  pepper  Home Medications   Current Outpatient Rx  Name Route Sig Dispense Refill  . ALBUTEROL SULFATE HFA 108 (90 BASE) MCG/ACT IN AERS Inhalation Inhale 2 puffs into the lungs every 4 (four) hours as needed for wheezing. 1 Inhaler 3  . DIAZEPAM 5 MG PO TABS Oral Take 5 mg by mouth 2 (two) times daily.    Marland Kitchen HYDROCODONE-ACETAMINOPHEN 5-325 MG PO TABS Oral Take 1 tablet by mouth 4 (four) times daily as needed. Pain.    . IPRATROPIUM-ALBUTEROL 20-100 MCG/ACT IN AERS Inhalation Inhale 1 puff into the lungs every 6 (six) hours.    Marland Kitchen LEVALBUTEROL TARTRATE 45 MCG/ACT IN AERO Inhalation Inhale 1-2 puffs into the lungs 2 (two) times daily. *Uses with an Aerochamber*    . PREDNISONE 10 MG PO TABS Oral Take 2 tablets (20 mg total) by mouth daily. 15 tablet 0  . PREDNISONE 20 MG PO TABS Oral Take 2 tablets (40 mg total) by mouth daily. 10 tablet 0    BP 153/72  Pulse 88  Temp 98.4 F (36.9 C) (Oral)  Resp 16  SpO2 95%  Physical Exam  ED Course  Procedures (including critical care time)  Labs Reviewed - No data to display Dg Chest 2 View  08/26/2012  *RADIOLOGY REPORT*  Clinical Data: Shortness of breath.  CHEST - 2 VIEW  Comparison:  Single view of the chest 08/16/2012 and PA and lateral chest 04/03/2010.  Findings: Lungs appear emphysematous but are clear.  No pneumothorax or pleural fluid.  Heart size upper normal.  IMPRESSION: Hyperexpansion compatible with emphysema.  No acute finding.   Original Report Authenticated By: Bernadene Bell. D'ALESSIO, M.D.      1. COPD (chronic obstructive pulmonary disease)     No exacerbation now  MDM          [name], MD 08/26/12 2220

## 2012-08-26 NOTE — ED Provider Notes (Signed)
History   This chart was scribed for [name], MD by Melba Coon. The patient was seen in room APA11/[facility]11 and the patient's care was started at 10:00PM.    CSN: [id number]  Arrival date & time 08/26/12  4098   First MD Initiated Contact with Patient 08/26/12 2157      Chief Complaint  Patient presents with  . Shortness of Breath    (Consider location/radiation/quality/duration/timing/severity/associated sxs/prior treatment) Patient is a 78 y.o. male presenting with shortness of breath. The history is provided by the patient. No language interpreter was used.  Shortness of Breath  The current episode started today (chronic). The onset was sudden. The problem has been unchanged. The problem is moderate. The symptoms are relieved by beta-agonist inhalers and one or more prescription drugs. Associated symptoms include rhinorrhea, cough (productive), shortness of breath and wheezing. Pertinent negatives include no chest pain and no fever. There was no intake of a foreign body. He is currently using steroids. His past medical history is significant for past wheezing. Past medical history comments: COPD. He has been behaving normally. Recently, medical care has been given at this facility. Services received include medications given and tests performed.  He has been to the ED 41 times in the last 6 months for the same symptoms and was here yesterday. He was given an inhaler and prednisone. He has been repeatedly advised to f/u with his PCP, Dr. Juanetta Gosling, which he has not done.  Past Medical History  Diagnosis Date  . COPD (chronic obstructive pulmonary disease)   . Anxiety   . Alcohol abuse   . Anemia   . Generalized headaches   . Hypertension   . 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 Pulmonary nodule/lesion, solitary   . Schizoaffective disorder   . GI bleed     History of recurrent bleeding that dates back as far as  2004 per records,    Past Surgical History  Procedure Date  . Colonoscopy Sept 2009    SLF: few small AVMs in ascending colon distal to IC . Ablated via BICAP.   Marland Kitchen Esophagogastroduodenoscopy Sept 2009    SLF: normal esophagus, small HH, 1-2 AVMs actively oozing in mid stomach, s/p BICAP, path benign   . Esophagogastroduodenoscopy 01/18/2012    Rourk-Erosive reflux esophagitis. Noncritical ring. Antral erosions/ small antral ulcer - appeared innocent  (H pylori serrology negative)  . Colonoscopy 03/09/2012    Procedure: COLONOSCOPY;  Surgeon: West Bali, MD;  Location: [facility];  Service: Endoscopy;  Laterality: N/A;  2:30  . Esophagogastroduodenoscopy 03/09/2012    Procedure: ESOPHAGOGASTRODUODENOSCOPY (EGD);  Surgeon: West Bali, MD;  Location: [facility];  Service: Endoscopy;  Laterality: N/A;    Family History  Problem Relation Age of Onset  . Colon cancer Neg Hx     History  Substance Use Topics  . Smoking status: Former Smoker    Types: Cigarettes    Quit date: 05/07/1969  . Smokeless tobacco: Current User    Types: Snuff  . Alcohol Use: 2.4 oz/week    4 Cans of beer per week     ETOH-pt states quit 12/2011      Review of Systems  Constitutional: Negative for fever and fatigue.  HENT: Positive for congestion and rhinorrhea. Negative for sinus pressure and ear discharge.        (+) headache  Eyes: Negative for discharge.  Respiratory: Positive for cough (productive), shortness of breath and wheezing.   Cardiovascular: Negative for chest pain.  Gastrointestinal: Negative for abdominal pain and diarrhea.  Genitourinary: Negative for frequency and hematuria.  Musculoskeletal: Negative for back pain.  Skin: Negative for rash.  Neurological: Negative for seizures and headaches.  Hematological: Negative.   Psychiatric/Behavioral: Negative for hallucinations.  All other systems reviewed and are negative.    Allergies  Black pepper  Home Medications    Current Outpatient Rx  Name Route Sig Dispense Refill  . ALBUTEROL SULFATE HFA 108 (90 BASE) MCG/ACT IN AERS Inhalation Inhale 2 puffs into the lungs every 4 (four) hours as needed for wheezing. 1 Inhaler 3  . DIAZEPAM 5 MG PO TABS Oral Take 5 mg by mouth 2 (two) times daily.    Marland Kitchen HYDROCODONE-ACETAMINOPHEN 5-325 MG PO TABS Oral Take 1 tablet by mouth 4 (four) times daily as needed. Pain.    . IPRATROPIUM-ALBUTEROL 20-100 MCG/ACT IN AERS Inhalation Inhale 1 puff into the lungs every 6 (six) hours.    Marland Kitchen LEVALBUTEROL TARTRATE 45 MCG/ACT IN AERO Inhalation Inhale 1-2 puffs into the lungs 2 (two) times daily. *Uses with an Aerochamber*    . PREDNISONE 10 MG PO TABS Oral Take 2 tablets (20 mg total) by mouth daily. 15 tablet 0  . PREDNISONE 20 MG PO TABS Oral Take 2 tablets (40 mg total) by mouth daily. 10 tablet 0    BP 153/72  Pulse 88  Temp 98.4 F (36.9 C) (Oral)  Resp 16  SpO2 95%  Physical Exam  Nursing note and vitals reviewed. Constitutional: He is oriented to person, place, and time. He appears well-developed.  HENT:  Head: Normocephalic and atraumatic.  Eyes: Conjunctivae normal and EOM are normal. No scleral icterus.  Neck: Neck supple. No thyromegaly present.  Cardiovascular: Normal rate and regular rhythm.  Exam reveals no gallop and no friction rub.   No murmur heard. Pulmonary/Chest: No stridor. He has no wheezes. He has no rales. He exhibits no tenderness.  Abdominal: He exhibits no distension. There is no tenderness. There is no rebound.  Musculoskeletal: Normal range of motion. He exhibits no edema.  Lymphadenopathy:    He has no cervical adenopathy.  Neurological: He is oriented to person, place, and time. Coordination normal.  Skin: No rash noted. No erythema.  Psychiatric: He has a normal mood and affect. His behavior is normal.    ED Course  Procedures (including critical care time)  DIAGNOSTIC STUDIES: Oxygen Saturation is 95% on room air, adequate by my  interpretation.    COORDINATION OF CARE:  10:05PM - Tylenol will be given to Mr Cusmano for his headache. He will not be given any medications for his breathing. He is again advised to f/u with his PCP. He is ready for d/c.   Labs Reviewed - No data to display Dg Chest 2 View  08/26/2012  *RADIOLOGY REPORT*  Clinical Data: Shortness of breath.  CHEST - 2 VIEW  Comparison: Single view of the chest 08/16/2012 and PA and lateral chest 04/03/2010.  Findings: Lungs appear emphysematous but are clear.  No pneumothorax or pleural fluid.  Heart size upper normal.  IMPRESSION: Hyperexpansion compatible with emphysema.  No acute finding.   Original Report Authenticated By: Bernadene Bell. Maricela Curet, M.D.      No diagnosis found.    MDM    The chart was scribed for me under my direct supervision.  I personally performed the history, physical, and medical decision making  and all procedures in the evaluation of this patient.[name], MD 08/26/12 2223

## 2012-08-26 NOTE — ED Notes (Signed)
Pt states he was given an inhaler yesterday and now he is sob. Pt states he needs a chest xray. Has something in chest but can't cough it up.

## 2012-08-29 ENCOUNTER — Emergency Department (HOSPITAL_COMMUNITY)
Admission: EM | Admit: 2012-08-29 | Discharge: 2012-08-29 | Discharge status: Against Medical Advice | Payer: PRIVATE HEALTH INSURANCE | Attending: Family Medicine | Admitting: Family Medicine

## 2012-08-29 ENCOUNTER — Encounter (HOSPITAL_COMMUNITY): Payer: Self-pay | Admitting: *Deleted

## 2012-08-29 DIAGNOSIS — Y9289 Other specified places as the place of occurrence of the external cause: Secondary | ICD-10-CM | POA: Insufficient documentation

## 2012-08-29 DIAGNOSIS — W28XXXA Contact with powered lawn mower, initial encounter: Secondary | ICD-10-CM | POA: Insufficient documentation

## 2012-08-29 DIAGNOSIS — Y9389 Activity, other specified: Secondary | ICD-10-CM | POA: Insufficient documentation

## 2012-08-29 DIAGNOSIS — S61209A Unspecified open wound of unspecified finger without damage to nail, initial encounter: Secondary | ICD-10-CM | POA: Insufficient documentation

## 2012-08-29 NOTE — ED Notes (Signed)
Laceration to left index finger.   

## 2012-08-29 NOTE — ED Notes (Signed)
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

## 2012-08-29 NOTE — ED Notes (Signed)
No answer when pt called to the treatment room.

## 2012-09-04 ENCOUNTER — Emergency Department (HOSPITAL_COMMUNITY)
Admission: EM | Admit: 2012-09-04 | Discharge: 2012-09-04 | Disposition: A | Discharge status: Home / Self Care | Payer: PRIVATE HEALTH INSURANCE | Attending: Emergency Medicine | Admitting: Emergency Medicine

## 2012-09-04 ENCOUNTER — Encounter (HOSPITAL_COMMUNITY): Payer: Self-pay | Admitting: *Deleted

## 2012-09-04 DIAGNOSIS — R911 Solitary pulmonary nodule: Secondary | ICD-10-CM | POA: Insufficient documentation

## 2012-09-04 DIAGNOSIS — IMO0002 Reserved for concepts with insufficient information to code with codable children: Secondary | ICD-10-CM | POA: Insufficient documentation

## 2012-09-04 DIAGNOSIS — Z8659 Personal history of other mental and behavioral disorders: Secondary | ICD-10-CM | POA: Insufficient documentation

## 2012-09-04 DIAGNOSIS — F10929 Alcohol use, unspecified with intoxication, unspecified: Secondary | ICD-10-CM

## 2012-09-04 DIAGNOSIS — Z862 Personal history of diseases of the blood and blood-forming organs and certain disorders involving the immune mechanism: Secondary | ICD-10-CM | POA: Insufficient documentation

## 2012-09-04 DIAGNOSIS — R4789 Other speech disturbances: Secondary | ICD-10-CM | POA: Insufficient documentation

## 2012-09-04 DIAGNOSIS — Z79899 Other long term (current) drug therapy: Secondary | ICD-10-CM | POA: Insufficient documentation

## 2012-09-04 DIAGNOSIS — F411 Generalized anxiety disorder: Secondary | ICD-10-CM | POA: Insufficient documentation

## 2012-09-04 DIAGNOSIS — F10229 Alcohol dependence with intoxication, unspecified: Secondary | ICD-10-CM | POA: Insufficient documentation

## 2012-09-04 DIAGNOSIS — Z87891 Personal history of nicotine dependence: Secondary | ICD-10-CM | POA: Insufficient documentation

## 2012-09-04 DIAGNOSIS — I10 Essential (primary) hypertension: Secondary | ICD-10-CM | POA: Insufficient documentation

## 2012-09-04 NOTE — ED Provider Notes (Signed)
History   This chart was scribed for Flint Melter, MD by Charolett Bumpers . The patient was seen in room APA07/APA07. Patient's care was started at 2016.   CSN: [id number]  Arrival date & time 09/04/12  1924   First MD Initiated Contact with Patient 09/04/12 2016      Chief Complaint  Patient presents with  . Shortness of Breath   Level 5 Caveat: Intoxication.  The history is provided by the patient. The history is limited by the condition of the patient. No language interpreter was used.   [name] is a 78 y.o. male who presents to the Emergency Department complaining of chronic moderate SOB. He is sleepy and has slurred speech here in ED. He reports he had 1 beer earlier tonight. He states he drove his lawnmower to ED. He is a frequent visitor to the ED for SOB.   Past Medical History  Diagnosis Date  . COPD (chronic obstructive pulmonary disease)   . Anxiety   . Alcohol abuse   . Anemia   . Generalized headaches   . Hypertension   . 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 Pulmonary nodule/lesion, solitary   . Schizoaffective disorder   . GI bleed     History of recurrent bleeding that dates back as far as 2004 per records,    Past Surgical History  Procedure Date  . Colonoscopy Sept 2009    SLF: few small AVMs in ascending colon distal to IC . Ablated via BICAP.   Marland Kitchen Esophagogastroduodenoscopy Sept 2009    SLF: normal esophagus, small HH, 1-2 AVMs actively oozing in mid stomach, s/p BICAP, path benign   . Esophagogastroduodenoscopy 01/18/2012    Rourk-Erosive reflux esophagitis. Noncritical ring. Antral erosions/ small antral ulcer - appeared innocent  (H pylori serrology negative)  . Colonoscopy 03/09/2012    Procedure: COLONOSCOPY;  Surgeon: West Bali, MD;  [facility]: [facility];  Service: Endoscopy;  Laterality: N/A;  2:30  . Esophagogastroduodenoscopy 03/09/2012    Procedure:  ESOPHAGOGASTRODUODENOSCOPY (EGD);  Surgeon: West Bali, MD;  Location: [facility];  Service: Endoscopy;  Laterality: N/A;    Family History  Problem Relation Age of Onset  . Colon cancer Neg Hx     History  Substance Use Topics  . Smoking status: Former Smoker    Types: Cigarettes    Quit date: 05/07/1969  . Smokeless tobacco: Current User    Types: Snuff  . Alcohol Use: 2.4 oz/week    4 Cans of beer per week     Comment: ETOH-pt states quit 12/2011      Review of Systems  Unable to perform ROS: Other  Condition of the pt, intoxication.   Allergies  Black pepper  Home Medications   Current Outpatient Rx  Name  Route  Sig  Dispense  Refill  . ALBUTEROL SULFATE HFA 108 (90 BASE) MCG/ACT IN AERS   Inhalation   Inhale 2 puffs into the lungs every 4 (four) hours as needed for wheezing.   1 Inhaler   3   . DIAZEPAM 5 MG PO TABS   Oral   Take 5 mg by mouth 2 (two) times daily.         Marland Kitchen HYDROCODONE-ACETAMINOPHEN 5-325 MG PO TABS   Oral   Take 1 tablet by mouth 4 (four) times daily as needed. Pain.         Marland Kitchen  IPRATROPIUM-ALBUTEROL 20-100 MCG/ACT IN AERS   Inhalation   Inhale 1 puff into the lungs every 6 (six) hours.         Marland Kitchen LEVALBUTEROL TARTRATE 45 MCG/ACT IN AERO   Inhalation   Inhale 1-2 puffs into the lungs 2 (two) times daily. *Uses with an Aerochamber*         . PREDNISONE 10 MG PO TABS   Oral   Take 2 tablets (20 mg total) by mouth daily.   15 tablet   0   . PREDNISONE 20 MG PO TABS   Oral   Take 2 tablets (40 mg total) by mouth daily.   10 tablet   0     BP 118/71  Pulse 91  Temp 98 F (36.7 C) (Oral)  Resp 20  SpO2 96%  Physical Exam  Nursing note and vitals reviewed. Constitutional: He is oriented to person, place, and time. He appears well-developed and well-nourished. He appears lethargic. No distress.       Lethargic.  HENT:  Head: Normocephalic and atraumatic.  Right Ear: External ear normal.  Left Ear: External  ear normal.  Nose: Nose normal.  Eyes: Conjunctivae normal and EOM are normal.  Neck: Neck supple. No tracheal deviation present.  Cardiovascular: Normal rate, regular rhythm and normal heart sounds.   No murmur heard. Pulmonary/Chest: Effort normal and breath sounds normal. No respiratory distress. He has no wheezes.  Abdominal: He exhibits no distension.  Musculoskeletal: Normal range of motion.  Neurological: He is oriented to person, place, and time. He appears lethargic. No sensory deficit.       Slightly ataxic gait, speech slurred.   Skin: Skin is dry.  Psychiatric: He has a normal mood and affect. His behavior is normal. His speech is slurred.    ED Course  Procedures (including critical care time)  DIAGNOSTIC STUDIES: Oxygen Saturation is 96% on room air, adequate by my interpretation.    COORDINATION OF CARE:  20:20-Discussed planned course of treatment with the patient including ambulating in ED, who is agreeable at this time.   1. Alcohol intoxication       MDM  Alcohol intoxication. Patient drove a vehicle to the emergency department.  [place] city police contacted, and transported the patient from the emergency department.  I personally performed the services described in this documentation, which was scribed in my presence. The recorded information has been reviewed and considered.        Flint Melter, MD 09/04/12 2126

## 2012-09-04 NOTE — ED Notes (Signed)
C/o shortness of breath 

## 2012-09-04 NOTE — ED Notes (Signed)
Patient ambulated around nurses station. No needs voiced at this time.

## 2012-09-04 NOTE — ED Notes (Signed)
Pt admits to drinking a beer PTA, no distress noted at this time

## 2012-09-06 ENCOUNTER — Emergency Department (HOSPITAL_COMMUNITY)
Admission: EM | Admit: 2012-09-06 | Discharge: 2012-09-06 | Discharge status: Against Medical Advice | Payer: PRIVATE HEALTH INSURANCE | Attending: Emergency Medicine | Admitting: Emergency Medicine

## 2012-09-06 ENCOUNTER — Emergency Department (HOSPITAL_COMMUNITY): Payer: PRIVATE HEALTH INSURANCE

## 2012-09-06 ENCOUNTER — Emergency Department (HOSPITAL_COMMUNITY)
Admission: EM | Admit: 2012-09-06 | Discharge: 2012-09-06 | Disposition: A | Discharge status: Home / Self Care | Payer: PRIVATE HEALTH INSURANCE | Attending: Emergency Medicine | Admitting: Emergency Medicine

## 2012-09-06 ENCOUNTER — Encounter (HOSPITAL_COMMUNITY): Payer: Self-pay | Admitting: *Deleted

## 2012-09-06 ENCOUNTER — Encounter (HOSPITAL_COMMUNITY): Payer: Self-pay | Admitting: Emergency Medicine

## 2012-09-06 DIAGNOSIS — Z8669 Personal history of other diseases of the nervous system and sense organs: Secondary | ICD-10-CM | POA: Insufficient documentation

## 2012-09-06 DIAGNOSIS — I10 Essential (primary) hypertension: Secondary | ICD-10-CM | POA: Insufficient documentation

## 2012-09-06 DIAGNOSIS — Y939 Activity, unspecified: Secondary | ICD-10-CM | POA: Insufficient documentation

## 2012-09-06 DIAGNOSIS — Z862 Personal history of diseases of the blood and blood-forming organs and certain disorders involving the immune mechanism: Secondary | ICD-10-CM | POA: Insufficient documentation

## 2012-09-06 DIAGNOSIS — J449 Chronic obstructive pulmonary disease, unspecified: Secondary | ICD-10-CM | POA: Insufficient documentation

## 2012-09-06 DIAGNOSIS — Z8719 Personal history of other diseases of the digestive system: Secondary | ICD-10-CM | POA: Insufficient documentation

## 2012-09-06 DIAGNOSIS — F411 Generalized anxiety disorder: Secondary | ICD-10-CM | POA: Insufficient documentation

## 2012-09-06 DIAGNOSIS — F259 Schizoaffective disorder, unspecified: Secondary | ICD-10-CM | POA: Insufficient documentation

## 2012-09-06 DIAGNOSIS — Z8709 Personal history of other diseases of the respiratory system: Secondary | ICD-10-CM | POA: Insufficient documentation

## 2012-09-06 DIAGNOSIS — F1010 Alcohol abuse, uncomplicated: Secondary | ICD-10-CM | POA: Insufficient documentation

## 2012-09-06 DIAGNOSIS — J4489 Other specified chronic obstructive pulmonary disease: Secondary | ICD-10-CM | POA: Insufficient documentation

## 2012-09-06 DIAGNOSIS — R911 Solitary pulmonary nodule: Secondary | ICD-10-CM | POA: Insufficient documentation

## 2012-09-06 DIAGNOSIS — Z791 Long term (current) use of non-steroidal anti-inflammatories (NSAID): Secondary | ICD-10-CM | POA: Insufficient documentation

## 2012-09-06 DIAGNOSIS — Z79899 Other long term (current) drug therapy: Secondary | ICD-10-CM | POA: Insufficient documentation

## 2012-09-06 DIAGNOSIS — Z87891 Personal history of nicotine dependence: Secondary | ICD-10-CM | POA: Insufficient documentation

## 2012-09-06 DIAGNOSIS — Z8659 Personal history of other mental and behavioral disorders: Secondary | ICD-10-CM | POA: Insufficient documentation

## 2012-09-06 DIAGNOSIS — T148XXA Other injury of unspecified body region, initial encounter: Secondary | ICD-10-CM

## 2012-09-06 DIAGNOSIS — Y929 Unspecified place or not applicable: Secondary | ICD-10-CM | POA: Insufficient documentation

## 2012-09-06 DIAGNOSIS — IMO0002 Reserved for concepts with insufficient information to code with codable children: Secondary | ICD-10-CM | POA: Insufficient documentation

## 2012-09-06 DIAGNOSIS — X58XXXA Exposure to other specified factors, initial encounter: Secondary | ICD-10-CM | POA: Insufficient documentation

## 2012-09-06 DIAGNOSIS — R0602 Shortness of breath: Secondary | ICD-10-CM

## 2012-09-06 MED ORDER — GI COCKTAIL ~~LOC~~
30.0000 mL | Freq: Once | ORAL | Status: DC
  Filled 2012-09-06: qty 30

## 2012-09-06 NOTE — ED Notes (Signed)
Pt states that he was just released from the er for leg pain and when he got home he felt like he was getting "something" stuck in his throat and sob, pt then rode his lawnmower to Dr. Adah Perl office which was closed and then preceded to come to the er.

## 2012-09-06 NOTE — ED Notes (Signed)
Pt says he feels sob, feels like something in his throat.  Pt has been wanded

## 2012-09-06 NOTE — ED Notes (Signed)
Pt returned from xray, per xray staff pt "cursing" at them, states that he is going to leave when they return from xray.

## 2012-09-06 NOTE — ED Notes (Addendum)
Pt out to front desk states " I am tired of waiting", delay explained to pt, pt refusing to have blood work performed, states " I am going to [facility]", AMA form explained to pt and signed by pt, pt expressed understanding of ramifications of leaving the hospital without being examined. Pt states " I understand".

## 2012-09-06 NOTE — ED Notes (Signed)
Pt c/o bilateral calf pain since last night. Pt states he can not sleep because of the pain.

## 2012-09-06 NOTE — ED Provider Notes (Signed)
History     CSN: [id number]  Arrival date & time 09/06/12  1305   First MD Initiated Contact with Patient 09/06/12 1324      Chief Complaint  Patient presents with  . Shortness of Breath    (Consider location/radiation/quality/duration/timing/severity/associated sxs/prior treatment) HPI Comments: Patient presents to the ED for his second visit of the day complaining of a feeling of something stuck in his throat that is making her short of breath. Denies any chest pain, cough or fever. He states his pain has been going on for the past day. Denies any nausea, vomiting or abdominal pain.  The history is provided by the patient.    Past Medical History  Diagnosis Date  . COPD (chronic obstructive pulmonary disease)   . Anxiety   . Alcohol abuse   . Anemia   . Generalized headaches   . Hypertension   . 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 Pulmonary nodule/lesion, solitary   . Schizoaffective disorder   . GI bleed     History of recurrent bleeding that dates back as far as 2004 per records,    Past Surgical History  Procedure Date  . Colonoscopy Sept 2009    SLF: few small AVMs in ascending colon distal to IC . Ablated via BICAP.   Marland Kitchen Esophagogastroduodenoscopy Sept 2009    SLF: normal esophagus, small HH, 1-2 AVMs actively oozing in mid stomach, s/p BICAP, path benign   . Esophagogastroduodenoscopy 01/18/2012    Rourk-Erosive reflux esophagitis. Noncritical ring. Antral erosions/ small antral ulcer - appeared innocent  (H pylori serrology negative)  . Colonoscopy 03/09/2012    Procedure: COLONOSCOPY;  Surgeon: West Bali, MD;  [facility];  Service: Endoscopy;  Laterality: N/A;  2:30  . Esophagogastroduodenoscopy 03/09/2012    Procedure: ESOPHAGOGASTRODUODENOSCOPY (EGD);  Surgeon: West Bali, MD;  [facility]: [facility];  Service: Endoscopy;  Laterality: N/A;    Family History  Problem Relation  Age of Onset  . Colon cancer Neg Hx     History  Substance Use Topics  . Smoking status: Former Smoker    Types: Cigarettes    Quit date: 05/07/1969  . Smokeless tobacco: Current User    Types: Snuff  . Alcohol Use: 2.4 oz/week    4 Cans of beer per week     Comment: ETOH-pt states quit 12/2011      Review of Systems  Constitutional: Negative for fever.  Respiratory: Positive for shortness of breath. Negative for chest tightness.   Cardiovascular: Negative for chest pain.  Gastrointestinal: Negative for nausea, vomiting and abdominal pain.  Neurological: Negative for headaches.    Allergies  Black pepper  Home Medications   Current Outpatient Rx  Name  Route  Sig  Dispense  Refill  . ALBUTEROL SULFATE HFA 108 (90 BASE) MCG/ACT IN AERS   Inhalation   Inhale 2 puffs into the lungs every 4 (four) hours as needed for wheezing.   1 Inhaler   3   . CELECOXIB 200 MG PO CAPS   Oral   Take 200 mg by mouth 2 (two) times daily.         Marland Kitchen DIAZEPAM 5 MG PO TABS   Oral   Take 5 mg by mouth 3 (three) times daily.          Marland Kitchen HYDROCODONE-ACETAMINOPHEN 5-325 MG PO TABS   Oral   Take  1 tablet by mouth 4 (four) times daily as needed. Pain.         Marland Kitchen LEVALBUTEROL TARTRATE 45 MCG/ACT IN AERO   Inhalation   Inhale 1-2 puffs into the lungs 2 (two) times daily. *Uses with an Aerochamber*         . LEVOFLOXACIN 500 MG PO TABS   Oral   Take 500 mg by mouth daily.           BP 114/68  Pulse 110  Temp 98.2 F (36.8 C) (Oral)  Resp 20  Wt 118 lb (53.524 kg)  SpO2 96%  Physical Exam  Constitutional: He is oriented to person, place, and time. He appears well-developed and well-nourished. No distress.  HENT:  Head: Normocephalic and atraumatic.  Mouth/Throat: Oropharynx is clear and moist. No oropharyngeal exudate.  Eyes: Conjunctivae normal are normal. Pupils are equal, round, and reactive to light.  Neck: Normal range of motion.  Cardiovascular: Normal rate,  regular rhythm and normal heart sounds.   No murmur heard.      tachycardia  Pulmonary/Chest: Effort normal and breath sounds normal. No respiratory distress. He has no wheezes.  Abdominal: Soft. There is no tenderness. There is no rebound and no guarding.  Musculoskeletal: Normal range of motion. He exhibits no edema and no tenderness.  Neurological: He is alert and oriented to person, place, and time. No cranial nerve deficit. He exhibits normal muscle tone. Coordination normal.  Skin: Skin is warm.    ED Course  Procedures (including critical care time)   Labs Reviewed  CBC WITH DIFFERENTIAL  COMPREHENSIVE METABOLIC PANEL  TROPONIN I   Dg Abd Acute W/chest  09/06/2012  *RADIOLOGY REPORT*  Clinical Data: Shortness of breath, abdominal pain  ACUTE ABDOMEN SERIES (ABDOMEN 2 VIEW & CHEST 1 VIEW)  Comparison: 08/26/2012  Findings: Heart size upper normal.  Mediastinal contours unchanged. No confluent airspace opacity.  Tiny calcified appearing nodule right upper lobe favors a granuloma. Emphysematous changes again suggested.  No free intraperitoneal air.  Nonspecific bowel gas pattern with air noted within loops of large and small bowel.  Multilevel degenerative changes.  No acute osseous finding. Vascular calcifications and nonspecific calcific densities along the left pelvic sidewall.  IMPRESSION: Nonspecific bowel gas pattern without evidence for obstruction.   Original Report Authenticated By: Jearld Lesch, M.D.      1. Shortness of breath       MDM  Patient well known to the ED and notoriously leaves before workup is complete. He is in no distress his lungs are clear he is mildly tachycardic.  EKG unchanged, chest x-ray negative. Patient wandering the hallways stating that he is tired : He is in a person and and are awaiting a waiting around. He states he is going to go to [facility] where they know what they are doing. He is alert and oriented and clinically sober and will leave  against medical advice.   Date: 09/06/2012  Rate: 93  Rhythm: sinus arrhythmia  QRS Axis: normal  Intervals: normal  ST/T Wave abnormalities: normal and indeterminate  Conduction Disutrbances:none  Narrative Interpretation:   Old EKG Reviewed: unchanged        Glynn Octave, MD 09/06/12 1552

## 2012-09-07 NOTE — ED Provider Notes (Signed)
History     CSN: [id number]  Arrival date & time 09/06/12  7846   First MD Initiated Contact with Patient 09/06/12 0940      Chief Complaint  Patient presents with  . Leg Pain    (Consider location/radiation/quality/duration/timing/severity/associated sxs/prior treatment) HPI Comments: [name] presents for evaluation of bilateral lower calf pain yesterday which kept him awake,  But is improved this morning.  He denies any falls or other injury to his legs and also denies swelling in the legs,  Chest pain or shortness of breath.  He was seen here 2 days ago at which time he was intoxicated.  He denies any etoh use since that visit.  He has taken no medications for his leg pain.    The history is provided by the patient.    Past Medical History  Diagnosis Date  . COPD (chronic obstructive pulmonary disease)   . Anxiety   . Alcohol abuse   . Anemia   . Generalized headaches   . Hypertension   . 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 Pulmonary nodule/lesion, solitary   . Schizoaffective disorder   . GI bleed     History of recurrent bleeding that dates back as far as 2004 per records,    Past Surgical History  Procedure Date  . Colonoscopy Sept 2009    SLF: few small AVMs in ascending colon distal to IC . Ablated via BICAP.   Marland Kitchen Esophagogastroduodenoscopy Sept 2009    SLF: normal esophagus, small HH, 1-2 AVMs actively oozing in mid stomach, s/p BICAP, path benign   . Esophagogastroduodenoscopy 01/18/2012    Rourk-Erosive reflux esophagitis. Noncritical ring. Antral erosions/ small antral ulcer - appeared innocent  (H pylori serrology negative)  . Colonoscopy 03/09/2012    Procedure: COLONOSCOPY;  Surgeon: West Bali, MD;  [facility];  Service: Endoscopy;  Laterality: N/A;  2:30  . Esophagogastroduodenoscopy 03/09/2012    Procedure: ESOPHAGOGASTRODUODENOSCOPY (EGD);  Surgeon: West Bali, MD;   [facility];  Service: Endoscopy;  Laterality: N/A;    Family History  Problem Relation Age of Onset  . Colon cancer Neg Hx     History  Substance Use Topics  . Smoking status: Former Smoker    Types: Cigarettes    Quit date: 05/07/1969  . Smokeless tobacco: Current User    Types: Snuff  . Alcohol Use: 2.4 oz/week    4 Cans of beer per week     Comment: ETOH-pt states quit 12/2011      Review of Systems  Musculoskeletal: Positive for arthralgias. Negative for joint swelling and gait problem.  Skin: Negative for wound.  Neurological: Negative for weakness and numbness.    Allergies  Black pepper  Home Medications   Current Outpatient Rx  Name  Route  Sig  Dispense  Refill  . ALBUTEROL SULFATE HFA 108 (90 BASE) MCG/ACT IN AERS   Inhalation   Inhale 2 puffs into the lungs every 4 (four) hours as needed for wheezing.   1 Inhaler   3   . CELECOXIB 200 MG PO CAPS   Oral   Take 200 mg by mouth 2 (two) times daily.         Marland Kitchen DIAZEPAM 5 MG PO TABS   Oral   Take 5 mg by mouth 3 (three) times daily.          Marland Kitchen  HYDROCODONE-ACETAMINOPHEN 5-325 MG PO TABS   Oral   Take 1 tablet by mouth 4 (four) times daily as needed. Pain.         Marland Kitchen LEVALBUTEROL TARTRATE 45 MCG/ACT IN AERO   Inhalation   Inhale 1-2 puffs into the lungs 2 (two) times daily. *Uses with an Aerochamber*         . LEVOFLOXACIN 500 MG PO TABS   Oral   Take 500 mg by mouth daily.           BP 133/58  Pulse 88  Resp 20  Wt 125 lb (56.7 kg)  SpO2 94%  Physical Exam  Constitutional: He appears well-developed and well-nourished.  HENT:  Head: Atraumatic.  Neck: Normal range of motion.  Cardiovascular:       Pulses equal bilaterally  Musculoskeletal: He exhibits no edema and no tenderness.       No muscle spasm, no cords or palpable nodules in lower extremities.  Pedal pulses intact and normal.  Skin also normal with no rash,  No ecchymosis.  Neurological: He is alert. He has  normal strength. He displays normal reflexes. No sensory deficit.       Equal strength  Skin: Skin is warm and dry.  Psychiatric: He has a normal mood and affect.    ED Course  Procedures (including critical care time)  Labs Reviewed - No data to display Dg Abd Acute W/chest  09/06/2012  *RADIOLOGY REPORT*  Clinical Data: Shortness of breath, abdominal pain  ACUTE ABDOMEN SERIES (ABDOMEN 2 VIEW & CHEST 1 VIEW)  Comparison: 08/26/2012  Findings: Heart size upper normal.  Mediastinal contours unchanged. No confluent airspace opacity.  Tiny calcified appearing nodule right upper lobe favors a granuloma. Emphysematous changes again suggested.  No free intraperitoneal air.  Nonspecific bowel gas pattern with air noted within loops of large and small bowel.  Multilevel degenerative changes.  No acute osseous finding. Vascular calcifications and nonspecific calcific densities along the left pelvic sidewall.  IMPRESSION: Nonspecific bowel gas pattern without evidence for obstruction.   Original Report Authenticated By: Jearld Lesch, M.D.      1. Muscle strain       MDM  Patient well known to the ed with frequent visits here. Normal exam today with no distress,  VSS.  Pt encouraged to f/u with pcp if sx return.  No physical exam findings to suggest trauma,  Dvt, vascular insufficiency.        Burgess Amor, [place] 09/07/12 1531

## 2012-09-07 NOTE — ED Provider Notes (Signed)
Medical screening examination/treatment/procedure(s) were performed by non-physician practitioner and as supervising physician I was immediately available for consultation/collaboration.   Glynn Octave, MD 09/07/12 Paulo Fruit

## 2012-09-09 ENCOUNTER — Encounter (HOSPITAL_COMMUNITY): Payer: Self-pay | Admitting: Emergency Medicine

## 2012-09-09 ENCOUNTER — Emergency Department (HOSPITAL_COMMUNITY)
Admission: EM | Admit: 2012-09-09 | Discharge: 2012-09-09 | Discharge status: Against Medical Advice | Payer: PRIVATE HEALTH INSURANCE | Attending: Emergency Medicine | Admitting: Emergency Medicine

## 2012-09-09 DIAGNOSIS — Z862 Personal history of diseases of the blood and blood-forming organs and certain disorders involving the immune mechanism: Secondary | ICD-10-CM | POA: Insufficient documentation

## 2012-09-09 DIAGNOSIS — F259 Schizoaffective disorder, unspecified: Secondary | ICD-10-CM | POA: Insufficient documentation

## 2012-09-09 DIAGNOSIS — J4489 Other specified chronic obstructive pulmonary disease: Secondary | ICD-10-CM | POA: Insufficient documentation

## 2012-09-09 DIAGNOSIS — I10 Essential (primary) hypertension: Secondary | ICD-10-CM | POA: Insufficient documentation

## 2012-09-09 DIAGNOSIS — Z79899 Other long term (current) drug therapy: Secondary | ICD-10-CM | POA: Insufficient documentation

## 2012-09-09 DIAGNOSIS — Z8719 Personal history of other diseases of the digestive system: Secondary | ICD-10-CM | POA: Insufficient documentation

## 2012-09-09 DIAGNOSIS — J449 Chronic obstructive pulmonary disease, unspecified: Secondary | ICD-10-CM | POA: Insufficient documentation

## 2012-09-09 DIAGNOSIS — R0602 Shortness of breath: Secondary | ICD-10-CM | POA: Insufficient documentation

## 2012-09-09 DIAGNOSIS — F411 Generalized anxiety disorder: Secondary | ICD-10-CM | POA: Insufficient documentation

## 2012-09-09 DIAGNOSIS — Z87891 Personal history of nicotine dependence: Secondary | ICD-10-CM | POA: Insufficient documentation

## 2012-09-09 NOTE — ED Notes (Signed)
Patient reports bilateral calf pain as well.

## 2012-09-09 NOTE — ED Notes (Signed)
Patient with c/o shortness of breath. Ambulatory and talking in full sentences.

## 2012-09-09 NOTE — ED Notes (Signed)
Pt out to desk states he is leaving he has been here to long. Pt encourage to go back to room and wait for the doctor. States he ws leaving before it got to cold. Pt refused to sign out.

## 2012-09-09 NOTE — ED Notes (Signed)
Pt stated he was tired of waiting and wasn't signing anything and walked out.

## 2012-09-10 ENCOUNTER — Encounter (HOSPITAL_COMMUNITY): Payer: Self-pay | Admitting: *Deleted

## 2012-09-10 ENCOUNTER — Emergency Department (HOSPITAL_COMMUNITY)
Admission: EM | Admit: 2012-09-10 | Discharge: 2012-09-11 | Disposition: A | Discharge status: Home / Self Care | Payer: PRIVATE HEALTH INSURANCE | Attending: Emergency Medicine | Admitting: Emergency Medicine

## 2012-09-10 ENCOUNTER — Emergency Department (HOSPITAL_COMMUNITY)
Admission: EM | Admit: 2012-09-10 | Discharge: 2012-09-10 | Disposition: A | Discharge status: Home / Self Care | Payer: PRIVATE HEALTH INSURANCE | Attending: Emergency Medicine | Admitting: Emergency Medicine

## 2012-09-10 DIAGNOSIS — Z87891 Personal history of nicotine dependence: Secondary | ICD-10-CM | POA: Insufficient documentation

## 2012-09-10 DIAGNOSIS — F259 Schizoaffective disorder, unspecified: Secondary | ICD-10-CM | POA: Insufficient documentation

## 2012-09-10 DIAGNOSIS — J449 Chronic obstructive pulmonary disease, unspecified: Secondary | ICD-10-CM

## 2012-09-10 DIAGNOSIS — F411 Generalized anxiety disorder: Secondary | ICD-10-CM | POA: Insufficient documentation

## 2012-09-10 DIAGNOSIS — F1010 Alcohol abuse, uncomplicated: Secondary | ICD-10-CM | POA: Insufficient documentation

## 2012-09-10 DIAGNOSIS — Z79899 Other long term (current) drug therapy: Secondary | ICD-10-CM | POA: Insufficient documentation

## 2012-09-10 DIAGNOSIS — F209 Schizophrenia, unspecified: Secondary | ICD-10-CM | POA: Insufficient documentation

## 2012-09-10 DIAGNOSIS — D649 Anemia, unspecified: Secondary | ICD-10-CM | POA: Insufficient documentation

## 2012-09-10 DIAGNOSIS — Z8709 Personal history of other diseases of the respiratory system: Secondary | ICD-10-CM | POA: Insufficient documentation

## 2012-09-10 DIAGNOSIS — I10 Essential (primary) hypertension: Secondary | ICD-10-CM | POA: Insufficient documentation

## 2012-09-10 DIAGNOSIS — Z8719 Personal history of other diseases of the digestive system: Secondary | ICD-10-CM | POA: Insufficient documentation

## 2012-09-10 DIAGNOSIS — J4489 Other specified chronic obstructive pulmonary disease: Secondary | ICD-10-CM | POA: Insufficient documentation

## 2012-09-10 MED ORDER — ALBUTEROL SULFATE (5 MG/ML) 0.5% IN NEBU
5.0000 mg | INHALATION_SOLUTION | Freq: Once | RESPIRATORY_TRACT | Status: AC
  Administered 2012-09-10: 5 mg via RESPIRATORY_TRACT
  Filled 2012-09-10: qty 1

## 2012-09-10 MED ORDER — ALBUTEROL SULFATE HFA 108 (90 BASE) MCG/ACT IN AERS
2.0000 | INHALATION_SPRAY | Freq: Once | RESPIRATORY_TRACT | Status: AC
  Administered 2012-09-10: 2 via RESPIRATORY_TRACT
  Filled 2012-09-10: qty 6.7

## 2012-09-10 NOTE — ED Provider Notes (Signed)
History     CSN: [id number]  Arrival date & time 09/10/12  2113   First MD Initiated Contact with Patient 09/10/12 2326      Chief Complaint  Patient presents with  . Shortness of Breath    (Consider location/radiation/quality/duration/timing/severity/associated sxs/prior treatment) HPI [name] is a 78 y.o. male with a h/o COPD, anxiety, multiple visits to the ER for shortness of breath who presents to the Emergency Department complaining of wheezing and shortness of breath. Seen int he ER earlier today for the same. He was given albuterol with some relief. He went home, ate dinner, and developed wheezing. He did not use his inhaler.   PCP Dr. Juanetta Gosling   Past Medical History  Diagnosis Date  . COPD (chronic obstructive pulmonary disease)   . Anxiety   . Alcohol abuse   . Anemia   . Generalized headaches   . Hypertension   . 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 Pulmonary nodule/lesion, solitary   . Schizoaffective disorder   . GI bleed     History of recurrent bleeding that dates back as far as 2004 per records,    Past Surgical History  Procedure Date  . Colonoscopy Sept 2009    SLF: few small AVMs in ascending colon distal to IC . Ablated via BICAP.   Marland Kitchen Esophagogastroduodenoscopy Sept 2009    SLF: normal esophagus, small HH, 1-2 AVMs actively oozing in mid stomach, s/p BICAP, path benign   . Esophagogastroduodenoscopy 01/18/2012    Rourk-Erosive reflux esophagitis. Noncritical ring. Antral erosions/ small antral ulcer - appeared innocent  (H pylori serrology negative)  . Colonoscopy 03/09/2012    Procedure: COLONOSCOPY;  Surgeon: West Bali, MD;  [facility]: [facility];  Service: Endoscopy;  Laterality: N/A;  2:30  . Esophagogastroduodenoscopy 03/09/2012    Procedure: ESOPHAGOGASTRODUODENOSCOPY (EGD);  Surgeon: West Bali, MD;  [facility]: [facility];  Service: Endoscopy;  Laterality: N/A;     Family History  Problem Relation Age of Onset  . Colon cancer Neg Hx     History  Substance Use Topics  . Smoking status: Former Smoker    Types: Cigarettes    Quit date: 05/07/1969  . Smokeless tobacco: Current User    Types: Snuff  . Alcohol Use: 2.4 oz/week    4 Cans of beer per week     Comment: ETOH-pt states quit 12/2011      Review of Systems  Constitutional: Negative for fever.       10 Systems reviewed and are negative for acute change except as noted in the HPI.  HENT: Negative for congestion.   Eyes: Negative for discharge and redness.  Respiratory: Positive for shortness of breath and wheezing. Negative for cough.   Cardiovascular: Negative for chest pain.  Gastrointestinal: Negative for vomiting and abdominal pain.  Musculoskeletal: Negative for back pain.  Skin: Negative for rash.  Neurological: Negative for syncope, numbness and headaches.  Psychiatric/Behavioral:       No behavior change.    Allergies  Black pepper  Home Medications   Current Outpatient Rx  Name  Route  Sig  Dispense  Refill  . ALBUTEROL SULFATE HFA 108 (90 BASE) MCG/ACT IN AERS   Inhalation   Inhale 2 puffs into the lungs every 4 (four) hours as needed for wheezing.   1 Inhaler   3   . CELECOXIB 200 MG PO CAPS  Oral   Take 200 mg by mouth 2 (two) times daily.         Marland Kitchen DIAZEPAM 5 MG PO TABS   Oral   Take 5 mg by mouth 3 (three) times daily.          Marland Kitchen HYDROCODONE-ACETAMINOPHEN 5-325 MG PO TABS   Oral   Take 1 tablet by mouth 4 (four) times daily as needed. Pain.         Marland Kitchen LEVALBUTEROL TARTRATE 45 MCG/ACT IN AERO   Inhalation   Inhale 1-2 puffs into the lungs 2 (two) times daily. *Uses with an Aerochamber*         . LEVOFLOXACIN 500 MG PO TABS   Oral   Take 500 mg by mouth daily.           BP 136/59  Pulse 98  Temp 98 F (36.7 C) (Oral)  Resp 24  Ht 5\' 4"  (1.626 m)  Wt 120 lb (54.432 kg)  BMI 20.60 kg/m2  SpO2 94%  Physical Exam  Nursing  note and vitals reviewed. Constitutional:       Awake, alert, nontoxic appearance.  HENT:  Head: Atraumatic.  Eyes: Right eye exhibits no discharge. Left eye exhibits no discharge.  Neck: Neck supple.  Pulmonary/Chest: Effort normal and breath sounds normal. No respiratory distress. He has no wheezes. He has no rales. He exhibits no tenderness.  Abdominal: Soft. There is no tenderness. There is no rebound.  Musculoskeletal: He exhibits no tenderness.       Baseline ROM, no obvious new focal weakness.  Neurological:       Mental status and motor strength appears baseline for patient and situation.  Skin: No rash noted.  Psychiatric: He has a normal mood and affect.    ED Course  Procedures (including critical care time)    MDM  Patient with h/o COPD here with c/o shortness of breath and wheezing. )2 sats 94% on RA. NO wheezing on exam. Pt stable in ED with no significant deterioration in condition.The patient appears reasonably screened and/or stabilized for discharge and I doubt any other medical condition or other [facility] requiring further screening, evaluation, or treatment in the ED at this time prior to discharge.  MDM Reviewed: nursing note, vitals and previous chart           Nicoletta Dress. Colon Branch, MD 09/10/12 [phone]

## 2012-09-10 NOTE — ED Notes (Signed)
Pt states that he had an episode of dizziness and weakness, denies at this time

## 2012-09-10 NOTE — ED Notes (Signed)
Pt reporting continued SOB.  No distress noted.  Seen and treated previously for same.

## 2012-09-10 NOTE — ED Notes (Signed)
Pt with continued SOB, seen here for same today

## 2012-09-10 NOTE — ED Notes (Signed)
Pt refused to wait for discharge paperwork,

## 2012-09-10 NOTE — ED Notes (Signed)
Pt with c/o SOB starting today

## 2012-09-10 NOTE — ED Provider Notes (Signed)
History     CSN: [id number]  Arrival date & time 09/10/12  1719   First MD Initiated Contact with Patient 09/10/12 1756      Chief Complaint  Patient presents with  . Shortness of Breath    (Consider location/radiation/quality/duration/timing/severity/associated sxs/prior treatment) Patient is a 78 y.o. male presenting with shortness of breath. The history is provided by the patient.  Shortness of Breath  Associated symptoms include wheezing. Pertinent negatives include no chest pain and no fever.  pt w hx copd states here to get recheck for wheezing. Multiple ed visits for same. Has rx for prednisone, states took today. Denies fever or chills. Denies cough. No chest pain. No fever or chills. Denies recent allergy symptoms. No leg pain or swelling. No orthopnea or pnd. States compliant w meds although states he has not used his inhaler today. w wheezing, pt denies exacerbating or alleviating factors, states in past relief w inhaler. Pt unclear as to why he hasnt used it today.      Past Medical History  Diagnosis Date  . COPD (chronic obstructive pulmonary disease)   . Anxiety   . Alcohol abuse   . Anemia   . Generalized headaches   . Hypertension   . 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 Pulmonary nodule/lesion, solitary   . Schizoaffective disorder   . GI bleed     History of recurrent bleeding that dates back as far as 2004 per records,    Past Surgical History  Procedure Date  . Colonoscopy Sept 2009    SLF: few small AVMs in ascending colon distal to IC . Ablated via BICAP.   Marland Kitchen Esophagogastroduodenoscopy Sept 2009    SLF: normal esophagus, small HH, 1-2 AVMs actively oozing in mid stomach, s/p BICAP, path benign   . Esophagogastroduodenoscopy 01/18/2012    Rourk-Erosive reflux esophagitis. Noncritical ring. Antral erosions/ small antral ulcer - appeared innocent  (H pylori serrology negative)  . Colonoscopy  03/09/2012    Procedure: COLONOSCOPY;  Surgeon: West Bali, MD;  Location: [facility];  Service: Endoscopy;  Laterality: N/A;  2:30  . Esophagogastroduodenoscopy 03/09/2012    Procedure: ESOPHAGOGASTRODUODENOSCOPY (EGD);  Surgeon: West Bali, MD;  Location: [facility];  Service: Endoscopy;  Laterality: N/A;    Family History  Problem Relation Age of Onset  . Colon cancer Neg Hx     History  Substance Use Topics  . Smoking status: Former Smoker    Types: Cigarettes    Quit date: 05/07/1969  . Smokeless tobacco: Current User    Types: Snuff  . Alcohol Use: 2.4 oz/week    4 Cans of beer per week     Comment: ETOH-pt states quit 12/2011      Review of Systems  Constitutional: Negative for fever and chills.  HENT: Negative for neck pain.   Eyes: Negative for redness.  Respiratory: Positive for wheezing.   Cardiovascular: Negative for chest pain, palpitations and leg swelling.  Gastrointestinal: Negative for abdominal pain.  Genitourinary: Negative for flank pain.  Musculoskeletal: Negative for back pain.  Skin: Negative for rash.  Neurological: Negative for headaches.  Hematological: Does not bruise/bleed easily.  Psychiatric/Behavioral: Negative for confusion.    Allergies  Black pepper  Home Medications   Current Outpatient Rx  Name  Route  Sig  Dispense  Refill  . ALBUTEROL SULFATE HFA 108 (90 BASE) MCG/ACT IN AERS  Inhalation   Inhale 2 puffs into the lungs every 4 (four) hours as needed for wheezing.   1 Inhaler   3   . CELECOXIB 200 MG PO CAPS   Oral   Take 200 mg by mouth 2 (two) times daily.         Marland Kitchen DIAZEPAM 5 MG PO TABS   Oral   Take 5 mg by mouth 3 (three) times daily.          Marland Kitchen HYDROCODONE-ACETAMINOPHEN 5-325 MG PO TABS   Oral   Take 1 tablet by mouth 4 (four) times daily as needed. Pain.         Marland Kitchen LEVALBUTEROL TARTRATE 45 MCG/ACT IN AERO   Inhalation   Inhale 1-2 puffs into the lungs 2 (two) times daily. *Uses with an  Aerochamber*         . LEVOFLOXACIN 500 MG PO TABS   Oral   Take 500 mg by mouth daily.           BP 125/58  Pulse 100  Temp 98.1 F (36.7 C) (Oral)  Resp 22  Ht 5\' 4"  (1.626 m)  Wt 124 lb (56.246 kg)  BMI 21.28 kg/m2  SpO2 92%  Physical Exam  Nursing note and vitals reviewed. Constitutional: He appears well-developed and well-nourished. No distress.  HENT:  Head: Atraumatic.  Eyes: Pupils are equal, round, and reactive to light.  Neck: Neck supple. No tracheal deviation present.  Cardiovascular: Normal rate, regular rhythm, normal heart sounds and intact distal pulses.   Pulmonary/Chest: Effort normal. No accessory muscle usage. No respiratory distress. He has wheezes. He exhibits tenderness.       Mild exp wheeze, good air exchange.   Abdominal: Soft. He exhibits no distension. There is no tenderness.  Musculoskeletal: Normal range of motion. He exhibits no edema and no tenderness.  Neurological: He is alert.       Steady gait  Skin: Skin is warm and dry.  Psychiatric: He has a normal mood and affect.    ED Course  Procedures (including critical care time)     MDM  Albuterol neb.   Reviewed nursing notes and prior charts for additional history.   Recheck pt requests d/c home, states he is ready to go.        Suzi Roots, MD 09/10/12 [phone]

## 2012-09-11 ENCOUNTER — Other Ambulatory Visit (HOSPITAL_COMMUNITY): Payer: Self-pay | Admitting: Pulmonary Disease

## 2012-09-11 DIAGNOSIS — R0602 Shortness of breath: Secondary | ICD-10-CM

## 2012-09-12 ENCOUNTER — Ambulatory Visit (HOSPITAL_COMMUNITY)
Admission: RE | Admit: 2012-09-12 | Discharge: 2012-09-12 | Disposition: A | Discharge status: Home / Self Care | Payer: PRIVATE HEALTH INSURANCE | Source: Ambulatory Visit | Attending: Pulmonary Disease | Admitting: Pulmonary Disease

## 2012-09-12 DIAGNOSIS — R0602 Shortness of breath: Secondary | ICD-10-CM | POA: Insufficient documentation

## 2012-09-12 MED ORDER — IOHEXOL 300 MG/ML  SOLN
80.0000 mL | Freq: Once | INTRAMUSCULAR | Status: AC | PRN
  Administered 2012-09-12: 80 mL via INTRAVENOUS

## 2012-09-13 ENCOUNTER — Emergency Department (HOSPITAL_COMMUNITY)
Admission: EM | Admit: 2012-09-13 | Discharge: 2012-09-13 | Disposition: A | Discharge status: Home / Self Care | Payer: PRIVATE HEALTH INSURANCE | Attending: Emergency Medicine | Admitting: Emergency Medicine

## 2012-09-13 ENCOUNTER — Encounter (HOSPITAL_COMMUNITY): Payer: Self-pay

## 2012-09-13 DIAGNOSIS — R0789 Other chest pain: Secondary | ICD-10-CM | POA: Insufficient documentation

## 2012-09-13 DIAGNOSIS — J4489 Other specified chronic obstructive pulmonary disease: Secondary | ICD-10-CM | POA: Insufficient documentation

## 2012-09-13 DIAGNOSIS — F259 Schizoaffective disorder, unspecified: Secondary | ICD-10-CM | POA: Insufficient documentation

## 2012-09-13 DIAGNOSIS — Z8669 Personal history of other diseases of the nervous system and sense organs: Secondary | ICD-10-CM | POA: Insufficient documentation

## 2012-09-13 DIAGNOSIS — Z79899 Other long term (current) drug therapy: Secondary | ICD-10-CM | POA: Insufficient documentation

## 2012-09-13 DIAGNOSIS — F411 Generalized anxiety disorder: Secondary | ICD-10-CM | POA: Insufficient documentation

## 2012-09-13 DIAGNOSIS — Z87891 Personal history of nicotine dependence: Secondary | ICD-10-CM | POA: Insufficient documentation

## 2012-09-13 DIAGNOSIS — Z862 Personal history of diseases of the blood and blood-forming organs and certain disorders involving the immune mechanism: Secondary | ICD-10-CM | POA: Insufficient documentation

## 2012-09-13 DIAGNOSIS — Z8719 Personal history of other diseases of the digestive system: Secondary | ICD-10-CM | POA: Insufficient documentation

## 2012-09-13 DIAGNOSIS — F1010 Alcohol abuse, uncomplicated: Secondary | ICD-10-CM | POA: Insufficient documentation

## 2012-09-13 DIAGNOSIS — J449 Chronic obstructive pulmonary disease, unspecified: Secondary | ICD-10-CM

## 2012-09-13 MED ORDER — IPRATROPIUM BROMIDE 0.02 % IN SOLN
0.5000 mg | Freq: Once | RESPIRATORY_TRACT | Status: AC
  Administered 2012-09-13: 0.5 mg via RESPIRATORY_TRACT
  Filled 2012-09-13: qty 2.5

## 2012-09-13 MED ORDER — PREDNISONE 10 MG PO TABS: 20.0000 mg | ORAL_TABLET | Freq: Every day | ORAL | Status: DC

## 2012-09-13 MED ORDER — LEVOFLOXACIN 500 MG PO TABS: 500.0000 mg | ORAL_TABLET | Freq: Every day | ORAL | Status: DC

## 2012-09-13 MED ORDER — ALBUTEROL SULFATE (5 MG/ML) 0.5% IN NEBU
2.5000 mg | INHALATION_SOLUTION | Freq: Once | RESPIRATORY_TRACT | Status: AC
  Administered 2012-09-13: 2.5 mg via RESPIRATORY_TRACT
  Filled 2012-09-13: qty 0.5

## 2012-09-13 NOTE — ED Notes (Signed)
Complain of feeling something hung in his throat

## 2012-09-13 NOTE — ED Provider Notes (Signed)
History  This chart was scribed for [name] Cooper III, MD by Ardeen Jourdain, ED Scribe. This patient was seen in room [facility] and the patient's care was started at 1011.  CSN: [id number]  Arrival date & time 09/13/12  3244   First MD Initiated Contact with Patient 09/13/12 1011      Chief Complaint  Patient presents with  . Cough     The history is provided by the patient. No language interpreter was used.    [name] is a 78 y.o. male who presents to the Emergency Department complaining of SOB with associated chest tightness. He states that the problems have been intermittent for the past few weeks, and have been gradually worsening since drinking coffee this morning. He reports using an inhaler and cough drops today with no relief. He has a h/o COPD, anxiety and HTN. He is a former smoker and a former alcohol abuser.    Past Medical History  Diagnosis Date  . COPD (chronic obstructive pulmonary disease)   . Anxiety   . Alcohol abuse   . Anemia   . Generalized headaches   . Hypertension   . 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 Pulmonary nodule/lesion, solitary   . Schizoaffective disorder   . GI bleed     History of recurrent bleeding that dates back as far as 2004 per records,    Past Surgical History  Procedure Date  . Colonoscopy Sept 2009    SLF: few small AVMs in ascending colon distal to IC . Ablated via BICAP.   Marland Kitchen Esophagogastroduodenoscopy Sept 2009    SLF: normal esophagus, small HH, 1-2 AVMs actively oozing in mid stomach, s/p BICAP, path benign   . Esophagogastroduodenoscopy 01/18/2012    Rourk-Erosive reflux esophagitis. Noncritical ring. Antral erosions/ small antral ulcer - appeared innocent  (H pylori serrology negative)  . Colonoscopy 03/09/2012    Procedure: COLONOSCOPY;  Surgeon: West Bali, MD;  Location: [facility];  Service: Endoscopy;  Laterality: N/A;  2:30  .  Esophagogastroduodenoscopy 03/09/2012    Procedure: ESOPHAGOGASTRODUODENOSCOPY (EGD);  Surgeon: West Bali, MD;  Location: [facility];  Service: Endoscopy;  Laterality: N/A;    Family History  Problem Relation Age of Onset  . Colon cancer Neg Hx     History  Substance Use Topics  . Smoking status: Former Smoker    Types: Cigarettes    Quit date: 05/07/1969  . Smokeless tobacco: Current User    Types: Snuff  . Alcohol Use: 2.4 oz/week    4 Cans of beer per week     Comment: ETOH-pt states quit 12/2011      Review of Systems  Constitutional: Negative for fever.  HENT: Negative for sore throat.   Respiratory: Positive for chest tightness and shortness of breath. Negative for cough.   Gastrointestinal: Negative for nausea and vomiting.  All other systems reviewed and are negative.    Allergies  Black pepper  Home Medications   Current Outpatient Rx  Name  Route  Sig  Dispense  Refill  . ALBUTEROL SULFATE HFA 108 (90 BASE) MCG/ACT IN AERS   Inhalation   Inhale 2 puffs into the lungs every 4 (four) hours as needed for wheezing.   1 Inhaler   3   . CELECOXIB 200 MG PO CAPS   Oral   Take 200 mg by mouth 2 (two) times daily.         Marland Kitchen  COMBIVENT RESPIMAT 20-100 MCG/ACT IN AERS   Inhalation   Inhale 20-100 mcg into the lungs Daily.         Marland Kitchen DIAZEPAM 5 MG PO TABS   Oral   Take 5 mg by mouth 3 (three) times daily.          Marland Kitchen HYDROCODONE-ACETAMINOPHEN 5-325 MG PO TABS   Oral   Take 1 tablet by mouth 4 (four) times daily as needed. Pain.         Marland Kitchen LEVOFLOXACIN 500 MG PO TABS   Oral   Take 500 mg by mouth daily.         Marland Kitchen PREDNISONE 10 MG PO TABS   Oral   Take 10 mg by mouth Daily.         Marland Kitchen LEVALBUTEROL TARTRATE 45 MCG/ACT IN AERO   Inhalation   Inhale 1-2 puffs into the lungs 2 (two) times daily. *Uses with an Aerochamber*           Triage Vitals: BP 133/70  Pulse 98  Temp 97.3 F (36.3 C) (Oral)  Resp 16  SpO2 95%  Physical Exam   Nursing note and vitals reviewed. Constitutional: He is oriented to person, place, and time. He appears well-developed and well-nourished. No distress.  HENT:  Head: Normocephalic and atraumatic.  Right Ear: External ear normal.  Left Ear: External ear normal.  Nose: Nose normal.  Mouth/Throat: Oropharynx is clear and moist.  Eyes: Conjunctivae normal and EOM are normal. Pupils are equal, round, and reactive to light.  Neck: Normal range of motion. Neck supple. No tracheal deviation present.  Cardiovascular: Normal rate, regular rhythm and normal heart sounds.   Pulmonary/Chest: Effort normal. No respiratory distress. He has no wheezes. He has no rales.       No rhonchi   Abdominal: Soft. Bowel sounds are normal. He exhibits no distension. There is no tenderness.  Musculoskeletal: Normal range of motion. He exhibits no edema.  Lymphadenopathy:    He has no cervical adenopathy.  Neurological: He is alert and oriented to person, place, and time.       Neurologically intact  Skin: Skin is warm and dry.       Dressing on left arm  Psychiatric: He has a normal mood and affect. His behavior is normal.    ED Course  Procedures (including critical care time)  DIAGNOSTIC STUDIES: Oxygen Saturation is 95% on room air, adequate by my interpretation.    COORDINATION OF CARE:  10:27 AM: Discussed treatment plan which includes a breathing treatment with pt at bedside and pt agreed to plan.  11:07 AM: Pt recheck- Pt denies having a prescription for prednisone and Levaquin, new prescriptions are being written, pt seems comfortable and normal after breathing treatment   Labs Reviewed - No data to display Ct Chest W Contrast  09/12/2012  *RADIOLOGY REPORT*  Clinical Data: Shortness of breath  CT CHEST WITH CONTRAST  Technique:  Multidetector CT imaging of the chest was performed following the standard protocol during bolus administration of intravenous contrast.  Contrast: 80mL OMNIPAQUE  IOHEXOL 300 MG/ML  SOLN  Comparison: None.  Findings: The lungs are well-aerated bilaterally without evidence of focal infiltrate or sizable effusion.  Minimal scarring is noted in the left lower lobe along the major fissure.  The small subpleural calcification is noted on image number 15 of series 3 this is most consistent with a small granuloma.  No sizable hilar or mediastinal adenopathy is noted.  The thoracic  aorta demonstrates calcification without aneurysmal dilatation.  Heavy coronary calcifications are seen.  The visualized portions of the upper abdomen are within normal limits. No acute bony abnormality is noted.  IMPRESSION: Findings of prior granulomatous disease.  Minimal scarring in the left lung base.  No acute abnormality is noted.   Original Report Authenticated By: Alcide Clever, M.D.      1. COPD (chronic obstructive pulmonary disease)     I personally performed the services described in this documentation, which was scribed in my presence. The recorded information has been reviewed and is accurate.  Osvaldo Human, MD     [name] Cooper III, MD 09/13/12 [phone]

## 2012-09-14 ENCOUNTER — Encounter (HOSPITAL_COMMUNITY): Payer: Self-pay | Admitting: *Deleted

## 2012-09-14 ENCOUNTER — Emergency Department (HOSPITAL_COMMUNITY)
Admission: EM | Admit: 2012-09-14 | Discharge: 2012-09-14 | Disposition: A | Discharge status: Home / Self Care | Payer: PRIVATE HEALTH INSURANCE | Attending: Emergency Medicine | Admitting: Emergency Medicine

## 2012-09-14 DIAGNOSIS — F259 Schizoaffective disorder, unspecified: Secondary | ICD-10-CM | POA: Insufficient documentation

## 2012-09-14 DIAGNOSIS — Z87891 Personal history of nicotine dependence: Secondary | ICD-10-CM | POA: Insufficient documentation

## 2012-09-14 DIAGNOSIS — R911 Solitary pulmonary nodule: Secondary | ICD-10-CM | POA: Insufficient documentation

## 2012-09-14 DIAGNOSIS — J441 Chronic obstructive pulmonary disease with (acute) exacerbation: Secondary | ICD-10-CM | POA: Insufficient documentation

## 2012-09-14 DIAGNOSIS — Z8719 Personal history of other diseases of the digestive system: Secondary | ICD-10-CM | POA: Insufficient documentation

## 2012-09-14 DIAGNOSIS — I10 Essential (primary) hypertension: Secondary | ICD-10-CM | POA: Insufficient documentation

## 2012-09-14 DIAGNOSIS — Z79899 Other long term (current) drug therapy: Secondary | ICD-10-CM | POA: Insufficient documentation

## 2012-09-14 DIAGNOSIS — Z862 Personal history of diseases of the blood and blood-forming organs and certain disorders involving the immune mechanism: Secondary | ICD-10-CM | POA: Insufficient documentation

## 2012-09-14 DIAGNOSIS — F411 Generalized anxiety disorder: Secondary | ICD-10-CM | POA: Insufficient documentation

## 2012-09-14 MED ORDER — PREDNISONE 20 MG PO TABS: ORAL_TABLET | ORAL | Status: DC

## 2012-09-14 MED ORDER — ALBUTEROL SULFATE (5 MG/ML) 0.5% IN NEBU
5.0000 mg | INHALATION_SOLUTION | Freq: Once | RESPIRATORY_TRACT | Status: AC
  Administered 2012-09-14: 5 mg via RESPIRATORY_TRACT
  Filled 2012-09-14: qty 1

## 2012-09-14 MED ORDER — IPRATROPIUM BROMIDE 0.02 % IN SOLN
0.5000 mg | Freq: Once | RESPIRATORY_TRACT | Status: AC
  Administered 2012-09-14: 0.5 mg via RESPIRATORY_TRACT
  Filled 2012-09-14: qty 2.5

## 2012-09-14 MED ORDER — LORAZEPAM 1 MG PO TABS
1.0000 mg | ORAL_TABLET | Freq: Once | ORAL | Status: AC
  Administered 2012-09-14: 1 mg via ORAL
  Filled 2012-09-14: qty 1

## 2012-09-14 MED ORDER — LORAZEPAM 1 MG PO TABS: 1.0000 mg | ORAL_TABLET | Freq: Two times a day (BID) | ORAL | Status: DC | PRN

## 2012-09-14 MED ORDER — DIPHENHYDRAMINE HCL 25 MG PO CAPS
25.0000 mg | ORAL_CAPSULE | Freq: Once | ORAL | Status: AC
  Administered 2012-09-14: 25 mg via ORAL
  Filled 2012-09-14: qty 1

## 2012-09-14 MED ORDER — ALBUTEROL SULFATE HFA 108 (90 BASE) MCG/ACT IN AERS
2.0000 | INHALATION_SPRAY | RESPIRATORY_TRACT | Status: DC | PRN
  Administered 2012-09-14: 2 via RESPIRATORY_TRACT
  Filled 2012-09-14: qty 6.7

## 2012-09-14 NOTE — Progress Notes (Signed)
Nurse given mdi albuterol , to scan when pt leaves, taken out of pyxis by me rfl

## 2012-09-14 NOTE — ED Notes (Signed)
Sob for 2 hours, alert, talking, ambulatory.

## 2012-09-14 NOTE — ED Notes (Signed)
Gave patient drink as requested.  

## 2012-09-14 NOTE — ED Notes (Signed)
Patient complaining of itching "all over." States "I am going to stay here all night and wait because I am itching." Advised MD.

## 2012-09-15 NOTE — ED Provider Notes (Signed)
History     CSN: [id number]  Arrival date & time 09/14/12  4098   First MD Initiated Contact with Patient 09/14/12 2009      Chief Complaint  Patient presents with  . Shortness of Breath    (Consider location/radiation/quality/duration/timing/severity/associated sxs/prior treatment) HPI... regular visitor to the emergency department for COPD exacerbation.  Complains of usual wheezing and dyspnea. Nothing makes symptoms better or worse. Severity is mild. No chest pain, fever, chills, rusty sputum  Past Medical History  Diagnosis Date  . COPD (chronic obstructive pulmonary disease)   . Anxiety   . Alcohol abuse   . Anemia   . Generalized headaches   . Hypertension   . 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 Pulmonary nodule/lesion, solitary   . Schizoaffective disorder   . GI bleed     History of recurrent bleeding that dates back as far as 2004 per records,    Past Surgical History  Procedure Date  . Colonoscopy Sept 2009    SLF: few small AVMs in ascending colon distal to IC . Ablated via BICAP.   Marland Kitchen Esophagogastroduodenoscopy Sept 2009    SLF: normal esophagus, small HH, 1-2 AVMs actively oozing in mid stomach, s/p BICAP, path benign   . Esophagogastroduodenoscopy 01/18/2012    Rourk-Erosive reflux esophagitis. Noncritical ring. Antral erosions/ small antral ulcer - appeared innocent  (H pylori serrology negative)  . Colonoscopy 03/09/2012    Procedure: COLONOSCOPY;  Surgeon: West Bali, MD;  Location: [facility];  Service: Endoscopy;  Laterality: N/A;  2:30  . Esophagogastroduodenoscopy 03/09/2012    Procedure: ESOPHAGOGASTRODUODENOSCOPY (EGD);  Surgeon: West Bali, MD;  Location: [facility];  Service: Endoscopy;  Laterality: N/A;    Family History  Problem Relation Age of Onset  . Colon cancer Neg Hx     History  Substance Use Topics  . Smoking status: Former Smoker    Types: Cigarettes   Quit date: 05/07/1969  . Smokeless tobacco: Current User    Types: Snuff  . Alcohol Use: 2.4 oz/week    4 Cans of beer per week     Comment: ETOH-pt states quit 12/2011      Review of Systems  All other systems reviewed and are negative.    Allergies  Black pepper  Home Medications   Current Outpatient Rx  Name  Route  Sig  Dispense  Refill  . ALBUTEROL SULFATE HFA 108 (90 BASE) MCG/ACT IN AERS   Inhalation   Inhale 2 puffs into the lungs every 4 (four) hours as needed for wheezing.   1 Inhaler   3   . AMOXICILLIN-POT CLAVULANATE 875-125 MG PO TABS   Oral   Take 1 tablet by mouth 2 (two) times daily. For 10 days         . COMBIVENT RESPIMAT 20-100 MCG/ACT IN AERS   Inhalation   Inhale 20-100 mcg into the lungs Daily.         Marland Kitchen DIAZEPAM 5 MG PO TABS   Oral   Take 5 mg by mouth 3 (three) times daily.          Marland Kitchen HYDROCODONE-ACETAMINOPHEN 5-325 MG PO TABS   Oral   Take 1 tablet by mouth 4 (four) times daily as needed. Pain.         Marland Kitchen LEVALBUTEROL TARTRATE 45 MCG/ACT IN AERO   Inhalation   Inhale 1-2 puffs  into the lungs 2 (two) times daily. *Uses with an Aerochamber*         . CELECOXIB 200 MG PO CAPS   Oral   Take 200 mg by mouth 2 (two) times daily.         Marland Kitchen LEVOFLOXACIN 500 MG PO TABS   Oral   Take 1 tablet (500 mg total) by mouth daily.   7 tablet   0   . LORAZEPAM 1 MG PO TABS   Oral   Take 1 tablet (1 mg total) by mouth 2 (two) times daily as needed for anxiety.   20 tablet   0   . PREDNISONE 10 MG PO TABS   Oral   Take 2 tablets (20 mg total) by mouth daily.   14 tablet   0   . PREDNISONE 20 MG PO TABS      3 tabs po day one, then 2 po daily x 4 days   11 tablet   0     BP 130/80  Pulse 118  Temp 98.4 F (36.9 C) (Oral)  Resp 22  Ht 5\' 4"  (1.626 m)  Wt 123 lb (55.792 kg)  BMI 21.11 kg/m2  SpO2 97%  Physical Exam  Nursing note and vitals reviewed. Constitutional: He is oriented to person, place, and time. He  appears well-developed and well-nourished.  HENT:  Head: Normocephalic and atraumatic.  Eyes: Conjunctivae normal and EOM are normal. Pupils are equal, round, and reactive to light.  Neck: Normal range of motion. Neck supple.  Cardiovascular: Normal rate, regular rhythm and normal heart sounds.   Pulmonary/Chest: Effort normal.       Minimal expiratory wheeze  Abdominal: Soft. Bowel sounds are normal.  Musculoskeletal: Normal range of motion.  Neurological: He is alert and oriented to person, place, and time.  Skin: Skin is warm and dry.  Psychiatric: He has a normal mood and affect.    ED Course  Procedures (including critical care time)  Labs Reviewed - No data to display No results found.   1. COPD exacerbation       MDM  Patient appears to be his normal self.   Will Rx albuterol/Atrovent breathing treatment.   Refill albuterol MDI.   Prednisone for 4 days.       Donnetta Hutching, MD 09/15/12 [phone]

## 2012-09-17 ENCOUNTER — Encounter (HOSPITAL_COMMUNITY): Payer: Self-pay

## 2012-09-17 ENCOUNTER — Emergency Department (HOSPITAL_COMMUNITY)
Admission: EM | Admit: 2012-09-17 | Discharge: 2012-09-17 | Disposition: A | Discharge status: Home / Self Care | Payer: PRIVATE HEALTH INSURANCE | Attending: Emergency Medicine | Admitting: Emergency Medicine

## 2012-09-17 DIAGNOSIS — Z8709 Personal history of other diseases of the respiratory system: Secondary | ICD-10-CM | POA: Insufficient documentation

## 2012-09-17 DIAGNOSIS — Z87891 Personal history of nicotine dependence: Secondary | ICD-10-CM | POA: Insufficient documentation

## 2012-09-17 DIAGNOSIS — J449 Chronic obstructive pulmonary disease, unspecified: Secondary | ICD-10-CM | POA: Insufficient documentation

## 2012-09-17 DIAGNOSIS — Z760 Encounter for issue of repeat prescription: Secondary | ICD-10-CM | POA: Insufficient documentation

## 2012-09-17 DIAGNOSIS — F209 Schizophrenia, unspecified: Secondary | ICD-10-CM | POA: Insufficient documentation

## 2012-09-17 DIAGNOSIS — Z79899 Other long term (current) drug therapy: Secondary | ICD-10-CM | POA: Insufficient documentation

## 2012-09-17 DIAGNOSIS — F411 Generalized anxiety disorder: Secondary | ICD-10-CM | POA: Insufficient documentation

## 2012-09-17 DIAGNOSIS — Z862 Personal history of diseases of the blood and blood-forming organs and certain disorders involving the immune mechanism: Secondary | ICD-10-CM | POA: Insufficient documentation

## 2012-09-17 DIAGNOSIS — I10 Essential (primary) hypertension: Secondary | ICD-10-CM | POA: Insufficient documentation

## 2012-09-17 DIAGNOSIS — J4489 Other specified chronic obstructive pulmonary disease: Secondary | ICD-10-CM | POA: Insufficient documentation

## 2012-09-17 DIAGNOSIS — M542 Cervicalgia: Secondary | ICD-10-CM | POA: Insufficient documentation

## 2012-09-17 DIAGNOSIS — Z8679 Personal history of other diseases of the circulatory system: Secondary | ICD-10-CM | POA: Insufficient documentation

## 2012-09-17 MED ORDER — ALBUTEROL SULFATE HFA 108 (90 BASE) MCG/ACT IN AERS: 2.0000 | INHALATION_SPRAY | RESPIRATORY_TRACT | Status: DC | PRN

## 2012-09-17 NOTE — ED Provider Notes (Signed)
History   This chart was scribed for Ward Givens, MD by Charolett Bumpers, ER Scribe. The patient was seen in room APA08/[facility]08. Patient's care was started at 0855.   CSN: [id number]  Arrival date & time 09/17/12  1478   First MD Initiated Contact with Patient 09/17/12 [phone]      Chief Complaint  Patient presents with  . Shortness of Breath    HPI Comments: [name] is a 78 y.o. male who presents to the Emergency Department complaining of breathing problems starting this morning. Pt states neck pain, SOB. Pt denies cough, chest pain or fever. This is his 48th visit in 6 months. Normally pt is aggressive and demands inhalers in the Emergency dept during visits.  Patient is a 78 y.o. male presenting with shortness of breath. The history is provided by the patient. No language interpreter was used.  Shortness of Breath  The current episode started today. The onset was gradual. The problem occurs continuously. The problem has been unchanged. The problem is mild. Associated symptoms include shortness of breath. Pertinent negatives include no chest pain, no fever and no cough.   Pt took one of his pain pills for his neck pain.   PCP Dr Juanetta Gosling  Past Medical History  Diagnosis Date  . COPD (chronic obstructive pulmonary disease)   . Anxiety   . Alcohol abuse   . Anemia   . Generalized headaches   . Hypertension   . 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 Pulmonary nodule/lesion, solitary   . Schizoaffective disorder   . GI bleed     History of recurrent bleeding that dates back as far as 2004 per records,    Past Surgical History  Procedure Date  . Colonoscopy Sept 2009    SLF: few small AVMs in ascending colon distal to IC . Ablated via BICAP.   Marland Kitchen Esophagogastroduodenoscopy Sept 2009    SLF: normal esophagus, small HH, 1-2 AVMs actively oozing in mid stomach, s/p BICAP, path benign   . Esophagogastroduodenoscopy  01/18/2012    Rourk-Erosive reflux esophagitis. Noncritical ring. Antral erosions/ small antral ulcer - appeared innocent  (H pylori serrology negative)  . Colonoscopy 03/09/2012    Procedure: COLONOSCOPY;  Surgeon: West Bali, MD;  Location: [facility];  Service: Endoscopy;  Laterality: N/A;  2:30  . Esophagogastroduodenoscopy 03/09/2012    Procedure: ESOPHAGOGASTRODUODENOSCOPY (EGD);  Surgeon: West Bali, MD;  Location: [facility];  Service: Endoscopy;  Laterality: N/A;    Family History  Problem Relation Age of Onset  . Colon cancer Neg Hx     History  Substance Use Topics  . Smoking status: Former Smoker    Types: Cigarettes    Quit date: 05/07/1969  . Smokeless tobacco: Current User    Types: Snuff  . Alcohol Use: No    Pt does not smoke any longer.  He lives with his son.    Review of Systems  Constitutional: Negative for fever.  HENT: Positive for neck pain.   Respiratory: Positive for shortness of breath. Negative for cough.   Cardiovascular: Negative for chest pain.  All other systems reviewed and are negative.    Allergies  Black pepper  Home Medications   Current Outpatient Rx  Name  Route  Sig  Dispense  Refill  . ALBUTEROL SULFATE HFA 108 (90 BASE) MCG/ACT IN AERS   Inhalation  Inhale 2 puffs into the lungs every 4 (four) hours as needed for wheezing.   1 Inhaler   3   . ALBUTEROL SULFATE HFA 108 (90 BASE) MCG/ACT IN AERS   Inhalation   Inhale 2 puffs into the lungs every 4 (four) hours as needed for wheezing.   1 Inhaler   0   . AMOXICILLIN-POT CLAVULANATE 875-125 MG PO TABS   Oral   Take 1 tablet by mouth 2 (two) times daily. For 10 days         . CELECOXIB 200 MG PO CAPS   Oral   Take 200 mg by mouth 2 (two) times daily.         . COMBIVENT RESPIMAT 20-100 MCG/ACT IN AERS   Inhalation   Inhale 20-100 mcg into the lungs Daily.         Marland Kitchen DIAZEPAM 5 MG PO TABS   Oral   Take 5 mg by mouth 3 (three) times daily.           Marland Kitchen HYDROCODONE-ACETAMINOPHEN 5-325 MG PO TABS   Oral   Take 1 tablet by mouth 4 (four) times daily as needed. Pain.         Marland Kitchen LEVALBUTEROL TARTRATE 45 MCG/ACT IN AERO   Inhalation   Inhale 1-2 puffs into the lungs 2 (two) times daily. *Uses with an Aerochamber*         . LEVOFLOXACIN 500 MG PO TABS   Oral   Take 1 tablet (500 mg total) by mouth daily.   7 tablet   0   . LORAZEPAM 1 MG PO TABS   Oral   Take 1 tablet (1 mg total) by mouth 2 (two) times daily as needed for anxiety.   20 tablet   0   . PREDNISONE 10 MG PO TABS   Oral   Take 2 tablets (20 mg total) by mouth daily.   14 tablet   0   . PREDNISONE 20 MG PO TABS      3 tabs po day one, then 2 po daily x 4 days   11 tablet   0     BP 135/75  Pulse 99  Temp 98 F (36.7 C) (Oral)  Resp 24  Wt 123 lb (55.792 kg)  SpO2 90%  Vital signs normal   Pulse ox was 100% on room air during my exam   Physical Exam  Nursing note and vitals reviewed. Constitutional: He is oriented to person, place, and time. He appears well-developed and well-nourished. No distress.  HENT:  Head: Normocephalic and atraumatic.  Right Ear: External ear normal.  Left Ear: External ear normal.  Nose: Nose normal.  Mouth/Throat: Oropharynx is clear and moist.  Eyes: Conjunctivae normal and EOM are normal. Pupils are equal, round, and reactive to light.  Neck: Normal range of motion. Neck supple. No tracheal deviation present.  Cardiovascular: Normal rate, regular rhythm, normal heart sounds and intact distal pulses.  Exam reveals no gallop and no friction rub.   No murmur heard. Pulmonary/Chest: Effort normal. No respiratory distress. He has wheezes.       Faint end respiratory wheezes. No retractions. No respiratory distress  Abdominal: Soft. Bowel sounds are normal. He exhibits no distension. There is no tenderness.  Musculoskeletal: Normal range of motion. He exhibits no edema.  Neurological: He is alert and oriented to  person, place, and time.  Skin: Skin is warm and dry.  Psychiatric: He has a normal mood and affect.  His behavior is normal.    ED Course  Procedures (including critical care time)  Pt did not want a nebulizer treatment while in the ED.  DIAGNOSTIC STUDIES: Oxygen Saturation is 100% on room air, adequate by my interpretation.    COORDINATION OF CARE:  9:15 Discussed planned course of treatment with the patient including prescription for inhaler, who is agreeable at this time.     1. COPD (chronic obstructive pulmonary disease)   2. Medication refill     New Prescriptions   ALBUTEROL (PROVENTIL HFA;VENTOLIN HFA) 108 (90 BASE) MCG/ACT INHALER    Inhale 2 puffs into the lungs every 4 (four) hours as needed for wheezing.    Plan discharge  Devoria Albe, MD, FACEP   MDM    I personally performed the services described in this documentation, which was scribed in my presence. The recorded information has been reviewed and considered.     Ward Givens, MD 09/17/12 [phone]

## 2012-09-17 NOTE — ED Notes (Signed)
Pt c/o SOB since ran out of inhaler yesterday.  Denies pain.  Says was itching when he woke up this morning but he took a pill and it stopped.

## 2012-09-18 ENCOUNTER — Inpatient Hospital Stay (HOSPITAL_COMMUNITY)
Admission: EM | Admit: 2012-09-18 | Discharge: 2012-09-19 | DRG: 300 | Disposition: A | Discharge status: Home / Self Care | Payer: PRIVATE HEALTH INSURANCE | Attending: Pulmonary Disease | Admitting: Pulmonary Disease

## 2012-09-18 ENCOUNTER — Telehealth (INDEPENDENT_AMBULATORY_CARE_PROVIDER_SITE_OTHER): Payer: Self-pay | Admitting: Internal Medicine

## 2012-09-18 ENCOUNTER — Emergency Department (HOSPITAL_COMMUNITY): Payer: PRIVATE HEALTH INSURANCE

## 2012-09-18 ENCOUNTER — Encounter (HOSPITAL_COMMUNITY): Payer: Self-pay | Admitting: *Deleted

## 2012-09-18 DIAGNOSIS — F0390 Unspecified dementia without behavioral disturbance: Secondary | ICD-10-CM | POA: Diagnosis present

## 2012-09-18 DIAGNOSIS — I2510 Atherosclerotic heart disease of native coronary artery without angina pectoris: Secondary | ICD-10-CM | POA: Diagnosis present

## 2012-09-18 DIAGNOSIS — R079 Chest pain, unspecified: Secondary | ICD-10-CM

## 2012-09-18 DIAGNOSIS — J441 Chronic obstructive pulmonary disease with (acute) exacerbation: Secondary | ICD-10-CM

## 2012-09-18 DIAGNOSIS — K922 Gastrointestinal hemorrhage, unspecified: Secondary | ICD-10-CM

## 2012-09-18 DIAGNOSIS — K5520 Angiodysplasia of colon without hemorrhage: Secondary | ICD-10-CM

## 2012-09-18 DIAGNOSIS — I10 Essential (primary) hypertension: Secondary | ICD-10-CM | POA: Diagnosis present

## 2012-09-18 DIAGNOSIS — J4489 Other specified chronic obstructive pulmonary disease: Secondary | ICD-10-CM | POA: Diagnosis present

## 2012-09-18 DIAGNOSIS — Z79899 Other long term (current) drug therapy: Secondary | ICD-10-CM

## 2012-09-18 DIAGNOSIS — F209 Schizophrenia, unspecified: Secondary | ICD-10-CM | POA: Diagnosis present

## 2012-09-18 DIAGNOSIS — F411 Generalized anxiety disorder: Secondary | ICD-10-CM | POA: Diagnosis present

## 2012-09-18 DIAGNOSIS — Z87891 Personal history of nicotine dependence: Secondary | ICD-10-CM

## 2012-09-18 DIAGNOSIS — D649 Anemia, unspecified: Secondary | ICD-10-CM

## 2012-09-18 DIAGNOSIS — D500 Iron deficiency anemia secondary to blood loss (chronic): Secondary | ICD-10-CM | POA: Diagnosis present

## 2012-09-18 DIAGNOSIS — F1020 Alcohol dependence, uncomplicated: Secondary | ICD-10-CM | POA: Diagnosis present

## 2012-09-18 DIAGNOSIS — R911 Solitary pulmonary nodule: Secondary | ICD-10-CM | POA: Diagnosis present

## 2012-09-18 DIAGNOSIS — R51 Headache: Secondary | ICD-10-CM | POA: Diagnosis present

## 2012-09-18 DIAGNOSIS — Q2733 Arteriovenous malformation of digestive system vessel: Principal | ICD-10-CM

## 2012-09-18 DIAGNOSIS — J449 Chronic obstructive pulmonary disease, unspecified: Secondary | ICD-10-CM

## 2012-09-18 DIAGNOSIS — F259 Schizoaffective disorder, unspecified: Secondary | ICD-10-CM | POA: Diagnosis present

## 2012-09-18 DIAGNOSIS — IMO0002 Reserved for concepts with insufficient information to code with codable children: Secondary | ICD-10-CM

## 2012-09-18 DIAGNOSIS — Z23 Encounter for immunization: Secondary | ICD-10-CM

## 2012-09-18 DIAGNOSIS — I214 Non-ST elevation (NSTEMI) myocardial infarction: Secondary | ICD-10-CM | POA: Diagnosis present

## 2012-09-18 DIAGNOSIS — I200 Unstable angina: Secondary | ICD-10-CM | POA: Diagnosis present

## 2012-09-18 LAB — BASIC METABOLIC PANEL
BUN: 20 mg/dL (ref 6–23)
Calcium: 9.4 mg/dL (ref 8.4–10.5)
Chloride: 100 mEq/L (ref 96–112)
GFR calc Af Amer: >90 mL/min (ref >90)
GFR calc Af Amer: >90 mL/min (ref >90)
GFR calc non Af Amer: 78 mL/min — ABNORMAL LOW (ref >90)
Glucose, Bld: 105 mg/dL — ABNORMAL HIGH (ref 70–99)
Glucose, Bld: 90 mg/dL (ref 70–99)
Potassium: 3.3 mEq/L — ABNORMAL LOW (ref 3.5–5.1)
Sodium: 137 mEq/L (ref 135–145)

## 2012-09-18 LAB — TROPONIN I: Troponin I: <0.3 ng/mL (ref <0.30)

## 2012-09-18 LAB — CBC WITH DIFFERENTIAL/PLATELET
Eosinophils Absolute: 0 10*3/uL (ref 0.0–0.7)
Eosinophils Relative: 0 % (ref 0–5)
Lymphocytes Relative: 6 % — ABNORMAL LOW (ref 12–46)
MCH: 22.2 pg — ABNORMAL LOW (ref 26.0–34.0)
MCHC: 29.9 g/dL — ABNORMAL LOW (ref 30.0–36.0)
Monocytes Absolute: 1.2 10*3/uL — ABNORMAL HIGH (ref 0.1–1.0)
Neutrophils Relative %: 77 % (ref 43–77)
Platelets: 393 10*3/uL (ref 150–400)
RBC: 3.29 MIL/uL — ABNORMAL LOW (ref 4.22–5.81)

## 2012-09-18 LAB — CBC
HCT: 22.9 % — ABNORMAL LOW (ref 39.0–52.0)
MCH: 21.9 pg — ABNORMAL LOW (ref 26.0–34.0)
MCHC: 29.3 g/dL — ABNORMAL LOW (ref 30.0–36.0)
MCV: 74.8 fL — ABNORMAL LOW (ref 78.0–100.0)
RDW: 21.7 % — ABNORMAL HIGH (ref 11.5–15.5)

## 2012-09-18 LAB — HEPATIC FUNCTION PANEL
ALT: 13 U/L (ref 0–53)
Albumin: 2.8 g/dL — ABNORMAL LOW (ref 3.5–5.2)
Alkaline Phosphatase: 40 U/L (ref 39–117)
Total Protein: 5 g/dL — ABNORMAL LOW (ref 6.0–8.3)

## 2012-09-18 LAB — MAGNESIUM: Magnesium: 2.2 mg/dL (ref 1.5–2.5)

## 2012-09-18 LAB — LIPID PANEL: LDL Cholesterol: 60 mg/dL (ref 0–99)

## 2012-09-18 MED ORDER — NITROGLYCERIN 0.4 MG SL SUBL: 0.4000 mg | SUBLINGUAL_TABLET | SUBLINGUAL | Status: DC | PRN

## 2012-09-18 MED ORDER — METOPROLOL TARTRATE 1 MG/ML IV SOLN
5.0000 mg | Freq: Four times a day (QID) | INTRAVENOUS | Status: DC
  Administered 2012-09-18 (×2): 5 mg via INTRAVENOUS
  Filled 2012-09-18 (×2): qty 5

## 2012-09-18 MED ORDER — BISACODYL 10 MG RE SUPP: 10.0000 mg | Freq: Every day | RECTAL | Status: DC | PRN

## 2012-09-18 MED ORDER — SODIUM CHLORIDE 0.9 % IV SOLN
Freq: Once | INTRAVENOUS | Status: AC
  Administered 2012-09-18: 01:00:00 via INTRAVENOUS

## 2012-09-18 MED ORDER — ALBUTEROL SULFATE (5 MG/ML) 0.5% IN NEBU
2.5000 mg | INHALATION_SOLUTION | Freq: Four times a day (QID) | RESPIRATORY_TRACT | Status: DC | PRN
  Administered 2012-09-18: 2.5 mg via RESPIRATORY_TRACT
  Filled 2012-09-18: qty 0.5

## 2012-09-18 MED ORDER — IPRATROPIUM BROMIDE 0.02 % IN SOLN
0.5000 mg | Freq: Four times a day (QID) | RESPIRATORY_TRACT | Status: DC | PRN
  Administered 2012-09-18: 0.5 mg via RESPIRATORY_TRACT
  Filled 2012-09-18: qty 2.5

## 2012-09-18 MED ORDER — FLEET ENEMA 7-19 GM/118ML RE ENEM: 1.0000 | ENEMA | Freq: Once | RECTAL | Status: AC | PRN

## 2012-09-18 MED ORDER — SODIUM CHLORIDE 0.9 % IJ SOLN
3.0000 mL | Freq: Two times a day (BID) | INTRAMUSCULAR | Status: DC
  Administered 2012-09-18: 3 mL via INTRAVENOUS
  Administered 2012-09-18: 10 mL via INTRAVENOUS

## 2012-09-18 MED ORDER — HYDROCODONE-ACETAMINOPHEN 5-325 MG PO TABS
1.0000 | ORAL_TABLET | ORAL | Status: DC | PRN
  Filled 2012-09-18: qty 1

## 2012-09-18 MED ORDER — INFLUENZA VIRUS VACC SPLIT PF IM SUSP
0.5000 mL | INTRAMUSCULAR | Status: AC
  Administered 2012-09-19: 0.5 mL via INTRAMUSCULAR
  Filled 2012-09-18: qty 0.5

## 2012-09-18 MED ORDER — DIPHENHYDRAMINE HCL 50 MG/ML IJ SOLN
25.0000 mg | Freq: Once | INTRAMUSCULAR | Status: AC
  Administered 2012-09-18: 25 mg via INTRAVENOUS
  Filled 2012-09-18: qty 1

## 2012-09-18 MED ORDER — ONDANSETRON HCL 4 MG PO TABS: 4.0000 mg | ORAL_TABLET | Freq: Four times a day (QID) | ORAL | Status: DC | PRN

## 2012-09-18 MED ORDER — SIMVASTATIN 20 MG PO TABS
20.0000 mg | ORAL_TABLET | Freq: Every day | ORAL | Status: DC
  Administered 2012-09-18: 20 mg via ORAL
  Filled 2012-09-18: qty 1

## 2012-09-18 MED ORDER — POTASSIUM CHLORIDE IN NACL 20-0.9 MEQ/L-% IV SOLN
INTRAVENOUS | Status: DC
  Administered 2012-09-18: 06:00:00 via INTRAVENOUS

## 2012-09-18 MED ORDER — PANTOPRAZOLE SODIUM 40 MG IV SOLR
40.0000 mg | Freq: Two times a day (BID) | INTRAVENOUS | Status: DC
  Administered 2012-09-18 (×2): 40 mg via INTRAVENOUS
  Filled 2012-09-18 (×3): qty 40

## 2012-09-18 MED ORDER — ONDANSETRON HCL 4 MG/2ML IJ SOLN: 4.0000 mg | Freq: Four times a day (QID) | INTRAMUSCULAR | Status: DC | PRN

## 2012-09-18 NOTE — Evaluation (Signed)
Physical Therapy Evaluation Patient Details Name: [name] MRN: [id number] DOB: [birth year] Today's Date: 09/18/2012 Time: 9562-1308 PT Time Calculation (min): 19 min  PT Assessment / Plan / Recommendation Clinical Impression  An eval was initiated this AM on this pt who states that he is normally independent at home with no assistive devices.  He was up most of the night in ED and is fairly tired now.  He did participate in t MMT and strength is WNL.  He refused to get OOB due to fatigue.  I am anticipating that he will not have any difficulty with mobility, but will try to retrun today in order to document this.    PT Assessment  Patient needs continued PT services    Follow Up Recommendations   (to be determined)    Does the patient have the potential to tolerate intense rehabilitation      Barriers to Discharge None      Equipment Recommendations   (to be determined)    Recommendations for Other Services     Frequency      Precautions / Restrictions Precautions Precautions: Fall Restrictions Weight Bearing Restrictions: No   Pertinent Vitals/Pain       Mobility  Bed Mobility Bed Mobility: Not assessed Details for Bed Mobility Assistance: pt states that he wants to sleep...doesn't want to get OOB    Shoulder Instructions     Exercises     PT Diagnosis:    PT Problem List:  (to be determined) PT Treatment Interventions:  (to be determined)   PT Goals    Visit Information  Last PT Received On: 09/18/12    Subjective Data  Subjective: I was up all night long Patient Stated Goal: return home   Prior Functioning  Home Living Lives With: Son Available Help at Discharge: Family;Available PRN/intermittently Type of Home: House Home Access: Stairs to enter [employer] of Steps: 3 Entrance Stairs-Rails: Right Home Layout: One level Bathroom Toilet: Standard Home Adaptive Equipment: None Prior Function Level of Independence:  Independent Able to Take Stairs?: Yes Vocation: Retired Musician: No difficulties    Cognition  Overall Cognitive Status: Appears within functional limits for tasks assessed/performed Arousal/Alertness: Lethargic Orientation Level: Appears intact for tasks assessed Behavior During Session: Lethargic    Extremity/Trunk Assessment Right Lower Extremity Assessment RLE ROM/Strength/Tone: Within functional levels RLE Sensation: WFL - Light Touch Left Lower Extremity Assessment LLE ROM/Strength/Tone: Within functional levels LLE Sensation: WFL - Light Touch Trunk Assessment Trunk Assessment: Normal   Balance    End of Session PT - End of Session Activity Tolerance: Patient limited by fatigue Patient left: in bed;with call bell/phone within reach;with bed alarm set  GP     Konrad Penta 09/18/2012, 12:36 PM

## 2012-09-18 NOTE — Progress Notes (Signed)
PT Cancellation Note  Patient Details Name: [name] MRN: [id number] DOB: Jun 20, 1930   Cancelled Treatment:    Reason Eval/Treat Not Completed: Fatigue/lethargy limiting ability to participate (I got to be feeling a lot better before I walk)   Myrlene Broker L 09/18/2012, 1:04 PM

## 2012-09-18 NOTE — ED Notes (Signed)
Pt reporting improvement in itching.  Somewhat drowsy at this time.

## 2012-09-18 NOTE — Telephone Encounter (Signed)
I spoke with Dr. Karilyn Cota concerning this patient.  I read Anna's note in Epic to him. Dr. Karilyn Cota is not on-call till 5pm. Our practice has never seen this patient.  He advised me that [facility] will see this patient. I called the secretary (or representative) and relayed the message, to call on-call GI.  Dr. Marline Backbone is not in town at this town and is expected back around 5pm today.

## 2012-09-18 NOTE — Progress Notes (Signed)
Pt complaining of "tightness in his breathing". Checked pulse ox and it was 100% on 2L of oxygen. Pt felt uncomfortable so RT has been called to administer albuterol via nebulizer.

## 2012-09-18 NOTE — H&P (Signed)
[facility] History and Physical  Jeffie Spivack  RUE:[id number]  DOB: [birth year]   DOA: 09/18/2012   PCP:   Fredirick Maudlin, MD   Chief Complaint:  Chest pain this evening  HPI: [name] is an 78 y.o. male.   Elderly African American gentleman well-known to this hospital, history of COPD schizoaffective disorder multiple visits to the emergency room for shortness of breath. He was admitted about 8 weeks ago for GI bleed presumed to be due to an AV malformation, and found to have non-ST elevation MI , presumably type II secondary to anemia. He was not at that time felt to be a candidate for cardiac intervention.  He not returns this evening complaining of 30 minutes of left-sided chest pain which has now completely resolved, but because of his past history and the hospitalist service was called to assist with admission. The patient also complained of some itching and received intravenous Benadryl, and is now unable to give any meaningful history due to drowsiness and confusion.   There is no history of dizziness or diaphoresis associated with chest pain, there is no history of. He says he drinks one to 2 beers per day but it's unclear how reliable this history is. He denies any bloody or black stool.  Rewiew of Systems:  Unable to obtain because of patient's confusion    Past Medical History  Diagnosis Date  . COPD (chronic obstructive pulmonary disease)   . Anxiety   . Alcohol abuse   . Anemia   . Generalized headaches   . Hypertension   . 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 Pulmonary nodule/lesion, solitary   . Schizoaffective disorder   . GI bleed     History of recurrent bleeding that dates back as far as 2004 per records,    Past Surgical History  Procedure Date  . Colonoscopy Sept 2009    SLF: few small AVMs in ascending colon distal to IC . Ablated via BICAP.   Marland Kitchen Esophagogastroduodenoscopy Sept  2009    SLF: normal esophagus, small HH, 1-2 AVMs actively oozing in mid stomach, s/p BICAP, path benign   . Esophagogastroduodenoscopy 01/18/2012    Rourk-Erosive reflux esophagitis. Noncritical ring. Antral erosions/ small antral ulcer - appeared innocent  (H pylori serrology negative)  . Colonoscopy 03/09/2012    Procedure: COLONOSCOPY;  Surgeon: West Bali, MD;  Location: [facility];  Service: Endoscopy;  Laterality: N/A;  2:30  . Esophagogastroduodenoscopy 03/09/2012    Procedure: ESOPHAGOGASTRODUODENOSCOPY (EGD);  Surgeon: West Bali, MD;  Location: [facility];  Service: Endoscopy;  Laterality: N/A;    Medications:  HOME MEDS: Prior to Admission medications   Medication Sig Start Date End Date Taking? Authorizing Provider  albuterol (PROVENTIL HFA;VENTOLIN HFA) 108 (90 BASE) MCG/ACT inhaler Inhale 2 puffs into the lungs every 4 (four) hours as needed for wheezing. 08/05/12  Yes Suzi Roots, MD  albuterol (PROVENTIL HFA;VENTOLIN HFA) 108 (90 BASE) MCG/ACT inhaler Inhale 2 puffs into the lungs every 4 (four) hours as needed for wheezing. 09/17/12  Yes Ward Givens, MD  amoxicillin-clavulanate (AUGMENTIN) 875-125 MG per tablet Take 1 tablet by mouth 2 (two) times daily. For 10 days 09/14/12  Yes Historical Provider, MD  celecoxib (CELEBREX) 200 MG capsule Take 200 mg by mouth 2 (two) times daily.   Yes Historical Provider, MD  COMBIVENT RESPIMAT 20-100 MCG/ACT AERS respimat Inhale 20-100  mcg into the lungs Daily. 09/04/12  Yes Historical Provider, MD  diazepam (VALIUM) 5 MG tablet Take 5 mg by mouth 3 (three) times daily.    Yes Historical Provider, MD  HYDROcodone-acetaminophen (NORCO/VICODIN) 5-325 MG per tablet Take 1 tablet by mouth 4 (four) times daily as needed. Pain.   Yes Historical Provider, MD  levalbuterol (XOPENEX HFA) 45 MCG/ACT inhaler Inhale 1-2 puffs into the lungs 2 (two) times daily. *Uses with an Aerochamber*   Yes Historical Provider, MD  levofloxacin  (LEVAQUIN) 500 MG tablet Take 1 tablet (500 mg total) by mouth daily. 09/13/12  Yes Carleene Cooper III, MD  LORazepam (ATIVAN) 1 MG tablet Take 1 tablet (1 mg total) by mouth 2 (two) times daily as needed for anxiety. 09/14/12  Yes Donnetta Hutching, MD  predniSONE (DELTASONE) 10 MG tablet Take 2 tablets (20 mg total) by mouth daily. 09/13/12  Yes Carleene Cooper III, MD  predniSONE (DELTASONE) 20 MG tablet 3 tabs po day one, then 2 po daily x 4 days 09/14/12  Yes Donnetta Hutching, MD     Allergies:  Allergies  Allergen Reactions  . Black Pepper (Piper Nigrum) Itching    Social History:   reports that he quit smoking about 43 years ago. His smoking use included Cigarettes. His smokeless tobacco use includes Snuff. He reports that he does not drink alcohol or use illicit drugs.  Family History: Family History  Problem Relation Age of Onset  . Colon cancer Neg Hx    denies any family history of heart disease   Physical Exam: Filed Vitals:   09/18/12 0047 09/18/12 0059 09/18/12 0245  BP: 128/70  123/69  Pulse: 102  81  Temp: 97.9 F (36.6 C)    TempSrc: Oral    Resp: 22  16  Height: 5\' 5"  (1.651 m)    Weight: 55.792 kg (123 lb)    SpO2: 84% 98% 96%   Blood pressure 123/69, pulse 81, temperature 97.9 F (36.6 C), temperature source Oral, resp. rate 16, height 5\' 5"  (1.651 m), weight 55.792 kg (123 lb), SpO2 96.00%.  GEN:t confused elderly African American gentleman lying in the stretcher; falls asleep reading repeatedly during the interview and exam;  PSYCH:   does not appear anxious or depressed; affect is appropriate. HEENT: Mucous membranes pale, dry and anicteric; PERRLA; EOM intact; no cervical lymphadenopathy nor thyromegaly or carotid bruit; no JVD; Breasts:: Not examined CHEST WALL: No tenderness CHEST: Normal respiration, clear to auscultation bilaterally HEART: Regular rate and rhythm; no murmurs rubs or gallops BACK: No kyphosis or scoliosis; no CVA tenderness ABDOMEN:  soft  non-tender; no masses, no organomegaly, normal abdominal bowel sounds; ; no intertriginous candida. Rectal Exam: Not done EXTREMITIES:  age-appropriate arthropathy of the hands and knees; no edema; no ulcerations. Genitalia: not examined PULSES: 2+ and symmetric SKIN: Normal hydration no rash or ulceration CNS: Cranial nerves 2-12 grossly intact no focal lateralizing neurologic deficit   Labs on Admission:  Basic Metabolic Panel:  Lab 09/18/12 1308  NA 137  K 3.3*  CL 95*  CO2 34*  GLUCOSE 105*  BUN 22  CREATININE 0.90  CALCIUM 9.4  MG --  PHOS --   Liver Function Tests: No results found for this basename: AST:5,ALT:5,ALKPHOS:5,BILITOT:5,PROT:5,ALBUMIN:5 in the last 168 hours No results found for this basename: LIPASE:5,AMYLASE:5 in the last 168 hours No results found for this basename: AMMONIA:5 in the last 168 hours CBC:  Lab 09/18/12 0109  WBC 7.2  NEUTROABS 5.6  HGB 7.3*  HCT 24.4*  MCV 74.2*  PLT 393   Cardiac Enzymes:  Lab 09/18/12 0109  CKTOTAL --  CKMB --  CKMBINDEX --  TROPONINI <0.30   BNP: No components found with this basename: POCBNP:5 D-dimer: No components found with this basename: D-DIMER:5 CBG: No results found for this basename: GLUCAP:5 in the last 168 hours  Radiological Exams on Admission: Dg Chest Port 1 View  09/18/2012  *RADIOLOGY REPORT*  Clinical Data: Left chest pain, history COPD, smoking, hypertension  PORTABLE CHEST - 1 VIEW  Comparison: Portable exam 0110 hours compared to 09/06/2012  Findings: Normal heart size, mediastinal contours, and pulmonary vascularity. Emphysematous changes without infiltrate, pleural effusion or pneumothorax. Minimal peribronchial thickening. Bones demineralized.  IMPRESSION: Changes of COPD. No acute abnormalities.   Original Report Authenticated By: Ulyses Southward, M.D.     EKG: Independently reviewed. Normal sinus rhythm; no ST segment abnormalities  Assessment/Plan Present on Admission:  . Severe  Anemia due to chronic blood loss Unstable angina, probably due to severe anemia, possibly aggravated by coronary artery disease  . Alcoholism, chronic  COPD (chronic obstructive pulmonary disease) . Schizophrenia/schizoaffective disorder   PLAN: Admit this gentleman for treatment of his cardiac problems and his anemia. We'll transfuse 2 units of packed red cells, and cyclist cardiac enzyme. He may need treatment to her definitively treat his GI problems, and will likely need medical management of his cardiac problems until that time.  Will not give aspirin or other blood thinner at this time because of the probability of bleed. Will give beta blockers and when necessary nitrates; continue statin.  Will discontinue NSAIDS, and steroids, which may be precipitating or aggravating chronic bleeding, and give proton pump inhibitor coverage.  When necessary nebulizer coverage. Will not start EtOH withdrawal protocol at this time.  His primary care physician will assume care later this morning, and I will defer to him to call consults as needed  Other plans as per orders.  Code Status:FULL CODE  Disposition Plan: Discharged home when stable and long-term plan of care defined, though this may be challenging because this gentleman is known to be extremely noncompliant.    Arick Mareno Nocturnist [facility] Pager [phone]   09/18/2012, 3:06 AM

## 2012-09-18 NOTE — Progress Notes (Signed)
Received inpatient consult for patient. However, pt was discharged from our practice at his request in may 2013. Spoke with Dr. Darrick Penna to update about consult on patient. As patient is no longer seen by our practice, will need to establish care with Dr. Karilyn Cota. Contacted [facility] and requested Dr. Karilyn Cota be consulted.

## 2012-09-18 NOTE — ED Provider Notes (Signed)
History     CSN: [id number]  Arrival date & time 09/18/12  4098   First MD Initiated Contact with Patient 09/18/12 0050      Chief Complaint  Patient presents with  . Chest Pain  . Back Pain  . Leg Pain  . Shortness of Breath    (Consider location/radiation/quality/duration/timing/severity/associated sxs/prior treatment) HPI Comments: The patient is an 78 year old male with a long history of COPD who is well-known to this examiner as well as to this emergency department for frequent visits related to his obstructive pulmonary disease. He presents to the emergency department this evening with a complaint of left-sided chest pain as well as shortness of breath and right leg pain. He states that while he was at home watching television this evening he developed acute onset of left-sided chest pain which is poorly described in character but was persistent for 20-30 minutes before resolving. At the same time he developed a very minimal right leg pain which has completely resolved and was very transient. During this time he was short of breath, of note the patient is always short of breath. He denies any fevers, coughs, swelling, rashes, back pain but does admit to having some radiation of the pain to his left shoulder. Review of the medical record shows that the patient was admitted to the hospital in September of this year during which time he had a lower GI bleed thought to be related to an AV malformation. During the admission his troponin became elevated and it was thought that he had a non-ST elevation MI. The etiology of this was questionable demand versus obstructive. No further evaluation was done during the admission secondary to the patient's lack of being able to be anticoagulated and no cardiac catheterization was recommended at that time.  The patient's chest pain has completely resolved at this time he has no symptoms. He usually does not have chest pain so lasting 30 minutes was very  unusual for him.  The history is provided by the patient and medical records.    Past Medical History  Diagnosis Date  . COPD (chronic obstructive pulmonary disease)   . Anxiety   . Alcohol abuse   . Anemia   . Generalized headaches   . Hypertension   . 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 Pulmonary nodule/lesion, solitary   . Schizoaffective disorder   . GI bleed     History of recurrent bleeding that dates back as far as 2004 per records,    Past Surgical History  Procedure Date  . Colonoscopy Sept 2009    SLF: few small AVMs in ascending colon distal to IC . Ablated via BICAP.   Marland Kitchen Esophagogastroduodenoscopy Sept 2009    SLF: normal esophagus, small HH, 1-2 AVMs actively oozing in mid stomach, s/p BICAP, path benign   . Esophagogastroduodenoscopy 01/18/2012    Rourk-Erosive reflux esophagitis. Noncritical ring. Antral erosions/ small antral ulcer - appeared innocent  (H pylori serrology negative)  . Colonoscopy 03/09/2012    Procedure: COLONOSCOPY;  Surgeon: West Bali, MD;  Location: [facility];  Service: Endoscopy;  Laterality: N/A;  2:30  . Esophagogastroduodenoscopy 03/09/2012    Procedure: ESOPHAGOGASTRODUODENOSCOPY (EGD);  Surgeon: West Bali, MD;  Location: [facility];  Service: Endoscopy;  Laterality: N/A;    Family History  Problem Relation Age of Onset  . Colon cancer Neg Hx  History  Substance Use Topics  . Smoking status: Former Smoker    Types: Cigarettes    Quit date: 05/07/1969  . Smokeless tobacco: Current User    Types: Snuff  . Alcohol Use: No      Review of Systems  All other systems reviewed and are negative.    Allergies  Black pepper  Home Medications   Current Outpatient Rx  Name  Route  Sig  Dispense  Refill  . ALBUTEROL SULFATE HFA 108 (90 BASE) MCG/ACT IN AERS   Inhalation   Inhale 2 puffs into the lungs every 4 (four) hours as needed for wheezing.   1  Inhaler   3   . ALBUTEROL SULFATE HFA 108 (90 BASE) MCG/ACT IN AERS   Inhalation   Inhale 2 puffs into the lungs every 4 (four) hours as needed for wheezing.   1 Inhaler   0   . AMOXICILLIN-POT CLAVULANATE 875-125 MG PO TABS   Oral   Take 1 tablet by mouth 2 (two) times daily. For 10 days         . CELECOXIB 200 MG PO CAPS   Oral   Take 200 mg by mouth 2 (two) times daily.         . COMBIVENT RESPIMAT 20-100 MCG/ACT IN AERS   Inhalation   Inhale 20-100 mcg into the lungs Daily.         Marland Kitchen DIAZEPAM 5 MG PO TABS   Oral   Take 5 mg by mouth 3 (three) times daily.          Marland Kitchen HYDROCODONE-ACETAMINOPHEN 5-325 MG PO TABS   Oral   Take 1 tablet by mouth 4 (four) times daily as needed. Pain.         Marland Kitchen LEVALBUTEROL TARTRATE 45 MCG/ACT IN AERO   Inhalation   Inhale 1-2 puffs into the lungs 2 (two) times daily. *Uses with an Aerochamber*         . LEVOFLOXACIN 500 MG PO TABS   Oral   Take 1 tablet (500 mg total) by mouth daily.   7 tablet   0   . LORAZEPAM 1 MG PO TABS   Oral   Take 1 tablet (1 mg total) by mouth 2 (two) times daily as needed for anxiety.   20 tablet   0   . PREDNISONE 10 MG PO TABS   Oral   Take 2 tablets (20 mg total) by mouth daily.   14 tablet   0   . PREDNISONE 20 MG PO TABS      3 tabs po day one, then 2 po daily x 4 days   11 tablet   0     BP 128/70  Pulse 102  Temp 97.9 F (36.6 C) (Oral)  Resp 22  Ht 5\' 5"  (1.651 m)  Wt 123 lb (55.792 kg)  BMI 20.47 kg/m2  SpO2 98%  Physical Exam  Nursing note and vitals reviewed. Constitutional: He appears well-developed and well-nourished. No distress.  HENT:  Head: Normocephalic and atraumatic.  Mouth/Throat: Oropharynx is clear and moist. No oropharyngeal exudate.  Eyes: Conjunctivae normal and EOM are normal. Pupils are equal, round, and reactive to light. Right eye exhibits no discharge. Left eye exhibits no discharge. No scleral icterus.  Neck: Normal range of motion. Neck  supple. No JVD present. No thyromegaly present.  Cardiovascular: Normal rate, regular rhythm, normal heart sounds and intact distal pulses.  Exam reveals no gallop and no friction rub.  No murmur heard.      Strong pulses at the radial arteries, no JVD  Pulmonary/Chest: Effort normal. No respiratory distress. He has wheezes (end expiratory wheezing). He has no rales.       Speaks in full sentences, no increased work of breathing, no accessory muscle use  Abdominal: Soft. Bowel sounds are normal. He exhibits no distension and no mass. There is no tenderness.  Musculoskeletal: Normal range of motion. He exhibits no edema and no tenderness.  Lymphadenopathy:    He has no cervical adenopathy.  Neurological: He is alert. Coordination normal.  Skin: Skin is warm and dry. No rash noted. No erythema.  Psychiatric: He has a normal mood and affect. His behavior is normal.    ED Course  Procedures (including critical care time)  Labs Reviewed  BASIC METABOLIC PANEL - Abnormal; Notable for the following:    Potassium 3.3 (*)     Chloride 95 (*)     CO2 34 (*)     Glucose, Bld 105 (*)     GFR calc non Af Amer 78 (*)     All other components within normal limits  CBC WITH DIFFERENTIAL - Abnormal; Notable for the following:    RBC 3.29 (*)     Hemoglobin 7.3 (*)     HCT 24.4 (*)     MCV 74.2 (*)     MCH 22.2 (*)     MCHC 29.9 (*)     RDW 21.9 (*)     Lymphocytes Relative 6 (*)     Monocytes Relative 17 (*)     Lymphs Abs 0.4 (*)     Monocytes Absolute 1.2 (*)     All other components within normal limits  APTT  PROTIME-INR  TROPONIN I   Dg Chest Port 1 View  09/18/2012  *RADIOLOGY REPORT*  Clinical Data: Left chest pain, history COPD, smoking, hypertension  PORTABLE CHEST - 1 VIEW  Comparison: Portable exam 0110 hours compared to 09/06/2012  Findings: Normal heart size, mediastinal contours, and pulmonary vascularity. Emphysematous changes without infiltrate, pleural effusion or  pneumothorax. Minimal peribronchial thickening. Bones demineralized.  IMPRESSION: Changes of COPD. No acute abnormalities.   Original Report Authenticated By: Ulyses Southward, M.D.      1. Chest pain   2. COPD (chronic obstructive pulmonary disease)   3. Anemia       MDM  At this time the patient has an essentially normal EKG with no Q waves, no ST elevation or depression. His symptoms are concerning in light of his recent non-ST elevation MI. Will obtain lab work, chest x-ray and reevaluate. Again the patient was chest pain-free at the time of evaluation. Aspirin will be held in light of recent GI bleeding and unknown hematologic status.  ED ECG REPORT  I personally interpreted this EKG   Date: 09/18/2012   Rate: 89  Rhythm: normal sinus rhythm  QRS Axis: normal  Intervals: normal  ST/T Wave abnormalities: normal  Conduction Disutrbances:none  Narrative Interpretation:   Old EKG Reviewed: Compared with 08/11/2012, no significant changes are seen   Labs show normal troponin, significant anemia which is new from prior labs and more drastic compared to last admission in September. Care was discussed with the hospitalist Dr. Orvan Falconer who will admit the patient.     Vida Roller, MD 09/18/12 Earle Gell

## 2012-09-18 NOTE — Evaluation (Addendum)
Occupational Therapy Evaluation Patient Details Name: [name] MRN: [id number] DOB: 03-Oct-1930 Today's Date: 09/18/2012 Time: 4098-1191 OT Time Calculation (min): 15 min  OT Assessment / Plan / Recommendation Clinical Impression  Patient is a 78 y/o male s/p Av Malformation of Gastrointestinal Tract presenting to acute OT with deficits below. Patient did not want to get out of bed at eval stating that he is too tired. Strength wise, patient is at baseline. Will assess ADL performance once patient has rested and agreeable to participate.    OT Assessment  Patient needs continued OT Services    Follow Up Recommendations   (To Be Determined)    Barriers to Discharge Decreased caregiver support Unsure if family is available 24 hours a day.  Equipment Recommendations  Other (comment) (TBD)       Frequency  Min 2X/week    Precautions / Restrictions Precautions Precautions: Fall   Pertinent Vitals/Pain No complaints.    ADL       OT Diagnosis: Generalized weakness  OT Problem List: Decreased activity tolerance;Impaired balance (sitting and/or standing);Decreased safety awareness;Decreased knowledge of use of DME or AE OT Treatment Interventions: Self-care/ADL training;Energy conservation;Therapeutic activities;Therapeutic exercise;DME and/or AE instruction;Balance training;Patient/family education   OT Goals Acute Rehab OT Goals OT Goal Formulation: With patient Time For Goal Achievement: 09/25/12 Potential to Achieve Goals: Good ADL Goals Pt Will Perform Grooming: Independently;Standing at sink ADL Goal: Grooming - Progress: Goal set today Pt Will Perform Upper Body Bathing: Independently;Sitting, edge of bed ADL Goal: Upper Body Bathing - Progress: Goal set today Pt Will Perform Lower Body Bathing: with modified independence;Sit to stand from bed ADL Goal: Lower Body Bathing - Progress: Goal set today Pt Will Perform Upper Body Dressing: Independently;Sitting,  bed ADL Goal: Upper Body Dressing - Progress: Goal set today Pt Will Perform Lower Body Dressing: with modified independence;Sit to stand from bed ADL Goal: Lower Body Dressing - Progress: Goal set today Pt Will Transfer to Toilet: with modified independence;Stand pivot transfer;Ambulation;Regular height toilet ADL Goal: Toilet Transfer - Progress: Goal set today Pt Will Perform Toileting - Clothing Manipulation: with modified independence;Sitting on 3-in-1 or toilet ADL Goal: Toileting - Clothing Manipulation - Progress: Goal set today Pt Will Perform Toileting - Hygiene: with modified independence;Leaning right and/or left on 3-in-1/toilet;Sit to stand from 3-in-1/toilet ADL Goal: Toileting - Hygiene - Progress: Goal set today  Visit Information  Last OT Received On: 09/18/12 Assistance Needed: +1    Subjective Data  Subjective: "I would be fine if I could go to sleep." Patient Stated Goal: To go home.   Prior Functioning     Home Living Lives With: Son (Pt states that 3 sons live with him.) Available Help at Discharge: Family;Available PRN/intermittently Type of Home: House Home Access: Stairs to enter [employer] of Steps: 3 Entrance Stairs-Rails: Right Home Layout: One level Bathroom Shower/Tub: Forensic scientist: Standard Home Adaptive Equipment: None Prior Function Level of Independence: Independent Able to Take Stairs?: Yes Driving: No Vocation: Retired Musician: No difficulties Dominant Hand: Right         Cognition  Overall Cognitive Status: Difficult to assess Difficult to assess due to: Level of arousal Arousal/Alertness: Lethargic Orientation Level: Appears intact for tasks assessed Behavior During Session: Lethargic    Extremity/Trunk Assessment Right Upper Extremity Assessment RUE ROM/Strength/Tone: Within functional levels (MMT: 4/5) Left Upper Extremity Assessment LUE ROM/Strength/Tone: Within  functional levels (MMT: 4/5)     Mobility Transfers Details for Transfer Assistance: Patient refused to get  out of bed.              End of Session OT - End of Session Activity Tolerance: Patient limited by fatigue Patient left: in bed;with call bell/phone within reach;with bed alarm set    Limmie Patricia, OTR/L 09/18/2012, 2:22 PM

## 2012-09-18 NOTE — Progress Notes (Signed)
Pt complaining of epigastric pain without radiation. VS are as follows HR 69 BP 133/76 O2 sat 98 on 2L. MD notified and orders received.

## 2012-09-18 NOTE — ED Notes (Signed)
Pt reports pain to the left side of his chest having SOB. Complaining of back & right leg hurting.

## 2012-09-18 NOTE — BH Assessment (Signed)
Assessment Note   [name] is an 78 y.o. male. The patient is in the hospital for medical reasons. An assessment was requested by his PCP and of the hospitalist, because of his history of Schizophrenia and alcohol abuse. Today the patient was cooperative and pleasant. He was not oriented, he had problems remembering the name of the hospital. He did not known the day month or years. He did not know the president of the [place]. He was vague in some of his responses. When asked about his alcohol use, he talked about how he had indigestion, and he would drink beer to make himself burp. When asked about any mental health history, he stated that he saw his PCP for this. He could not remember were he had been seen in the past, and did not remember if he had been inpatient or not. He is not suicidal nor is he homicidal. He is not hallucinated nor is he delusional. At this time the patient appears to be limited in his ability to care for himself. He should be seen by Social Work and he may be in need of DSS intervention. He did admit that he might need to stop drinking but he felt this was something he could do on his own.  Axis I:  Schizophrenia;Alcohol Abuse Axis II: Deferred Axis III:  Past Medical History  Diagnosis Date  . COPD (chronic obstructive pulmonary disease)   . Anxiety   . Alcohol abuse   . Anemia   . Generalized headaches   . Hypertension   . 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 Pulmonary nodule/lesion, solitary   . Schizoaffective disorder   . GI bleed     History of recurrent bleeding that dates back as far as 2004 per records,   Axis IV: other psychosocial or environmental problems, problems related to social environment, problems with access to health care services and problems with primary support group Axis V: 41-50 serious symptoms  Past Medical History:  Past Medical History  Diagnosis Date  . COPD (chronic  obstructive pulmonary disease)   . Anxiety   . Alcohol abuse   . Anemia   . Generalized headaches   . Hypertension   . 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 Pulmonary nodule/lesion, solitary   . Schizoaffective disorder   . GI bleed     History of recurrent bleeding that dates back as far as 2004 per records,    Past Surgical History  Procedure Date  . Colonoscopy Sept 2009    SLF: few small AVMs in ascending colon distal to IC . Ablated via BICAP.   Marland Kitchen Esophagogastroduodenoscopy Sept 2009    SLF: normal esophagus, small HH, 1-2 AVMs actively oozing in mid stomach, s/p BICAP, path benign   . Esophagogastroduodenoscopy 01/18/2012    Rourk-Erosive reflux esophagitis. Noncritical ring. Antral erosions/ small antral ulcer - appeared innocent  (H pylori serrology negative)  . Colonoscopy 03/09/2012    Procedure: COLONOSCOPY;  Surgeon: West Bali, MD;  Location: [facility];  Service: Endoscopy;  Laterality: N/A;  2:30  . Esophagogastroduodenoscopy 03/09/2012    Procedure: ESOPHAGOGASTRODUODENOSCOPY (EGD);  Surgeon: West Bali, MD;  Location: [facility];  Service: Endoscopy;  Laterality: N/A;    Family History:  Family History  Problem Relation Age of Onset  .  Colon cancer Neg Hx     Social History:  reports that he quit smoking about 43 years ago. His smoking use included Cigarettes. His smokeless tobacco use includes Snuff. He reports that he does not drink alcohol or use illicit drugs.  Additional Social History:     CIWA: CIWA-Ar BP: 146/79 mmHg Pulse Rate: 72  COWS:    Allergies:  Allergies  Allergen Reactions  . Black Pepper (Piper Nigrum) Itching    Home Medications:  Medications Prior to Admission  Medication Sig Dispense Refill  . albuterol (PROVENTIL HFA;VENTOLIN HFA) 108 (90 BASE) MCG/ACT inhaler Inhale 2 puffs into the lungs every 4 (four) hours as needed for wheezing.  1 Inhaler  3  .  albuterol (PROVENTIL HFA;VENTOLIN HFA) 108 (90 BASE) MCG/ACT inhaler Inhale 2 puffs into the lungs every 4 (four) hours as needed for wheezing.  1 Inhaler  0  . amoxicillin-clavulanate (AUGMENTIN) 875-125 MG per tablet Take 1 tablet by mouth 2 (two) times daily. For 10 days      . celecoxib (CELEBREX) 200 MG capsule Take 200 mg by mouth 2 (two) times daily.      . COMBIVENT RESPIMAT 20-100 MCG/ACT AERS respimat Inhale 20-100 mcg into the lungs Daily.      . diazepam (VALIUM) 5 MG tablet Take 5 mg by mouth 3 (three) times daily.       Marland Kitchen HYDROcodone-acetaminophen (NORCO/VICODIN) 5-325 MG per tablet Take 1 tablet by mouth 4 (four) times daily as needed. Pain.      . levalbuterol (XOPENEX HFA) 45 MCG/ACT inhaler Inhale 1-2 puffs into the lungs 2 (two) times daily. *Uses with an Aerochamber*      . levofloxacin (LEVAQUIN) 500 MG tablet Take 1 tablet (500 mg total) by mouth daily.  7 tablet  0  . LORazepam (ATIVAN) 1 MG tablet Take 1 tablet (1 mg total) by mouth 2 (two) times daily as needed for anxiety.  20 tablet  0  . predniSONE (DELTASONE) 10 MG tablet Take 2 tablets (20 mg total) by mouth daily.  14 tablet  0  . predniSONE (DELTASONE) 20 MG tablet 3 tabs po day one, then 2 po daily x 4 days  11 tablet  0    OB/GYN Status:  No LMP for male patient.  General Assessment Data Location of Assessment: [facility] ([address]) ACT Assessment: Yes Living Arrangements: Alone Can pt return to current living arrangement?: Yes (placement may become an issue) Admission Status: Voluntary Is patient capable of signing voluntary admission?: Yes Transfer from: Acute Hospital Referral Source: Medical Floor Inpatient  Education Status Is patient currently in school?: No  Risk to self Suicidal Ideation: No Suicidal Intent: No Is patient at risk for suicide?: No Suicidal Plan?: No Access to Means: No What has been your use of drugs/alcohol within the last 12 months?: drinks daily, states no more than 2  beers Previous Attempts/Gestures: No How many times?: 0  Other Self Harm Risks: continues to use alcohol Triggers for Past Attempts: None known Intentional Self Injurious Behavior: None Family Suicide History: No Recent stressful life event(s): Recent negative physical changes Persecutory voices/beliefs?: No Depression: Yes Substance abuse history and/or treatment for substance abuse?: Yes (patient drinks daily but denies any problem) Suicide prevention information given to non-admitted patients: Not applicable  Risk to Others Homicidal Ideation: No Thoughts of Harm to Others: No Current Homicidal Intent: No Current Homicidal Plan: No Access to Homicidal Means: No History of harm to others?: No Assessment of Violence:  None Noted Does patient have access to weapons?: No Criminal Charges Pending?: No Does patient have a court date: No  Psychosis Hallucinations: None noted Delusions: None noted  Mental Status Report Appear/Hygiene: Improved Eye Contact: Fair Motor Activity: Freedom of movement Speech: Soft;Slow;Logical/coherent Level of Consciousness: Alert;Quiet/awake Mood: Preoccupied Affect: Blunted;Depressed Anxiety Level: None Thought Processes: Circumstantial;Tangential Judgement: Unimpaired Orientation: Place (did not know date or ho;did not know presidentlliday) Obsessive Compulsive Thoughts/Behaviors: Moderate  Cognitive Functioning Concentration: Decreased Memory: Remote Intact;Recent Impaired IQ: Average Insight: Poor Impulse Control: Poor Appetite: Fair (eating at time of interview) Sleep: No Change Vegetative Symptoms: None  ADLScreening [facility] Assessment Services) Patient's cognitive ability adequate to safely complete daily activities?: Yes Patient able to express need for assistance with ADLs?: Yes Independently performs ADLs?: Yes (appropriate for developmental age)  Abuse/Neglect [facility]) Physical Abuse: Denies Verbal Abuse: Denies Sexual Abuse:  Denies  Prior Inpatient Therapy Prior Inpatient Therapy: No  Prior Outpatient Therapy Prior Outpatient Therapy: Yes Prior Therapy Dates: unknown Prior Therapy Facilty/Provider(s): may have been [facility] in [place] doesn't remeber Reason for Treatment: unknown  ADL Screening (condition at time of admission) Patient's cognitive ability adequate to safely complete daily activities?: Yes Patient able to express need for assistance with ADLs?: Yes Independently performs ADLs?: Yes (appropriate for developmental age) Weakness of Legs: None Weakness of Arms/Hands: None  Home Assistive Devices/Equipment Home Assistive Devices/Equipment: None  Therapy Consults (therapy consults require a physician order) PT Evaluation Needed: Yes (Comment) OT Evalulation Needed: Yes (Comment) SLP Evaluation Needed: No Abuse/Neglect Assessment (Assessment to be complete while patient is alone) Physical Abuse: Denies Verbal Abuse: Denies Sexual Abuse: Denies Exploitation of patient/patient's resources: Denies Self-Neglect: Denies Values / Beliefs Cultural Requests During Hospitalization: None Spiritual Requests During Hospitalization: None Consults Spiritual Care Consult Needed: No Social Work Consult Needed: No Merchant navy officer (For Healthcare) Advance Directive: Patient does not have advance directive;Patient would not like information Pre-existing out of facility DNR order (yellow form or pink MOST form): No Nutrition Screen- [facility] Patient's home diet: Regular Have you recently lost weight without trying?: No Have you been eating poorly because of a decreased appetite?: No Malnutrition Screening Tool Score: 0   Additional Information 1:1 In Past 12 Months?: No CIRT Risk: No Elopement Risk: No Does patient have medical clearance?: No     Disposition: Remain on medical floor. Disposition Disposition of Patient: Other dispositions Other disposition(s): Other (Comment)  (remain on medical floor; may need APS referral before discha)  On Site Evaluation by:   Reviewed with Physician:     Jearld Pies 09/18/2012 12:39 PM

## 2012-09-18 NOTE — ED Notes (Signed)
Attempted to give report to RN on 300.  Nurse unavailable at present time. Left message for return call.

## 2012-09-18 NOTE — Care Management Note (Signed)
    Page 1 of 1   09/19/2012     10:09:28 AM   CARE MANAGEMENT NOTE 09/19/2012  Patient:  [name], [name]   Account Number:  [id number]  Date Initiated:  09/18/2012  Documentation initiated by:  Sharrie Rothman  Subjective/Objective Assessment:   Pt admitted from home with anemia and gi bleeding. Pt lives with some family who are in and out of the residence. Pt will return home at discharge. Pt is independent with ADL's and uses a lawnmower for transportation.     Action/Plan:   Will follow for [facility] needs.   Anticipated DC Date:  09/20/2012   Anticipated DC Plan:  HOME/SELF CARE      DC Planning Services  CM consult      Choice offered to / List presented to:             Status of service:  Completed, signed off Medicare Important Message given?  NA - LOS <3 / Initial given by admissions (If response is "NO", the following Medicare IM given date fields will be blank) Date Medicare IM given:  09/18/2012 Date Additional Medicare IM given:    Discharge Disposition:  HOME/SELF CARE  Per UR Regulation:    If discussed at Long Length of Stay Meetings, dates discussed:    Comments:  09/19/12 1006 Arlyss Queen, RN BSN CM Pt discharged home today. CSW to notify APS of new referral per MD request in regards to poor home situation. No CM or HH needs noted.  09/18/12 1305 Arlyss Queen, RN BSN CM

## 2012-09-18 NOTE — Progress Notes (Signed)
UR Chart Review Completed  

## 2012-09-19 ENCOUNTER — Emergency Department (HOSPITAL_COMMUNITY)
Admission: EM | Admit: 2012-09-19 | Discharge: 2012-09-19 | Discharge status: Against Medical Advice | Payer: PRIVATE HEALTH INSURANCE | Attending: Emergency Medicine | Admitting: Emergency Medicine

## 2012-09-19 ENCOUNTER — Encounter (HOSPITAL_COMMUNITY): Payer: Self-pay | Admitting: *Deleted

## 2012-09-19 DIAGNOSIS — J449 Chronic obstructive pulmonary disease, unspecified: Secondary | ICD-10-CM

## 2012-09-19 DIAGNOSIS — Z79899 Other long term (current) drug therapy: Secondary | ICD-10-CM | POA: Insufficient documentation

## 2012-09-19 DIAGNOSIS — Z7901 Long term (current) use of anticoagulants: Secondary | ICD-10-CM | POA: Insufficient documentation

## 2012-09-19 DIAGNOSIS — Z8719 Personal history of other diseases of the digestive system: Secondary | ICD-10-CM | POA: Insufficient documentation

## 2012-09-19 DIAGNOSIS — F0390 Unspecified dementia without behavioral disturbance: Secondary | ICD-10-CM | POA: Diagnosis present

## 2012-09-19 DIAGNOSIS — F259 Schizoaffective disorder, unspecified: Secondary | ICD-10-CM | POA: Insufficient documentation

## 2012-09-19 DIAGNOSIS — F411 Generalized anxiety disorder: Secondary | ICD-10-CM | POA: Insufficient documentation

## 2012-09-19 DIAGNOSIS — Z87891 Personal history of nicotine dependence: Secondary | ICD-10-CM | POA: Insufficient documentation

## 2012-09-19 DIAGNOSIS — R911 Solitary pulmonary nodule: Secondary | ICD-10-CM | POA: Insufficient documentation

## 2012-09-19 DIAGNOSIS — J4489 Other specified chronic obstructive pulmonary disease: Secondary | ICD-10-CM | POA: Insufficient documentation

## 2012-09-19 DIAGNOSIS — I10 Essential (primary) hypertension: Secondary | ICD-10-CM | POA: Insufficient documentation

## 2012-09-19 LAB — TYPE AND SCREEN
ABO/RH(D): O POS
Unit division: 0

## 2012-09-19 LAB — CBC WITH DIFFERENTIAL/PLATELET
Basophils Absolute: 0 10*3/uL (ref 0.0–0.1)
HCT: 36.3 % — ABNORMAL LOW (ref 39.0–52.0)
Hemoglobin: 11.1 g/dL — ABNORMAL LOW (ref 13.0–17.0)
Lymphocytes Relative: 5 % — ABNORMAL LOW (ref 12–46)
Monocytes Absolute: 0.8 10*3/uL (ref 0.1–1.0)
Monocytes Relative: 12 % (ref 3–12)
Neutro Abs: 5.2 10*3/uL (ref 1.7–7.7)
RDW: 21.4 % — ABNORMAL HIGH (ref 11.5–15.5)
WBC: 6.6 10*3/uL (ref 4.0–10.5)

## 2012-09-19 MED ORDER — ALBUTEROL SULFATE (5 MG/ML) 0.5% IN NEBU
2.5000 mg | INHALATION_SOLUTION | Freq: Once | RESPIRATORY_TRACT | Status: AC
  Administered 2012-09-19: 2.5 mg via RESPIRATORY_TRACT
  Filled 2012-09-19: qty 0.5

## 2012-09-19 MED ORDER — IPRATROPIUM BROMIDE 0.02 % IN SOLN
0.5000 mg | Freq: Once | RESPIRATORY_TRACT | Status: AC
  Administered 2012-09-19: 0.5 mg via RESPIRATORY_TRACT
  Filled 2012-09-19: qty 2.5

## 2012-09-19 MED ORDER — ALBUTEROL SULFATE HFA 108 (90 BASE) MCG/ACT IN AERS
2.0000 | INHALATION_SPRAY | Freq: Four times a day (QID) | RESPIRATORY_TRACT | Status: DC
  Administered 2012-09-19: 2 via RESPIRATORY_TRACT

## 2012-09-19 MED ORDER — METHYLPREDNISOLONE SODIUM SUCC 125 MG IJ SOLR
125.0000 mg | Freq: Once | INTRAMUSCULAR | Status: AC
  Administered 2012-09-19: 125 mg via INTRAMUSCULAR
  Filled 2012-09-19: qty 2

## 2012-09-19 MED ORDER — ALBUTEROL SULFATE HFA 108 (90 BASE) MCG/ACT IN AERS
INHALATION_SPRAY | RESPIRATORY_TRACT | Status: AC
  Filled 2012-09-19: qty 6.7

## 2012-09-19 NOTE — Clinical Social Work Psychosocial (Signed)
Clinical Social Work Department BRIEF PSYCHOSOCIAL ASSESSMENT 09/19/2012  Patient:  [name], [name]     Account Number:  [id number]     Admit date:  09/18/2012  Clinical Social Worker:  Nancie Neas  Date/Time:  09/19/2012 01:05 PM  Referred by:  Physician  Date Referred:  09/19/2012 Referred for  Competency/Guardianship   Other Referral:   Interview type:  Patient Other interview type:    PSYCHOSOCIAL DATA Living Status:  FAMILY Admitted from facility:   Level of care:   Primary support name:  Mellody Dance Primary support relationship to patient:  CHILD, ADULT Degree of support available:   limited per pt    CURRENT CONCERNS Current Concerns  Other - See comment   Other Concerns:   competency    SOCIAL WORK ASSESSMENT / PLAN CSW met with pt at bedside. Pt alert and oriented to self only. He does not know the month, year, president, or name of hospital. He states he lives with his three sons who by his report provide very limited support. Pt ambulates independently and said he rides his lawn mower around town to appointments and to pick up things. CSW attempted to reach Mellody Dance who is listed on chart but unable to reach. No one answered at home and no voicemail set up. Pt indicates Mellody Dance is a Chiropractor. CSW made APS report to [employer]. Per Victorino Dike, okay to d/c home and will follow up with pt at his home if case opened. Pt is ready to go home and wants to walk, but [facility] arranged for transport home.   Assessment/plan status:  Referral to [facility] Other assessment/ plan:   Information/referral to community resources:   DSS  [facility]    PATIENT'S/FAMILY'S RESPONSE TO PLAN OF CARE: Pt is not oriented to time or place. Okay to d/c home per DSS. Pt d/c via [facility]. Pt assessed by ACT team yesterday and no mental health concerns at this time, but social work consult recommended for possible DSS involvement.        Derenda Fennel, [place] [phone]

## 2012-09-19 NOTE — Progress Notes (Signed)
Pt refuses PT today, but he was observed ambulating in the hallway with OT earlier this AM.  His gait appeared to be very stable with endurance WNL.  Will d/c PT due to refusals of increased activity.

## 2012-09-19 NOTE — ED Notes (Signed)
Sob, for 1 hour

## 2012-09-19 NOTE — Progress Notes (Signed)
Occupational Therapy Treatment Patient Details Name: [name] MRN: [id number] DOB: 1930-07-10 Today's Date: 09/19/2012 Time: 8657-8469 OT Time Calculation (min): 38 min 38' ADL 38'  OT Assessment / Plan / Recommendation Comments on Treatment Session A:  Pt needs max encouragement to function at highest level.  Patient preferd to stay in bed.    Follow Up Recommendations       Barriers to Discharge       Equipment Recommendations       Recommendations for Other Services    Frequency     Plan Discharge plan remains appropriate    Precautions / Restrictions Precautions Precautions: Fall   Pertinent Vitals/Pain     ADL  Grooming: Performed;Wash/dry hands;Wash/dry face;Set up Where Assessed - Grooming: Unsupported sitting Upper Body Bathing: Performed;Set up Where Assessed - Upper Body Bathing: Unsupported sitting Upper Body Dressing: Performed;Set up Where Assessed - Upper Body Dressing: Unsupported standing Lower Body Dressing: Performed;Set up Where Assessed - Lower Body Dressing: Unsupported sitting ADL Comments: Pt. sat EOB and pulled his feet up to put his socks on without loss of balance.    OT Diagnosis:    OT Problem List:   OT Treatment Interventions:     OT Goals Acute Rehab OT Goals OT Goal Formulation: With patient Time For Goal Achievement: 09/25/12 Potential to Achieve Goals: Good ADL Goals Pt Will Perform Grooming: Independently;Standing at sink Pt Will Perform Upper Body Bathing: Independently;Sitting, edge of bed ADL Goal: Upper Body Bathing - Progress: Met Pt Will Perform Lower Body Bathing: with modified independence;Sit to stand from bed ADL Goal: Lower Body Bathing - Progress: Met Pt Will Perform Upper Body Dressing: Independently;Sitting, bed Pt Will Perform Lower Body Dressing: with modified independence;Sit to stand from bed ADL Goal: Lower Body Dressing - Progress: Met Pt Will Transfer to Toilet: with modified independence;Stand  pivot transfer;Ambulation;Regular height toilet Pt Will Perform Toileting - Clothing Manipulation: with modified independence;Sitting on 3-in-1 or toilet Pt Will Perform Toileting - Hygiene: with modified independence;Leaning right and/or left on 3-in-1/toilet;Sit to stand from 3-in-1/toilet  Visit Information  Last OT Received On: 09/19/12 Assistance Needed: +1    Subjective Data  Subjective: S:  I don't know if I can do all this, I'm not eating the right kind of food.   Prior Functioning       Cognition  Overall Cognitive Status: Appears within functional limits for tasks assessed/performed Arousal/Alertness: Awake/alert Orientation Level: Appears intact for tasks assessed Behavior During Session: [facility] for tasks performed    Mobility  Shoulder Instructions Transfers Transfers: Sit to Stand Sit to Stand: 5: Supervision Details for Transfer Assistance: Supervision only because of accasional swaying when standing.       Exercises      Balance     End of Session OT - End of Session Equipment Utilized During Treatment: Gait belt Activity Tolerance: Patient tolerated treatment well Patient left: in chair;with call bell/phone within reach;with chair alarm set  GO     Merry Pond L 09/19/2012, 10:51 AM

## 2012-09-19 NOTE — Progress Notes (Signed)
Subjective: He is awake and alert but confused. He was seen by the team and it was felt that he probably needed Adult Protective Services involved. He apparently has sons who live with him but I have never seen them so I don't know if they are active at all in his care. He does not seem to be delusional but I think he probably has dementia  Objective: Vital signs in last 24 hours: Temp:  [98 F (36.7 C)-98.6 F (37 C)] 98 F (36.7 C) (11/20 0609) Pulse Rate:  [57-105] 85  (11/20 0613) Resp:  [16-18] 18  (11/20 0613) BP: (114-146)/(67-79) 123/70 mmHg (11/20 0613) SpO2:  [92 %-100 %] 93 % (11/20 0646) Weight change:  Last BM Date: 09/17/12  Intake/Output from previous day: 11/19 0701 - 11/20 0700 In: 2330 [P.O.:720; I.V.:1237.5; Blood:372.5] Out: 2125 [Urine:2125]  PHYSICAL EXAM General appearance: alert and no distress Resp: clear to auscultation bilaterally Cardio: regular rate and rhythm, S1, S2 normal, no murmur, click, rub or gallop GI: soft, non-tender; bowel sounds normal; no masses,  no organomegaly Extremities: extremities normal, atraumatic, no cyanosis or edema  Lab Results:    Basic Metabolic Panel:  Basename 09/18/12 0512 09/18/12 0109  NA 139 137  K 3.3* 3.3*  CL 100 95*  CO2 33* 34*  GLUCOSE 90 105*  BUN 20 22  CREATININE 0.85 0.90  CALCIUM 8.7 9.4  MG 2.2 --  PHOS -- --   Liver Function Tests:  [facility] 09/18/12 0512  AST 19  ALT 13  ALKPHOS 40  BILITOT 0.3  PROT 5.0*  ALBUMIN 2.8*   No results found for this basename: LIPASE:2,AMYLASE:2 in the last 72 hours No results found for this basename: AMMONIA:2 in the last 72 hours CBC:  Basename 09/19/12 0446 09/18/12 0512 09/18/12 0109  WBC 6.6 5.4 --  NEUTROABS 5.2 -- 5.6  HGB 11.1* 6.7* --  HCT 36.3* 22.9* --  MCV 78.7 74.8* --  PLT 388 374 --   Cardiac Enzymes:  Basename 09/18/12 1133 09/18/12 0512 09/18/12 0109  CKTOTAL -- -- --  CKMB -- -- --  CKMBINDEX -- -- --  TROPONINI <0.30  <0.30 <0.30   BNP: No results found for this basename: PROBNP:3 in the last 72 hours D-Dimer: No results found for this basename: DDIMER:2 in the last 72 hours CBG: No results found for this basename: GLUCAP:6 in the last 72 hours Hemoglobin A1C: No results found for this basename: HGBA1C in the last 72 hours Fasting Lipid Panel:  Basename 09/18/12 0517  CHOL 171  HDL 100  LDLCALC 60  TRIG 53  CHOLHDL 1.7  LDLDIRECT --   Thyroid Function Tests:  Basename 09/18/12 0512  TSH 2.403  T4TOTAL --  FREET4 --  T3FREE --  THYROIDAB --   Anemia Panel: No results found for this basename: VITAMINB12,FOLATE,FERRITIN,TIBC,IRON,RETICCTPCT in the last 72 hours Coagulation:  Basename 09/18/12 0109  LABPROT 13.6  INR 1.05   Urine Drug Screen: Drugs of Abuse     Component Value Date/Time   LABOPIA NONE DETECTED 11/08/2007 2330   COCAINSCRNUR NONE DETECTED 11/08/2007 2330   LABBENZ POSITIVE* 11/08/2007 2330   AMPHETMU NONE DETECTED 11/08/2007 2330   THCU NONE DETECTED 11/08/2007 2330   LABBARB  Value: NONE DETECTED        DRUG SCREEN FOR MEDICAL PURPOSES ONLY.  IF CONFIRMATION IS NEEDED FOR ANY PURPOSE, NOTIFY LAB WITHIN 5 DAYS.        LOWEST DETECTABLE LIMITS FOR URINE DRUG SCREEN Drug  Class       Cutoff (ng/mL) Amphetamine      1000 Barbiturate      200 Benzodiazepine   200 Cocaine          300 Opiates          300 THC              50 11/08/2007 2330    Alcohol Level:  Basename 09/18/12 0246  ETH <11   Urinalysis: No results found for this basename: COLORURINE:2,APPERANCEUR:2,LABSPEC:2,PHURINE:2,GLUCOSEU:2,HGBUR:2,BILIRUBINUR:2,KETONESUR:2,PROTEINUR:2,UROBILINOGEN:2,NITRITE:2,LEUKOCYTESUR:2 in the last 72 hours Misc. Labs:  ABGS No results found for this basename: PHART,PCO2,PO2ART,TCO2,HCO3 in the last 72 hours CULTURES No results found for this or any previous visit (from the past 240 hour(s)). Studies/Results: Dg Chest Port 1 View  09/18/2012  *RADIOLOGY REPORT*  Clinical Data:  Left chest pain, history COPD, smoking, hypertension  PORTABLE CHEST - 1 VIEW  Comparison: Portable exam 0110 hours compared to 09/06/2012  Findings: Normal heart size, mediastinal contours, and pulmonary vascularity. Emphysematous changes without infiltrate, pleural effusion or pneumothorax. Minimal peribronchial thickening. Bones demineralized.  IMPRESSION: Changes of COPD. No acute abnormalities.   Original Report Authenticated By: Ulyses Southward, M.D.     Medications:  Prior to Admission:  Prescriptions prior to admission  Medication Sig Dispense Refill  . albuterol (PROVENTIL HFA;VENTOLIN HFA) 108 (90 BASE) MCG/ACT inhaler Inhale 2 puffs into the lungs every 4 (four) hours as needed for wheezing.  1 Inhaler  3  . amoxicillin-clavulanate (AUGMENTIN) 875-125 MG per tablet Take 1 tablet by mouth 2 (two) times daily. For 10 days      . HYDROcodone-acetaminophen (NORCO/VICODIN) 5-325 MG per tablet Take 1 tablet by mouth 4 (four) times daily as needed. Pain.      Marland Kitchen levofloxacin (LEVAQUIN) 500 MG tablet Take 500 mg by mouth daily.      . predniSONE (DELTASONE) 10 MG tablet Take 2 tablets (20 mg total) by mouth daily.  14 tablet  0  . predniSONE (DELTASONE) 20 MG tablet Take 20 mg by mouth daily.       Scheduled:   . influenza  inactive virus vaccine  0.5 mL Intramuscular Tomorrow-1000  . metoprolol  5 mg Intravenous Q6H  . pantoprazole (PROTONIX) IV  40 mg Intravenous Q12H  . simvastatin  20 mg Oral q1800  . sodium chloride  3 mL Intravenous Q12H   Continuous:   . 0.9 % NaCl with KCl 20 mEq / L 50 mL/hr at 09/18/12 0554   YQM:VHQIONGEX, bisacodyl, HYDROcodone-acetaminophen, ipratropium, nitroGLYCERIN, ondansetron (ZOFRAN) IV, ondansetron, [EXPIRED] sodium phosphate  Assesment: His hemoglobin level is overall 11 now. He is confused. I think his confusion may be partially hospital-acquired confusion. I think he is ready for discharge but does need Adult Protective Services to evaluate his home  situation and see if he needs a guardian appointed Active Problems:  AV malformation of gastrointestinal tract  Alcoholism, chronic  Anemia due to chronic blood loss  Chest pain  COPD (chronic obstructive pulmonary disease)  Schizophrenia    Plan: Discharge home    LOS: 1 day   Kenyen Candy L 09/19/2012, 9:24 AM

## 2012-09-19 NOTE — ED Notes (Signed)
Patient walking around hallways and into patient rooms, states he knows them.  Informed him that he cannot be walking into patient rooms and that he needed to go back to his room.  He stated that he needed to get out of here because he had no sleep and walked out.

## 2012-09-19 NOTE — ED Provider Notes (Signed)
History     CSN: [id number]  Arrival date & time 09/19/12  1846   First MD Initiated Contact with Patient 09/19/12 1909      Chief Complaint  Patient presents with  . Shortness of Breath    (Consider location/radiation/quality/duration/timing/severity/associated sxs/prior treatment) Patient is a 78 y.o. male presenting with shortness of breath. The history is provided by the patient (the pt complains of some sob). No language interpreter was used.  Shortness of Breath  The current episode started today. The onset was sudden. The problem occurs frequently. The problem has been unchanged. The problem is moderate. Nothing relieves the symptoms. Nothing aggravates the symptoms. Associated symptoms include shortness of breath. Pertinent negatives include no chest pain and no cough.    Past Medical History  Diagnosis Date  . COPD (chronic obstructive pulmonary disease)   . Anxiety   . Alcohol abuse   . Anemia   . Generalized headaches   . Hypertension   . 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 Pulmonary nodule/lesion, solitary   . Schizoaffective disorder   . GI bleed     History of recurrent bleeding that dates back as far as 2004 per records,    Past Surgical History  Procedure Date  . Colonoscopy Sept 2009    SLF: few small AVMs in ascending colon distal to IC . Ablated via BICAP.   Marland Kitchen Esophagogastroduodenoscopy Sept 2009    SLF: normal esophagus, small HH, 1-2 AVMs actively oozing in mid stomach, s/p BICAP, path benign   . Esophagogastroduodenoscopy 01/18/2012    Rourk-Erosive reflux esophagitis. Noncritical ring. Antral erosions/ small antral ulcer - appeared innocent  (H pylori serrology negative)  . Colonoscopy 03/09/2012    Procedure: COLONOSCOPY;  Surgeon: West Bali, MD;  Location: [facility];  Service: Endoscopy;  Laterality: N/A;  2:30  . Esophagogastroduodenoscopy 03/09/2012    Procedure:  ESOPHAGOGASTRODUODENOSCOPY (EGD);  Surgeon: West Bali, MD;  Location: [facility];  Service: Endoscopy;  Laterality: N/A;    Family History  Problem Relation Age of Onset  . Colon cancer Neg Hx     History  Substance Use Topics  . Smoking status: Former Smoker    Types: Cigarettes    Quit date: 05/07/1969  . Smokeless tobacco: Current User    Types: Snuff  . Alcohol Use: No      Review of Systems  Constitutional: Negative for fatigue.  HENT: Negative for congestion, sinus pressure and ear discharge.   Eyes: Negative for discharge.  Respiratory: Positive for shortness of breath. Negative for cough.   Cardiovascular: Negative for chest pain.  Gastrointestinal: Negative for abdominal pain and diarrhea.  Genitourinary: Negative for frequency and hematuria.  Musculoskeletal: Negative for back pain.  Skin: Negative for rash.  Neurological: Negative for seizures and headaches.  Hematological: Negative.   Psychiatric/Behavioral: Negative for hallucinations.    Allergies  Black pepper  Home Medications   Current Outpatient Rx  Name  Route  Sig  Dispense  Refill  . ALBUTEROL SULFATE HFA 108 (90 BASE) MCG/ACT IN AERS   Inhalation   Inhale 2 puffs into the lungs every 4 (four) hours as needed for wheezing.   1 Inhaler   3   . AMOXICILLIN-POT CLAVULANATE 875-125 MG PO TABS   Oral   Take 1 tablet by mouth 2 (two) times daily. For 10 days         .  CELEBREX 200 MG PO CAPS               . COMBIVENT RESPIMAT 20-100 MCG/ACT IN AERS               . HYDROCODONE-ACETAMINOPHEN 5-325 MG PO TABS   Oral   Take 1 tablet by mouth 4 (four) times daily as needed. Pain.         Marland Kitchen LEVOFLOXACIN 500 MG PO TABS   Oral   Take 500 mg by mouth daily.         Marland Kitchen PREDNISONE 10 MG PO TABS   Oral   Take 2 tablets (20 mg total) by mouth daily.   14 tablet   0   . PREDNISONE 20 MG PO TABS   Oral   Take 20 mg by mouth daily.           BP 134/81  Pulse 98  Temp  98.5 F (36.9 C) (Oral)  Resp 24  Wt 124 lb (56.246 kg)  SpO2 96%  Physical Exam  Constitutional: He is oriented to person, place, and time. He appears well-developed.  HENT:  Head: Normocephalic and atraumatic.  Eyes: Conjunctivae normal and EOM are normal. No scleral icterus.  Neck: Neck supple. No thyromegaly present.  Cardiovascular: Normal rate and regular rhythm.  Exam reveals no gallop and no friction rub.   No murmur heard. Pulmonary/Chest: No stridor. He has wheezes. He has no rales. He exhibits no tenderness.       Mild wheezing  Abdominal: He exhibits no distension. There is no tenderness. There is no rebound.  Musculoskeletal: Normal range of motion. He exhibits no edema.  Lymphadenopathy:    He has no cervical adenopathy.  Neurological: He is oriented to person, place, and time. Coordination normal.  Skin: No rash noted. No erythema.  Psychiatric: He has a normal mood and affect. His behavior is normal.    ED Course  Procedures (including critical care time)  Labs Reviewed - No data to display Dg Chest [facility] 1 View  09/18/2012  *RADIOLOGY REPORT*  Clinical Data: Left chest pain, history COPD, smoking, hypertension  PORTABLE CHEST - 1 VIEW  Comparison: Portable exam 0110 hours compared to 09/06/2012  Findings: Normal heart size, mediastinal contours, and pulmonary vascularity. Emphysematous changes without infiltrate, pleural effusion or pneumothorax. Minimal peribronchial thickening. Bones demineralized.  IMPRESSION: Changes of COPD. No acute abnormalities.   Original Report Authenticated By: Ulyses Southward, M.D.      1. COPD (chronic obstructive pulmonary disease)      Pt improved with tx,  But left before I was ready to discharge him home MDM          [name], MD 09/19/12 2136

## 2012-09-20 NOTE — Progress Notes (Signed)
Patient received discharge instructions along with follow up appointments. Patient verbalized understanding of all instructions. Patient was escorted by staff via wheelchair to vehicle. Patient discharged to home in stable condition. 

## 2012-09-22 NOTE — Discharge Summary (Signed)
Physician Discharge Summary  Patient ID: [name] MRN: [id number] DOB/AGE: 76-Aug-1931 76 y.o. Primary Care Physician:Evelynne Spiers L, MD Admit date: 09/18/2012 Discharge date: 09/22/2012    Discharge Diagnoses:   Active Problems:  AV malformation of gastrointestinal tract  Alcoholism, chronic  Anemia due to chronic blood loss  Chest pain  COPD (chronic obstructive pulmonary disease)  Schizophrenia  Dementia     Medication List     As of 09/22/2012  7:37 AM    TAKE these medications         albuterol 108 (90 BASE) MCG/ACT inhaler   Commonly known as: PROVENTIL HFA;VENTOLIN HFA   Inhale 2 puffs into the lungs every 4 (four) hours as needed for wheezing.      amoxicillin-clavulanate 875-125 MG per tablet   Commonly known as: AUGMENTIN   Take 1 tablet by mouth 2 (two) times daily. For 10 days      HYDROcodone-acetaminophen 5-325 MG per tablet   Commonly known as: NORCO/VICODIN   Take 1 tablet by mouth 4 (four) times daily as needed. Pain.      levofloxacin 500 MG tablet   Commonly known as: LEVAQUIN   Take 500 mg by mouth daily.      predniSONE 10 MG tablet   Commonly known as: DELTASONE   Take 2 tablets (20 mg total) by mouth daily.      predniSONE 20 MG tablet   Commonly known as: DELTASONE   Take 20 mg by mouth daily.        Discharged Condition: Improved    Consults: Gastroenterology/ACT team  Significant Diagnostic Studies: Dg Chest 2 View  08/26/2012  *RADIOLOGY REPORT*  Clinical Data: Shortness of breath.  CHEST - 2 VIEW  Comparison: Single view of the chest 08/16/2012 and PA and lateral chest 04/03/2010.  Findings: Lungs appear emphysematous but are clear.  No pneumothorax or pleural fluid.  Heart size upper normal.  IMPRESSION: Hyperexpansion compatible with emphysema.  No acute finding.   Original Report Authenticated By: Bernadene Bell. Maricela Curet, M.D.    Ct Chest W Contrast  09/12/2012  *RADIOLOGY REPORT*  Clinical Data: Shortness of breath   CT CHEST WITH CONTRAST  Technique:  Multidetector CT imaging of the chest was performed following the standard protocol during bolus administration of intravenous contrast.  Contrast: 80mL OMNIPAQUE IOHEXOL 300 MG/ML  SOLN  Comparison: None.  Findings: The lungs are well-aerated bilaterally without evidence of focal infiltrate or sizable effusion.  Minimal scarring is noted in the left lower lobe along the major fissure.  The small subpleural calcification is noted on image number 15 of series 3 this is most consistent with a small granuloma.  No sizable hilar or mediastinal adenopathy is noted.  The thoracic aorta demonstrates calcification without aneurysmal dilatation.  Heavy coronary calcifications are seen.  The visualized portions of the upper abdomen are within normal limits. No acute bony abnormality is noted.  IMPRESSION: Findings of prior granulomatous disease.  Minimal scarring in the left lung base.  No acute abnormality is noted.   Original Report Authenticated By: Alcide Clever, M.D.    Dg Chest Port 1 View  09/18/2012  *RADIOLOGY REPORT*  Clinical Data: Left chest pain, history COPD, smoking, hypertension  PORTABLE CHEST - 1 VIEW  Comparison: Portable exam 0110 hours compared to 09/06/2012  Findings: Normal heart size, mediastinal contours, and pulmonary vascularity. Emphysematous changes without infiltrate, pleural effusion or pneumothorax. Minimal peribronchial thickening. Bones demineralized.  IMPRESSION: Changes of COPD. No acute abnormalities.   Original  Report Authenticated By: Ulyses Southward, M.D.    Dg Abd Acute W/chest  09/06/2012  *RADIOLOGY REPORT*  Clinical Data: Shortness of breath, abdominal pain  ACUTE ABDOMEN SERIES (ABDOMEN 2 VIEW & CHEST 1 VIEW)  Comparison: 08/26/2012  Findings: Heart size upper normal.  Mediastinal contours unchanged. No confluent airspace opacity.  Tiny calcified appearing nodule right upper lobe favors a granuloma. Emphysematous changes again suggested.  No  free intraperitoneal air.  Nonspecific bowel gas pattern with air noted within loops of large and small bowel.  Multilevel degenerative changes.  No acute osseous finding. Vascular calcifications and nonspecific calcific densities along the left pelvic sidewall.  IMPRESSION: Nonspecific bowel gas pattern without evidence for obstruction.   Original Report Authenticated By: Jearld Lesch, M.D.     Lab Results: Basic Metabolic Panel: No results found for this basename: NA:2,K:2,CL:2,CO2:2,GLUCOSE:2,BUN:2,CREATININE:2,CALCIUM:2,MG:2,PHOS:2 in the last 72 hours Liver Function Tests: No results found for this basename: AST:2,ALT:2,ALKPHOS:2,BILITOT:2,PROT:2,ALBUMIN:2 in the last 72 hours   CBC: No results found for this basename: WBC:2,NEUTROABS:2,HGB:2,HCT:2,MCV:2,PLT:2 in the last 72 hours  No results found for this or any previous visit (from the past 240 hour(s)).   Hospital Course: He was admitted with anemia. He has had multiple episodes of anemia that seemed to have been related to chronic blood loss from AV malformations of the GI tract. He is chronically short of breath. He was clearly very confused here which is a chronic situation. He received blood. He had GI consultation it was felt that his workup could be finished as an outpatient. He had consultation with the ACT team. It was felt that he would need an outpatient evaluation by Adult Protective Services. He does live with 2 sons. By the time of discharge his hemoglobin was greater than 10 he looked much more comfortable. Discharge Exam: Blood pressure 123/70, pulse 85, temperature 98 F (36.7 C), temperature source Oral, resp. rate 18, height 5\' 5"  (1.651 m), weight 55.792 kg (123 lb), SpO2 93.00%. He is awake and alert. He is disoriented which is chronic. His chest is clear. His heart is regular. His abdomen is soft.  Disposition: Home. He will have evaluation by Adult Protective Services because he is very confused chronically and  has made essentially daily visits to the emergency room with various complaints. He also comes to my office on approximately every other day basis. He may need a guardian appointed. He refuses to take chronic medications. He says he will keep an outpatient appointment with a gastroenterologist. This is clearly not an ideal situation but hopefully Adult Protective Services can help. This problem has been going on for at least a year      Discharge Orders    Future Orders Please Complete By Expires   Discharge patient           Signed: Fredirick Maudlin Pager [phone]  09/22/2012, 7:37 AM

## 2012-09-28 ENCOUNTER — Emergency Department (HOSPITAL_COMMUNITY)
Admission: EM | Admit: 2012-09-28 | Discharge: 2012-09-28 | Disposition: A | Discharge status: Home / Self Care | Payer: PRIVATE HEALTH INSURANCE | Attending: Emergency Medicine | Admitting: Emergency Medicine

## 2012-09-28 ENCOUNTER — Encounter (HOSPITAL_COMMUNITY): Payer: Self-pay | Admitting: *Deleted

## 2012-09-28 DIAGNOSIS — Z79899 Other long term (current) drug therapy: Secondary | ICD-10-CM | POA: Insufficient documentation

## 2012-09-28 DIAGNOSIS — Z7982 Long term (current) use of aspirin: Secondary | ICD-10-CM | POA: Insufficient documentation

## 2012-09-28 DIAGNOSIS — Z87891 Personal history of nicotine dependence: Secondary | ICD-10-CM | POA: Insufficient documentation

## 2012-09-28 DIAGNOSIS — R51 Headache: Secondary | ICD-10-CM | POA: Insufficient documentation

## 2012-09-28 DIAGNOSIS — Z8679 Personal history of other diseases of the circulatory system: Secondary | ICD-10-CM | POA: Insufficient documentation

## 2012-09-28 DIAGNOSIS — Z8719 Personal history of other diseases of the digestive system: Secondary | ICD-10-CM | POA: Insufficient documentation

## 2012-09-28 DIAGNOSIS — Z91199 Patient's noncompliance with other medical treatment and regimen due to unspecified reason: Secondary | ICD-10-CM | POA: Insufficient documentation

## 2012-09-28 DIAGNOSIS — J45909 Unspecified asthma, uncomplicated: Secondary | ICD-10-CM | POA: Insufficient documentation

## 2012-09-28 DIAGNOSIS — Z8709 Personal history of other diseases of the respiratory system: Secondary | ICD-10-CM | POA: Insufficient documentation

## 2012-09-28 DIAGNOSIS — J449 Chronic obstructive pulmonary disease, unspecified: Secondary | ICD-10-CM | POA: Insufficient documentation

## 2012-09-28 DIAGNOSIS — F1010 Alcohol abuse, uncomplicated: Secondary | ICD-10-CM | POA: Insufficient documentation

## 2012-09-28 DIAGNOSIS — Z862 Personal history of diseases of the blood and blood-forming organs and certain disorders involving the immune mechanism: Secondary | ICD-10-CM | POA: Insufficient documentation

## 2012-09-28 DIAGNOSIS — Z8659 Personal history of other mental and behavioral disorders: Secondary | ICD-10-CM | POA: Insufficient documentation

## 2012-09-28 DIAGNOSIS — J4489 Other specified chronic obstructive pulmonary disease: Secondary | ICD-10-CM | POA: Insufficient documentation

## 2012-09-28 DIAGNOSIS — Z9119 Patient's noncompliance with other medical treatment and regimen: Secondary | ICD-10-CM | POA: Insufficient documentation

## 2012-09-28 MED ORDER — ALBUTEROL SULFATE (5 MG/ML) 0.5% IN NEBU
5.0000 mg | INHALATION_SOLUTION | Freq: Once | RESPIRATORY_TRACT | Status: AC
Start: 2012-09-28 — End: 2012-09-28
  Administered 2012-09-28: 5 mg via RESPIRATORY_TRACT
  Filled 2012-09-28: qty 1

## 2012-09-28 MED ORDER — ALBUTEROL SULFATE (5 MG/ML) 0.5% IN NEBU
5.0000 mg | INHALATION_SOLUTION | Freq: Once | RESPIRATORY_TRACT | Status: AC
  Administered 2012-09-28: 5 mg via RESPIRATORY_TRACT
  Filled 2012-09-28: qty 1

## 2012-09-28 MED ORDER — IPRATROPIUM BROMIDE 0.02 % IN SOLN
0.5000 mg | Freq: Once | RESPIRATORY_TRACT | Status: AC
  Administered 2012-09-28: 0.5 mg via RESPIRATORY_TRACT
  Filled 2012-09-28: qty 2.5

## 2012-09-28 NOTE — ED Notes (Signed)
Pt alert & oriented x4, stable gait. Patient  given discharge instructions, paperwork & prescription(s). Patient verbalized understanding. Pt left department w/ no further questions. 

## 2012-09-28 NOTE — ED Notes (Signed)
Pt states no way home. Family called to pick him up.

## 2012-09-28 NOTE — ED Provider Notes (Signed)
History   This chart was scribed for Flint Melter, MD by Melba Coon, ED Scribe. The patient was seen in room APA03/APA03 and the patient's care was started at 9:24PM.    CSN: [id number]  Arrival date & time 09/28/12  4098   First MD Initiated Contact with Patient 09/28/12 2106      Chief Complaint  Patient presents with  . Shortness of Breath    (Consider location/radiation/quality/duration/timing/severity/associated sxs/prior treatment) The history is provided by the patient. No language interpreter was used.   [name] is a 78 y.o. male who presents to the Emergency Department complaining of persistent, mild to moderate shortness of breath with an onset today. He has a Hx of SOB in the past along with multiple ED visits in the past 6 months and reports that he wants a breathing treatment today. He is feeling tired but overall well. He has no other complaints. No other pertinent medical symptoms.  Past Medical History  Diagnosis Date  . COPD (chronic obstructive pulmonary disease)   . Anxiety   . Alcohol abuse   . Anemia   . Generalized headaches   . Hypertension   . 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 Pulmonary nodule/lesion, solitary   . Schizoaffective disorder   . GI bleed     History of recurrent bleeding that dates back as far as 2004 per records,    Past Surgical History  Procedure Date  . Colonoscopy Sept 2009    SLF: few small AVMs in ascending colon distal to IC . Ablated via BICAP.   Marland Kitchen Esophagogastroduodenoscopy Sept 2009    SLF: normal esophagus, small HH, 1-2 AVMs actively oozing in mid stomach, s/p BICAP, path benign   . Esophagogastroduodenoscopy 01/18/2012    Rourk-Erosive reflux esophagitis. Noncritical ring. Antral erosions/ small antral ulcer - appeared innocent  (H pylori serrology negative)  . Colonoscopy 03/09/2012    Procedure: COLONOSCOPY;  Surgeon: West Bali, MD;   Location: [facility];  Service: Endoscopy;  Laterality: N/A;  2:30  . Esophagogastroduodenoscopy 03/09/2012    Procedure: ESOPHAGOGASTRODUODENOSCOPY (EGD);  Surgeon: West Bali, MD;  Location: [facility];  Service: Endoscopy;  Laterality: N/A;    Family History  Problem Relation Age of Onset  . Colon cancer Neg Hx     History  Substance Use Topics  . Smoking status: Former Smoker    Types: Cigarettes    Quit date: 05/07/1969  . Smokeless tobacco: Current User    Types: Snuff  . Alcohol Use: No      Review of Systems  Respiratory: Positive for shortness of breath.    10 Systems reviewed and all are negative for acute change except as noted in the HPI.   Allergies  Black pepper  Home Medications   Current Outpatient Rx  Name  Route  Sig  Dispense  Refill  . ALBUTEROL SULFATE HFA 108 (90 BASE) MCG/ACT IN AERS   Inhalation   Inhale 2 puffs into the lungs every 4 (four) hours as needed for wheezing.   1 Inhaler   3   . AMOXICILLIN-POT CLAVULANATE 875-125 MG PO TABS   Oral   Take 1 tablet by mouth 2 (two) times daily. For 10 days         . ASPIRIN EC 81 MG PO TBEC   Oral   Take 81 mg by mouth daily as needed.  For pain and/or as BLOOD THINNER         . COMBIVENT RESPIMAT 20-100 MCG/ACT IN AERS   Oral   Take 1 puff by mouth every 6 (six) hours as needed.          Marland Kitchen HYDROCODONE-ACETAMINOPHEN 5-325 MG PO TABS   Oral   Take 1 tablet by mouth 4 (four) times daily as needed. Pain.         Marland Kitchen PREDNISONE 10 MG PO TABS   Oral   Take 2 tablets (20 mg total) by mouth daily.   14 tablet   0     BP 119/70  Pulse 97  Temp 98.2 F (36.8 C)  Resp 24  Wt 124 lb (56.246 kg)  SpO2 95%  Physical Exam  Nursing note and vitals reviewed. Constitutional:       Awake, alert, nontoxic appearance.  HENT:  Head: Atraumatic.  Eyes: Right eye exhibits no discharge. Left eye exhibits no discharge.  Neck: Neck supple.  Cardiovascular: Normal rate, regular  rhythm and normal heart sounds.   Pulmonary/Chest: Effort normal. He has wheezes (scattered bilateral). He exhibits no tenderness.  Abdominal: Soft. There is no tenderness. There is no rebound.  Musculoskeletal: He exhibits no tenderness.       Baseline ROM, no obvious new focal weakness.  Neurological:       Mental status and motor strength appears baseline for patient and situation.  Skin: No rash noted.  Psychiatric: He has a normal mood and affect.    ED Course  Procedures (including critical care time)  DIAGNOSTIC STUDIES: Oxygen Saturation is 91% on room air, low by my interpretation.    COORDINATION OF CARE:  9:28PM - breathing treatment will be ordered for [name]. 10:13PM - [name] is feeling better after treatment. He is ready for d/c.   Nursing notes, applicable records and vitals reviewed.  Radiologic Images/Reports reviewed.     1. Asthma       MDM  Recurrent asthma, with medical noncompliance. Patient is in his usual state of health. Doubt pneumonia, ACS, or PE. He is stable for discharge  I personally performed the services described in this documentation, which was scribed in my presence. The recorded information has been reviewed and is accurate.      Plan: Home Medications- usual; Home Treatments- rest; Recommended follow up- PCP, when necessary     Flint Melter, MD 09/29/12 [phone]

## 2012-09-28 NOTE — ED Notes (Addendum)
Pt states he is not giving any blood for testing & he wants one of those puffer things. (MDI)

## 2012-09-28 NOTE — ED Notes (Addendum)
Pt c/o sob x 2 hrs, no relief with inhaler. Pt states he is about out of his inhaler. Pt refusing for [place] to take blood, only wants an inhaler and oxygen, and breathing treatment.

## 2012-09-28 NOTE — ED Notes (Signed)
Pt states his ride will be here after 0000.

## 2012-09-28 NOTE — ED Notes (Signed)
Spoke with pt family & they state they are not coming to pick pt up.  that pt has been told to stop coming to the ER. When asked about pt son who getting off work was told he not coming either.

## 2012-10-05 ENCOUNTER — Encounter (HOSPITAL_COMMUNITY): Payer: Self-pay | Admitting: *Deleted

## 2012-10-05 ENCOUNTER — Emergency Department (HOSPITAL_COMMUNITY): Payer: PRIVATE HEALTH INSURANCE

## 2012-10-05 ENCOUNTER — Emergency Department (HOSPITAL_COMMUNITY)
Admission: EM | Admit: 2012-10-05 | Discharge: 2012-10-05 | Disposition: A | Discharge status: Home / Self Care | Payer: PRIVATE HEALTH INSURANCE | Attending: Emergency Medicine | Admitting: Emergency Medicine

## 2012-10-05 DIAGNOSIS — I509 Heart failure, unspecified: Secondary | ICD-10-CM | POA: Insufficient documentation

## 2012-10-05 DIAGNOSIS — Z79899 Other long term (current) drug therapy: Secondary | ICD-10-CM | POA: Insufficient documentation

## 2012-10-05 DIAGNOSIS — R0989 Other specified symptoms and signs involving the circulatory and respiratory systems: Secondary | ICD-10-CM | POA: Insufficient documentation

## 2012-10-05 DIAGNOSIS — Q2733 Arteriovenous malformation of digestive system vessel: Secondary | ICD-10-CM | POA: Insufficient documentation

## 2012-10-05 DIAGNOSIS — I10 Essential (primary) hypertension: Secondary | ICD-10-CM | POA: Insufficient documentation

## 2012-10-05 DIAGNOSIS — Z8659 Personal history of other mental and behavioral disorders: Secondary | ICD-10-CM | POA: Insufficient documentation

## 2012-10-05 DIAGNOSIS — Z862 Personal history of diseases of the blood and blood-forming organs and certain disorders involving the immune mechanism: Secondary | ICD-10-CM | POA: Insufficient documentation

## 2012-10-05 DIAGNOSIS — Z7982 Long term (current) use of aspirin: Secondary | ICD-10-CM | POA: Insufficient documentation

## 2012-10-05 DIAGNOSIS — Z8709 Personal history of other diseases of the respiratory system: Secondary | ICD-10-CM | POA: Insufficient documentation

## 2012-10-05 DIAGNOSIS — Z87891 Personal history of nicotine dependence: Secondary | ICD-10-CM | POA: Insufficient documentation

## 2012-10-05 DIAGNOSIS — F1010 Alcohol abuse, uncomplicated: Secondary | ICD-10-CM | POA: Insufficient documentation

## 2012-10-05 DIAGNOSIS — R0600 Dyspnea, unspecified: Secondary | ICD-10-CM

## 2012-10-05 DIAGNOSIS — J441 Chronic obstructive pulmonary disease with (acute) exacerbation: Secondary | ICD-10-CM

## 2012-10-05 DIAGNOSIS — J4489 Other specified chronic obstructive pulmonary disease: Secondary | ICD-10-CM | POA: Insufficient documentation

## 2012-10-05 DIAGNOSIS — R0609 Other forms of dyspnea: Secondary | ICD-10-CM | POA: Insufficient documentation

## 2012-10-05 DIAGNOSIS — Z8719 Personal history of other diseases of the digestive system: Secondary | ICD-10-CM | POA: Insufficient documentation

## 2012-10-05 DIAGNOSIS — F259 Schizoaffective disorder, unspecified: Secondary | ICD-10-CM | POA: Insufficient documentation

## 2012-10-05 DIAGNOSIS — J449 Chronic obstructive pulmonary disease, unspecified: Secondary | ICD-10-CM | POA: Insufficient documentation

## 2012-10-05 LAB — CBC WITH DIFFERENTIAL/PLATELET
Basophils Relative: 1 % (ref 0–1)
Hemoglobin: 10.5 g/dL — ABNORMAL LOW (ref 13.0–17.0)
Lymphs Abs: 0.5 10*3/uL — ABNORMAL LOW (ref 0.7–4.0)
Monocytes Relative: 13 % — ABNORMAL HIGH (ref 3–12)
Neutro Abs: 3.2 10*3/uL (ref 1.7–7.7)
Neutrophils Relative %: 67 % (ref 43–77)
RBC: 4.38 MIL/uL (ref 4.22–5.81)
WBC: 4.7 10*3/uL (ref 4.0–10.5)

## 2012-10-05 LAB — BASIC METABOLIC PANEL
BUN: 10 mg/dL (ref 6–23)
Chloride: 103 mEq/L (ref 96–112)
GFR calc Af Amer: >90 mL/min (ref >90)
Potassium: 3.8 mEq/L (ref 3.5–5.1)

## 2012-10-05 MED ORDER — IPRATROPIUM BROMIDE 0.02 % IN SOLN
0.5000 mg | Freq: Once | RESPIRATORY_TRACT | Status: AC
  Administered 2012-10-05: 0.5 mg via RESPIRATORY_TRACT
  Filled 2012-10-05: qty 2.5

## 2012-10-05 MED ORDER — METHYLPREDNISOLONE SODIUM SUCC 125 MG IJ SOLR
125.0000 mg | Freq: Once | INTRAMUSCULAR | Status: AC
  Administered 2012-10-05: 125 mg via INTRAVENOUS
  Filled 2012-10-05: qty 2

## 2012-10-05 MED ORDER — ALBUTEROL SULFATE (5 MG/ML) 0.5% IN NEBU
2.5000 mg | INHALATION_SOLUTION | Freq: Once | RESPIRATORY_TRACT | Status: AC
  Administered 2012-10-05: 2.5 mg via RESPIRATORY_TRACT
  Filled 2012-10-05: qty 0.5

## 2012-10-05 MED ORDER — ALBUTEROL SULFATE HFA 108 (90 BASE) MCG/ACT IN AERS
INHALATION_SPRAY | RESPIRATORY_TRACT | Status: AC
  Administered 2012-10-05: 23:00:00
  Filled 2012-10-05: qty 6.7

## 2012-10-05 MED ORDER — PREDNISONE 50 MG PO TABS: 50.0000 mg | ORAL_TABLET | Freq: Every day | ORAL | Status: DC

## 2012-10-05 MED ORDER — FUROSEMIDE 20 MG PO TABS: 20.0000 mg | ORAL_TABLET | Freq: Every day | ORAL | Status: DC

## 2012-10-05 MED ORDER — FUROSEMIDE 10 MG/ML IJ SOLN
40.0000 mg | Freq: Once | INTRAMUSCULAR | Status: AC
  Administered 2012-10-05: 40 mg via INTRAMUSCULAR
  Filled 2012-10-05: qty 4

## 2012-10-05 NOTE — ED Provider Notes (Addendum)
History  This chart was scribed for Dione Booze, MD by Shari Heritage, ED Scribe. The patient was seen in room APA12/APA12. Patient's care was started at 29.  CSN: [id number]  Arrival date & time 10/05/12  [name]   First MD Initiated Contact with Patient 10/05/12 1913      Chief Complaint  Patient presents with  . Shortness of Breath    The history is provided by the patient. No language interpreter was used.    HPI Comments: [name] is a 78 y.o. male with a medical history of COPD and HTN who presents to the Emergency Department complaining of moderate, persistent shortness of breath that began less than 1 hour ago. Patient says the onset was sudden. Patient states that SOB is worse with walking and exertion. Patient uses an inhaler as needed, but he states that it did not provide any relief today. Patient denies cough, nausea, vomiting or diaphoresis. Patient has been seen in the ED and hospitalized multiple times for the same complaint. Other medical history includes AV malformation of GI tract and pulmonary lesion.   Past Medical History  Diagnosis Date  . COPD (chronic obstructive pulmonary disease)   . Anxiety   . Alcohol abuse   . Anemia   . Generalized headaches   . Hypertension   . 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 Pulmonary nodule/lesion, solitary   . Schizoaffective disorder   . GI bleed     History of recurrent bleeding that dates back as far as 2004 per records,    Past Surgical History  Procedure Date  . Colonoscopy Sept 2009    SLF: few small AVMs in ascending colon distal to IC . Ablated via BICAP.   Marland Kitchen Esophagogastroduodenoscopy Sept 2009    SLF: normal esophagus, small HH, 1-2 AVMs actively oozing in mid stomach, s/p BICAP, path benign   . Esophagogastroduodenoscopy 01/18/2012    Rourk-Erosive reflux esophagitis. Noncritical ring. Antral erosions/ small antral ulcer - appeared innocent  (H  pylori serrology negative)  . Colonoscopy 03/09/2012    Procedure: COLONOSCOPY;  Surgeon: West Bali, MD;  Location: [facility];  Service: Endoscopy;  Laterality: N/A;  2:30  . Esophagogastroduodenoscopy 03/09/2012    Procedure: ESOPHAGOGASTRODUODENOSCOPY (EGD);  Surgeon: West Bali, MD;  Location: [facility];  Service: Endoscopy;  Laterality: N/A;    Family History  Problem Relation Age of Onset  . Colon cancer Neg Hx     History  Substance Use Topics  . Smoking status: Former Smoker    Types: Cigarettes    Quit date: 05/07/1969  . Smokeless tobacco: Current User    Types: Snuff  . Alcohol Use: No      Review of Systems  Constitutional: Negative for diaphoresis.  Respiratory: Positive for shortness of breath. Negative for cough.   Cardiovascular: Negative for chest pain.  Gastrointestinal: Negative for nausea and vomiting.  All other systems reviewed and are negative.    Allergies  Black pepper  Home Medications   Current Outpatient Rx  Name  Route  Sig  Dispense  Refill  . ALBUTEROL SULFATE HFA 108 (90 BASE) MCG/ACT IN AERS   Inhalation   Inhale 2 puffs into the lungs every 4 (four) hours as needed for wheezing.   1 Inhaler   3   . AMOXICILLIN-POT CLAVULANATE 875-125 MG PO TABS   Oral   Take 1 tablet  by mouth 2 (two) times daily. For 10 days         . ASPIRIN EC 81 MG PO TBEC   Oral   Take 81 mg by mouth daily as needed. For pain and/or as BLOOD THINNER         . COMBIVENT RESPIMAT 20-100 MCG/ACT IN AERS   Oral   Take 1 puff by mouth every 6 (six) hours as needed.          Marland Kitchen HYDROCODONE-ACETAMINOPHEN 5-325 MG PO TABS   Oral   Take 1 tablet by mouth 4 (four) times daily as needed. Pain.         Marland Kitchen PREDNISONE 10 MG PO TABS   Oral   Take 2 tablets (20 mg total) by mouth daily.   14 tablet   0     Triage Vitals: BP 146/80  Pulse 108  Temp 98 F (36.7 C) (Oral)  Resp 22  Ht 5\' 4"  (1.626 m)  Wt 125 lb (56.7 kg)  BMI 21.46  kg/m2  SpO2 92%  Physical Exam  Nursing note and vitals reviewed. Constitutional: He is oriented to person, place, and time. He appears well-developed and well-nourished. No distress.  HENT:  Head: Normocephalic and atraumatic.  Eyes: Conjunctivae normal and EOM are normal. Pupils are equal, round, and reactive to light. Right eye exhibits no discharge. Left eye exhibits no discharge.  Neck: Neck supple.  Cardiovascular: Normal rate, regular rhythm and normal heart sounds.  Exam reveals no gallop and no friction rub.   No murmur heard. Pulmonary/Chest: Effort normal. He has no wheezes. He has rales in the right lower field.  Abdominal: He exhibits no distension.  Musculoskeletal: He exhibits no edema and no tenderness.  Neurological: He is alert and oriented to person, place, and time.  Skin: Skin is warm and dry. No rash noted.  Psychiatric: He has a normal mood and affect. His behavior is normal. Thought content normal.    ED Course  Procedures (including critical care time) DIAGNOSTIC STUDIES: Oxygen Saturation is 92% on room air, low by my interpretation.    COORDINATION OF CARE: 7:22 PM- Patient informed of current plan for treatment and evaluation and agrees with plan at this time.    Results for orders placed during the hospital encounter of 10/05/12  CBC WITH DIFFERENTIAL      Component Value Range   WBC 4.7  4.0 - 10.5 K/uL   RBC 4.38  4.22 - 5.81 MIL/uL   Hemoglobin 10.5 (*) 13.0 - 17.0 g/dL   HCT 16.1 (*) 09.6 - 04.5 %   MCV 78.5  78.0 - 100.0 fL   MCH 24.0 (*) 26.0 - 34.0 pg   MCHC 30.5  30.0 - 36.0 g/dL   RDW 40.9 (*) 81.1 - 91.4 %   Platelets 503 (*) 150 - 400 K/uL   Neutrophils Relative 67  43 - 77 %   Neutro Abs 3.2  1.7 - 7.7 K/uL   Lymphocytes Relative 11 (*) 12 - 46 %   Lymphs Abs 0.5 (*) 0.7 - 4.0 K/uL   Monocytes Relative 13 (*) 3 - 12 %   Monocytes Absolute 0.6  0.1 - 1.0 K/uL   Eosinophils Relative 7 (*) 0 - 5 %   Eosinophils Absolute 0.4  0.0 -  0.7 K/uL   Basophils Relative 1  0 - 1 %   Basophils Absolute 0.0  0.0 - 0.1 K/uL  BASIC METABOLIC PANEL  Component Value Range   Sodium 139  135 - 145 mEq/L   Potassium 3.8  3.5 - 5.1 mEq/L   Chloride 103  96 - 112 mEq/L   CO2 26  19 - 32 mEq/L   Glucose, Bld 135 (*) 70 - 99 mg/dL   BUN 10  6 - 23 mg/dL   Creatinine, Ser 4.16  0.50 - 1.35 mg/dL   Calcium 9.2  8.4 - 60.6 mg/dL   GFR calc non Af Amer 80 (*) >90 mL/min   GFR calc Af Amer >90  >90 mL/min  TROPONIN I      Component Value Range   Troponin I <0.30  <0.30 ng/mL  PRO B NATRIURETIC PEPTIDE      Component Value Range   Pro B Natriuretic peptide (BNP) 642.7 (*) 0 - 450 pg/mL     Dg Chest 2 View  10/05/2012  *RADIOLOGY REPORT*  Clinical Data: Shortness of breath.  History of COPD and hypertension.  CHEST - 2 VIEW  Comparison: 09/18/2012.  Findings: The heart remains normal in size.  The interstitial markings remain mildly prominent with hyper lucency in the right lower lung zone.  The bones appear osteopenic.  Thoracic spine degenerative changes.  IMPRESSION: Stable changes of COPD.  No acute abnormality.   Original Report Authenticated By: Beckie Salts, M.D.    ECG shows normal sinus rhythm with a rate of 96, no ectopy. Normal axis. Normal P wave. Normal QRS. Normal intervals. Normal ST and T waves. Impression: normal ECG. When compared with ECG of 09/18/2012, no significant changes are seen.   1. Dyspnea   2. COPD exacerbation   3. CHF (congestive heart failure)       MDM  And acute dyspnea in patient with a long history of COPD. Old records are reviewed, and he has multiple ED visits for his COPD. However, he does have rales at the right base I am concerned about possible congestive heart failure so BNP will be checked as well as chest x-ray. He'll be given albuterol with Atrovent and reassessed.  He had transient relief with albuterol and Atrovent. Albuterol and Atrovent are repeated. BMP is come back moderately  elevated and he'll be given a dose of Lasix and also be given a dose of methylprednisolone.  He feels much better after the above but treatment. He is sent home with prescription for prednisone and also a short course of furosemide and is to followup with his PCP.    I personally performed the services described in this documentation, which was scribed in my presence. The recorded information has been reviewed and is accurate.      Dione Booze, MD 10/05/12 2230  Dione Booze, MD 10/06/12 [phone]

## 2012-10-05 NOTE — ED Notes (Signed)
Pt alert & oriented x4, stable gait. Patient given discharge instructions, paperwork & prescription(s). Patient  instructed to stop at the registration desk to finish any additional paperwork. Patient verbalized understanding. Pt left department w/ no further questions. 

## 2012-10-05 NOTE — ED Notes (Signed)
Pt complaining of being SOB. Pt has not seen PCP in a while.

## 2012-10-08 ENCOUNTER — Encounter (HOSPITAL_COMMUNITY): Payer: Self-pay | Admitting: *Deleted

## 2012-10-08 ENCOUNTER — Emergency Department (HOSPITAL_COMMUNITY)
Admission: EM | Admit: 2012-10-08 | Discharge: 2012-10-08 | Disposition: A | Discharge status: Home / Self Care | Payer: PRIVATE HEALTH INSURANCE | Attending: Emergency Medicine | Admitting: Emergency Medicine

## 2012-10-08 DIAGNOSIS — F259 Schizoaffective disorder, unspecified: Secondary | ICD-10-CM | POA: Insufficient documentation

## 2012-10-08 DIAGNOSIS — Z87891 Personal history of nicotine dependence: Secondary | ICD-10-CM | POA: Insufficient documentation

## 2012-10-08 DIAGNOSIS — J4489 Other specified chronic obstructive pulmonary disease: Secondary | ICD-10-CM | POA: Insufficient documentation

## 2012-10-08 DIAGNOSIS — Z79899 Other long term (current) drug therapy: Secondary | ICD-10-CM | POA: Insufficient documentation

## 2012-10-08 DIAGNOSIS — F411 Generalized anxiety disorder: Secondary | ICD-10-CM | POA: Insufficient documentation

## 2012-10-08 DIAGNOSIS — J449 Chronic obstructive pulmonary disease, unspecified: Secondary | ICD-10-CM | POA: Insufficient documentation

## 2012-10-08 DIAGNOSIS — Z8719 Personal history of other diseases of the digestive system: Secondary | ICD-10-CM | POA: Insufficient documentation

## 2012-10-08 DIAGNOSIS — D649 Anemia, unspecified: Secondary | ICD-10-CM | POA: Insufficient documentation

## 2012-10-08 DIAGNOSIS — I10 Essential (primary) hypertension: Secondary | ICD-10-CM | POA: Insufficient documentation

## 2012-10-08 MED ORDER — ALBUTEROL SULFATE HFA 108 (90 BASE) MCG/ACT IN AERS: 2.0000 | INHALATION_SPRAY | RESPIRATORY_TRACT | Status: DC | PRN

## 2012-10-08 MED ORDER — IPRATROPIUM BROMIDE 0.02 % IN SOLN
0.5000 mg | Freq: Once | RESPIRATORY_TRACT | Status: AC
  Administered 2012-10-08: 0.5 mg via RESPIRATORY_TRACT
  Filled 2012-10-08: qty 2.5

## 2012-10-08 MED ORDER — ALBUTEROL SULFATE (5 MG/ML) 0.5% IN NEBU
5.0000 mg | INHALATION_SOLUTION | Freq: Once | RESPIRATORY_TRACT | Status: AC
  Administered 2012-10-08: 5 mg via RESPIRATORY_TRACT
  Filled 2012-10-08: qty 1

## 2012-10-08 MED ORDER — PREDNISONE 10 MG PO TABS: 20.0000 mg | ORAL_TABLET | Freq: Every day | ORAL | Status: DC

## 2012-10-08 MED ORDER — PREDNISONE 20 MG PO TABS
20.0000 mg | ORAL_TABLET | Freq: Once | ORAL | Status: AC
  Administered 2012-10-08: 20 mg via ORAL
  Filled 2012-10-08: qty 1

## 2012-10-08 NOTE — ED Notes (Signed)
Sob for 1 hour,no cough,

## 2012-10-08 NOTE — ED Notes (Signed)
RT informed of breathing treatment.

## 2012-10-08 NOTE — ED Provider Notes (Signed)
History  This chart was scribed for Donnetta Hutching, MD by Bennett Scrape, ED Scribe. This patient was seen in room APA15/APA15 and the patient's care was started at 4:09 PM.  CSN: [id number]  Arrival date & time 10/08/12  1544   First MD Initiated Contact with Patient 10/08/12 1609      Chief Complaint  Patient presents with  . Shortness of Breath     The history is provided by the patient. No language interpreter was used.   [name] is a 78 y.o. male with a h/o COPD and asthma who presents to the Emergency Department complaining of one hour of gradual onset, gradually worsening, constant SOB that he rates as moderate and worse with exertion. He reports that he has 3 inhalers, a Combivent 20-100 ( a combo of albuterol and bronchodilator) and 2 albuterol, at home that he used with no improvement. He denies being on prednisone currently. He denies cough, diaphoresis, nausea or CP as associated symptoms. He also has a h/o anxiety, anemia, HTN and schizoaffective disorder. Pt is a former smoker but denies alcohol use.  Dr. Juanetta Gosling is PCP.  Pt is well known to me and has been seen in this ED for the same complaint numerous times, the last visit being 3 days ago (on 10/05/12). He has prior admissions for COPD exacerbation but no recent admissions. I suspect that the pt uses these visits as more of a social call than for actual medical treatment.  Past Medical History  Diagnosis Date  . COPD (chronic obstructive pulmonary disease)   . Anxiety   . Alcohol abuse   . Anemia   . Generalized headaches   . Hypertension   . 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 Pulmonary nodule/lesion, solitary   . Schizoaffective disorder   . GI bleed     History of recurrent bleeding that dates back as far as 2004 per records,    Past Surgical History  Procedure Date  . Colonoscopy Sept 2009    SLF: few small AVMs in ascending colon distal to  IC . Ablated via BICAP.   Marland Kitchen Esophagogastroduodenoscopy Sept 2009    SLF: normal esophagus, small HH, 1-2 AVMs actively oozing in mid stomach, s/p BICAP, path benign   . Esophagogastroduodenoscopy 01/18/2012    Rourk-Erosive reflux esophagitis. Noncritical ring. Antral erosions/ small antral ulcer - appeared innocent  (H pylori serrology negative)  . Colonoscopy 03/09/2012    Procedure: COLONOSCOPY;  Surgeon: West Bali, MD;  [facility]: [facility];  Service: Endoscopy;  Laterality: N/A;  2:30  . Esophagogastroduodenoscopy 03/09/2012    Procedure: ESOPHAGOGASTRODUODENOSCOPY (EGD);  Surgeon: West Bali, MD;  Location: [facility];  Service: Endoscopy;  Laterality: N/A;    Family History  Problem Relation Age of Onset  . Colon cancer Neg Hx     History  Substance Use Topics  . Smoking status: Former Smoker    Types: Cigarettes    Quit date: 05/07/1969  . Smokeless tobacco: Current User    Types: Snuff  . Alcohol Use: No      Review of Systems  A complete 10 system review of systems was obtained and all systems are negative except as noted in the HPI and PMH.    Allergies  Black pepper  Home Medications   Current Outpatient Rx  Name  Route  Sig  Dispense  Refill  . ALBUTEROL  SULFATE HFA 108 (90 BASE) MCG/ACT IN AERS   Inhalation   Inhale 2 puffs into the lungs every 6 (six) hours as needed. For shortness of breath         . ALBUTEROL SULFATE HFA 108 (90 BASE) MCG/ACT IN AERS   Inhalation   Inhale 2 puffs into the lungs every 4 (four) hours as needed for wheezing.   1 Inhaler   3   . ASPIRIN EC 81 MG PO TBEC   Oral   Take 81 mg by mouth daily as needed. For pain and/or as BLOOD THINNER         . COMBIVENT RESPIMAT 20-100 MCG/ACT IN AERS   Oral   Take 1 puff by mouth every 6 (six) hours as needed.          Marland Kitchen HYDROCODONE-ACETAMINOPHEN 5-325 MG PO TABS   Oral   Take 1 tablet by mouth 4 (four) times daily as needed. Pain.         . FUROSEMIDE 20 MG  PO TABS   Oral   Take 1 tablet (20 mg total) by mouth daily.   5 tablet   0   . PREDNISONE 50 MG PO TABS   Oral   Take 1 tablet (50 mg total) by mouth daily.   5 tablet   0     Triage Vitals: BP 107/91  Pulse 97  Temp 97.5 F (36.4 C) (Oral)  Resp 22  Ht 5\' 4"  (1.626 m)  Wt 125 lb (56.7 kg)  BMI 21.46 kg/m2  SpO2 92%  Physical Exam  Nursing note and vitals reviewed. Constitutional: He is oriented to person, place, and time. He appears well-developed and well-nourished.  HENT:  Head: Normocephalic and atraumatic.  Eyes: Conjunctivae normal and EOM are normal. Pupils are equal, round, and reactive to light.  Neck: Normal range of motion. Neck supple.  Cardiovascular: Normal rate, regular rhythm and normal heart sounds.   Pulmonary/Chest: Effort normal and breath sounds normal. No respiratory distress. He has no wheezes. He has no rales. He exhibits no tenderness.  Abdominal: Soft. Bowel sounds are normal.  Musculoskeletal: Normal range of motion.  Neurological: He is alert and oriented to person, place, and time.  Skin: Skin is warm and dry.  Psychiatric: He has a normal mood and affect.    ED Course  Procedures (including critical care time)  DIAGNOSTIC STUDIES: Oxygen Saturation is 92% on room air, adequate by my interpretation.    COORDINATION OF CARE: 4:34 PM-Discussed treatment plan which includes a breathing treatment with pt at bedside and pt agreed to plan.  4:45 PM- Ordered 20 mg prednisone tablet, 5 mg of 0.5% albuterol nebulizer solution and 0.5 mg of Atrovent solution.  5:55 PM-Prescribed an albuterol inhaler and 10 mg prednisone tablets.  Labs Reviewed - No data to display No results found.   No diagnosis found.    MDM  Regular visitor to the emergency department with COPD-related issues.  Patient feeling better after breathing treatment.  We'll start prednisone      I personally performed the services described in this documentation,  which was scribed in my presence. The recorded information has been reviewed and is accurate.    Donnetta Hutching, MD 10/11/12 [phone]

## 2012-10-13 ENCOUNTER — Emergency Department (HOSPITAL_COMMUNITY)
Admission: EM | Admit: 2012-10-13 | Discharge: 2012-10-13 | Disposition: A | Discharge status: Home / Self Care | Payer: PRIVATE HEALTH INSURANCE | Attending: Emergency Medicine | Admitting: Emergency Medicine

## 2012-10-13 ENCOUNTER — Encounter (HOSPITAL_COMMUNITY): Payer: Self-pay | Admitting: *Deleted

## 2012-10-13 DIAGNOSIS — J449 Chronic obstructive pulmonary disease, unspecified: Secondary | ICD-10-CM | POA: Insufficient documentation

## 2012-10-13 DIAGNOSIS — Z8659 Personal history of other mental and behavioral disorders: Secondary | ICD-10-CM | POA: Insufficient documentation

## 2012-10-13 DIAGNOSIS — Z8709 Personal history of other diseases of the respiratory system: Secondary | ICD-10-CM | POA: Insufficient documentation

## 2012-10-13 DIAGNOSIS — Z87891 Personal history of nicotine dependence: Secondary | ICD-10-CM | POA: Insufficient documentation

## 2012-10-13 DIAGNOSIS — K921 Melena: Secondary | ICD-10-CM | POA: Insufficient documentation

## 2012-10-13 DIAGNOSIS — I10 Essential (primary) hypertension: Secondary | ICD-10-CM | POA: Insufficient documentation

## 2012-10-13 DIAGNOSIS — F1021 Alcohol dependence, in remission: Secondary | ICD-10-CM | POA: Insufficient documentation

## 2012-10-13 DIAGNOSIS — Z79899 Other long term (current) drug therapy: Secondary | ICD-10-CM | POA: Insufficient documentation

## 2012-10-13 DIAGNOSIS — Z8679 Personal history of other diseases of the circulatory system: Secondary | ICD-10-CM | POA: Insufficient documentation

## 2012-10-13 DIAGNOSIS — Z862 Personal history of diseases of the blood and blood-forming organs and certain disorders involving the immune mechanism: Secondary | ICD-10-CM | POA: Insufficient documentation

## 2012-10-13 DIAGNOSIS — J4489 Other specified chronic obstructive pulmonary disease: Secondary | ICD-10-CM | POA: Insufficient documentation

## 2012-10-13 DIAGNOSIS — Z8719 Personal history of other diseases of the digestive system: Secondary | ICD-10-CM | POA: Insufficient documentation

## 2012-10-13 MED ORDER — ALBUTEROL SULFATE (5 MG/ML) 0.5% IN NEBU
2.5000 mg | INHALATION_SOLUTION | Freq: Once | RESPIRATORY_TRACT | Status: AC
  Administered 2012-10-13: 2.5 mg via RESPIRATORY_TRACT
  Filled 2012-10-13: qty 0.5

## 2012-10-13 NOTE — ED Notes (Signed)
Pt c/o sob. Pt states he has struggled with his breathing for a long time. Pt states this episode began this past pm.

## 2012-10-13 NOTE — ED Provider Notes (Signed)
History   This chart was scribed for [employer]. Rubin Payor, MD by Leone Payor, ED Scribe. This patient was seen in room APA17/APA17 and the patient's care was started at 1945.   CSN: [id number]  Arrival date & time 10/13/12  1905   First MD Initiated Contact with Patient 10/13/12 1945      Chief Complaint  Patient presents with  . Shortness of Breath     The history is provided by the patient. No language interpreter was used.    [name] is a 78 y.o. male with a h/o COPD who presents to the Emergency Department complaining of frequent  SOB with last episode starting this evening with gradual onset. Pt states he has struggled with his breathing for a long time. He reports slight bleeding in the stool and denies swelling or chest pains. Pt denies HA, fever, neck pain, sore throat, rash, back pain, abdominal pain, nausea, emesis, diarrhea, dysuria, or extremity pain, edema, weakness, numbness, or tingling. No known allergies. No other pertinent medical symptoms.   Pt has h/o HTN, anemia, GI bleed, anxiety, alcohol abuse.  Pt is a former smoker but denies alcohol use. Past Medical History  Diagnosis Date  . COPD (chronic obstructive pulmonary disease)   . Anxiety   . Alcohol abuse   . Anemia   . Generalized headaches   . Hypertension   . 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 Pulmonary nodule/lesion, solitary   . Schizoaffective disorder   . GI bleed     History of recurrent bleeding that dates back as far as 2004 per records,    Past Surgical History  Procedure Date  . Colonoscopy Sept 2009    SLF: few small AVMs in ascending colon distal to IC . Ablated via BICAP.   Marland Kitchen Esophagogastroduodenoscopy Sept 2009    SLF: normal esophagus, small HH, 1-2 AVMs actively oozing in mid stomach, s/p BICAP, path benign   . Esophagogastroduodenoscopy 01/18/2012    Rourk-Erosive reflux esophagitis. Noncritical ring. Antral  erosions/ small antral ulcer - appeared innocent  (H pylori serrology negative)  . Colonoscopy 03/09/2012    Procedure: COLONOSCOPY;  Surgeon: West Bali, MD;  Location: [facility];  Service: Endoscopy;  Laterality: N/A;  2:30  . Esophagogastroduodenoscopy 03/09/2012    Procedure: ESOPHAGOGASTRODUODENOSCOPY (EGD);  Surgeon: West Bali, MD;  [facility]: [facility];  Service: Endoscopy;  Laterality: N/A;    Family History  Problem Relation Age of Onset  . Colon cancer Neg Hx     History  Substance Use Topics  . Smoking status: Former Smoker    Types: Cigarettes    Quit date: 05/07/1969  . Smokeless tobacco: Current User    Types: Snuff  . Alcohol Use: No      Review of Systems  Constitutional: Negative.   HENT: Negative.   Respiratory: Positive for shortness of breath.   Cardiovascular: Negative.  Negative for chest pain.  Gastrointestinal: Positive for blood in stool.  Musculoskeletal: Negative.   Skin: Negative.   Neurological: Negative.   Hematological: Negative.   Psychiatric/Behavioral: Negative.     Allergies  Black pepper  Home Medications   Current Outpatient Rx  Name  Route  Sig  Dispense  Refill  . ALBUTEROL SULFATE HFA 108 (90 BASE) MCG/ACT IN AERS   Inhalation   Inhale 2 puffs into the lungs every 6 (six) hours as needed. Shortness  of Breath         . DIAZEPAM 5 MG PO TABS   Oral   Take 5 mg by mouth 3 (three) times daily.         Marland Kitchen HYDROCODONE-ACETAMINOPHEN 5-325 MG PO TABS   Oral   Take 1 tablet by mouth 4 (four) times daily as needed. Pain.           BP 106/61  Pulse 92  Temp 98 F (36.7 C) (Oral)  Resp 20  Ht 5\' 5"  (1.651 m)  Wt 130 lb (58.968 kg)  BMI 21.63 kg/m2  SpO2 100%  Physical Exam  Nursing note and vitals reviewed. Constitutional: He appears well-developed and well-nourished.  HENT:  Head: Normocephalic and atraumatic.  Eyes: Conjunctivae normal are normal. Pupils are equal, round, and reactive to light.   Neck: Neck supple. No tracheal deviation present. No thyromegaly present.  Cardiovascular: Normal rate and regular rhythm.   No murmur heard. Pulmonary/Chest: Effort normal. He has wheezes.  Abdominal: Soft. Bowel sounds are normal. He exhibits no distension. There is no tenderness.  Musculoskeletal: Normal range of motion. He exhibits no edema and no tenderness.       No swelling.   Neurological: He is alert. Coordination normal.  Skin: Skin is warm and dry. No rash noted.  Psychiatric: He has a normal mood and affect.    ED Course  Procedures (including critical care time)  COORDINATION OF CARE:  8:02 PM  Discussed treatment plan which includes breathing treatment with pt at bedside and pt agreed to plan.     Labs Reviewed - No data to display No results found.   1. COPD (chronic obstructive pulmonary disease)       MDM  Patient with shortness of breath. History of same. This is his 50th visit in the last 6 months. He also reportedly goes to his primary care Dr. frequently. Minimal wheezing on examination. He was ambulated and was not hypoxic. He is no localizing lung findings. At this point I doubt a pneumonia or CHF. I also doubt a severe anemia. Patient feels what better after breathing treatments will be discharged home.   I personally performed the services described in this documentation, which was scribed in my presence. The recorded information has been reviewed and is accurate.     Juliet Rude. Rubin Payor, MD 10/13/12 2310

## 2012-10-13 NOTE — ED Notes (Signed)
Walked in patient 's room and he was taking his own meds of lortab and prednisone.

## 2012-10-14 ENCOUNTER — Encounter (HOSPITAL_COMMUNITY): Payer: Self-pay | Admitting: Emergency Medicine

## 2012-10-14 ENCOUNTER — Emergency Department (HOSPITAL_COMMUNITY)
Admission: EM | Admit: 2012-10-14 | Discharge: 2012-10-15 | Disposition: A | Discharge status: Home / Self Care | Payer: PRIVATE HEALTH INSURANCE | Attending: Emergency Medicine | Admitting: Emergency Medicine

## 2012-10-14 DIAGNOSIS — Z862 Personal history of diseases of the blood and blood-forming organs and certain disorders involving the immune mechanism: Secondary | ICD-10-CM | POA: Insufficient documentation

## 2012-10-14 DIAGNOSIS — Q2733 Arteriovenous malformation of digestive system vessel: Secondary | ICD-10-CM | POA: Insufficient documentation

## 2012-10-14 DIAGNOSIS — R062 Wheezing: Secondary | ICD-10-CM | POA: Insufficient documentation

## 2012-10-14 DIAGNOSIS — R911 Solitary pulmonary nodule: Secondary | ICD-10-CM | POA: Insufficient documentation

## 2012-10-14 DIAGNOSIS — F259 Schizoaffective disorder, unspecified: Secondary | ICD-10-CM | POA: Insufficient documentation

## 2012-10-14 DIAGNOSIS — Z9889 Other specified postprocedural states: Secondary | ICD-10-CM | POA: Insufficient documentation

## 2012-10-14 DIAGNOSIS — Z8679 Personal history of other diseases of the circulatory system: Secondary | ICD-10-CM | POA: Insufficient documentation

## 2012-10-14 DIAGNOSIS — F1010 Alcohol abuse, uncomplicated: Secondary | ICD-10-CM | POA: Insufficient documentation

## 2012-10-14 DIAGNOSIS — Z8719 Personal history of other diseases of the digestive system: Secondary | ICD-10-CM | POA: Insufficient documentation

## 2012-10-14 DIAGNOSIS — I10 Essential (primary) hypertension: Secondary | ICD-10-CM | POA: Insufficient documentation

## 2012-10-14 DIAGNOSIS — J4489 Other specified chronic obstructive pulmonary disease: Secondary | ICD-10-CM | POA: Insufficient documentation

## 2012-10-14 DIAGNOSIS — Z79899 Other long term (current) drug therapy: Secondary | ICD-10-CM | POA: Insufficient documentation

## 2012-10-14 DIAGNOSIS — F411 Generalized anxiety disorder: Secondary | ICD-10-CM | POA: Insufficient documentation

## 2012-10-14 DIAGNOSIS — J449 Chronic obstructive pulmonary disease, unspecified: Secondary | ICD-10-CM | POA: Insufficient documentation

## 2012-10-14 DIAGNOSIS — Z87891 Personal history of nicotine dependence: Secondary | ICD-10-CM | POA: Insufficient documentation

## 2012-10-14 MED ORDER — IPRATROPIUM BROMIDE 0.02 % IN SOLN
0.5000 mg | Freq: Once | RESPIRATORY_TRACT | Status: AC
  Administered 2012-10-14: 0.5 mg via RESPIRATORY_TRACT
  Filled 2012-10-14: qty 2.5

## 2012-10-14 MED ORDER — ALBUTEROL SULFATE (5 MG/ML) 0.5% IN NEBU
5.0000 mg | INHALATION_SOLUTION | Freq: Once | RESPIRATORY_TRACT | Status: AC
  Administered 2012-10-14: 5 mg via RESPIRATORY_TRACT
  Filled 2012-10-14: qty 1

## 2012-10-14 MED ORDER — PREDNISONE 50 MG PO TABS
60.0000 mg | ORAL_TABLET | Freq: Once | ORAL | Status: AC
  Administered 2012-10-15: 60 mg via ORAL
  Filled 2012-10-14: qty 1

## 2012-10-14 NOTE — ED Provider Notes (Deleted)
History   This chart was scribed for [employer]. Colon Branch, MD, by Frederik Pear, ER scribe. The patient was seen in room APA07/APA07 and the patient's care was started at 2312.    CSN: [id number]  Arrival date & time 10/14/12  2309   First MD Initiated Contact with Patient 10/14/12 2312      Chief Complaint  Patient presents with  . Shortness of Breath    (Consider location/radiation/quality/duration/timing/severity/associated sxs/prior treatment) HPI Comments: [name] is a 78 y.o. male with a h/o COPD who presents to the Emergency Department complaining of mild, intermittent SOB that began earlier tonight. He has visited the ED numerous times for the same complaint. He denies any other symptoms. He states that he has multiple inhalers at home, but that they do not work as well as receiving a breathing treatment in ED.    Past Medical History  Diagnosis Date  . COPD (chronic obstructive pulmonary disease)   . Anxiety   . Alcohol abuse   . Anemia   . Generalized headaches   . Hypertension   . 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 Pulmonary nodule/lesion, solitary   . Schizoaffective disorder   . GI bleed     History of recurrent bleeding that dates back as far as 2004 per records,    Past Surgical History  Procedure Date  . Colonoscopy Sept 2009    SLF: few small AVMs in ascending colon distal to IC . Ablated via BICAP.   Marland Kitchen Esophagogastroduodenoscopy Sept 2009    SLF: normal esophagus, small HH, 1-2 AVMs actively oozing in mid stomach, s/p BICAP, path benign   . Esophagogastroduodenoscopy 01/18/2012    Rourk-Erosive reflux esophagitis. Noncritical ring. Antral erosions/ small antral ulcer - appeared innocent  (H pylori serrology negative)  . Colonoscopy 03/09/2012    Procedure: COLONOSCOPY;  Surgeon: West Bali, MD;  [facility]: [facility];  Service: Endoscopy;  Laterality: N/A;  2:30  .  Esophagogastroduodenoscopy 03/09/2012    Procedure: ESOPHAGOGASTRODUODENOSCOPY (EGD);  Surgeon: West Bali, MD;  [facility]: [facility];  Service: Endoscopy;  Laterality: N/A;    Family History  Problem Relation Age of Onset  . Colon cancer Neg Hx     History  Substance Use Topics  . Smoking status: Former Smoker    Types: Cigarettes    Quit date: 05/07/1969  . Smokeless tobacco: Current User    Types: Snuff  . Alcohol Use: No      Review of Systems  Respiratory: Positive for shortness of breath.   All other systems reviewed and are negative.    Allergies  Black pepper  Home Medications   Current Outpatient Rx  Name  Route  Sig  Dispense  Refill  . ALBUTEROL SULFATE HFA 108 (90 BASE) MCG/ACT IN AERS   Inhalation   Inhale 2 puffs into the lungs every 6 (six) hours as needed. Shortness of Breath         . DIAZEPAM 5 MG PO TABS   Oral   Take 5 mg by mouth 3 (three) times daily.         Marland Kitchen HYDROCODONE-ACETAMINOPHEN 5-325 MG PO TABS   Oral   Take 1 tablet by mouth 4 (four) times daily as needed. Pain.           There were no vitals taken for this visit.  Physical Exam  Nursing note and  vitals reviewed. Constitutional: He is oriented to person, place, and time.  HENT:  Head: Normocephalic and atraumatic.  Neck: Normal range of motion. Neck supple.  Cardiovascular: Normal rate.   Pulmonary/Chest: Effort normal and breath sounds normal. No respiratory distress.       He has forced expiratory wheezes.  Abdominal: Soft. Bowel sounds are normal. There is no tenderness.  Musculoskeletal: Normal range of motion.  Neurological: He is alert and oriented to person, place, and time.  Skin: Skin is warm and dry.  Psychiatric: He has a normal mood and affect. Thought content normal.    ED Course  Procedures (including critical care time)  DIAGNOSTIC STUDIES: Oxygen Saturation is 94% on room air, adequate by my interpretation.    COORDINATION OF  CARE:  23:20- Discussed planned course of treatment with the patient, including a breathing treatment, who is agreeable at this time.    Labs Reviewed - No data to display No results found.   No diagnosis found.    MDM          Nicoletta Dress. Colon Branch, MD 10/15/12 0134  Nicoletta Dress. Colon Branch, MD 10/15/12 [phone]

## 2012-10-14 NOTE — ED Notes (Signed)
Pt c/o SOB that began earlier tonight. Pt's states he was using chewing tobacco and thinks some of it is stuck in his throat.

## 2012-10-14 NOTE — ED Provider Notes (Signed)
History     CSN: [id number]  Arrival date & time 10/14/12  2309   First MD Initiated Contact with Patient 10/14/12 2312      Chief Complaint  Patient presents with  . Shortness of Breath    (Consider location/radiation/quality/duration/timing/severity/associated sxs/prior treatment) HPI  [name] is a 78 y.o. male with a h/o COPD , frequent visits to the ER for shortness of breath who presents to the Emergency Department complaining of shortness of breath that began when he was getting ready for bed. He did not use the inhaler given him yesterday or use his nebulizer. He has not taken the Rx for prednisone given him yesterday. He denies cough, nausea, vomiting, fever, chills.   PCP Dr. Juanetta Gosling  Past Medical History  Diagnosis Date  . COPD (chronic obstructive pulmonary disease)   . Anxiety   . Alcohol abuse   . Anemia   . Generalized headaches   . Hypertension   . 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 Pulmonary nodule/lesion, solitary   . Schizoaffective disorder   . GI bleed     History of recurrent bleeding that dates back as far as 2004 per records,    Past Surgical History  Procedure Date  . Colonoscopy Sept 2009    SLF: few small AVMs in ascending colon distal to IC . Ablated via BICAP.   Marland Kitchen Esophagogastroduodenoscopy Sept 2009    SLF: normal esophagus, small HH, 1-2 AVMs actively oozing in mid stomach, s/p BICAP, path benign   . Esophagogastroduodenoscopy 01/18/2012    Rourk-Erosive reflux esophagitis. Noncritical ring. Antral erosions/ small antral ulcer - appeared innocent  (H pylori serrology negative)  . Colonoscopy 03/09/2012    Procedure: COLONOSCOPY;  Surgeon: West Bali, MD;  [facility]: [facility];  Service: Endoscopy;  Laterality: N/A;  2:30  . Esophagogastroduodenoscopy 03/09/2012    Procedure: ESOPHAGOGASTRODUODENOSCOPY (EGD);  Surgeon: West Bali, MD;  [facility]: [facility];   Service: Endoscopy;  Laterality: N/A;    Family History  Problem Relation Age of Onset  . Colon cancer Neg Hx     History  Substance Use Topics  . Smoking status: Former Smoker    Types: Cigarettes    Quit date: 05/07/1969  . Smokeless tobacco: Current User    Types: Snuff  . Alcohol Use: No      Review of Systems  Constitutional: Negative for fever.       10 Systems reviewed and are negative for acute change except as noted in the HPI.  HENT: Negative for congestion.   Eyes: Negative for discharge and redness.  Respiratory: Positive for shortness of breath and wheezing. Negative for cough.   Cardiovascular: Negative for chest pain.  Gastrointestinal: Negative for vomiting and abdominal pain.  Musculoskeletal: Negative for back pain.  Skin: Negative for rash.  Neurological: Negative for syncope, numbness and headaches.  Psychiatric/Behavioral:       No behavior change.    Allergies  Black pepper  Home Medications   Current Outpatient Rx  Name  Route  Sig  Dispense  Refill  . ALBUTEROL SULFATE HFA 108 (90 BASE) MCG/ACT IN AERS   Inhalation   Inhale 2 puffs into the lungs every 6 (six) hours as needed. Shortness of Breath         . DIAZEPAM 5 MG PO TABS   Oral   Take 5 mg by mouth 3 (  three) times daily.         Marland Kitchen HYDROCODONE-ACETAMINOPHEN 5-325 MG PO TABS   Oral   Take 1 tablet by mouth 4 (four) times daily as needed. Pain.           BP 126/73  Pulse 99  Temp 98.1 F (36.7 C) (Oral)  Resp 18  SpO2 94%  Physical Exam  Nursing note and vitals reviewed. Constitutional: He is oriented to person, place, and time. He appears well-developed and well-nourished.       Awake, alert, nontoxic appearance.  HENT:  Head: Atraumatic.  Eyes: Right eye exhibits no discharge. Left eye exhibits no discharge.  Neck: Neck supple.  Cardiovascular: Normal heart sounds.   Pulmonary/Chest: Effort normal and breath sounds normal. He exhibits no tenderness.        Wheezing with forced expiration  Abdominal: Soft. Bowel sounds are normal. There is no tenderness. There is no rebound.  Musculoskeletal: Normal range of motion. He exhibits no tenderness.       Baseline ROM, no obvious new focal weakness.  Neurological: He is alert and oriented to person, place, and time.       Mental status and motor strength appears baseline for patient and situation.  Skin: Skin is warm and dry. No rash noted.  Psychiatric: He has a normal mood and affect.    ED Course  Procedures (including critical care time)     MDM  Patient with h/o COPD and frequent visits to the ER for SOB, here with SOB. Given albuterol/atrovent nebulizer treatment, prednisone. O2 sat on RA 95-98%.  Pt feels improved after observation and/or treatment in ED.Pt stable in ED with no significant deterioration in condition.The patient appears reasonably screened and/or stabilized for discharge and I doubt any other medical condition or other [facility] requiring further screening, evaluation, or treatment in the ED at this time prior to discharge.  MDM Reviewed: previous chart, nursing note and vitals Reviewed previous: x-ray           Nicoletta Dress. Colon Branch, MD 10/15/12 [phone]

## 2012-10-19 ENCOUNTER — Emergency Department (HOSPITAL_COMMUNITY)
Admission: EM | Admit: 2012-10-19 | Discharge: 2012-10-20 | Disposition: A | Discharge status: Home / Self Care | Payer: PRIVATE HEALTH INSURANCE | Attending: Emergency Medicine | Admitting: Emergency Medicine

## 2012-10-19 ENCOUNTER — Encounter (HOSPITAL_COMMUNITY): Payer: Self-pay | Admitting: *Deleted

## 2012-10-19 DIAGNOSIS — Z79899 Other long term (current) drug therapy: Secondary | ICD-10-CM | POA: Insufficient documentation

## 2012-10-19 DIAGNOSIS — Z862 Personal history of diseases of the blood and blood-forming organs and certain disorders involving the immune mechanism: Secondary | ICD-10-CM | POA: Insufficient documentation

## 2012-10-19 DIAGNOSIS — Z87891 Personal history of nicotine dependence: Secondary | ICD-10-CM | POA: Insufficient documentation

## 2012-10-19 DIAGNOSIS — I10 Essential (primary) hypertension: Secondary | ICD-10-CM | POA: Insufficient documentation

## 2012-10-19 DIAGNOSIS — Z8659 Personal history of other mental and behavioral disorders: Secondary | ICD-10-CM | POA: Insufficient documentation

## 2012-10-19 DIAGNOSIS — J449 Chronic obstructive pulmonary disease, unspecified: Secondary | ICD-10-CM

## 2012-10-19 DIAGNOSIS — J441 Chronic obstructive pulmonary disease with (acute) exacerbation: Secondary | ICD-10-CM | POA: Insufficient documentation

## 2012-10-19 DIAGNOSIS — F1010 Alcohol abuse, uncomplicated: Secondary | ICD-10-CM | POA: Insufficient documentation

## 2012-10-19 DIAGNOSIS — Z8719 Personal history of other diseases of the digestive system: Secondary | ICD-10-CM | POA: Insufficient documentation

## 2012-10-19 DIAGNOSIS — Z8679 Personal history of other diseases of the circulatory system: Secondary | ICD-10-CM | POA: Insufficient documentation

## 2012-10-19 MED ORDER — ALBUTEROL SULFATE (5 MG/ML) 0.5% IN NEBU
2.5000 mg | INHALATION_SOLUTION | Freq: Once | RESPIRATORY_TRACT | Status: AC
  Administered 2012-10-19: 2.5 mg via RESPIRATORY_TRACT
  Filled 2012-10-19: qty 0.5

## 2012-10-19 MED ORDER — IPRATROPIUM BROMIDE 0.02 % IN SOLN
0.5000 mg | Freq: Once | RESPIRATORY_TRACT | Status: AC
  Administered 2012-10-19: 0.5 mg via RESPIRATORY_TRACT
  Filled 2012-10-19: qty 2.5

## 2012-10-19 NOTE — ED Notes (Signed)
Pt co increasing SOB today

## 2012-10-19 NOTE — ED Provider Notes (Addendum)
History  This chart was scribed for [employer]. Colon Branch, MD by Bennett Scrape, ED Scribe. This patient was seen in room [facility]18[facility] and the patient's care was started at 11:35 PM.  CSN: [id number]  Arrival date & time 10/19/12  2204   First MD Initiated Contact with Patient 10/19/12 2335      Chief Complaint  Patient presents with  . Shortness of Breath     The history is provided by the patient. No language interpreter was used.    [name] is a 78 y.o. male brought in by ambulance with a h/o frequent visits to the ED for COPD, who presents to the Emergency Department complaining of gradual onset, gradually worsening, constant SOB that started today. He was seen yesterday in the ED for the same by me and was told to follow up with his PCP (Dr. Juanetta Gosling). He denies calling his PCP today. He states that he has an appointment with Dr. Juanetta Gosling in 3 days. He denies having CP, abdominal pain and cough as associated symptoms. He is a former smoker but denies alcohol use.  Pt is well-known to me and has been seen in the ED several times before for the same. He has been seen in this ED 3 times in the past 7 days for the same, last ED visit being yesterday.  Past Medical History  Diagnosis Date  . COPD (chronic obstructive pulmonary disease)   . Anxiety   . Alcohol abuse   . Anemia   . Generalized headaches   . Hypertension   . 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 Pulmonary nodule/lesion, solitary   . Schizoaffective disorder   . GI bleed     History of recurrent bleeding that dates back as far as 2004 per records,    Past Surgical History  Procedure Date  . Colonoscopy Sept 2009    SLF: few small AVMs in ascending colon distal to IC . Ablated via BICAP.   Marland Kitchen Esophagogastroduodenoscopy Sept 2009    SLF: normal esophagus, small HH, 1-2 AVMs actively oozing in mid stomach, s/p BICAP, path benign   .  Esophagogastroduodenoscopy 01/18/2012    Rourk-Erosive reflux esophagitis. Noncritical ring. Antral erosions/ small antral ulcer - appeared innocent  (H pylori serrology negative)  . Colonoscopy 03/09/2012    Procedure: COLONOSCOPY;  Surgeon: West Bali, MD;  [facility];  Service: Endoscopy;  Laterality: N/A;  2:30  . Esophagogastroduodenoscopy 03/09/2012    Procedure: ESOPHAGOGASTRODUODENOSCOPY (EGD);  Surgeon: West Bali, MD;  [facility];  Service: Endoscopy;  Laterality: N/A;    Family History  Problem Relation Age of Onset  . Colon cancer Neg Hx     History  Substance Use Topics  . Smoking status: Former Smoker    Types: Cigarettes    Quit date: 05/07/1969  . Smokeless tobacco: Current User    Types: Snuff  . Alcohol Use: No      Review of Systems  Constitutional: Negative for fever and chills.       A complete 10 system review of systems was obtained and all systems are negative except as noted in the HPI and PMH.   Respiratory: Positive for shortness of breath. Negative for cough and wheezing.   Cardiovascular: Negative for chest pain.  Gastrointestinal: Negative for nausea, vomiting, abdominal pain and diarrhea.  All other systems reviewed and are negative.  Allergies  Black pepper  Home Medications   Current Outpatient Rx  Name  Route  Sig  Dispense  Refill  . ALBUTEROL SULFATE HFA 108 (90 BASE) MCG/ACT IN AERS   Inhalation   Inhale 2 puffs into the lungs every 6 (six) hours as needed. Shortness of Breath         . DIAZEPAM 5 MG PO TABS   Oral   Take 5 mg by mouth 3 (three) times daily.         Marland Kitchen HYDROCODONE-ACETAMINOPHEN 5-325 MG PO TABS   Oral   Take 1 tablet by mouth 4 (four) times daily as needed. Pain.           Triage Vitals: BP 134/88  Pulse 103  Temp 97.7 F (36.5 C) (Oral)  Resp 20  SpO2 95%  Physical Exam  Nursing note and vitals reviewed. Constitutional: He is oriented to person, place, and  time. He appears well-developed and well-nourished. No distress.  HENT:  Head: Normocephalic and atraumatic.  Mouth/Throat: Oropharynx is clear and moist.  Eyes: Conjunctivae normal and EOM are normal.  Neck: Normal range of motion. Neck supple. No tracheal deviation present.  Cardiovascular: Normal rate and regular rhythm.  Exam reveals no gallop and no friction rub.   No murmur heard. Pulmonary/Chest: Effort normal and breath sounds normal. No respiratory distress. He has no wheezes.  Abdominal: Soft. There is no tenderness.  Musculoskeletal: Normal range of motion. He exhibits no edema.  Neurological: He is alert and oriented to person, place, and time.  Skin: Skin is warm and dry.  Psychiatric: He has a normal mood and affect. His behavior is normal.    ED Course  Procedures (including critical care time)  DIAGNOSTIC STUDIES: Oxygen Saturation is 100% on 2L of O2, normal by my interpretation.    COORDINATION OF CARE: 11:49 PM- Discussed treatment plan which includes a breathing treatment with pt at bedside and pt agreed to plan.      MDM  Patient with COPD who visits the ER frequently with shortness of breath. He received an albuterol nebulizer treatment with subjective improvement. Pt stable in ED with no significant deterioration in condition.The patient appears reasonably screened and/or stabilized for discharge and I doubt any other medical condition or other [facility] requiring further screening, evaluation, or treatment in the ED at this time prior to discharge.   MDM Reviewed: previous chart, nursing note and vitals           Nicoletta Dress. Colon Branch, MD 10/20/12 0124  Nicoletta Dress. Colon Branch, MD 10/27/12 1044

## 2012-10-20 ENCOUNTER — Encounter (HOSPITAL_COMMUNITY): Payer: Self-pay | Admitting: *Deleted

## 2012-10-20 DIAGNOSIS — Z79899 Other long term (current) drug therapy: Secondary | ICD-10-CM | POA: Insufficient documentation

## 2012-10-20 DIAGNOSIS — J449 Chronic obstructive pulmonary disease, unspecified: Secondary | ICD-10-CM | POA: Insufficient documentation

## 2012-10-20 DIAGNOSIS — F411 Generalized anxiety disorder: Secondary | ICD-10-CM | POA: Insufficient documentation

## 2012-10-20 DIAGNOSIS — Z8669 Personal history of other diseases of the nervous system and sense organs: Secondary | ICD-10-CM | POA: Insufficient documentation

## 2012-10-20 DIAGNOSIS — K5900 Constipation, unspecified: Secondary | ICD-10-CM | POA: Insufficient documentation

## 2012-10-20 DIAGNOSIS — Z862 Personal history of diseases of the blood and blood-forming organs and certain disorders involving the immune mechanism: Secondary | ICD-10-CM | POA: Insufficient documentation

## 2012-10-20 DIAGNOSIS — Z8719 Personal history of other diseases of the digestive system: Secondary | ICD-10-CM | POA: Insufficient documentation

## 2012-10-20 DIAGNOSIS — F259 Schizoaffective disorder, unspecified: Secondary | ICD-10-CM | POA: Insufficient documentation

## 2012-10-20 DIAGNOSIS — F1010 Alcohol abuse, uncomplicated: Secondary | ICD-10-CM | POA: Insufficient documentation

## 2012-10-20 DIAGNOSIS — Z87891 Personal history of nicotine dependence: Secondary | ICD-10-CM | POA: Insufficient documentation

## 2012-10-20 DIAGNOSIS — J4489 Other specified chronic obstructive pulmonary disease: Secondary | ICD-10-CM | POA: Insufficient documentation

## 2012-10-20 NOTE — ED Notes (Signed)
Pt states he can't hardly breath.

## 2012-10-20 NOTE — ED Notes (Signed)
Discharge instructions reviewed with pt, questions answered. Pt verbalized understanding.  

## 2012-10-21 ENCOUNTER — Emergency Department (HOSPITAL_COMMUNITY): Payer: PRIVATE HEALTH INSURANCE

## 2012-10-21 ENCOUNTER — Emergency Department (HOSPITAL_COMMUNITY)
Admit: 2012-10-21 | Discharge: 2012-10-21 | Disposition: A | Discharge status: Home / Self Care | Payer: PRIVATE HEALTH INSURANCE

## 2012-10-21 ENCOUNTER — Emergency Department (HOSPITAL_COMMUNITY)
Admission: EM | Admit: 2012-10-21 | Discharge: 2012-10-21 | Disposition: A | Discharge status: Home / Self Care | Payer: PRIVATE HEALTH INSURANCE | Attending: Emergency Medicine | Admitting: Emergency Medicine

## 2012-10-21 DIAGNOSIS — R059 Cough, unspecified: Secondary | ICD-10-CM | POA: Insufficient documentation

## 2012-10-21 DIAGNOSIS — R05 Cough: Secondary | ICD-10-CM | POA: Insufficient documentation

## 2012-10-21 DIAGNOSIS — J449 Chronic obstructive pulmonary disease, unspecified: Secondary | ICD-10-CM

## 2012-10-21 DIAGNOSIS — K5900 Constipation, unspecified: Secondary | ICD-10-CM

## 2012-10-21 DIAGNOSIS — R0602 Shortness of breath: Secondary | ICD-10-CM | POA: Insufficient documentation

## 2012-10-21 MED ORDER — ALBUTEROL SULFATE (5 MG/ML) 0.5% IN NEBU
5.0000 mg | INHALATION_SOLUTION | Freq: Once | RESPIRATORY_TRACT | Status: AC
  Administered 2012-10-21: 5 mg via RESPIRATORY_TRACT
  Filled 2012-10-21: qty 1

## 2012-10-21 MED ORDER — IPRATROPIUM BROMIDE 0.02 % IN SOLN
0.5000 mg | Freq: Once | RESPIRATORY_TRACT | Status: AC
  Administered 2012-10-21: 0.5 mg via RESPIRATORY_TRACT
  Filled 2012-10-21: qty 2.5

## 2012-10-23 ENCOUNTER — Emergency Department (HOSPITAL_COMMUNITY)
Admission: EM | Admit: 2012-10-23 | Discharge: 2012-10-24 | Disposition: A | Discharge status: Home / Self Care | Payer: PRIVATE HEALTH INSURANCE | Attending: Emergency Medicine | Admitting: Emergency Medicine

## 2012-10-23 ENCOUNTER — Encounter (HOSPITAL_COMMUNITY): Payer: Self-pay | Admitting: Emergency Medicine

## 2012-10-23 DIAGNOSIS — Z79899 Other long term (current) drug therapy: Secondary | ICD-10-CM | POA: Insufficient documentation

## 2012-10-23 DIAGNOSIS — R062 Wheezing: Secondary | ICD-10-CM | POA: Insufficient documentation

## 2012-10-23 DIAGNOSIS — Z862 Personal history of diseases of the blood and blood-forming organs and certain disorders involving the immune mechanism: Secondary | ICD-10-CM | POA: Insufficient documentation

## 2012-10-23 DIAGNOSIS — Z87891 Personal history of nicotine dependence: Secondary | ICD-10-CM | POA: Insufficient documentation

## 2012-10-23 DIAGNOSIS — F411 Generalized anxiety disorder: Secondary | ICD-10-CM | POA: Insufficient documentation

## 2012-10-23 DIAGNOSIS — Z8659 Personal history of other mental and behavioral disorders: Secondary | ICD-10-CM | POA: Insufficient documentation

## 2012-10-23 DIAGNOSIS — F1010 Alcohol abuse, uncomplicated: Secondary | ICD-10-CM | POA: Insufficient documentation

## 2012-10-23 DIAGNOSIS — J4489 Other specified chronic obstructive pulmonary disease: Secondary | ICD-10-CM | POA: Insufficient documentation

## 2012-10-23 DIAGNOSIS — J449 Chronic obstructive pulmonary disease, unspecified: Secondary | ICD-10-CM

## 2012-10-23 DIAGNOSIS — Z8719 Personal history of other diseases of the digestive system: Secondary | ICD-10-CM | POA: Insufficient documentation

## 2012-10-23 DIAGNOSIS — I10 Essential (primary) hypertension: Secondary | ICD-10-CM | POA: Insufficient documentation

## 2012-10-23 MED ORDER — IPRATROPIUM BROMIDE 0.02 % IN SOLN
0.5000 mg | Freq: Once | RESPIRATORY_TRACT | Status: AC
  Administered 2012-10-24: 0.5 mg via RESPIRATORY_TRACT
  Filled 2012-10-23: qty 2.5

## 2012-10-23 MED ORDER — ALBUTEROL SULFATE (5 MG/ML) 0.5% IN NEBU
5.0000 mg | INHALATION_SOLUTION | Freq: Once | RESPIRATORY_TRACT | Status: AC
  Administered 2012-10-24: 5 mg via RESPIRATORY_TRACT
  Filled 2012-10-23: qty 1

## 2012-10-23 NOTE — ED Notes (Signed)
Patient arrived via [facility] with c/o shortness of breath.  Patient ambulated from EMS truck.  Patient refused any treatment from EMS.

## 2012-10-24 NOTE — ED Provider Notes (Signed)
History     CSN: [id number]  Arrival date & time 10/23/12  2338   First MD Initiated Contact with Patient 10/23/12 2354      Chief Complaint  Patient presents with  . Shortness of Breath    (Consider location/radiation/quality/duration/timing/severity/associated sxs/prior treatment) HPI [name] is a 78 y.o. male with a h/o COPD and multiple visits to the ER with shortness of breath  brought in by ambulance, who presents to the Emergency Department complaining of shortness of breath that began tonight when he was getting ready for bed. He refused treatment by EMS insisting he be brought to the ER for treatment. He states he is wheezing. Denies fever, chills, cough, nausea, vomiting.   PCP Dr. Juanetta Gosling Past Medical History  Diagnosis Date  . COPD (chronic obstructive pulmonary disease)   . Anxiety   . Alcohol abuse   . Anemia   . Generalized headaches   . Hypertension   . 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 Pulmonary nodule/lesion, solitary   . Schizoaffective disorder   . GI bleed     History of recurrent bleeding that dates back as far as 2004 per records,    Past Surgical History  Procedure Date  . Colonoscopy Sept 2009    SLF: few small AVMs in ascending colon distal to IC . Ablated via BICAP.   Marland Kitchen Esophagogastroduodenoscopy Sept 2009    SLF: normal esophagus, small HH, 1-2 AVMs actively oozing in mid stomach, s/p BICAP, path benign   . Esophagogastroduodenoscopy 01/18/2012    Rourk-Erosive reflux esophagitis. Noncritical ring. Antral erosions/ small antral ulcer - appeared innocent  (H pylori serrology negative)  . Colonoscopy 03/09/2012    Procedure: COLONOSCOPY;  Surgeon: West Bali, MD;  [facility];  Service: Endoscopy;  Laterality: N/A;  2:30  . Esophagogastroduodenoscopy 03/09/2012    Procedure: ESOPHAGOGASTRODUODENOSCOPY (EGD);  Surgeon: West Bali, MD;  [facility];   Service: Endoscopy;  Laterality: N/A;    Family History  Problem Relation Age of Onset  . Colon cancer Neg Hx     History  Substance Use Topics  . Smoking status: Former Smoker    Types: Cigarettes    Quit date: 05/07/1969  . Smokeless tobacco: Current User    Types: Snuff  . Alcohol Use: No      Review of Systems  Constitutional: Negative for fever.       10 Systems reviewed and are negative for acute change except as noted in the HPI.  HENT: Negative for congestion.   Eyes: Negative for discharge and redness.  Respiratory: Positive for shortness of breath and wheezing. Negative for cough.   Cardiovascular: Negative for chest pain.  Gastrointestinal: Negative for vomiting and abdominal pain.  Musculoskeletal: Negative for back pain.  Skin: Negative for rash.  Neurological: Negative for syncope, numbness and headaches.  Psychiatric/Behavioral:       No behavior change.    Allergies  Black pepper  Home Medications   Current Outpatient Rx  Name  Route  Sig  Dispense  Refill  . ALBUTEROL SULFATE HFA 108 (90 BASE) MCG/ACT IN AERS   Inhalation   Inhale 2 puffs into the lungs every 6 (six) hours as needed. Shortness of Breath         . DIAZEPAM 5 MG PO TABS   Oral   Take 5 mg by mouth 3 (three) times  daily.         Marland Kitchen HYDROCODONE-ACETAMINOPHEN 5-325 MG PO TABS   Oral   Take 1 tablet by mouth 4 (four) times daily as needed. Pain.           BP 126/78  Pulse 100  Temp 98.3 F (36.8 C) (Oral)  Resp 22  SpO2 90%  Physical Exam  Nursing note and vitals reviewed. Constitutional:       Awake, alert, nontoxic appearance.  HENT:  Head: Atraumatic.  Eyes: Right eye exhibits no discharge. Left eye exhibits no discharge.  Neck: Neck supple.  Pulmonary/Chest: Effort normal. No respiratory distress. He has wheezes. He exhibits no tenderness.       End expiratory wheezing  Abdominal: Soft. There is no tenderness. There is no rebound.  Musculoskeletal: He  exhibits no tenderness.       Baseline ROM, no obvious new focal weakness.  Neurological:       Mental status and motor strength appears baseline for patient and situation.  Skin: No rash noted.  Psychiatric: He has a normal mood and affect.    ED Course  Procedures (including critical care time)   0050 Received albuterol/atrovent nebulizer treatment with resolution of wheezing. O2 sats remain 98% on RA.  MDM  Patient with COPD, frequent visits to the ER for shortness of breath  Presents with shortness of breath and wheezing. Given nebulizer treatment with resolution. Pt stable in ED with no significant deterioration in condition.The patient appears reasonably screened and/or stabilized for discharge and I doubt any other medical condition or other [facility] requiring further screening, evaluation, or treatment in the ED at this time prior to discharge.  MDM Reviewed: nursing note, vitals and previous chart           Nicoletta Dress. Colon Branch, MD 10/24/12 [phone]

## 2012-10-25 ENCOUNTER — Emergency Department (HOSPITAL_COMMUNITY)
Admission: EM | Admit: 2012-10-25 | Discharge: 2012-10-25 | Discharge status: Against Medical Advice | Payer: PRIVATE HEALTH INSURANCE | Attending: Emergency Medicine | Admitting: Emergency Medicine

## 2012-10-25 ENCOUNTER — Encounter (HOSPITAL_COMMUNITY): Payer: Self-pay

## 2012-10-25 DIAGNOSIS — Z791 Long term (current) use of non-steroidal anti-inflammatories (NSAID): Secondary | ICD-10-CM | POA: Insufficient documentation

## 2012-10-25 DIAGNOSIS — Z862 Personal history of diseases of the blood and blood-forming organs and certain disorders involving the immune mechanism: Secondary | ICD-10-CM | POA: Insufficient documentation

## 2012-10-25 DIAGNOSIS — R062 Wheezing: Secondary | ICD-10-CM | POA: Insufficient documentation

## 2012-10-25 DIAGNOSIS — I10 Essential (primary) hypertension: Secondary | ICD-10-CM | POA: Insufficient documentation

## 2012-10-25 DIAGNOSIS — Z8659 Personal history of other mental and behavioral disorders: Secondary | ICD-10-CM | POA: Insufficient documentation

## 2012-10-25 DIAGNOSIS — J449 Chronic obstructive pulmonary disease, unspecified: Secondary | ICD-10-CM

## 2012-10-25 DIAGNOSIS — Z79899 Other long term (current) drug therapy: Secondary | ICD-10-CM | POA: Insufficient documentation

## 2012-10-25 DIAGNOSIS — Z8719 Personal history of other diseases of the digestive system: Secondary | ICD-10-CM | POA: Insufficient documentation

## 2012-10-25 DIAGNOSIS — Z8679 Personal history of other diseases of the circulatory system: Secondary | ICD-10-CM | POA: Insufficient documentation

## 2012-10-25 DIAGNOSIS — Z87891 Personal history of nicotine dependence: Secondary | ICD-10-CM | POA: Insufficient documentation

## 2012-10-25 DIAGNOSIS — F411 Generalized anxiety disorder: Secondary | ICD-10-CM | POA: Insufficient documentation

## 2012-10-25 DIAGNOSIS — Z8709 Personal history of other diseases of the respiratory system: Secondary | ICD-10-CM | POA: Insufficient documentation

## 2012-10-25 DIAGNOSIS — J4489 Other specified chronic obstructive pulmonary disease: Secondary | ICD-10-CM | POA: Insufficient documentation

## 2012-10-25 MED ORDER — ALBUTEROL SULFATE (5 MG/ML) 0.5% IN NEBU
2.5000 mg | INHALATION_SOLUTION | RESPIRATORY_TRACT | Status: DC
  Administered 2012-10-25: 2.5 mg via RESPIRATORY_TRACT
  Filled 2012-10-25: qty 0.5

## 2012-10-25 MED ORDER — IPRATROPIUM BROMIDE 0.02 % IN SOLN
0.5000 mg | RESPIRATORY_TRACT | Status: DC
  Administered 2012-10-25: 0.5 mg via RESPIRATORY_TRACT
  Filled 2012-10-25: qty 2.5

## 2012-10-25 NOTE — ED Notes (Signed)
Pt here for SOB. Pt speaks short phrases. Pt breathing labored. Pt states, " I need a breathing treatment" denies cp.

## 2012-10-25 NOTE — ED Provider Notes (Signed)
History     CSN: [id number]  Arrival date & time 10/25/12  1622   First MD Initiated Contact with Patient 10/25/12 1729      Chief Complaint  Patient presents with  . Shortness of Breath    (Consider location/radiation/quality/duration/timing/severity/associated sxs/prior treatment) HPI Comments: Patient presents to the ER for evaluation of shortness of breath. Patient reports that he has not been able to use his nebulizer machine because his wheezing is broken. He denies any fever or cough. Is not experiencing any chest pain. Symptoms of shortness of breath that worsened through the course of today since he has not been able to use his nebulizer. Shortness of breath is moderate.  Patient is a 78 y.o. male presenting with shortness of breath.  Shortness of Breath  Associated symptoms include shortness of breath. Pertinent negatives include no chest pain and no cough.    Past Medical History  Diagnosis Date  . COPD (chronic obstructive pulmonary disease)   . Anxiety   . Alcohol abuse   . Anemia   . Generalized headaches   . Hypertension   . 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 Pulmonary nodule/lesion, solitary   . Schizoaffective disorder   . GI bleed     History of recurrent bleeding that dates back as far as 2004 per records,    Past Surgical History  Procedure Date  . Colonoscopy Sept 2009    SLF: few small AVMs in ascending colon distal to IC . Ablated via BICAP.   Marland Kitchen Esophagogastroduodenoscopy Sept 2009    SLF: normal esophagus, small HH, 1-2 AVMs actively oozing in mid stomach, s/p BICAP, path benign   . Esophagogastroduodenoscopy 01/18/2012    Rourk-Erosive reflux esophagitis. Noncritical ring. Antral erosions/ small antral ulcer - appeared innocent  (H pylori serrology negative)  . Colonoscopy 03/09/2012    Procedure: COLONOSCOPY;  Surgeon: West Bali, MD;  [facility];  Service: Endoscopy;   Laterality: N/A;  2:30  . Esophagogastroduodenoscopy 03/09/2012    Procedure: ESOPHAGOGASTRODUODENOSCOPY (EGD);  Surgeon: West Bali, MD;  [facility];  Service: Endoscopy;  Laterality: N/A;    Family History  Problem Relation Age of Onset  . Colon cancer Neg Hx     History  Substance Use Topics  . Smoking status: Former Smoker    Types: Cigarettes    Quit date: 05/07/1969  . Smokeless tobacco: Current User    Types: Snuff  . Alcohol Use: No      Review of Systems  Respiratory: Positive for shortness of breath. Negative for cough.   Cardiovascular: Negative for chest pain.  All other systems reviewed and are negative.    Allergies  Black pepper  Home Medications   Current Outpatient Rx  Name  Route  Sig  Dispense  Refill  . ALBUTEROL SULFATE HFA 108 (90 BASE) MCG/ACT IN AERS   Inhalation   Inhale 2 puffs into the lungs every 6 (six) hours as needed. Shortness of Breath         . DIAZEPAM 5 MG PO TABS   Oral   Take 5 mg by mouth 3 (three) times daily.         Marland Kitchen HYDROCODONE-ACETAMINOPHEN 5-325 MG PO TABS   Oral   Take 1 tablet by mouth 4 (four) times daily as needed. Pain.           SpO2 95%  Physical Exam  Constitutional: He is oriented to person, place, and time. He appears well-developed and well-nourished. No distress.  HENT:  Head: Normocephalic and atraumatic.  Right Ear: Hearing normal.  Nose: Nose normal.  Mouth/Throat: Oropharynx is clear and moist and mucous membranes are normal.  Eyes: Conjunctivae normal and EOM are normal. Pupils are equal, round, and reactive to light.  Neck: Normal range of motion. Neck supple.  Cardiovascular: Normal rate, regular rhythm, S1 normal and S2 normal.  Exam reveals no gallop and no friction rub.   No murmur heard. Pulmonary/Chest: Effort normal. No respiratory distress. He has wheezes. He has no rales. He exhibits no tenderness.  Abdominal: Soft. Normal appearance and bowel sounds are  normal. There is no hepatosplenomegaly. There is no tenderness. There is no rebound, no guarding, no tenderness at McBurney's point and negative Murphy's sign. No hernia.  Musculoskeletal: Normal range of motion.  Neurological: He is alert and oriented to person, place, and time. He has normal strength. No cranial nerve deficit or sensory deficit. Coordination normal. GCS eye subscore is 4. GCS verbal subscore is 5. GCS motor subscore is 6.  Skin: Skin is warm, dry and intact. No rash noted. No cyanosis.  Psychiatric: He has a normal mood and affect. His speech is normal and behavior is normal. Thought content normal.    ED Course  Procedures (including critical care time)  Labs Reviewed - No data to display No results found.   No diagnosis found.    MDM  Patient does have mild shortness of breath and slight wheezing on arrival. Pulse ox, however, is 95% on room air. Patient does not wish to have any imaging or blood work performed. He says he only came in to get a new hose for his nebulizer. Patient was administered a DuoNeb here in the ER with some improvement. Patient left the ER before discharge. He did not appear to require admission at time of evaluation. He declined any further workup and did not appear to be incapacitated in any way.       Gilda Crease, MD 10/25/12 2034

## 2012-10-26 ENCOUNTER — Encounter (HOSPITAL_COMMUNITY): Payer: Self-pay | Admitting: *Deleted

## 2012-10-26 ENCOUNTER — Emergency Department (HOSPITAL_COMMUNITY)
Admission: EM | Admit: 2012-10-26 | Discharge: 2012-10-26 | Discharge status: Against Medical Advice | Payer: PRIVATE HEALTH INSURANCE | Attending: Emergency Medicine | Admitting: Emergency Medicine

## 2012-10-26 DIAGNOSIS — R0989 Other specified symptoms and signs involving the circulatory and respiratory systems: Secondary | ICD-10-CM | POA: Insufficient documentation

## 2012-10-26 DIAGNOSIS — R0609 Other forms of dyspnea: Secondary | ICD-10-CM | POA: Insufficient documentation

## 2012-10-26 DIAGNOSIS — R0600 Dyspnea, unspecified: Secondary | ICD-10-CM

## 2012-10-26 NOTE — ED Notes (Signed)
Pt c/o sob, used inhaler without relief.

## 2012-10-26 NOTE — ED Notes (Signed)
States he is tired of waiting and ready to go home now

## 2012-10-26 NOTE — ED Notes (Signed)
Patient states he is tired of waiting , states he will wait a little longer for edp

## 2012-10-26 NOTE — ED Notes (Signed)
Pt states he is feeling better and doesn't want to wait any longer, signed ama, ambulatory to leave, nad, no resp diff

## 2012-10-27 NOTE — ED Provider Notes (Signed)
Left without being seen after triage  [name], MD 10/27/12 1610

## 2012-10-27 NOTE — ED Provider Notes (Signed)
History     CSN: [id number]  Arrival date & time 10/20/12  2306   First MD Initiated Contact with Patient 10/21/12 0058      Chief Complaint  Patient presents with  . Shortness of Breath    (Consider location/radiation/quality/duration/timing/severity/associated sxs/prior treatment) HPI [name] is a 78 y.o. male with a h/o COPD, overuse of inhalers, frequent visits to the ER who presents to the Emergency Department complaining of shortness of breath and wheezing. He also complains of constipation. He has been using his inhaler without relief. States he was unable to go back to sleep. Denies fever, chills, chest pain.   PCP Dr. Juanetta Gosling  Past Medical History  Diagnosis Date  . COPD (chronic obstructive pulmonary disease)   . Anxiety   . Alcohol abuse   . Anemia   . Generalized headaches   . Hypertension   . 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 Pulmonary nodule/lesion, solitary   . Schizoaffective disorder   . GI bleed     History of recurrent bleeding that dates back as far as 2004 per records,    Past Surgical History  Procedure Date  . Colonoscopy Sept 2009    SLF: few small AVMs in ascending colon distal to IC . Ablated via BICAP.   Marland Kitchen Esophagogastroduodenoscopy Sept 2009    SLF: normal esophagus, small HH, 1-2 AVMs actively oozing in mid stomach, s/p BICAP, path benign   . Esophagogastroduodenoscopy 01/18/2012    Rourk-Erosive reflux esophagitis. Noncritical ring. Antral erosions/ small antral ulcer - appeared innocent  (H pylori serrology negative)  . Colonoscopy 03/09/2012    Procedure: COLONOSCOPY;  Surgeon: West Bali, MD;  [facility];  Service: Endoscopy;  Laterality: N/A;  2:30  . Esophagogastroduodenoscopy 03/09/2012    Procedure: ESOPHAGOGASTRODUODENOSCOPY (EGD);  Surgeon: West Bali, MD;  [facility];  Service: Endoscopy;  Laterality: N/A;    Family History    Problem Relation Age of Onset  . Colon cancer Neg Hx     History  Substance Use Topics  . Smoking status: Former Smoker    Types: Cigarettes    Quit date: 05/07/1969  . Smokeless tobacco: Current User    Types: Snuff  . Alcohol Use: No      Review of Systems  Constitutional: Negative for fever.       10 Systems reviewed and are negative for acute change except as noted in the HPI.  HENT: Negative for congestion.   Eyes: Negative for discharge and redness.  Respiratory: Positive for shortness of breath and wheezing. Negative for cough.   Cardiovascular: Negative for chest pain.  Gastrointestinal: Positive for constipation. Negative for vomiting and abdominal pain.  Musculoskeletal: Negative for back pain.  Skin: Negative for rash.  Neurological: Negative for syncope, numbness and headaches.  Psychiatric/Behavioral:       No behavior change.    Allergies  Black pepper  Home Medications   Current Outpatient Rx  Name  Route  Sig  Dispense  Refill  . ALBUTEROL SULFATE HFA 108 (90 BASE) MCG/ACT IN AERS   Inhalation   Inhale 2 puffs into the lungs every 6 (six) hours as needed. For shortness of breath         . DIAZEPAM 5 MG PO TABS   Oral   Take 5 mg by mouth 3 (three) times daily.         Marland Kitchen  HYDROCODONE-ACETAMINOPHEN 5-325 MG PO TABS   Oral   Take 0.5-1 tablets by mouth 4 (four) times daily as needed. Pain.           BP 110/81  Pulse 93  Temp 98.1 F (36.7 C)  Resp 20  Wt 130 lb (58.968 kg)  SpO2 93%  Physical Exam  Nursing note and vitals reviewed. Constitutional:       Awake, alert, nontoxic appearance.  HENT:  Head: Atraumatic.  Eyes: Right eye exhibits no discharge. Left eye exhibits no discharge.  Neck: Neck supple.  Pulmonary/Chest: Effort normal. He exhibits no tenderness.       Wheezing with forced expiration. O2 sats 96% on RA  Abdominal: Soft. There is no tenderness. There is no rebound.  Musculoskeletal: He exhibits no tenderness.        Baseline ROM, no obvious new focal weakness.  Neurological:       Mental status and motor strength appears baseline for patient and situation.  Skin: No rash noted.  Psychiatric: He has a normal mood and affect.    ED Course  Procedures (including critical care time)    1. COPD (chronic obstructive pulmonary disease)   2. Constipation       MDM  Elderly patient with COPD who is over utilizing the ER with daily of twice daily visits for c/o shortness of breath. He has not been in distress on the last several visits. He misuses his inhalers. His PCP, Dr. Juanetta Gosling is aware of frequent visits. He has discussed with the patient consideration of assisted living. Patient declined. He continues to come to the ER on his lawnmower or by calling EMS. He does have COPD and has, on occasion had infection that required antibiotic. He has been given inhalers frequently in the ER and obtains them from his PCP. [facility] Has been contacted by his PCP and is in the process of evaluating the patient for competency. Pt feels improved after observation and/or treatment in ED.Pt stable in ED with no significant deterioration in condition.The patient appears reasonably screened and/or stabilized for discharge and I doubt any other medical condition or other [facility] requiring further screening, evaluation, or treatment in the ED at this time prior to discharge.  MDM Reviewed: nursing note, vitals and previous chart        Nicoletta Dress. Colon Branch, MD 10/27/12 1043

## 2012-10-29 ENCOUNTER — Encounter (HOSPITAL_COMMUNITY): Payer: Self-pay | Admitting: Emergency Medicine

## 2012-10-29 DIAGNOSIS — Z8719 Personal history of other diseases of the digestive system: Secondary | ICD-10-CM | POA: Insufficient documentation

## 2012-10-29 DIAGNOSIS — I10 Essential (primary) hypertension: Secondary | ICD-10-CM | POA: Insufficient documentation

## 2012-10-29 DIAGNOSIS — Z87891 Personal history of nicotine dependence: Secondary | ICD-10-CM | POA: Insufficient documentation

## 2012-10-29 DIAGNOSIS — Z862 Personal history of diseases of the blood and blood-forming organs and certain disorders involving the immune mechanism: Secondary | ICD-10-CM | POA: Insufficient documentation

## 2012-10-29 DIAGNOSIS — Z8659 Personal history of other mental and behavioral disorders: Secondary | ICD-10-CM | POA: Insufficient documentation

## 2012-10-29 DIAGNOSIS — J441 Chronic obstructive pulmonary disease with (acute) exacerbation: Secondary | ICD-10-CM | POA: Insufficient documentation

## 2012-10-29 DIAGNOSIS — Z79899 Other long term (current) drug therapy: Secondary | ICD-10-CM | POA: Insufficient documentation

## 2012-10-29 DIAGNOSIS — Z8709 Personal history of other diseases of the respiratory system: Secondary | ICD-10-CM | POA: Insufficient documentation

## 2012-10-29 DIAGNOSIS — F411 Generalized anxiety disorder: Secondary | ICD-10-CM | POA: Insufficient documentation

## 2012-10-29 NOTE — ED Notes (Signed)
Patient complaining of shortness of breath starting approximately 30 minutes ago.

## 2012-10-30 ENCOUNTER — Encounter (HOSPITAL_COMMUNITY): Payer: Self-pay

## 2012-10-30 ENCOUNTER — Emergency Department (HOSPITAL_COMMUNITY): Payer: PRIVATE HEALTH INSURANCE

## 2012-10-30 ENCOUNTER — Emergency Department (HOSPITAL_COMMUNITY)
Admission: EM | Admit: 2012-10-30 | Discharge: 2012-10-30 | Disposition: A | Discharge status: Home / Self Care | Payer: PRIVATE HEALTH INSURANCE | Attending: Emergency Medicine | Admitting: Emergency Medicine

## 2012-10-30 DIAGNOSIS — F1010 Alcohol abuse, uncomplicated: Secondary | ICD-10-CM | POA: Insufficient documentation

## 2012-10-30 DIAGNOSIS — F259 Schizoaffective disorder, unspecified: Secondary | ICD-10-CM | POA: Insufficient documentation

## 2012-10-30 DIAGNOSIS — Q2733 Arteriovenous malformation of digestive system vessel: Secondary | ICD-10-CM | POA: Insufficient documentation

## 2012-10-30 DIAGNOSIS — Z8679 Personal history of other diseases of the circulatory system: Secondary | ICD-10-CM | POA: Insufficient documentation

## 2012-10-30 DIAGNOSIS — IMO0002 Reserved for concepts with insufficient information to code with codable children: Secondary | ICD-10-CM | POA: Insufficient documentation

## 2012-10-30 DIAGNOSIS — J449 Chronic obstructive pulmonary disease, unspecified: Secondary | ICD-10-CM

## 2012-10-30 DIAGNOSIS — I10 Essential (primary) hypertension: Secondary | ICD-10-CM | POA: Insufficient documentation

## 2012-10-30 DIAGNOSIS — K921 Melena: Secondary | ICD-10-CM | POA: Insufficient documentation

## 2012-10-30 DIAGNOSIS — Z9889 Other specified postprocedural states: Secondary | ICD-10-CM | POA: Insufficient documentation

## 2012-10-30 DIAGNOSIS — K5520 Angiodysplasia of colon without hemorrhage: Secondary | ICD-10-CM

## 2012-10-30 DIAGNOSIS — Z87891 Personal history of nicotine dependence: Secondary | ICD-10-CM | POA: Insufficient documentation

## 2012-10-30 DIAGNOSIS — Z862 Personal history of diseases of the blood and blood-forming organs and certain disorders involving the immune mechanism: Secondary | ICD-10-CM | POA: Insufficient documentation

## 2012-10-30 DIAGNOSIS — J4489 Other specified chronic obstructive pulmonary disease: Secondary | ICD-10-CM | POA: Insufficient documentation

## 2012-10-30 DIAGNOSIS — K625 Hemorrhage of anus and rectum: Secondary | ICD-10-CM | POA: Insufficient documentation

## 2012-10-30 DIAGNOSIS — R911 Solitary pulmonary nodule: Secondary | ICD-10-CM | POA: Insufficient documentation

## 2012-10-30 DIAGNOSIS — R0602 Shortness of breath: Secondary | ICD-10-CM | POA: Insufficient documentation

## 2012-10-30 DIAGNOSIS — Z79899 Other long term (current) drug therapy: Secondary | ICD-10-CM | POA: Insufficient documentation

## 2012-10-30 DIAGNOSIS — F411 Generalized anxiety disorder: Secondary | ICD-10-CM | POA: Insufficient documentation

## 2012-10-30 DIAGNOSIS — Z8719 Personal history of other diseases of the digestive system: Secondary | ICD-10-CM | POA: Insufficient documentation

## 2012-10-30 DIAGNOSIS — R059 Cough, unspecified: Secondary | ICD-10-CM | POA: Insufficient documentation

## 2012-10-30 DIAGNOSIS — R42 Dizziness and giddiness: Secondary | ICD-10-CM | POA: Insufficient documentation

## 2012-10-30 DIAGNOSIS — R05 Cough: Secondary | ICD-10-CM | POA: Insufficient documentation

## 2012-10-30 DIAGNOSIS — R062 Wheezing: Secondary | ICD-10-CM | POA: Insufficient documentation

## 2012-10-30 LAB — COMPREHENSIVE METABOLIC PANEL
AST: 30 U/L (ref 0–37)
Albumin: 3.6 g/dL (ref 3.5–5.2)
BUN: 9 mg/dL (ref 6–23)
Chloride: 106 mEq/L (ref 96–112)
Creatinine, Ser: 0.91 mg/dL (ref 0.50–1.35)
Potassium: 3.9 mEq/L (ref 3.5–5.1)
Total Bilirubin: 0.4 mg/dL (ref 0.3–1.2)
Total Protein: 7.4 g/dL (ref 6.0–8.3)

## 2012-10-30 LAB — CBC WITH DIFFERENTIAL/PLATELET
Basophils Absolute: 0 10*3/uL (ref 0.0–0.1)
Eosinophils Relative: 5 % (ref 0–5)
Monocytes Absolute: 0.4 10*3/uL (ref 0.1–1.0)
Monocytes Relative: 12 % (ref 3–12)
Neutrophils Relative %: 67 % (ref 43–77)
Platelets: 438 10*3/uL — ABNORMAL HIGH (ref 150–400)
RBC: 4.13 MIL/uL — ABNORMAL LOW (ref 4.22–5.81)
RDW: 21.9 % — ABNORMAL HIGH (ref 11.5–15.5)
WBC: 3.5 10*3/uL — ABNORMAL LOW (ref 4.0–10.5)

## 2012-10-30 LAB — PROTIME-INR
INR: 0.97 (ref 0.00–1.49)
Prothrombin Time: 12.8 seconds (ref 11.6–15.2)

## 2012-10-30 LAB — TYPE AND SCREEN: Antibody Screen: NEGATIVE

## 2012-10-30 LAB — TROPONIN I: Troponin I: <0.3 ng/mL (ref <0.30)

## 2012-10-30 MED ORDER — ALBUTEROL SULFATE HFA 108 (90 BASE) MCG/ACT IN AERS
2.0000 | INHALATION_SPRAY | Freq: Four times a day (QID) | RESPIRATORY_TRACT | Status: DC
  Administered 2012-10-30: 2 via RESPIRATORY_TRACT

## 2012-10-30 MED ORDER — PANTOPRAZOLE SODIUM 40 MG IV SOLR
40.0000 mg | Freq: Once | INTRAVENOUS | Status: AC
  Administered 2012-10-30: 40 mg via INTRAVENOUS
  Filled 2012-10-30: qty 40

## 2012-10-30 MED ORDER — OMEPRAZOLE 20 MG PO CPDR: 20.0000 mg | DELAYED_RELEASE_CAPSULE | Freq: Every day | ORAL | Status: DC

## 2012-10-30 MED ORDER — ALBUTEROL SULFATE HFA 108 (90 BASE) MCG/ACT IN AERS
INHALATION_SPRAY | RESPIRATORY_TRACT | Status: AC
  Filled 2012-10-30: qty 6.7

## 2012-10-30 MED ORDER — SODIUM CHLORIDE 0.9 % IV SOLN
Freq: Once | INTRAVENOUS | Status: AC
  Administered 2012-10-30: 14:00:00 via INTRAVENOUS

## 2012-10-30 MED ORDER — FERROUS SULFATE 325 (65 FE) MG PO TABS: 325.0000 mg | ORAL_TABLET | Freq: Every day | ORAL | Status: DC

## 2012-10-30 MED ORDER — IPRATROPIUM BROMIDE 0.02 % IN SOLN
0.5000 mg | Freq: Once | RESPIRATORY_TRACT | Status: AC
  Administered 2012-10-30: 0.5 mg via RESPIRATORY_TRACT
  Filled 2012-10-30: qty 2.5

## 2012-10-30 MED ORDER — ALBUTEROL SULFATE (5 MG/ML) 0.5% IN NEBU
5.0000 mg | INHALATION_SOLUTION | Freq: Once | RESPIRATORY_TRACT | Status: AC
  Administered 2012-10-30: 5 mg via RESPIRATORY_TRACT
  Filled 2012-10-30: qty 1

## 2012-10-30 NOTE — ED Notes (Signed)
Ambulated pt around nurses station; O2 level stayed between 93 and 95%

## 2012-10-30 NOTE — ED Notes (Signed)
Pt reports that he was told to come to er for blood transfusion. Has been having blood in his stools for 3 weeks.

## 2012-10-30 NOTE — ED Provider Notes (Signed)
History     CSN: [id number]  Arrival date & time 10/29/12  2350   First MD Initiated Contact with Patient 10/30/12 0056      Chief Complaint  Patient presents with  . Shortness of Breath    (Consider location/radiation/quality/duration/timing/severity/associated sxs/prior treatment) HPI [name] is a 78 y.o. male with a h/o COPD and frequent visits to the ER who presents to the Emergency Department complaining of shortness of breath. As is his pattern, as he was preparing to go to bed, he became short of breath. He came directly to the ER. O2 sats upon arrival 97% on RA. Denies fever, chills, nausea, vomiting, cough, chest pain.   PCP Dr. Juanetta Gosling Past Medical History  Diagnosis Date  . COPD (chronic obstructive pulmonary disease)   . Anxiety   . Alcohol abuse   . Anemia   . Generalized headaches   . Hypertension   . 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 Pulmonary nodule/lesion, solitary   . Schizoaffective disorder   . GI bleed     History of recurrent bleeding that dates back as far as 2004 per records,    Past Surgical History  Procedure Date  . Colonoscopy Sept 2009    SLF: few small AVMs in ascending colon distal to IC . Ablated via BICAP.   Marland Kitchen Esophagogastroduodenoscopy Sept 2009    SLF: normal esophagus, small HH, 1-2 AVMs actively oozing in mid stomach, s/p BICAP, path benign   . Esophagogastroduodenoscopy 01/18/2012    Rourk-Erosive reflux esophagitis. Noncritical ring. Antral erosions/ small antral ulcer - appeared innocent  (H pylori serrology negative)  . Colonoscopy 03/09/2012    Procedure: COLONOSCOPY;  Surgeon: West Bali, MD;  Location: [facility];  Service: Endoscopy;  Laterality: N/A;  2:30  . Esophagogastroduodenoscopy 03/09/2012    Procedure: ESOPHAGOGASTRODUODENOSCOPY (EGD);  Surgeon: West Bali, MD;  Location: [facility];  Service: Endoscopy;  Laterality: N/A;    Family  History  Problem Relation Age of Onset  . Colon cancer Neg Hx     History  Substance Use Topics  . Smoking status: Former Smoker    Types: Cigarettes    Quit date: 05/07/1969  . Smokeless tobacco: Current User    Types: Snuff  . Alcohol Use: No      Review of Systems  Constitutional: Negative for fever.       10 Systems reviewed and are negative for acute change except as noted in the HPI.  HENT: Negative for congestion.   Eyes: Negative for discharge and redness.  Respiratory: Positive for shortness of breath and wheezing. Negative for cough.   Cardiovascular: Negative for chest pain.  Gastrointestinal: Negative for vomiting and abdominal pain.  Musculoskeletal: Negative for back pain.  Skin: Negative for rash.  Neurological: Negative for syncope, numbness and headaches.  Psychiatric/Behavioral:       No behavior change.    Allergies  Black pepper  Home Medications   Current Outpatient Rx  Name  Route  Sig  Dispense  Refill  . ALBUTEROL SULFATE HFA 108 (90 BASE) MCG/ACT IN AERS   Inhalation   Inhale 2 puffs into the lungs every 6 (six) hours as needed. For shortness of breath         . DIAZEPAM 5 MG PO TABS   Oral   Take 5 mg by mouth 3 (three) times daily.         Marland Kitchen  HYDROCODONE-ACETAMINOPHEN 5-325 MG PO TABS   Oral   Take 0.5-1 tablets by mouth 4 (four) times daily as needed. Pain.           BP 121/72  Pulse 94  Temp 98 F (36.7 C) (Oral)  Resp 20  Ht 5\' 4"  (1.626 m)  Wt 130 lb (58.968 kg)  BMI 22.31 kg/m2  SpO2 97%  Physical Exam  Nursing note and vitals reviewed. Constitutional: He appears well-developed and well-nourished.       Awake, alert, nontoxic appearance.  HENT:  Head: Atraumatic.  Eyes: Right eye exhibits no discharge. Left eye exhibits no discharge.  Neck: Neck supple.  Cardiovascular: Normal heart sounds.   Pulmonary/Chest: Effort normal. He has wheezes. He exhibits no tenderness.       Wheezing with forced expiration    Abdominal: Soft. There is no tenderness. There is no rebound.  Musculoskeletal: He exhibits no tenderness.       Baseline ROM, no obvious new focal weakness.  Neurological:       Mental status and motor strength appears baseline for patient and situation.  Skin: No rash noted.  Psychiatric: He has a normal mood and affect.    ED Course  Procedures (including critical care time)     MDM  Frequent patient in the ER with a h/o COPD. SOB with wheezing at home. Given albuterol/atrovent nebulizer treatment with relief. Given albuterol inhaler to take home. O2 sats remained >96% on RA. Pt stable in ED with no significant deterioration in condition.The patient appears reasonably screened and/or stabilized for discharge and I doubt any other medical condition or other [facility] requiring further screening, evaluation, or treatment in the ED at this time prior to discharge.  MDM Reviewed: nursing note, vitals and previous chart           Nicoletta Dress. Colon Branch, MD 10/30/12 [phone]

## 2012-10-30 NOTE — ED Provider Notes (Signed)
History     CSN: [id number]  Arrival date & time 10/30/12  1256   First MD Initiated Contact with Patient 10/30/12 1313      Chief Complaint  Patient presents with  . Rectal Bleeding    (Consider location/radiation/quality/duration/timing/severity/associated sxs/prior treatment) HPI Comments: Patient presents to the ED requesting blood transfusion. He is well-known to staff for frequent visits. States he had blood drawn somewhere today and he told him his counts were low. He endorses having dark black stools for several weeks. There is a generalized weakness and lightheadedness. No chest pain, nausea, vomiting, cough, chills or fever. He has known AVMs of his colon.  The history is provided by the patient.    Past Medical History  Diagnosis Date  . COPD (chronic obstructive pulmonary disease)   . Anxiety   . Alcohol abuse   . Anemia   . Generalized headaches   . Hypertension   . 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 Pulmonary nodule/lesion, solitary   . Schizoaffective disorder   . GI bleed     History of recurrent bleeding that dates back as far as 2004 per records,    Past Surgical History  Procedure Date  . Colonoscopy Sept 2009    SLF: few small AVMs in ascending colon distal to IC . Ablated via BICAP.   Marland Kitchen Esophagogastroduodenoscopy Sept 2009    SLF: normal esophagus, small HH, 1-2 AVMs actively oozing in mid stomach, s/p BICAP, path benign   . Esophagogastroduodenoscopy 01/18/2012    Rourk-Erosive reflux esophagitis. Noncritical ring. Antral erosions/ small antral ulcer - appeared innocent  (H pylori serrology negative)  . Colonoscopy 03/09/2012    Procedure: COLONOSCOPY;  Surgeon: West Bali, MD;  [facility]: [facility];  Service: Endoscopy;  Laterality: N/A;  2:30  . Esophagogastroduodenoscopy 03/09/2012    Procedure: ESOPHAGOGASTRODUODENOSCOPY (EGD);  Surgeon: West Bali, MD;  [facility];  Service: Endoscopy;  Laterality: N/A;    Family History  Problem Relation Age of Onset  . Colon cancer Neg Hx     History  Substance Use Topics  . Smoking status: Former Smoker    Types: Cigarettes    Quit date: 05/07/1969  . Smokeless tobacco: Current User    Types: Snuff  . Alcohol Use: 2.4 oz/week    4 Cans of beer per week     Comment: when ever he wants too      Review of Systems  Constitutional: Negative for fever.  Respiratory: Positive for cough and shortness of breath. Negative for chest tightness.   Cardiovascular: Negative for chest pain.  Gastrointestinal: Positive for blood in stool and hematochezia. Negative for nausea, vomiting and abdominal pain.  Neurological: Positive for weakness. Negative for dizziness.  A complete 10 system review of systems was obtained and all systems are negative except as noted in the HPI and PMH.    Allergies  Black pepper  Home Medications   Current Outpatient Rx  Name  Route  Sig  Dispense  Refill  . ALBUTEROL SULFATE HFA 108 (90 BASE) MCG/ACT IN AERS   Inhalation   Inhale 2 puffs into the lungs every 6 (six) hours as needed. For shortness of breath         . DIAZEPAM 5 MG PO TABS   Oral   Take 5 mg by mouth 3 (three) times daily.         Marland Kitchen  HYDROCODONE-ACETAMINOPHEN 5-325 MG PO TABS   Oral   Take 0.5-1 tablets by mouth 4 (four) times daily as needed. Pain.         Marland Kitchen FERROUS SULFATE 325 (65 FE) MG PO TABS   Oral   Take 1 tablet (325 mg total) by mouth daily.   30 tablet   0   . OMEPRAZOLE 20 MG PO CPDR   Oral   Take 1 capsule (20 mg total) by mouth daily.   30 capsule   0     BP 106/66  Pulse 94  Temp 97.6 F (36.4 C) (Oral)  Resp 20  SpO2 97%  Physical Exam  Constitutional: He is oriented to person, place, and time. He appears well-developed and well-nourished. No distress.  HENT:  Head: Normocephalic and atraumatic.  Mouth/Throat: Oropharynx is clear and moist. No oropharyngeal  exudate.       Pale conjunctiva  Eyes: Conjunctivae normal and EOM are normal. Pupils are equal, round, and reactive to light.  Neck: Normal range of motion. Neck supple.  Cardiovascular: Normal rate, regular rhythm and normal heart sounds.   No murmur heard. Pulmonary/Chest: Effort normal. No respiratory distress. He has wheezes.  Abdominal: Soft. There is no tenderness. There is no rebound and no guarding.  Genitourinary: Guaiac positive stool.       Dark black stools are guaiac positive, no hemorrhoids or fissures  Musculoskeletal: He exhibits no edema.  Neurological: He is alert and oriented to person, place, and time. No cranial nerve deficit. He exhibits normal muscle tone. Coordination normal.  Skin: Skin is warm.    ED Course  Procedures (including critical care time)  Labs Reviewed  CBC WITH DIFFERENTIAL - Abnormal; Notable for the following:    WBC 3.5 (*)     RBC 4.13 (*)     Hemoglobin 9.8 (*)     HCT 32.5 (*)     MCH 23.7 (*)     RDW 21.9 (*)     Platelets 438 (*)     Lymphs Abs 0.5 (*)     All other components within normal limits  COMPREHENSIVE METABOLIC PANEL - Abnormal; Notable for the following:    Glucose, Bld 103 (*)     GFR calc non Af Amer 78 (*)     All other components within normal limits  PROTIME-INR  TYPE AND SCREEN  TROPONIN I  OCCULT BLOOD X 1 CARD TO LAB, STOOL   Dg Chest 2 View  10/30/2012  *RADIOLOGY REPORT*  Clinical Data: Shortness of breath.  CHEST - 2 VIEW  Comparison: 10/21/2012.  Findings: The cardiac silhouette, mediastinal and hilar contours are within normal limits and stable.  Lungs demonstrate stable emphysematous changes.  No definite acute overlying pulmonary process.  No pleural effusion.  The bony thorax is intact.  IMPRESSION: Stable emphysematous changes.  No acute pulmonary findings.   Original Report Authenticated By: Rudie Meyer, M.D.      1. Rectal bleeding   2. AV malformation of gastrointestinal tract       MDM    Melena with history of AV malformations. Vital stable, no distress, abdomen soft nontender  Hemoglobin 9.8 which is not far from patient's baseline of 10. Orthostatics negative. Discussed with Dr. Juanetta Gosling. He states patient was in his office yesterday and had blood work done and hemoglobin was acceptable.  Patient stated he thought he would feel better with a blood transfusion so CBC was checked. Dr. Juanetta Gosling agrees patient does not need  a blood transfusion. He recommends prescribing iron which patient is unlikely to be compliant with. F/u with Dr. Juanetta Gosling and Dr. Karilyn Cota this week.    Date: 10/30/2012  Rate: 71  Rhythm: normal sinus rhythm  QRS Axis: normal  Intervals: normal  ST/T Wave abnormalities: normal  Conduction Disutrbances:none  Narrative Interpretation:   Old EKG Reviewed: unchanged      Glynn Octave, MD 10/30/12 1531

## 2012-10-30 NOTE — ED Notes (Signed)
Pt states he is ready to go home and needs a puffer. EDP notified.

## 2012-11-02 ENCOUNTER — Encounter (HOSPITAL_COMMUNITY): Payer: Self-pay | Admitting: *Deleted

## 2012-11-02 ENCOUNTER — Emergency Department (HOSPITAL_COMMUNITY)
Admission: EM | Admit: 2012-11-02 | Discharge: 2012-11-03 | Disposition: A | Discharge status: Home / Self Care | Payer: PRIVATE HEALTH INSURANCE | Attending: Emergency Medicine | Admitting: Emergency Medicine

## 2012-11-02 ENCOUNTER — Emergency Department (HOSPITAL_COMMUNITY)
Admission: EM | Admit: 2012-11-02 | Discharge: 2012-11-02 | Disposition: A | Discharge status: Home / Self Care | Payer: PRIVATE HEALTH INSURANCE | Attending: Emergency Medicine | Admitting: Emergency Medicine

## 2012-11-02 ENCOUNTER — Encounter (HOSPITAL_COMMUNITY): Payer: Self-pay

## 2012-11-02 DIAGNOSIS — Z87891 Personal history of nicotine dependence: Secondary | ICD-10-CM | POA: Insufficient documentation

## 2012-11-02 DIAGNOSIS — J441 Chronic obstructive pulmonary disease with (acute) exacerbation: Secondary | ICD-10-CM | POA: Insufficient documentation

## 2012-11-02 DIAGNOSIS — Z79899 Other long term (current) drug therapy: Secondary | ICD-10-CM | POA: Insufficient documentation

## 2012-11-02 DIAGNOSIS — F411 Generalized anxiety disorder: Secondary | ICD-10-CM | POA: Insufficient documentation

## 2012-11-02 DIAGNOSIS — R062 Wheezing: Secondary | ICD-10-CM | POA: Insufficient documentation

## 2012-11-02 DIAGNOSIS — D649 Anemia, unspecified: Secondary | ICD-10-CM

## 2012-11-02 DIAGNOSIS — F259 Schizoaffective disorder, unspecified: Secondary | ICD-10-CM | POA: Insufficient documentation

## 2012-11-02 DIAGNOSIS — F1010 Alcohol abuse, uncomplicated: Secondary | ICD-10-CM | POA: Insufficient documentation

## 2012-11-02 DIAGNOSIS — R05 Cough: Secondary | ICD-10-CM | POA: Insufficient documentation

## 2012-11-02 DIAGNOSIS — J449 Chronic obstructive pulmonary disease, unspecified: Secondary | ICD-10-CM

## 2012-11-02 DIAGNOSIS — R059 Cough, unspecified: Secondary | ICD-10-CM | POA: Insufficient documentation

## 2012-11-02 DIAGNOSIS — Z8719 Personal history of other diseases of the digestive system: Secondary | ICD-10-CM | POA: Insufficient documentation

## 2012-11-02 DIAGNOSIS — K5521 Angiodysplasia of colon with hemorrhage: Secondary | ICD-10-CM

## 2012-11-02 DIAGNOSIS — R6883 Chills (without fever): Secondary | ICD-10-CM | POA: Insufficient documentation

## 2012-11-02 DIAGNOSIS — Z8679 Personal history of other diseases of the circulatory system: Secondary | ICD-10-CM | POA: Insufficient documentation

## 2012-11-02 DIAGNOSIS — K921 Melena: Secondary | ICD-10-CM | POA: Insufficient documentation

## 2012-11-02 DIAGNOSIS — IMO0002 Reserved for concepts with insufficient information to code with codable children: Secondary | ICD-10-CM | POA: Insufficient documentation

## 2012-11-02 DIAGNOSIS — I10 Essential (primary) hypertension: Secondary | ICD-10-CM | POA: Insufficient documentation

## 2012-11-02 MED ORDER — ALBUTEROL SULFATE (5 MG/ML) 0.5% IN NEBU
0.5000 mg | INHALATION_SOLUTION | Freq: Once | RESPIRATORY_TRACT | Status: AC
  Administered 2012-11-02: 2.5 mg via RESPIRATORY_TRACT
  Filled 2012-11-02: qty 0.5

## 2012-11-02 MED ORDER — FERROUS SULFATE 325 (65 FE) MG PO TABS
325.0000 mg | ORAL_TABLET | Freq: Once | ORAL | Status: AC
  Administered 2012-11-02: 325 mg via ORAL
  Filled 2012-11-02 (×2): qty 1

## 2012-11-02 MED ORDER — IPRATROPIUM BROMIDE 0.02 % IN SOLN
0.5000 mg | Freq: Once | RESPIRATORY_TRACT | Status: AC
  Administered 2012-11-02: 0.5 mg via RESPIRATORY_TRACT
  Filled 2012-11-02: qty 2.5

## 2012-11-02 MED ORDER — ALBUTEROL SULFATE (5 MG/ML) 0.5% IN NEBU
5.0000 mg | INHALATION_SOLUTION | Freq: Once | RESPIRATORY_TRACT | Status: AC
  Administered 2012-11-02: 5 mg via RESPIRATORY_TRACT
  Filled 2012-11-02: qty 1

## 2012-11-02 MED ORDER — FERROUS SULFATE 325 (65 FE) MG PO TABS: 325.0000 mg | ORAL_TABLET | Freq: Every day | ORAL | Status: DC

## 2012-11-02 NOTE — ED Provider Notes (Signed)
History     CSN: [id number]  Arrival date & time 11/02/12  0038   First MD Initiated Contact with Patient 11/02/12 [phone]      Chief Complaint  Patient presents with  . Shortness of Breath    (Consider location/radiation/quality/duration/timing/severity/associated sxs/prior treatment) HPI... short of breath for 24 hours. Patient is regular visitor to the emergency department for breathing treatments. Nothing makes symptoms better or worse. Requested iron for his anemia.  Severity is mild. No fever, sweats, chills, rusty sputum  Past Medical History  Diagnosis Date  . COPD (chronic obstructive pulmonary disease)   . Anxiety   . Alcohol abuse   . Anemia   . Generalized headaches   . Hypertension   . 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 Pulmonary nodule/lesion, solitary   . Schizoaffective disorder   . GI bleed     History of recurrent bleeding that dates back as far as 2004 per records,    Past Surgical History  Procedure Date  . Colonoscopy Sept 2009    SLF: few small AVMs in ascending colon distal to IC . Ablated via BICAP.   Marland Kitchen Esophagogastroduodenoscopy Sept 2009    SLF: normal esophagus, small HH, 1-2 AVMs actively oozing in mid stomach, s/p BICAP, path benign   . Esophagogastroduodenoscopy 01/18/2012    Rourk-Erosive reflux esophagitis. Noncritical ring. Antral erosions/ small antral ulcer - appeared innocent  (H pylori serrology negative)  . Colonoscopy 03/09/2012    Procedure: COLONOSCOPY;  Surgeon: West Bali, MD;  Location: [facility];  Service: Endoscopy;  Laterality: N/A;  2:30  . Esophagogastroduodenoscopy 03/09/2012    Procedure: ESOPHAGOGASTRODUODENOSCOPY (EGD);  Surgeon: West Bali, MD;  [facility]: [facility];  Service: Endoscopy;  Laterality: N/A;    Family History  Problem Relation Age of Onset  . Colon cancer Neg Hx     History  Substance Use Topics  . Smoking status: Former Smoker      Types: Cigarettes    Quit date: 05/07/1969  . Smokeless tobacco: Current User    Types: Snuff  . Alcohol Use: 2.4 oz/week    4 Cans of beer per week     Comment: when ever he wants too      Review of Systems  All other systems reviewed and are negative.    Allergies  Black pepper  Home Medications   Current Outpatient Rx  Name  Route  Sig  Dispense  Refill  . ALBUTEROL SULFATE HFA 108 (90 BASE) MCG/ACT IN AERS   Inhalation   Inhale 2 puffs into the lungs every 6 (six) hours as needed. For shortness of breath         . DIAZEPAM 5 MG PO TABS   Oral   Take 5 mg by mouth 3 (three) times daily.         Marland Kitchen FERROUS SULFATE 325 (65 FE) MG PO TABS   Oral   Take 1 tablet (325 mg total) by mouth daily.   30 tablet   0   . HYDROCODONE-ACETAMINOPHEN 5-325 MG PO TABS   Oral   Take 0.5-1 tablets by mouth 4 (four) times daily as needed. Pain.         Marland Kitchen OMEPRAZOLE 20 MG PO CPDR   Oral   Take 1 capsule (20 mg total) by mouth daily.   30 capsule   0   . FERROUS SULFATE  325 (65 FE) MG PO TABS   Oral   Take 1 tablet (325 mg total) by mouth daily.   30 tablet   0     BP 127/71  Pulse 100  Resp 24  SpO2 98%  Physical Exam  Nursing note and vitals reviewed. Constitutional: He is oriented to person, place, and time. He appears well-developed and well-nourished.  HENT:  Head: Normocephalic and atraumatic.  Eyes: Conjunctivae normal and EOM are normal. Pupils are equal, round, and reactive to light.  Neck: Normal range of motion. Neck supple.  Cardiovascular: Normal rate, regular rhythm and normal heart sounds.   Pulmonary/Chest: Effort normal.       Minimal expiratory wheeze  Abdominal: Soft. Bowel sounds are normal.  Musculoskeletal: Normal range of motion.  Neurological: He is alert and oriented to person, place, and time.  Skin: Skin is warm and dry.  Psychiatric: He has a normal mood and affect.    ED Course  Procedures (including critical care  time)  Labs Reviewed - No data to display No results found.   1. COPD (chronic obstructive pulmonary disease)       MDM  Patient appears to be in his normal state. Feels better after breathing treatment. He has requested iron prescription secondary to inability to get prescription from primary care Dr.     Donnetta Hutching, MD 11/02/12 [phone]

## 2012-11-02 NOTE — ED Notes (Signed)
Patient is resting comfortably. 

## 2012-11-02 NOTE — ED Notes (Signed)
Lying quietly on stretcher, still clothed, refuses to undress. States he feels "almost as bad as yesterday". No obvious distress now

## 2012-11-02 NOTE — ED Notes (Signed)
Pt states he is more sob than usual

## 2012-11-02 NOTE — ED Provider Notes (Signed)
History     CSN: [id number]  Arrival date & time 11/02/12  2227   First MD Initiated Contact with Patient 11/02/12 2229      Chief Complaint  Patient presents with  . Shortness of Breath  . Wheezing    (Consider location/radiation/quality/duration/timing/severity/associated sxs/prior treatment) HPI Comments: [name] is a 79 y.o. Male with a long standing history of shortness of breath secondary to COPD and a frequent visitor to the emergency room for breathing treatments.  He was at home this evening when he developed increased shortness of breath with wheezing.  He has had no fevers, chills but has a chronic nonproductive cough.  Additionally,  He has a history of AVN with chronic GI blood loss and he has complaint of increasing chills which is indicative of his need for a blood transfusion.  His stools are chronically dark,  Denies seeing any visible blood.   He was seen here 3 days ago at which time his hemoglobin was 9.8 and was hemoccult positive and after discussion with his pcp, the plan was for him to see his pcp and his GI doctor Dr Karilyn Cota this week and to start iron supplementation which has not yet occurred.  He denies weakness and dizziness today,  Simply feels cold.   The history is provided by the patient.    Past Medical History  Diagnosis Date  . COPD (chronic obstructive pulmonary disease)   . Anxiety   . Alcohol abuse   . Anemia   . Generalized headaches   . Hypertension   . 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 Pulmonary nodule/lesion, solitary   . Schizoaffective disorder   . GI bleed     History of recurrent bleeding that dates back as far as 2004 per records,    Past Surgical History  Procedure Date  . Colonoscopy Sept 2009    SLF: few small AVMs in ascending colon distal to IC . Ablated via BICAP.   Marland Kitchen Esophagogastroduodenoscopy Sept 2009    SLF: normal esophagus, small HH, 1-2 AVMs  actively oozing in mid stomach, s/p BICAP, path benign   . Esophagogastroduodenoscopy 01/18/2012    Rourk-Erosive reflux esophagitis. Noncritical ring. Antral erosions/ small antral ulcer - appeared innocent  (H pylori serrology negative)  . Colonoscopy 03/09/2012    Procedure: COLONOSCOPY;  Surgeon: West Bali, MD;  [facility]: [facility];  Service: Endoscopy;  Laterality: N/A;  2:30  . Esophagogastroduodenoscopy 03/09/2012    Procedure: ESOPHAGOGASTRODUODENOSCOPY (EGD);  Surgeon: West Bali, MD;  [facility]: [facility];  Service: Endoscopy;  Laterality: N/A;    Family History  Problem Relation Age of Onset  . Colon cancer Neg Hx     History  Substance Use Topics  . Smoking status: Former Smoker    Types: Cigarettes    Quit date: 05/07/1969  . Smokeless tobacco: Current User    Types: Snuff  . Alcohol Use: 2.4 oz/week    4 Cans of beer per week     Comment: when ever he wants too      Review of Systems  Constitutional: Positive for chills.  Respiratory: Positive for cough, shortness of breath and wheezing.   Cardiovascular: Negative for chest pain.  Gastrointestinal: Positive for blood in stool. Negative for vomiting and diarrhea.  Neurological: Negative for weakness.    Allergies  Black pepper  Home Medications   Current Outpatient Rx  Name  Route  Sig  Dispense  Refill  . ALBUTEROL SULFATE HFA 108 (90 BASE) MCG/ACT IN AERS   Inhalation   Inhale 2 puffs into the lungs every 6 (six) hours as needed. For shortness of breath         . DIAZEPAM 5 MG PO TABS   Oral   Take 5 mg by mouth 3 (three) times daily.         Marland Kitchen FERROUS SULFATE 325 (65 FE) MG PO TABS   Oral   Take 1 tablet (325 mg total) by mouth daily.   30 tablet   0   . HYDROCODONE-ACETAMINOPHEN 5-325 MG PO TABS   Oral   Take 0.5-1 tablets by mouth 4 (four) times daily as needed. Pain.         Marland Kitchen OMEPRAZOLE 20 MG PO CPDR   Oral   Take 1 capsule (20 mg total) by mouth daily.   30  capsule   0     BP 130/75  Pulse 97  Temp 98 F (36.7 C) (Oral)  Wt 130 lb (58.968 kg)  SpO2 90%  Physical Exam  Nursing note and vitals reviewed. Constitutional: He appears well-developed and well-nourished.  HENT:  Head: Normocephalic and atraumatic.  Eyes: Conjunctivae normal are normal.       Pale conjunctiva.  Neck: Normal range of motion.  Cardiovascular: Normal rate, regular rhythm, normal heart sounds and intact distal pulses.   Pulmonary/Chest: Effort normal. No respiratory distress. He has wheezes. He exhibits no tenderness.       Expiratory wheeze bilateral.    Abdominal: Soft. Bowel sounds are normal. There is no tenderness.  Musculoskeletal: Normal range of motion.  Neurological: He is alert.  Skin: Skin is warm and dry.       Finger tips are pale and cool.    Psychiatric: He has a normal mood and affect.    ED Course  Procedures (including critical care time)  Labs Reviewed  HEMOGLOBIN AND HEMATOCRIT, BLOOD - Abnormal; Notable for the following:    Hemoglobin 8.4 (*)     HCT 27.3 (*)     All other components within normal limits  PREPARE RBC (CROSSMATCH)  TYPE AND SCREEN   No results found.   1. COPD (chronic obstructive pulmonary disease)   2. AVM (arteriovenous malformation) of colon with hemorrhage   3. Anemia      Pt was given albuterol and atrovent neb tx x 1 with improvement in wheezing.   MDM  Discussed with Dr Manus Gunning .  With lower hemoglobin, will plan to transfuse 1 unit of prbc's in ed,  Then plan for dc home with close f/u with pcp,  Assuming pt feels improved at that time.  Pt agreeable with plan.  Discussed with Dr Adriana Simas who will follow pt until dispo.        Burgess Amor, [place] 11/03/12 [phone]

## 2012-11-02 NOTE — ED Notes (Signed)
Pt co sob and wheezing for 2 days, worse today

## 2012-11-03 LAB — PREPARE RBC (CROSSMATCH)

## 2012-11-03 NOTE — ED Provider Notes (Signed)
Medical screening examination/treatment/procedure(s) were performed by non-physician practitioner and as supervising physician I was immediately available for consultation/collaboration.  Glynn Octave, MD 11/03/12 [phone]

## 2012-11-03 NOTE — ED Notes (Signed)
Sleeping soundly 

## 2012-11-03 NOTE — ED Notes (Signed)
Pt sleeping. 

## 2012-11-03 NOTE — ED Notes (Signed)
sleeping

## 2012-11-03 NOTE — ED Notes (Addendum)
Improving, awake and demanding. Wants the door closed and more blankets. Pt's gown needed a change due to sweating under several blankets. Pt has NOT been febrile.current temp 98.4  I asked pt about his current home situation. Pt lives with his 3 adult sons.  Pt states there is no central heat in his home and only one small heater for the whole house.  Current outside temps are in the teens and the forecast for next several days is below freezing. Plan now is to call pt's son's @ discharge to make sure he has a ride home and to contact Adult Protective Services this morning to have them do welfare check @ pt's residence.

## 2012-11-03 NOTE — ED Notes (Signed)
Tried to call pt's sons but there was no answer and was unable to leave message.

## 2012-11-03 NOTE — ED Notes (Signed)
Pt given breakfast tray.  nad noted

## 2012-11-04 LAB — TYPE AND SCREEN: Unit division: 0

## 2012-11-08 ENCOUNTER — Encounter (HOSPITAL_COMMUNITY): Payer: Self-pay | Admitting: *Deleted

## 2012-11-08 ENCOUNTER — Emergency Department (HOSPITAL_COMMUNITY)
Admission: EM | Admit: 2012-11-08 | Discharge: 2012-11-08 | Disposition: A | Discharge status: Home / Self Care | Payer: PRIVATE HEALTH INSURANCE | Attending: Emergency Medicine | Admitting: Emergency Medicine

## 2012-11-08 DIAGNOSIS — F259 Schizoaffective disorder, unspecified: Secondary | ICD-10-CM | POA: Insufficient documentation

## 2012-11-08 DIAGNOSIS — Z79899 Other long term (current) drug therapy: Secondary | ICD-10-CM | POA: Insufficient documentation

## 2012-11-08 DIAGNOSIS — J4489 Other specified chronic obstructive pulmonary disease: Secondary | ICD-10-CM | POA: Insufficient documentation

## 2012-11-08 DIAGNOSIS — I10 Essential (primary) hypertension: Secondary | ICD-10-CM | POA: Insufficient documentation

## 2012-11-08 DIAGNOSIS — Z8709 Personal history of other diseases of the respiratory system: Secondary | ICD-10-CM | POA: Insufficient documentation

## 2012-11-08 DIAGNOSIS — F411 Generalized anxiety disorder: Secondary | ICD-10-CM | POA: Insufficient documentation

## 2012-11-08 DIAGNOSIS — R6889 Other general symptoms and signs: Secondary | ICD-10-CM | POA: Insufficient documentation

## 2012-11-08 DIAGNOSIS — J449 Chronic obstructive pulmonary disease, unspecified: Secondary | ICD-10-CM | POA: Insufficient documentation

## 2012-11-08 DIAGNOSIS — T17308A Unspecified foreign body in larynx causing other injury, initial encounter: Secondary | ICD-10-CM

## 2012-11-08 DIAGNOSIS — Z8719 Personal history of other diseases of the digestive system: Secondary | ICD-10-CM | POA: Insufficient documentation

## 2012-11-08 DIAGNOSIS — Z87891 Personal history of nicotine dependence: Secondary | ICD-10-CM | POA: Insufficient documentation

## 2012-11-08 DIAGNOSIS — D649 Anemia, unspecified: Secondary | ICD-10-CM | POA: Insufficient documentation

## 2012-11-08 DIAGNOSIS — F1010 Alcohol abuse, uncomplicated: Secondary | ICD-10-CM | POA: Insufficient documentation

## 2012-11-08 NOTE — ED Notes (Signed)
Sob, brought in by EMS with 02.by cannula,  Alert,

## 2012-11-08 NOTE — ED Provider Notes (Signed)
History   This chart was scribed for Joya Gaskins, MD, by Frederik Pear, ER scribe. The patient was seen in room APA01/APA01 and the patient's care was started at 2223.    CSN: [id number]  Arrival date & time 11/08/12  1950   First MD Initiated Contact with Patient 11/08/12 2223      Chief Complaint  Patient presents with  . Shortness of Breath     Patient is a 79 y.o. male presenting with shortness of breath. The history is provided by the patient. No language interpreter was used.  Shortness of Breath  The current episode started today. The onset was sudden. The problem occurs continuously. The problem has been gradually improving. Nothing relieves the symptoms. Nothing aggravates the symptoms. Associated symptoms include shortness of breath. Pertinent negatives include no fever.    [name] is a 79 y.o. male brought in by EMS who presents to the Emergency Department complaining of gradually improving, mild SOB that began PTA after he got a pill stuck in his throat. He denies any LOC, abdominal pain,  or chest pain.  He reports his symptoms have resolved   Past Medical History  Diagnosis Date  . COPD (chronic obstructive pulmonary disease)   . Anxiety   . Alcohol abuse   . Anemia   . Generalized headaches   . Hypertension   . 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 Pulmonary nodule/lesion, solitary   . Schizoaffective disorder   . GI bleed     History of recurrent bleeding that dates back as far as 2004 per records,    Past Surgical History  Procedure Date  . Colonoscopy Sept 2009    SLF: few small AVMs in ascending colon distal to IC . Ablated via BICAP.   Marland Kitchen Esophagogastroduodenoscopy Sept 2009    SLF: normal esophagus, small HH, 1-2 AVMs actively oozing in mid stomach, s/p BICAP, path benign   . Esophagogastroduodenoscopy 01/18/2012    Rourk-Erosive reflux esophagitis. Noncritical ring. Antral erosions/  small antral ulcer - appeared innocent  (H pylori serrology negative)  . Colonoscopy 03/09/2012    Procedure: COLONOSCOPY;  Surgeon: West Bali, MD;  Location: [facility];  Service: Endoscopy;  Laterality: N/A;  2:30  . Esophagogastroduodenoscopy 03/09/2012    Procedure: ESOPHAGOGASTRODUODENOSCOPY (EGD);  Surgeon: West Bali, MD;  Location: [facility];  Service: Endoscopy;  Laterality: N/A;    Family History  Problem Relation Age of Onset  . Colon cancer Neg Hx     History  Substance Use Topics  . Smoking status: Former Smoker    Types: Cigarettes    Quit date: 05/07/1969  . Smokeless tobacco: Current User    Types: Snuff  . Alcohol Use: 2.4 oz/week    4 Cans of beer per week     Comment: when ever he wants too      Review of Systems  Constitutional: Negative for fever.  Respiratory: Positive for shortness of breath.     Allergies  Black pepper  Home Medications   Current Outpatient Rx  Name  Route  Sig  Dispense  Refill  . ALBUTEROL SULFATE HFA 108 (90 BASE) MCG/ACT IN AERS   Inhalation   Inhale 2 puffs into the lungs every 6 (six) hours as needed. For shortness of breath         . DIAZEPAM 5 MG PO TABS   Oral  Take 5 mg by mouth 3 (three) times daily.         Marland Kitchen FERROUS SULFATE 325 (65 FE) MG PO TABS   Oral   Take 1 tablet (325 mg total) by mouth daily.   30 tablet   0   . HYDROCODONE-ACETAMINOPHEN 5-325 MG PO TABS   Oral   Take 0.5-1 tablets by mouth 4 (four) times daily as needed. Pain.         Marland Kitchen OMEPRAZOLE 20 MG PO CPDR   Oral   Take 1 capsule (20 mg total) by mouth daily.   30 capsule   0     BP 113/59  Pulse 95  Temp 97.7 F (36.5 C) (Oral)  Resp 22  Ht 5\' 1"  (1.549 m)  Wt 125 lb (56.7 kg)  BMI 23.62 kg/m2  SpO2 98%  Physical Exam  CONSTITUTIONAL: Well developed/well nourished HEAD AND FACE: Normocephalic/atraumatic EYES: EOMI/PERRL ENMT: Mucous membranes moist, no stridor, voice normal NECK: supple no meningeal  signs SPINE:entire spine nontender CV: S1/S2 noted, no murmurs/rubs/gallops noted LUNGS: Lungs have scattered, but no apparent distress ABDOMEN: soft, nontender, no rebound or guarding NEURO: Pt is awake/alert, moves all extremitiesx4.  Pt walking around the ER in no distress EXTREMITIES: pulses normal, full ROM SKIN: warm, color normal PSYCH: no abnormalities of mood noted  ED Course  Procedures   DIAGNOSTIC STUDIES: Oxygen Saturation is 98% on room air, normal by my interpretation.    COORDINATION OF CARE:  22:35- Discussed planned course of treatment with the patient, who is agreeable at this time.  Pt requesting discharge.  No distress noted    MDM  Nursing notes including past medical history and social history reviewed and considered in documentation   I personally performed the services described in this documentation, which was scribed in my presence. The recorded information has been reviewed and is accurate.          Joya Gaskins, MD 11/08/12 [phone]

## 2012-11-10 ENCOUNTER — Emergency Department (HOSPITAL_COMMUNITY)
Admission: EM | Admit: 2012-11-10 | Discharge: 2012-11-10 | Discharge status: Against Medical Advice | Payer: PRIVATE HEALTH INSURANCE | Attending: Emergency Medicine | Admitting: Emergency Medicine

## 2012-11-10 ENCOUNTER — Encounter (HOSPITAL_COMMUNITY): Payer: Self-pay

## 2012-11-10 ENCOUNTER — Emergency Department (HOSPITAL_COMMUNITY): Payer: PRIVATE HEALTH INSURANCE

## 2012-11-10 DIAGNOSIS — J4489 Other specified chronic obstructive pulmonary disease: Secondary | ICD-10-CM | POA: Insufficient documentation

## 2012-11-10 DIAGNOSIS — F411 Generalized anxiety disorder: Secondary | ICD-10-CM | POA: Insufficient documentation

## 2012-11-10 DIAGNOSIS — D649 Anemia, unspecified: Secondary | ICD-10-CM | POA: Insufficient documentation

## 2012-11-10 DIAGNOSIS — Z8719 Personal history of other diseases of the digestive system: Secondary | ICD-10-CM | POA: Insufficient documentation

## 2012-11-10 DIAGNOSIS — F10929 Alcohol use, unspecified with intoxication, unspecified: Secondary | ICD-10-CM

## 2012-11-10 DIAGNOSIS — Z87891 Personal history of nicotine dependence: Secondary | ICD-10-CM | POA: Insufficient documentation

## 2012-11-10 DIAGNOSIS — F1010 Alcohol abuse, uncomplicated: Secondary | ICD-10-CM | POA: Insufficient documentation

## 2012-11-10 DIAGNOSIS — I10 Essential (primary) hypertension: Secondary | ICD-10-CM | POA: Insufficient documentation

## 2012-11-10 DIAGNOSIS — J449 Chronic obstructive pulmonary disease, unspecified: Secondary | ICD-10-CM | POA: Insufficient documentation

## 2012-11-10 DIAGNOSIS — Z79899 Other long term (current) drug therapy: Secondary | ICD-10-CM | POA: Insufficient documentation

## 2012-11-10 DIAGNOSIS — Z8709 Personal history of other diseases of the respiratory system: Secondary | ICD-10-CM | POA: Insufficient documentation

## 2012-11-10 DIAGNOSIS — Z8659 Personal history of other mental and behavioral disorders: Secondary | ICD-10-CM | POA: Insufficient documentation

## 2012-11-10 LAB — CBC WITH DIFFERENTIAL/PLATELET
Basophils Absolute: 0 10*3/uL (ref 0.0–0.1)
Eosinophils Relative: 5 % (ref 0–5)
HCT: 29.7 % — ABNORMAL LOW (ref 39.0–52.0)
Lymphocytes Relative: 21 % (ref 12–46)
Lymphs Abs: 0.7 10*3/uL (ref 0.7–4.0)
MCV: 83.2 fL (ref 78.0–100.0)
Monocytes Absolute: 0.5 10*3/uL (ref 0.1–1.0)
Neutro Abs: 2.1 10*3/uL (ref 1.7–7.7)
RBC: 3.57 MIL/uL — ABNORMAL LOW (ref 4.22–5.81)
RDW: 23.3 % — ABNORMAL HIGH (ref 11.5–15.5)
WBC: 3.5 10*3/uL — ABNORMAL LOW (ref 4.0–10.5)

## 2012-11-10 LAB — ETHANOL: Alcohol, Ethyl (B): 36 mg/dL — ABNORMAL HIGH (ref 0–11)

## 2012-11-10 LAB — BASIC METABOLIC PANEL
CO2: 25 mEq/L (ref 19–32)
Calcium: 8.9 mg/dL (ref 8.4–10.5)
Chloride: 106 mEq/L (ref 96–112)
Glucose, Bld: 68 mg/dL — ABNORMAL LOW (ref 70–99)
Sodium: 138 mEq/L (ref 135–145)

## 2012-11-10 NOTE — ED Notes (Signed)
Pt arrived by ems, sob that started today, out of his inhalers, became sob today. Was sitting on porch at ems arrival to home.

## 2012-11-10 NOTE — ED Notes (Signed)
Pt arrived by ems, refused to change into gown, refused any monitors to be placed. Strong odor of etoh on pt's breath, when asked if he had been drinking stated half a gallon.  Was given breathing treatment by ems pta, stated ":my breathing is better", has multiple inhalers in his pockets.
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
## 2012-11-10 NOTE — ED Notes (Signed)
Patient walked out of ED stating "I don't have time to sit up here all night." Patient encouraged to stay. Refused to sign AMA papers. Explained risks of leaving to patient.

## 2012-11-10 NOTE — ED Notes (Signed)
Patient is starting to be agitated security

## 2012-11-10 NOTE — ED Notes (Signed)
Patient is resting comfortably. 

## 2012-11-10 NOTE — ED Provider Notes (Signed)
History   This chart was scribed for Hilario Quarry, MD by Leone Payor, ED Scribe. This patient was seen in room APA08/APA08 and the patient's care was started at 1512.   CSN: [id number]  Arrival date & time 11/10/12  1431   First MD Initiated Contact with Patient 11/10/12 1512      Chief Complaint  Patient presents with  . Shortness of Breath     The history is provided by the patient. No language interpreter was used.      [name] is a 79 y.o. male brought in by ambulance, who presents to the Emergency Department complaining of continuous SOB with current episode starting today . Pt has h/o SOB and has been seen here in the ED for similar symptoms multiple times in the recent past. He reports living with his son. Pt may have an oxygen or albuterol machine at home. Pt did not clarify which. He denies fever, cough, abdominal pain. Per nursing note, pt reports being out of his inhalers.   PCP is Dr. Wonda Amis.  Pt has h/o COPD, HTN.  Pt is a former smoker and regularly uses alcohol.  Past Medical History  Diagnosis Date  . COPD (chronic obstructive pulmonary disease)   . Anxiety   . Alcohol abuse   . Anemia   . Generalized headaches   . Hypertension   . 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 Pulmonary nodule/lesion, solitary   . Schizoaffective disorder   . GI bleed     History of recurrent bleeding that dates back as far as 2004 per records,    Past Surgical History  Procedure Date  . Colonoscopy Sept 2009    SLF: few small AVMs in ascending colon distal to IC . Ablated via BICAP.   Marland Kitchen Esophagogastroduodenoscopy Sept 2009    SLF: normal esophagus, small HH, 1-2 AVMs actively oozing in mid stomach, s/p BICAP, path benign   . Esophagogastroduodenoscopy 01/18/2012    Rourk-Erosive reflux esophagitis. Noncritical ring. Antral erosions/ small antral ulcer - appeared innocent  (H pylori serrology negative)  .  Colonoscopy 03/09/2012    Procedure: COLONOSCOPY;  Surgeon: West Bali, MD;  Location: [facility];  Service: Endoscopy;  Laterality: N/A;  2:30  . Esophagogastroduodenoscopy 03/09/2012    Procedure: ESOPHAGOGASTRODUODENOSCOPY (EGD);  Surgeon: West Bali, MD;  Location: [facility];  Service: Endoscopy;  Laterality: N/A;    Family History  Problem Relation Age of Onset  . Colon cancer Neg Hx     History  Substance Use Topics  . Smoking status: Former Smoker    Types: Cigarettes    Quit date: 05/07/1969  . Smokeless tobacco: Current User    Types: Snuff  . Alcohol Use: 2.4 oz/week    4 Cans of beer per week     Comment: when ever he wants too      Review of Systems  Constitutional: Negative for fever.  Respiratory: Negative for cough.   Gastrointestinal: Negative for abdominal pain.    Allergies  Black pepper  Home Medications   Current Outpatient Rx  Name  Route  Sig  Dispense  Refill  . ALBUTEROL SULFATE HFA 108 (90 BASE) MCG/ACT IN AERS   Inhalation   Inhale 2 puffs into the lungs every 6 (six) hours as needed. For shortness of breath         . DIAZEPAM 5 MG  PO TABS   Oral   Take 5 mg by mouth 3 (three) times daily.         Marland Kitchen FERROUS SULFATE 325 (65 FE) MG PO TABS   Oral   Take 1 tablet (325 mg total) by mouth daily.   30 tablet   0   . HYDROCODONE-ACETAMINOPHEN 5-325 MG PO TABS   Oral   Take 0.5-1 tablets by mouth 4 (four) times daily as needed. Pain.         Marland Kitchen OMEPRAZOLE 20 MG PO CPDR   Oral   Take 1 capsule (20 mg total) by mouth daily.   30 capsule   0     BP 106/63  Temp 98.3 F (36.8 C) (Oral)  Resp 20  SpO2 96%  Physical Exam  Nursing note and vitals reviewed. Constitutional:       Awake, alert, nontoxic appearance.  HENT:  Head: Atraumatic.  Eyes: Right eye exhibits no discharge. Left eye exhibits no discharge.  Neck: Neck supple.  Pulmonary/Chest: Effort normal. He has wheezes. He exhibits no tenderness.        Decreased air movement and wheezes during expirations.   Abdominal: Soft. There is no tenderness. There is no rebound.  Musculoskeletal: He exhibits no tenderness.       Baseline ROM, no obvious new focal weakness.  Neurological:       Mental status and motor strength appears baseline for patient and situation.  Skin: No rash noted.  Psychiatric: He has a normal mood and affect.    ED Course  Procedures (including critical care time)  DIAGNOSTIC STUDIES: Oxygen Saturation is 96% on room air, adequate by my interpretation.    COORDINATION OF CARE:   3:14 PM Discussed treatment plan which includes CBC panel, basic metabolic panel with pt at bedside and pt agreed to plan.    Labs Reviewed  ETHANOL - Abnormal; Notable for the following:    Alcohol, Ethyl (B) 36 (*)     All other components within normal limits  CBC WITH DIFFERENTIAL - Abnormal; Notable for the following:    WBC 3.5 (*)     RBC 3.57 (*)     Hemoglobin 9.2 (*)     HCT 29.7 (*)     MCH 25.8 (*)     RDW 23.3 (*)     Monocytes Relative 14 (*)     All other components within normal limits  BASIC METABOLIC PANEL - Abnormal; Notable for the following:    Potassium 3.4 (*)     Glucose, Bld 68 (*)     GFR calc non Af Amer 83 (*)     All other components within normal limits   No results found.   No diagnosis found.    Patient left [facility] after md evaluation without notification.    Hilario Quarry, MD 11/11/12 1950

## 2012-11-12 ENCOUNTER — Emergency Department (HOSPITAL_COMMUNITY)
Admission: EM | Admit: 2012-11-12 | Discharge: 2012-11-12 | Discharge status: Against Medical Advice | Payer: PRIVATE HEALTH INSURANCE | Attending: Emergency Medicine | Admitting: Emergency Medicine

## 2012-11-12 ENCOUNTER — Encounter (HOSPITAL_COMMUNITY): Payer: Self-pay | Admitting: *Deleted

## 2012-11-12 DIAGNOSIS — Z79899 Other long term (current) drug therapy: Secondary | ICD-10-CM | POA: Insufficient documentation

## 2012-11-12 DIAGNOSIS — D649 Anemia, unspecified: Secondary | ICD-10-CM | POA: Insufficient documentation

## 2012-11-12 DIAGNOSIS — F1010 Alcohol abuse, uncomplicated: Secondary | ICD-10-CM | POA: Insufficient documentation

## 2012-11-12 DIAGNOSIS — I10 Essential (primary) hypertension: Secondary | ICD-10-CM | POA: Insufficient documentation

## 2012-11-12 DIAGNOSIS — Z8719 Personal history of other diseases of the digestive system: Secondary | ICD-10-CM | POA: Insufficient documentation

## 2012-11-12 DIAGNOSIS — F411 Generalized anxiety disorder: Secondary | ICD-10-CM | POA: Insufficient documentation

## 2012-11-12 DIAGNOSIS — J441 Chronic obstructive pulmonary disease with (acute) exacerbation: Secondary | ICD-10-CM | POA: Insufficient documentation

## 2012-11-12 DIAGNOSIS — Z87891 Personal history of nicotine dependence: Secondary | ICD-10-CM | POA: Insufficient documentation

## 2012-11-12 DIAGNOSIS — J449 Chronic obstructive pulmonary disease, unspecified: Secondary | ICD-10-CM

## 2012-11-12 DIAGNOSIS — Z8659 Personal history of other mental and behavioral disorders: Secondary | ICD-10-CM | POA: Insufficient documentation

## 2012-11-12 MED ORDER — ALBUTEROL SULFATE HFA 108 (90 BASE) MCG/ACT IN AERS
2.0000 | INHALATION_SPRAY | RESPIRATORY_TRACT | Status: DC | PRN
  Administered 2012-11-12: 2 via RESPIRATORY_TRACT
  Filled 2012-11-12: qty 6.7

## 2012-11-12 NOTE — ED Notes (Addendum)
SOB began this afternoon. NAD. VSS.  ? ETOH. Pt states he has only had one beer today

## 2012-11-12 NOTE — ED Notes (Signed)
Patient now states he has had two beers today.
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
## 2012-11-24 ENCOUNTER — Emergency Department (HOSPITAL_COMMUNITY): Payer: PRIVATE HEALTH INSURANCE

## 2012-11-24 ENCOUNTER — Emergency Department (HOSPITAL_COMMUNITY)
Admission: EM | Admit: 2012-11-24 | Discharge: 2012-11-24 | Disposition: A | Discharge status: Home / Self Care | Payer: PRIVATE HEALTH INSURANCE | Attending: Emergency Medicine | Admitting: Emergency Medicine

## 2012-11-24 ENCOUNTER — Encounter (HOSPITAL_COMMUNITY): Payer: Self-pay | Admitting: *Deleted

## 2012-11-24 DIAGNOSIS — F1010 Alcohol abuse, uncomplicated: Secondary | ICD-10-CM | POA: Insufficient documentation

## 2012-11-24 DIAGNOSIS — Z79899 Other long term (current) drug therapy: Secondary | ICD-10-CM | POA: Insufficient documentation

## 2012-11-24 DIAGNOSIS — J441 Chronic obstructive pulmonary disease with (acute) exacerbation: Secondary | ICD-10-CM | POA: Insufficient documentation

## 2012-11-24 DIAGNOSIS — R059 Cough, unspecified: Secondary | ICD-10-CM | POA: Insufficient documentation

## 2012-11-24 DIAGNOSIS — F411 Generalized anxiety disorder: Secondary | ICD-10-CM | POA: Insufficient documentation

## 2012-11-24 DIAGNOSIS — F259 Schizoaffective disorder, unspecified: Secondary | ICD-10-CM | POA: Insufficient documentation

## 2012-11-24 DIAGNOSIS — Z87891 Personal history of nicotine dependence: Secondary | ICD-10-CM | POA: Insufficient documentation

## 2012-11-24 DIAGNOSIS — Z8709 Personal history of other diseases of the respiratory system: Secondary | ICD-10-CM | POA: Insufficient documentation

## 2012-11-24 DIAGNOSIS — Z8669 Personal history of other diseases of the nervous system and sense organs: Secondary | ICD-10-CM | POA: Insufficient documentation

## 2012-11-24 DIAGNOSIS — I10 Essential (primary) hypertension: Secondary | ICD-10-CM | POA: Insufficient documentation

## 2012-11-24 DIAGNOSIS — R05 Cough: Secondary | ICD-10-CM | POA: Insufficient documentation

## 2012-11-24 DIAGNOSIS — J449 Chronic obstructive pulmonary disease, unspecified: Secondary | ICD-10-CM

## 2012-11-24 DIAGNOSIS — Z8719 Personal history of other diseases of the digestive system: Secondary | ICD-10-CM | POA: Insufficient documentation

## 2012-11-24 DIAGNOSIS — D649 Anemia, unspecified: Secondary | ICD-10-CM | POA: Insufficient documentation

## 2012-11-24 LAB — CBC WITH DIFFERENTIAL/PLATELET
Eosinophils Absolute: 0.1 10*3/uL (ref 0.0–0.7)
Eosinophils Relative: 2 % (ref 0–5)
HCT: 36.4 % — ABNORMAL LOW (ref 39.0–52.0)
Lymphocytes Relative: 17 % (ref 12–46)
Lymphs Abs: 0.9 10*3/uL (ref 0.7–4.0)
MCH: 27.1 pg (ref 26.0–34.0)
MCV: 87.3 fL (ref 78.0–100.0)
Monocytes Absolute: 0.9 10*3/uL (ref 0.1–1.0)
Monocytes Relative: 17 % — ABNORMAL HIGH (ref 3–12)
RBC: 4.17 MIL/uL — ABNORMAL LOW (ref 4.22–5.81)
WBC: 5.2 10*3/uL (ref 4.0–10.5)

## 2012-11-24 LAB — BASIC METABOLIC PANEL
BUN: 9 mg/dL (ref 6–23)
CO2: 28 mEq/L (ref 19–32)
Calcium: 8.9 mg/dL (ref 8.4–10.5)
Creatinine, Ser: 0.84 mg/dL (ref 0.50–1.35)
GFR calc non Af Amer: 81 mL/min — ABNORMAL LOW (ref >90)
Glucose, Bld: 101 mg/dL — ABNORMAL HIGH (ref 70–99)

## 2012-11-24 MED ORDER — PREDNISONE 50 MG PO TABS
60.0000 mg | ORAL_TABLET | Freq: Once | ORAL | Status: AC
  Administered 2012-11-24: 60 mg via ORAL
  Filled 2012-11-24: qty 1

## 2012-11-24 MED ORDER — ALBUTEROL SULFATE (5 MG/ML) 0.5% IN NEBU
5.0000 mg | INHALATION_SOLUTION | Freq: Once | RESPIRATORY_TRACT | Status: AC
  Administered 2012-11-24: 5 mg via RESPIRATORY_TRACT
  Filled 2012-11-24: qty 1

## 2012-11-24 MED ORDER — ALBUTEROL SULFATE HFA 108 (90 BASE) MCG/ACT IN AERS: 2.0000 | INHALATION_SPRAY | RESPIRATORY_TRACT | Status: DC

## 2012-11-24 MED ORDER — ALBUTEROL SULFATE HFA 108 (90 BASE) MCG/ACT IN AERS
2.0000 | INHALATION_SPRAY | RESPIRATORY_TRACT | Status: DC | PRN
  Administered 2012-11-24: 2 via RESPIRATORY_TRACT
  Filled 2012-11-24: qty 6.7

## 2012-11-24 MED ORDER — PREDNISONE 20 MG PO TABS: 40.0000 mg | ORAL_TABLET | Freq: Every day | ORAL | Status: DC

## 2012-11-24 MED ORDER — IPRATROPIUM BROMIDE 0.02 % IN SOLN
0.5000 mg | Freq: Once | RESPIRATORY_TRACT | Status: AC
Start: 2012-11-24 — End: 2012-11-24
  Administered 2012-11-24: 0.5 mg via RESPIRATORY_TRACT
  Filled 2012-11-24: qty 2.5

## 2012-11-24 NOTE — ED Notes (Signed)
Pt now reports that he is having pain on right lower chest area, pain started last night,

## 2012-11-24 NOTE — ED Notes (Signed)
Patient refuses to have any more blood drawn-stating "y'all took 4 tubes last time and I felt drunk when I left here. If I could drive I wouldn't come here." EDP made aware.

## 2012-11-24 NOTE — ED Provider Notes (Addendum)
History   This chart was scribed for [name] Sprout, MD by [name], ED Scribe. The patient was seen in room APA08/APA08. Patient's care was started at 1045.  CSN: [id number]  Arrival date & time 11/24/12  1045   First MD Initiated Contact with Patient 11/24/12 1054      Chief Complaint  Patient presents with  . Shortness of Breath   Patient is a 79 y.o. male presenting with shortness of breath. The history is provided by the patient. No language interpreter was used.  Shortness of Breath  The current episode started yesterday. The onset was gradual. The problem occurs continuously. The problem has been gradually worsening. The problem is moderate. Nothing relieves the symptoms. Associated symptoms include cough and shortness of breath. He has had intermittent steroid use. Recently, medical care has been given by the PCP. Services received include medications given.    [name] is a 79 y.o. male with a h/o COPD, anemia, HTN, and who presents to the Emergency Department complaining of 1 day of recurrent, progressive, moderate SOB with mild non-productive cough. Symptoms are neither alleviated nor aggravated by anything, and have not been treated PTA. He denies pain. No fever, chills, cough, congestion, rhinorrhea, chest pain, or n/v/d. Pt denies use of tobacco, alcohol, and illicit drugs.     Past Medical History  Diagnosis Date  . COPD (chronic obstructive pulmonary disease)   . Anxiety   . Alcohol abuse   . Anemia   . Generalized headaches   . Hypertension   . 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 Pulmonary nodule/lesion, solitary   . Schizoaffective disorder   . GI bleed     History of recurrent bleeding that dates back as far as 2004 per records,    Past Surgical History  Procedure Date  . Colonoscopy Sept 2009    SLF: few small AVMs in ascending colon distal to IC . Ablated via BICAP.   Marland Kitchen  Esophagogastroduodenoscopy Sept 2009    SLF: normal esophagus, small HH, 1-2 AVMs actively oozing in mid stomach, s/p BICAP, path benign   . Esophagogastroduodenoscopy 01/18/2012    Rourk-Erosive reflux esophagitis. Noncritical ring. Antral erosions/ small antral ulcer - appeared innocent  (H pylori serrology negative)  . Colonoscopy 03/09/2012    Procedure: COLONOSCOPY;  Surgeon: West Bali, MD;  Location: [facility];  Service: Endoscopy;  Laterality: N/A;  2:30  . Esophagogastroduodenoscopy 03/09/2012    Procedure: ESOPHAGOGASTRODUODENOSCOPY (EGD);  Surgeon: West Bali, MD;  Location: [facility];  Service: Endoscopy;  Laterality: N/A;    Family History  Problem Relation Age of Onset  . Colon cancer Neg Hx     History  Substance Use Topics  . Smoking status: Former Smoker    Types: Cigarettes    Quit date: 05/07/1969  . Smokeless tobacco: Current User    Types: Snuff  . Alcohol Use: 2.4 oz/week    4 Cans of beer per week     Comment: when ever he wants too      Review of Systems  Respiratory: Positive for cough and shortness of breath.   All other systems reviewed and are negative.    Allergies  Black pepper  Home Medications   Current Outpatient Rx  Name  Route  Sig  Dispense  Refill  . ALBUTEROL SULFATE HFA 108 (90 BASE) MCG/ACT IN AERS   Inhalation  Inhale 2 puffs into the lungs every 6 (six) hours as needed. For shortness of breath         . DIAZEPAM 5 MG PO TABS   Oral   Take 5 mg by mouth 3 (three) times daily.         Marland Kitchen FERROUS SULFATE 325 (65 FE) MG PO TABS   Oral   Take 1 tablet (325 mg total) by mouth daily.   30 tablet   0   . HYDROCODONE-ACETAMINOPHEN 5-325 MG PO TABS   Oral   Take 0.5-1 tablets by mouth 4 (four) times daily as needed. Pain.         Marland Kitchen OMEPRAZOLE 20 MG PO CPDR   Oral   Take 1 capsule (20 mg total) by mouth daily.   30 capsule   0     BP 120/94  Pulse 94  Temp 97.9 F (36.6 C)  Resp 20  SpO2  92%  Physical Exam  Nursing note and vitals reviewed. Constitutional: He is oriented to person, place, and time. He appears well-developed and well-nourished. No distress.  HENT:  Head: Normocephalic and atraumatic.  Eyes: Conjunctivae normal and EOM are normal. Pupils are equal, round, and reactive to light.  Neck: Normal range of motion. Neck supple. No tracheal deviation present.  Cardiovascular: Normal rate, regular rhythm and normal heart sounds.   Pulmonary/Chest: No respiratory distress.       Globally decreased breath sounds, wheezing in upper lobes, right sided chest wall tenderness  Abdominal: Soft. Bowel sounds are normal. He exhibits no distension.  Musculoskeletal: Normal range of motion.  Neurological: He is alert and oriented to person, place, and time. No sensory deficit.  Skin: Skin is warm and dry.  Psychiatric: He has a normal mood and affect. His behavior is normal.    ED Course  Procedures DIAGNOSTIC STUDIES: Oxygen Saturation is 92% on room air, low by my interpretation.    COORDINATION OF CARE: 11:01- Evaluated Pt. Pt is awake, alert, and without distress. 11:07- Patient understand and agree with initial ED impression and plan with expectations set for ED visit. 11:09- Ordered DG Ribs Unilateral W/Chest Right 1 time imaging. 11:15- Ordered predniSONE (DELTASONE) tablet 60 mg, ipratropium (ATROVENT) nebulizer solution 0.5 mg, and albuterol (PROVENTIL) (5 MG/ML) 0.5% nebulizer solution 5 mg Once    Labs Reviewed  CBC WITH DIFFERENTIAL - Abnormal; Notable for the following:    RBC 4.17 (*)     Hemoglobin 11.3 (*)     HCT 36.4 (*)     RDW 25.7 (*)     Platelets 570 (*)     Monocytes Relative 17 (*)     All other components within normal limits  BASIC METABOLIC PANEL - Abnormal; Notable for the following:    Glucose, Bld 101 (*)     GFR calc non Af Amer 81 (*)     All other components within normal limits   Dg Ribs Unilateral W/chest Right  11/24/2012   *RADIOLOGY REPORT*  Clinical Data: Shortness of breath.  Right chest pain.  RIGHT RIBS AND CHEST - 3+ VIEW  Comparison: 10/30/2012  Findings: No fractures or other bone lesions are seen involving the right ribs.  No evidence of pneumothorax or hemothorax.  Hyperinflation is again seen, as with COPD.  Both lungs are clear. Heart size is within normal limits.  No mass or lymphadenopathy identified.  IMPRESSION:  No acute findings.  COPD.   Original Report Authenticated By: Myles Rosenthal, M.D.  1. COPD (chronic obstructive pulmonary disease)       MDM   Patient is here with complaints of shortness of breath and cough that he states woke him up this morning. He states he does not have any more albuterol at home. On exam he is wheezing but has not tachypnea. He denies any infectious symptoms. He is also complaining of right-sided chest pain. Patient was given albuterol, Atrovent and prednisone.  He is not tachycardic or febrile and his resting O2 was 92%. When he was up and walking it dropped to 87%.  Chest x-ray is compatible with COPD. D-dimer was ordered however blood was not which initially drawn and patient refusing to have any more blood drawn.  Feel pe is less likely and pt has had no recent hospitalizations or leg pain/swelling.  1:08 PM O2 sats now in the 90s wheezing is resolved after treatment and pt requesting to be discharge home    I personally performed the services described in this documentation, which was scribed in my presence.  The recorded information has been reviewed and considered.    [name] Sprout, MD 11/24/12 1233  [name] Sprout, MD 11/24/12 1308  [name] Sprout, MD 11/24/12 1310

## 2012-11-24 NOTE — ED Notes (Signed)
Pt c/o sob that became worse this am, states that he has ran out of his inhaler a few days ago, denies any pain,

## 2012-11-29 ENCOUNTER — Emergency Department (HOSPITAL_COMMUNITY)
Admission: EM | Admit: 2012-11-29 | Discharge: 2012-11-29 | Discharge status: Against Medical Advice | Payer: PRIVATE HEALTH INSURANCE | Attending: Emergency Medicine | Admitting: Emergency Medicine

## 2012-11-29 ENCOUNTER — Emergency Department (HOSPITAL_COMMUNITY): Payer: PRIVATE HEALTH INSURANCE

## 2012-11-29 ENCOUNTER — Encounter (HOSPITAL_COMMUNITY): Payer: Self-pay

## 2012-11-29 DIAGNOSIS — J441 Chronic obstructive pulmonary disease with (acute) exacerbation: Secondary | ICD-10-CM | POA: Diagnosis not present

## 2012-11-29 DIAGNOSIS — Z87891 Personal history of nicotine dependence: Secondary | ICD-10-CM | POA: Insufficient documentation

## 2012-11-29 DIAGNOSIS — Z8669 Personal history of other diseases of the nervous system and sense organs: Secondary | ICD-10-CM | POA: Insufficient documentation

## 2012-11-29 DIAGNOSIS — F259 Schizoaffective disorder, unspecified: Secondary | ICD-10-CM | POA: Diagnosis not present

## 2012-11-29 DIAGNOSIS — D649 Anemia, unspecified: Secondary | ICD-10-CM | POA: Diagnosis not present

## 2012-11-29 DIAGNOSIS — R05 Cough: Secondary | ICD-10-CM | POA: Diagnosis not present

## 2012-11-29 DIAGNOSIS — I10 Essential (primary) hypertension: Secondary | ICD-10-CM | POA: Insufficient documentation

## 2012-11-29 DIAGNOSIS — Z79899 Other long term (current) drug therapy: Secondary | ICD-10-CM | POA: Insufficient documentation

## 2012-11-29 DIAGNOSIS — Z8719 Personal history of other diseases of the digestive system: Secondary | ICD-10-CM | POA: Insufficient documentation

## 2012-11-29 DIAGNOSIS — Z8709 Personal history of other diseases of the respiratory system: Secondary | ICD-10-CM | POA: Insufficient documentation

## 2012-11-29 DIAGNOSIS — F411 Generalized anxiety disorder: Secondary | ICD-10-CM | POA: Diagnosis not present

## 2012-11-29 DIAGNOSIS — R059 Cough, unspecified: Secondary | ICD-10-CM | POA: Insufficient documentation

## 2012-11-29 DIAGNOSIS — J449 Chronic obstructive pulmonary disease, unspecified: Secondary | ICD-10-CM

## 2012-11-29 DIAGNOSIS — R0602 Shortness of breath: Secondary | ICD-10-CM | POA: Diagnosis present

## 2012-11-29 DIAGNOSIS — IMO0002 Reserved for concepts with insufficient information to code with codable children: Secondary | ICD-10-CM | POA: Insufficient documentation

## 2012-11-29 MED ORDER — IPRATROPIUM BROMIDE 0.02 % IN SOLN
0.5000 mg | Freq: Once | RESPIRATORY_TRACT | Status: AC
  Administered 2012-11-29: 0.5 mg via RESPIRATORY_TRACT
  Filled 2012-11-29: qty 2.5

## 2012-11-29 MED ORDER — ALBUTEROL SULFATE (5 MG/ML) 0.5% IN NEBU
5.0000 mg | INHALATION_SOLUTION | Freq: Once | RESPIRATORY_TRACT | Status: AC
  Administered 2012-11-29: 5 mg via RESPIRATORY_TRACT
  Filled 2012-11-29: qty 1

## 2012-11-29 MED ORDER — PREDNISONE 50 MG PO TABS
60.0000 mg | ORAL_TABLET | Freq: Once | ORAL | Status: AC
  Administered 2012-11-29: 60 mg via ORAL
  Filled 2012-11-29: qty 1

## 2012-11-29 NOTE — ED Notes (Signed)
Pt reports sob that began a few hours ago.  Pt also reports back pain, and difficulty walking.

## 2012-11-29 NOTE — ED Notes (Signed)
Pt's O2 % was 89 before ambulating. O2 stayed between 87-90 while ambulating throughout ED. Pt states "I felt fine" after returning to room.

## 2012-11-29 NOTE — ED Provider Notes (Signed)
History     CSN: [id number]  Arrival date & time 11/29/12  1443   First MD Initiated Contact with Patient 11/29/12 1513      Chief Complaint  Patient presents with  . Shortness of Breath     HPI Pt was seen at 1540.   Per pt, c/o gradual onset and worsening of persistent cough, wheezing and SOB since last night.  Describes his symptoms as "my COPD is acting up."  Has been using his home MDI with transient relief.  Denies CP/palpitations, no back pain, no abd pain, no N/V/D, no fevers, no rash.  The symptoms have been associated with no other complaints. The patient has a significant history of similar symptoms previously, recently being evaluated for this complaint and multiple prior evals for same.  Pt has been eval in the ED 60 times in the past 6 months for the same complaint, most recent ED visit was 5 days ago.  Pt states he is unsure if he filled the rx for prednisone from that ED visit.     Past Medical History  Diagnosis Date  . COPD (chronic obstructive pulmonary disease)   . Anxiety   . Alcohol abuse   . Anemia   . Generalized headaches   . Hypertension   . 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 Pulmonary nodule/lesion, solitary   . Schizoaffective disorder   . GI bleed     History of recurrent bleeding that dates back as far as 2004 per records,    Past Surgical History  Procedure Date  . Colonoscopy Sept 2009    SLF: few small AVMs in ascending colon distal to IC . Ablated via BICAP.   Marland Kitchen Esophagogastroduodenoscopy Sept 2009    SLF: normal esophagus, small HH, 1-2 AVMs actively oozing in mid stomach, s/p BICAP, path benign   . Esophagogastroduodenoscopy 01/18/2012    Rourk-Erosive reflux esophagitis. Noncritical ring. Antral erosions/ small antral ulcer - appeared innocent  (H pylori serrology negative)  . Colonoscopy 03/09/2012    Procedure: COLONOSCOPY;  Surgeon: West Bali, MD;  Location: [facility];  Service: Endoscopy;  Laterality: N/A;  2:30  . Esophagogastroduodenoscopy 03/09/2012    Procedure: ESOPHAGOGASTRODUODENOSCOPY (EGD);  Surgeon: West Bali, MD;  Location: [facility];  Service: Endoscopy;  Laterality: N/A;    Family History  Problem Relation Age of Onset  . Colon cancer Neg Hx     History  Substance Use Topics  . Smoking status: Former Smoker    Types: Cigarettes    Quit date: 05/07/1969  . Smokeless tobacco: Current User    Types: Snuff  . Alcohol Use: 2.4 oz/week    4 Cans of beer per week     Comment: when ever he wants too     Review of Systems ROS: Statement: All systems negative except as marked or noted in the HPI; Constitutional: Negative for fever and chills. ; ; Eyes: Negative for eye pain, redness and discharge. ; ; ENMT: Negative for ear pain, hoarseness, nasal congestion, sinus pressure and sore throat. ; ; Cardiovascular: Negative for chest pain, palpitations, diaphoresis, and peripheral edema. ; ; Respiratory: +cough, wheezing, SOB. Negative for stridor. ; ; Gastrointestinal: Negative for nausea, vomiting, diarrhea, abdominal pain, blood in stool, hematemesis, jaundice and rectal bleeding. . ; ; Genitourinary: Negative for dysuria, flank pain and hematuria. ; ; Musculoskeletal: Negative for back pain and  neck pain. Negative for swelling and trauma.; ; Skin: Negative for pruritus, rash, abrasions, blisters, bruising and skin lesion.; ; Neuro: Negative for headache, lightheadedness and neck stiffness. Negative for weakness, altered level of consciousness , altered mental status, extremity weakness, paresthesias, involuntary movement, seizure and syncope.        Allergies  Black pepper  Home Medications   Current Outpatient Rx  Name  Route  Sig  Dispense  Refill  . ALBUTEROL SULFATE HFA 108 (90 BASE) MCG/ACT IN AERS   Inhalation   Inhale 2 puffs into the lungs every 6 (six) hours as needed. For shortness of breath         . DIAZEPAM  5 MG PO TABS   Oral   Take 5 mg by mouth 3 (three) times daily.         Marland Kitchen FERROUS SULFATE 325 (65 FE) MG PO TABS   Oral   Take 1 tablet (325 mg total) by mouth daily.   30 tablet   0   . HYDROCODONE-ACETAMINOPHEN 5-325 MG PO TABS   Oral   Take 0.5-1 tablets by mouth 4 (four) times daily as needed. Pain.         Marland Kitchen OMEPRAZOLE 20 MG PO CPDR   Oral   Take 1 capsule (20 mg total) by mouth daily.   30 capsule   0   . PREDNISONE 20 MG PO TABS   Oral   Take 2 tablets (40 mg total) by mouth daily.   10 tablet   0     BP 139/73  Pulse 80  Temp 97.5 F (36.4 C) (Oral)  Resp 16  SpO2 95%  Physical Exam 1545: Physical examination:  Nursing notes reviewed; Vital signs and O2 SAT reviewed;  Constitutional: Well developed, Well nourished, Well hydrated, In no acute distress; Head:  Normocephalic, atraumatic; Eyes: EOMI, PERRL, No scleral icterus.; ENMT: Mouth and pharynx normal, Mucous membranes moist; Neck: Supple, Full range of motion, No lymphadenopathy; Cardiovascular: Regular rate and rhythm, No gallop; Respiratory: Breath sounds diminished & equal bilaterally, faint scattered wheezes.  Speaking full sentences with ease, Normal respiratory effort/excursion; Chest: Nontender, Movement normal; Abdomen: Soft, Nontender, Nondistended, Normal bowel sounds; Genitourinary: No CVA tenderness; Extremities: Pulses normal, No tenderness, No edema, No calf edema or asymmetry.; Neuro: AA&Ox3, Major CN grossly intact.  Speech clear. Climbs on and off stretcher by himself without distress. Gait steady. No gross focal motor or sensory deficits in extremities.; Skin: Color normal, Warm, Dry.    ED Course  Procedures   1555:  Pt well known to the ED for same complaint.  Pt's pharmacy called:  States pt filled the prednisone rx.  Pt states he "doesn't know" if he took his prednisone today.  Refuses labs draw, stating "I'm not letting you do that."  Will check CXR, dose prednisone and give neb tx.   Will ambulate with pulse ox as pt's O2 Sat dropped when ambulated previous ED visit.   1715:  Pt has received neb and prednisone.  Walked around ED with ED Tech, Sats 87-90% R/A.  Denied SOB/CP.  Pt has now gotten himself dressed and is walking around the ED asking "for something to eat and then I'm going." Refuses another neb treatment or admission.  Appears NAD, gait upright and steady, resps easy, talking in full sentences without distress, occasionally yelling at ED staff per his usual ED behavior per EPIC chart review.  Pt continues to refuse to stay for any further labs, neb  tx, or admission, despite recommendations and encouragement by myself and multiple ED staff to do so.  Yelling "just gimme my food and I'm going!" Pt makes his own medical decisions.  Will leave AMA.  Pt encouraged to take his prednisone, continue his MDI q4h, f/u with his PMD or return to the ED if worsening.  Verb understanding.     MDM  MDM Reviewed: nursing note, previous chart and vitals Reviewed previous: x-ray and labs Interpretation: x-ray     Results for orders placed during the hospital encounter of 11/24/12  CBC WITH DIFFERENTIAL      Component Value Range   WBC 5.2  4.0 - 10.5 K/uL   RBC 4.17 (*) 4.22 - 5.81 MIL/uL   Hemoglobin 11.3 (*) 13.0 - 17.0 g/dL   HCT 09.8 (*) 11.9 - 14.7 %   MCV 87.3  78.0 - 100.0 fL   MCH 27.1  26.0 - 34.0 pg   MCHC 31.0  30.0 - 36.0 g/dL   RDW 82.9 (*) 56.2 - 13.0 %   Platelets 570 (*) 150 - 400 K/uL   Neutrophils Relative 64  43 - 77 %   Neutro Abs 3.3  1.7 - 7.7 K/uL   Lymphocytes Relative 17  12 - 46 %   Lymphs Abs 0.9  0.7 - 4.0 K/uL   Monocytes Relative 17 (*) 3 - 12 %   Monocytes Absolute 0.9  0.1 - 1.0 K/uL   Eosinophils Relative 2  0 - 5 %   Eosinophils Absolute 0.1  0.0 - 0.7 K/uL   Basophils Relative 0  0 - 1 %   Basophils Absolute 0.0  0.0 - 0.1 K/uL  BASIC METABOLIC PANEL      Component Value Range   Sodium 139  135 - 145 mEq/L   Potassium 3.7  3.5 -  5.1 mEq/L   Chloride 103  96 - 112 mEq/L   CO2 28  19 - 32 mEq/L   Glucose, Bld 101 (*) 70 - 99 mg/dL   BUN 9  6 - 23 mg/dL   Creatinine, Ser 8.65  0.50 - 1.35 mg/dL   Calcium 8.9  8.4 - 78.4 mg/dL   GFR calc non Af Amer 81 (*) >90 mL/min   GFR calc Af Amer >90  >90 mL/min   Dg Ribs Unilateral W/chest Right 11/24/2012  *RADIOLOGY REPORT*  Clinical Data: Shortness of breath.  Right chest pain.  RIGHT RIBS AND CHEST - 3+ VIEW  Comparison: 10/30/2012  Findings: No fractures or other bone lesions are seen involving the right ribs.  No evidence of pneumothorax or hemothorax.  Hyperinflation is again seen, as with COPD.  Both lungs are clear. Heart size is within normal limits.  No mass or lymphadenopathy identified.  IMPRESSION:  No acute findings.  COPD.   Original Report Authenticated By: Myles Rosenthal, M.D.    Dg Chest 2 View 11/29/2012  *RADIOLOGY REPORT*  Clinical Data: Cough, weakness.  CHEST - 2 VIEW  Comparison: 11/24/2012  Findings: Linear densities anteriorly on the lateral view, likely lingular scarring.  Hyperinflation of the lungs.  Heart is normal size.  No effusion.  No acute bony abnormality.  IMPRESSION: Hyperinflation.  Probable lingular scarring.   Original Report Authenticated By: Charlett Nose, M.D.           Laray Anger, DO 12/02/12 1921

## 2012-11-29 NOTE — ED Notes (Signed)
Pt c/o difficulty breathing and headache since last night. Pt also reports tightness in his chest as well as cough which he states is non-productive.

## 2012-12-08 ENCOUNTER — Encounter (HOSPITAL_COMMUNITY): Payer: Self-pay | Admitting: Emergency Medicine

## 2012-12-08 ENCOUNTER — Emergency Department (HOSPITAL_COMMUNITY)
Admission: EM | Admit: 2012-12-08 | Discharge: 2012-12-08 | Disposition: A | Discharge status: Home / Self Care | Payer: PRIVATE HEALTH INSURANCE | Attending: Emergency Medicine | Admitting: Emergency Medicine

## 2012-12-08 DIAGNOSIS — Z8719 Personal history of other diseases of the digestive system: Secondary | ICD-10-CM | POA: Insufficient documentation

## 2012-12-08 DIAGNOSIS — J441 Chronic obstructive pulmonary disease with (acute) exacerbation: Secondary | ICD-10-CM | POA: Insufficient documentation

## 2012-12-08 DIAGNOSIS — Z79899 Other long term (current) drug therapy: Secondary | ICD-10-CM | POA: Insufficient documentation

## 2012-12-08 DIAGNOSIS — Z8709 Personal history of other diseases of the respiratory system: Secondary | ICD-10-CM | POA: Insufficient documentation

## 2012-12-08 DIAGNOSIS — D649 Anemia, unspecified: Secondary | ICD-10-CM | POA: Insufficient documentation

## 2012-12-08 DIAGNOSIS — F411 Generalized anxiety disorder: Secondary | ICD-10-CM | POA: Insufficient documentation

## 2012-12-08 DIAGNOSIS — F259 Schizoaffective disorder, unspecified: Secondary | ICD-10-CM | POA: Insufficient documentation

## 2012-12-08 DIAGNOSIS — M545 Low back pain, unspecified: Secondary | ICD-10-CM | POA: Insufficient documentation

## 2012-12-08 DIAGNOSIS — Z87738 Personal history of other specified (corrected) congenital malformations of digestive system: Secondary | ICD-10-CM | POA: Insufficient documentation

## 2012-12-08 DIAGNOSIS — R0600 Dyspnea, unspecified: Secondary | ICD-10-CM

## 2012-12-08 DIAGNOSIS — F1010 Alcohol abuse, uncomplicated: Secondary | ICD-10-CM | POA: Insufficient documentation

## 2012-12-08 DIAGNOSIS — I10 Essential (primary) hypertension: Secondary | ICD-10-CM | POA: Insufficient documentation

## 2012-12-08 DIAGNOSIS — Z87891 Personal history of nicotine dependence: Secondary | ICD-10-CM | POA: Insufficient documentation

## 2012-12-08 NOTE — ED Notes (Signed)
Patient reports started having shortness of breath approximately one hour ago. Reports drank a beer in attempts to help. States, "I need something to help me breathe."

## 2012-12-08 NOTE — ED Provider Notes (Signed)
History  Scribed for Joya Gaskins, MD, the patient was seen in room APA07/APA07. This chart was scribed by Candelaria Stagers. The patient's care started at 7:37 PM   CSN: [id number]  Arrival date & time 12/08/12  0865   First MD Initiated Contact with Patient 12/08/12 1915      Chief Complaint  Patient presents with  . Shortness of Breath     The history is provided by the patient. No language interpreter was used.   [name] is a 79 y.o. male who presents to the Emergency Department complaining of SOB that started about one hour ago.  Pt contributes the SOB to something he ate.  He is also experiencing right lower back pain but no injury reported.  Pt denies falling.  He denies chest pain or leg swelling.  Nothing makes the sx better or worse.  Pt has h/o COPD.    Past Medical History  Diagnosis Date  . COPD (chronic obstructive pulmonary disease)   . Anxiety   . Alcohol abuse   . Anemia   . Generalized headaches   . Hypertension   . 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 Pulmonary nodule/lesion, solitary   . Schizoaffective disorder   . GI bleed     History of recurrent bleeding that dates back as far as 2004 per records,    Past Surgical History  Procedure Laterality Date  . Colonoscopy  Sept 2009    SLF: few small AVMs in ascending colon distal to IC . Ablated via BICAP.   Marland Kitchen Esophagogastroduodenoscopy  Sept 2009    SLF: normal esophagus, small HH, 1-2 AVMs actively oozing in mid stomach, s/p BICAP, path benign   . Esophagogastroduodenoscopy  01/18/2012    Rourk-Erosive reflux esophagitis. Noncritical ring. Antral erosions/ small antral ulcer - appeared innocent  (H pylori serrology negative)  . Colonoscopy  03/09/2012    Procedure: COLONOSCOPY;  Surgeon: West Bali, MD;  Location: [facility];  Service: Endoscopy;  Laterality: N/A;  2:30  . Esophagogastroduodenoscopy  03/09/2012    Procedure:  ESOPHAGOGASTRODUODENOSCOPY (EGD);  Surgeon: West Bali, MD;  Location: [facility];  Service: Endoscopy;  Laterality: N/A;    Family History  Problem Relation Age of Onset  . Colon cancer Neg Hx     History  Substance Use Topics  . Smoking status: Former Smoker    Types: Cigarettes    Quit date: 05/07/1969  . Smokeless tobacco: Current User    Types: Snuff  . Alcohol Use: 2.4 oz/week    4 Cans of beer per week     Comment: when ever he wants too      Review of Systems  Respiratory: Positive for shortness of breath. Negative for cough.   Cardiovascular: Negative for chest pain and leg swelling.    Allergies  Black pepper  Home Medications   Current Outpatient Rx  Name  Route  Sig  Dispense  Refill  . albuterol (PROAIR HFA) 108 (90 BASE) MCG/ACT inhaler   Inhalation   Inhale 2 puffs into the lungs every 6 (six) hours as needed. For shortness of breath         . diazepam (VALIUM) 5 MG tablet   Oral   Take 5 mg by mouth 3 (three) times daily.         . ferrous sulfate 325 (65 FE) MG tablet  Oral   Take 1 tablet (325 mg total) by mouth daily.   30 tablet   0   . HYDROcodone-acetaminophen (NORCO/VICODIN) 5-325 MG per tablet   Oral   Take 0.5-1 tablets by mouth 4 (four) times daily as needed. Pain.         Marland Kitchen omeprazole (PRILOSEC) 20 MG capsule   Oral   Take 1 capsule (20 mg total) by mouth daily.   30 capsule   0   . predniSONE (DELTASONE) 20 MG tablet   Oral   Take 2 tablets (40 mg total) by mouth daily.   10 tablet   0     BP 139/80  Pulse 92  Temp(Src) 98 F (36.7 C) (Oral)  Resp 24  SpO2 96%  Physical Exam CONSTITUTIONAL: Well developed/well nourished HEAD AND FACE: Normocephalic/atraumatic EYES: EOMI ENMT: Mucous membranes moist NECK: supple no meningeal signs SPINE:entire spine nontender CV: S1/S2 noted, no murmurs/rubs/gallops noted Chest - tender to right lower posterior chest wall.  No bruising or crepitance LUNGS: Lungs  are clear to auscultation bilaterally, no apparent distress ABDOMEN: soft, nontender, no rebound or guarding GU:no cva tenderness NEURO: Pt is awake/alert, moves all extremitiesx4 EXTREMITIES: pulses normal, full ROM, no edema noted SKIN: warm, color normal PSYCH: no abnormalities of mood noted  ED Course  Procedures   DIAGNOSTIC STUDIES: Oxygen Saturation is 96% on room air, normal by my interpretation.    COORDINATION OF CARE:  Pt well known to the ED here for SOB.  He usually has wheeze but none noted on my exam.  He is in no distress, resting comfortably.  He denies CP, I doubt ACS at this time.  Advised f/u with his PCP.  He has some posterior chest wall pain but no signs of trauma.      MDM  Nursing notes including past medical history and social history reviewed and considered in documentation  I personally performed the services described in this documentation, which was scribed in my presence. The recorded information has been reviewed and is accurate.           Joya Gaskins, MD 12/08/12 2006

## 2012-12-08 NOTE — ED Notes (Signed)
Pt argumentative, will not stay in his room, demanding inhaler.  Pt cussing, shouting in hallway.  [employer] officer with pt, walked out to front waiting area.

## 2012-12-08 NOTE — ED Notes (Signed)
Patient also reports soreness to right lower back intermittently for approximately 2-3 weeks.

## 2012-12-10 ENCOUNTER — Emergency Department (HOSPITAL_COMMUNITY)
Admission: EM | Admit: 2012-12-10 | Discharge: 2012-12-10 | Disposition: A | Discharge status: Home / Self Care | Payer: PRIVATE HEALTH INSURANCE | Attending: Emergency Medicine | Admitting: Emergency Medicine

## 2012-12-10 ENCOUNTER — Emergency Department (HOSPITAL_COMMUNITY): Payer: PRIVATE HEALTH INSURANCE

## 2012-12-10 ENCOUNTER — Encounter (HOSPITAL_COMMUNITY): Payer: Self-pay | Admitting: *Deleted

## 2012-12-10 DIAGNOSIS — D649 Anemia, unspecified: Secondary | ICD-10-CM | POA: Insufficient documentation

## 2012-12-10 DIAGNOSIS — J441 Chronic obstructive pulmonary disease with (acute) exacerbation: Secondary | ICD-10-CM

## 2012-12-10 DIAGNOSIS — R05 Cough: Secondary | ICD-10-CM | POA: Insufficient documentation

## 2012-12-10 DIAGNOSIS — Z8659 Personal history of other mental and behavioral disorders: Secondary | ICD-10-CM | POA: Insufficient documentation

## 2012-12-10 DIAGNOSIS — Z79899 Other long term (current) drug therapy: Secondary | ICD-10-CM | POA: Insufficient documentation

## 2012-12-10 DIAGNOSIS — Z8709 Personal history of other diseases of the respiratory system: Secondary | ICD-10-CM | POA: Insufficient documentation

## 2012-12-10 DIAGNOSIS — I10 Essential (primary) hypertension: Secondary | ICD-10-CM | POA: Insufficient documentation

## 2012-12-10 DIAGNOSIS — Z87891 Personal history of nicotine dependence: Secondary | ICD-10-CM | POA: Insufficient documentation

## 2012-12-10 DIAGNOSIS — Z8719 Personal history of other diseases of the digestive system: Secondary | ICD-10-CM | POA: Insufficient documentation

## 2012-12-10 DIAGNOSIS — R059 Cough, unspecified: Secondary | ICD-10-CM | POA: Insufficient documentation

## 2012-12-10 DIAGNOSIS — F411 Generalized anxiety disorder: Secondary | ICD-10-CM | POA: Insufficient documentation

## 2012-12-10 MED ORDER — PREDNISONE 20 MG PO TABS: ORAL_TABLET | ORAL | Status: DC

## 2012-12-10 MED ORDER — ALBUTEROL SULFATE HFA 108 (90 BASE) MCG/ACT IN AERS
2.0000 | INHALATION_SPRAY | Freq: Once | RESPIRATORY_TRACT | Status: AC
  Administered 2012-12-10: 2 via RESPIRATORY_TRACT
  Filled 2012-12-10: qty 6.7

## 2012-12-10 MED ORDER — ALBUTEROL SULFATE (5 MG/ML) 0.5% IN NEBU
2.5000 mg | INHALATION_SOLUTION | Freq: Once | RESPIRATORY_TRACT | Status: AC
  Administered 2012-12-10: 2.5 mg via RESPIRATORY_TRACT
  Filled 2012-12-10: qty 1

## 2012-12-10 MED ORDER — IPRATROPIUM BROMIDE 0.02 % IN SOLN
0.5000 mg | Freq: Once | RESPIRATORY_TRACT | Status: AC
  Administered 2012-12-10: 0.5 mg via RESPIRATORY_TRACT
  Filled 2012-12-10: qty 2.5
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
## 2012-12-10 NOTE — ED Notes (Signed)
Sob x 1 hour

## 2012-12-10 NOTE — ED Provider Notes (Signed)
History     CSN: [id number]  Arrival date & time 12/10/12  1717   First MD Initiated Contact with Patient 12/10/12 1726      Chief Complaint  Patient presents with  . Shortness of Breath    (Consider location/radiation/quality/duration/timing/severity/associated sxs/prior treatment) HPI Comments: Patient comes to the ER for evaluation of difficulty breathing. Patient reports that he has been out of his medication "for a long time". He has had progressively worsening difficulty breathing. He denies chest pain. He has not had any fever. He does endorse cough.  Patient is a 79 y.o. male presenting with shortness of breath.  Shortness of Breath Associated symptoms: cough     Past Medical History  Diagnosis Date  . COPD (chronic obstructive pulmonary disease)   . Anxiety   . Alcohol abuse   . Anemia   . Generalized headaches   . Hypertension   . 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 Pulmonary nodule/lesion, solitary   . Schizoaffective disorder   . GI bleed     History of recurrent bleeding that dates back as far as 2004 per records,    Past Surgical History  Procedure Laterality Date  . Colonoscopy  Sept 2009    SLF: few small AVMs in ascending colon distal to IC . Ablated via BICAP.   Marland Kitchen Esophagogastroduodenoscopy  Sept 2009    SLF: normal esophagus, small HH, 1-2 AVMs actively oozing in mid stomach, s/p BICAP, path benign   . Esophagogastroduodenoscopy  01/18/2012    Rourk-Erosive reflux esophagitis. Noncritical ring. Antral erosions/ small antral ulcer - appeared innocent  (H pylori serrology negative)  . Colonoscopy  03/09/2012    Procedure: COLONOSCOPY;  Surgeon: West Bali, MD;  Location: [facility];  Service: Endoscopy;  Laterality: N/A;  2:30  . Esophagogastroduodenoscopy  03/09/2012    Procedure: ESOPHAGOGASTRODUODENOSCOPY (EGD);  Surgeon: West Bali, MD;  Location: [facility];  Service:  Endoscopy;  Laterality: N/A;    Family History  Problem Relation Age of Onset  . Colon cancer Neg Hx     History  Substance Use Topics  . Smoking status: Former Smoker    Types: Cigarettes    Quit date: 05/07/1969  . Smokeless tobacco: Current User    Types: Snuff  . Alcohol Use: 2.4 oz/week    4 Cans of beer per week     Comment: when ever he wants too      Review of Systems  Respiratory: Positive for cough and shortness of breath.   All other systems reviewed and are negative.    Allergies  Black pepper  Home Medications   Current Outpatient Rx  Name  Route  Sig  Dispense  Refill  . albuterol (PROAIR HFA) 108 (90 BASE) MCG/ACT inhaler   Inhalation   Inhale 2 puffs into the lungs every 6 (six) hours as needed. For shortness of breath         . diazepam (VALIUM) 5 MG tablet   Oral   Take 5 mg by mouth 3 (three) times daily.         . ferrous sulfate 325 (65 FE) MG tablet   Oral   Take 1 tablet (325 mg total) by mouth daily.   30 tablet   0   . HYDROcodone-acetaminophen (NORCO/VICODIN) 5-325 MG per tablet   Oral   Take 0.5-1 tablets by mouth 4 (four) times  daily as needed. Pain.         Marland Kitchen omeprazole (PRILOSEC) 20 MG capsule   Oral   Take 1 capsule (20 mg total) by mouth daily.   30 capsule   0     BP 135/90  Pulse 95  Temp(Src) 97.3 F (36.3 C) (Oral)  Resp 28  SpO2 96%  Physical Exam  Constitutional: He is oriented to person, place, and time. He appears well-developed and well-nourished. No distress.  HENT:  Head: Normocephalic and atraumatic.  Right Ear: Hearing normal.  Nose: Nose normal.  Mouth/Throat: Oropharynx is clear and moist and mucous membranes are normal.  Eyes: Conjunctivae and EOM are normal. Pupils are equal, round, and reactive to light.  Neck: Normal range of motion. Neck supple.  Cardiovascular: Normal rate, regular rhythm, S1 normal and S2 normal.  Exam reveals no gallop and no friction rub.   No murmur  heard. Pulmonary/Chest: Tachypnea noted. No respiratory distress. He has decreased breath sounds in the right lower field and the left lower field. He has wheezes in the right lower field and the left lower field. He exhibits no tenderness.  Abdominal: Soft. Normal appearance and bowel sounds are normal. There is no hepatosplenomegaly. There is no tenderness. There is no rebound, no guarding, no tenderness at McBurney's point and negative Murphy's sign. No hernia.  Musculoskeletal: Normal range of motion.  Neurological: He is alert and oriented to person, place, and time. He has normal strength. No cranial nerve deficit or sensory deficit. Coordination normal. GCS eye subscore is 4. GCS verbal subscore is 5. GCS motor subscore is 6.  Skin: Skin is warm, dry and intact. No rash noted. No cyanosis.  Psychiatric: He has a normal mood and affect. His speech is normal and behavior is normal. Thought content normal.    ED Course  Procedures (including critical care time)  Labs Reviewed - No data to display Dg Chest 2 View  12/10/2012  *RADIOLOGY REPORT*  Clinical Data: Shortness of breath.  CHEST - 2 VIEW  Comparison: Chest x-ray 11/29/2012.  Findings: Lung volumes are normal.  No consolidative airspace disease.  No pleural effusions.  No definite suspicious appearing pulmonary nodules or masses are identified.  Lingular scarring is unchanged.  No evidence of pulmonary edema.  Heart size is normal. Atherosclerosis in the thoracic aorta.  IMPRESSION: 1.  No radiographic evidence of acute cardiopulmonary disease. 2.  Atherosclerosis.   Original Report Authenticated By: Trudie Reed, M.D.      Diagnosis: COPD    MDM  Patient presents to ER with complaints of increased difficulty breathing. Patient reports that he is out of his albuterol inhaler. He does have a history of COPD. Patient had adequate air movement bilaterally with good oxygenation, but slightly increased respiratory arrival. He was  given a DuoNeb with some improvement. Chest x-ray was negative. Patient is not in any significant distress at this time. He does not wish to stay here any longer for any further workup and it is felt that the patient can be managed as an outpatient with albuterol, follow up with primary physician. Return to the ER if his symptoms worsen.        Gilda Crease, MD 12/10/12 [phone]

## 2012-12-22 ENCOUNTER — Encounter (HOSPITAL_COMMUNITY): Payer: Self-pay | Admitting: *Deleted

## 2012-12-22 ENCOUNTER — Emergency Department (HOSPITAL_COMMUNITY): Payer: PRIVATE HEALTH INSURANCE

## 2012-12-22 ENCOUNTER — Emergency Department (HOSPITAL_COMMUNITY)
Admission: EM | Admit: 2012-12-22 | Discharge: 2012-12-22 | Discharge status: Against Medical Advice | Payer: PRIVATE HEALTH INSURANCE | Attending: Emergency Medicine | Admitting: Emergency Medicine

## 2012-12-22 DIAGNOSIS — R059 Cough, unspecified: Secondary | ICD-10-CM | POA: Insufficient documentation

## 2012-12-22 DIAGNOSIS — F411 Generalized anxiety disorder: Secondary | ICD-10-CM | POA: Insufficient documentation

## 2012-12-22 DIAGNOSIS — J449 Chronic obstructive pulmonary disease, unspecified: Secondary | ICD-10-CM

## 2012-12-22 DIAGNOSIS — R05 Cough: Secondary | ICD-10-CM | POA: Insufficient documentation

## 2012-12-22 DIAGNOSIS — D649 Anemia, unspecified: Secondary | ICD-10-CM | POA: Insufficient documentation

## 2012-12-22 DIAGNOSIS — J441 Chronic obstructive pulmonary disease with (acute) exacerbation: Secondary | ICD-10-CM | POA: Insufficient documentation

## 2012-12-22 DIAGNOSIS — IMO0001 Reserved for inherently not codable concepts without codable children: Secondary | ICD-10-CM | POA: Insufficient documentation

## 2012-12-22 DIAGNOSIS — Z8719 Personal history of other diseases of the digestive system: Secondary | ICD-10-CM | POA: Insufficient documentation

## 2012-12-22 DIAGNOSIS — Z8709 Personal history of other diseases of the respiratory system: Secondary | ICD-10-CM | POA: Insufficient documentation

## 2012-12-22 DIAGNOSIS — Z79899 Other long term (current) drug therapy: Secondary | ICD-10-CM | POA: Insufficient documentation

## 2012-12-22 DIAGNOSIS — Z87891 Personal history of nicotine dependence: Secondary | ICD-10-CM | POA: Insufficient documentation

## 2012-12-22 DIAGNOSIS — I10 Essential (primary) hypertension: Secondary | ICD-10-CM | POA: Insufficient documentation

## 2012-12-22 LAB — POCT I-STAT, CHEM 8
Calcium, Ion: 1.17 mmol/L (ref 1.13–1.30)
Creatinine, Ser: 1.1 mg/dL (ref 0.50–1.35)
Glucose, Bld: 95 mg/dL (ref 70–99)
Hemoglobin: 11.9 g/dL — ABNORMAL LOW (ref 13.0–17.0)
Potassium: 3.9 mEq/L (ref 3.5–5.1)
TCO2: 26 mmol/L (ref 0–100)

## 2012-12-22 MED ORDER — ALBUTEROL SULFATE HFA 108 (90 BASE) MCG/ACT IN AERS
2.0000 | INHALATION_SPRAY | RESPIRATORY_TRACT | Status: DC | PRN
  Administered 2012-12-22: 2 via RESPIRATORY_TRACT
  Filled 2012-12-22: qty 6.7

## 2012-12-22 NOTE — ED Notes (Signed)
PT left ama but refused to sign.  Pt got loud at the nurses station.   Pt walked out of the department.

## 2012-12-22 NOTE — ED Notes (Signed)
Pt presents to er with c/o sob, pt agitated in triage, security at triage with pt, pt states that his sob increased today, admits to one beer prior to coming to er. Has used inhalers without any relief.

## 2012-12-22 NOTE — ED Provider Notes (Signed)
History     CSN: [id number]  Arrival date & time 12/22/12  1357   First MD Initiated Contact with Patient 12/22/12 1414      Chief Complaint  Patient presents with  . Shortness of Breath    (Consider location/radiation/quality/duration/timing/severity/associated sxs/prior treatment) HPI Comments: Patient agitated and belligerent, well known to ED for frequent visits. Complains of shortness of breath for the past one hour. Drink one beer to see if it would help. Denies any chest pain, fever, abdominal pain nausea or vomiting. States his inhaler at home without relief. Denies any leg pain or swelling. Complains of pain to left great toe since he was in jail last week.  The history is provided by the patient. The history is limited by the condition of the patient.    Past Medical History  Diagnosis Date  . COPD (chronic obstructive pulmonary disease)   . Anxiety   . Alcohol abuse   . Anemia   . Generalized headaches   . Hypertension   . 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 Pulmonary nodule/lesion, solitary   . Schizoaffective disorder   . GI bleed     History of recurrent bleeding that dates back as far as 2004 per records,    Past Surgical History  Procedure Laterality Date  . Colonoscopy  Sept 2009    SLF: few small AVMs in ascending colon distal to IC . Ablated via BICAP.   Marland Kitchen Esophagogastroduodenoscopy  Sept 2009    SLF: normal esophagus, small HH, 1-2 AVMs actively oozing in mid stomach, s/p BICAP, path benign   . Esophagogastroduodenoscopy  01/18/2012    Rourk-Erosive reflux esophagitis. Noncritical ring. Antral erosions/ small antral ulcer - appeared innocent  (H pylori serrology negative)  . Colonoscopy  03/09/2012    Procedure: COLONOSCOPY;  Surgeon: West Bali, MD;  Location: [facility];  Service: Endoscopy;  Laterality: N/A;  2:30  . Esophagogastroduodenoscopy  03/09/2012    Procedure:  ESOPHAGOGASTRODUODENOSCOPY (EGD);  Surgeon: West Bali, MD;  Location: [facility];  Service: Endoscopy;  Laterality: N/A;    Family History  Problem Relation Age of Onset  . Colon cancer Neg Hx     History  Substance Use Topics  . Smoking status: Former Smoker    Types: Cigarettes    Quit date: 05/07/1969  . Smokeless tobacco: Current User    Types: Snuff  . Alcohol Use: 2.4 oz/week    4 Cans of beer per week     Comment: when ever he wants too      Review of Systems  Constitutional: Negative for appetite change.  HENT: Negative for congestion and rhinorrhea.   Respiratory: Positive for cough and shortness of breath. Negative for chest tightness.   Cardiovascular: Negative for chest pain.  Gastrointestinal: Negative for nausea, vomiting and abdominal pain.  Genitourinary: Negative for dysuria and hematuria.  Musculoskeletal: Positive for myalgias.  Skin: Negative for rash.  Neurological: Negative for weakness.  A complete 10 system review of systems was obtained and all systems are negative except as noted in the HPI and PMH.    Allergies  Black pepper  Home Medications   Current Outpatient Rx  Name  Route  Sig  Dispense  Refill  . albuterol (PROAIR HFA) 108 (90 BASE) MCG/ACT inhaler   Inhalation   Inhale 2 puffs into the lungs every 6 (six) hours as needed. For  shortness of breath         . diazepam (VALIUM) 5 MG tablet   Oral   Take 5 mg by mouth 3 (three) times daily.         Marland Kitchen HYDROcodone-acetaminophen (NORCO/VICODIN) 5-325 MG per tablet   Oral   Take 0.5-1 tablets by mouth 4 (four) times daily as needed. Pain.         . Ipratropium-Albuterol (COMBIVENT RESPIMAT) 20-100 MCG/ACT AERS respimat   Inhalation   Inhale 1 puff into the lungs every 6 (six) hours as needed for wheezing or shortness of breath.         . ferrous sulfate 325 (65 FE) MG tablet   Oral   Take 1 tablet (325 mg total) by mouth daily.   30 tablet   0   . omeprazole  (PRILOSEC) 20 MG capsule   Oral   Take 1 capsule (20 mg total) by mouth daily.   30 capsule   0     BP 100/61  Pulse 91  Temp(Src) 98.3 F (36.8 C)  Resp 24  SpO2 89%  Physical Exam  Constitutional: He is oriented to person, place, and time. He appears well-developed and well-nourished. No distress.  Agitated  HENT:  Head: Normocephalic and atraumatic.  Mouth/Throat: Oropharynx is clear and moist. No oropharyngeal exudate.  Eyes: Conjunctivae are normal. Pupils are equal, round, and reactive to light.  Neck: Normal range of motion. Neck supple.  Cardiovascular: Normal rate, regular rhythm and normal heart sounds.   No murmur heard. Pulmonary/Chest: Effort normal and breath sounds normal. No respiratory distress. He has no wheezes.  Decreased breath sounds bilaterally without wheezing  Abdominal: Soft. There is no tenderness. There is no rebound and no guarding.  Musculoskeletal: Normal range of motion. He exhibits no edema and no tenderness.  TTP L great toe. No deformity. +2 DP and PT pulses  Neurological: He is alert and oriented to person, place, and time. No cranial nerve deficit. He exhibits normal muscle tone. Coordination normal.  Skin: Skin is warm.    ED Course  Procedures (including critical care time)  Labs Reviewed  POCT I-STAT, CHEM 8 - Abnormal; Notable for the following:    Hemoglobin 11.9 (*)    HCT 35.0 (*)    All other components within normal limits   No results found.   1. COPD (chronic obstructive pulmonary disease)       MDM  Acute chronic dyspnea secondary to COPD. Frequent visits for same. Vital stable, no distress, no wheezing.  Ordered albuterol, chest x-ray.  Patient became upset and left ER prior to completion of treatment. He is walking around the ED he speaking in full sentences in stating that he is going home. He is capable of making his own decisions and leave against medical advice.   Date: 12/22/2012  Rate: 81  Rhythm:  normal sinus rhythm  QRS Axis: normal  Intervals: normal  ST/T Wave abnormalities: normal  Conduction Disutrbances:none  Narrative Interpretation:   Old EKG Reviewed: unchanged       Glynn Octave, MD 12/22/12 [phone]

## 2012-12-23 ENCOUNTER — Emergency Department (HOSPITAL_COMMUNITY)
Admission: EM | Admit: 2012-12-23 | Discharge: 2012-12-23 | Disposition: A | Discharge status: Home / Self Care | Payer: PRIVATE HEALTH INSURANCE | Attending: Emergency Medicine | Admitting: Emergency Medicine

## 2012-12-23 ENCOUNTER — Emergency Department (HOSPITAL_COMMUNITY): Payer: PRIVATE HEALTH INSURANCE

## 2012-12-23 ENCOUNTER — Encounter (HOSPITAL_COMMUNITY): Payer: Self-pay

## 2012-12-23 DIAGNOSIS — D649 Anemia, unspecified: Secondary | ICD-10-CM | POA: Insufficient documentation

## 2012-12-23 DIAGNOSIS — I10 Essential (primary) hypertension: Secondary | ICD-10-CM | POA: Insufficient documentation

## 2012-12-23 DIAGNOSIS — Z8719 Personal history of other diseases of the digestive system: Secondary | ICD-10-CM | POA: Insufficient documentation

## 2012-12-23 DIAGNOSIS — Z8659 Personal history of other mental and behavioral disorders: Secondary | ICD-10-CM | POA: Insufficient documentation

## 2012-12-23 DIAGNOSIS — Z8709 Personal history of other diseases of the respiratory system: Secondary | ICD-10-CM | POA: Insufficient documentation

## 2012-12-23 DIAGNOSIS — R05 Cough: Secondary | ICD-10-CM | POA: Insufficient documentation

## 2012-12-23 DIAGNOSIS — J441 Chronic obstructive pulmonary disease with (acute) exacerbation: Secondary | ICD-10-CM | POA: Insufficient documentation

## 2012-12-23 DIAGNOSIS — R059 Cough, unspecified: Secondary | ICD-10-CM | POA: Insufficient documentation

## 2012-12-23 DIAGNOSIS — Z79899 Other long term (current) drug therapy: Secondary | ICD-10-CM | POA: Insufficient documentation

## 2012-12-23 DIAGNOSIS — Z87891 Personal history of nicotine dependence: Secondary | ICD-10-CM | POA: Insufficient documentation

## 2012-12-23 DIAGNOSIS — J449 Chronic obstructive pulmonary disease, unspecified: Secondary | ICD-10-CM

## 2012-12-23 LAB — CBC WITH DIFFERENTIAL/PLATELET
Basophils Absolute: 0 10*3/uL (ref 0.0–0.1)
Eosinophils Absolute: 0.3 10*3/uL (ref 0.0–0.7)
Eosinophils Relative: 7 % — ABNORMAL HIGH (ref 0–5)
Lymphocytes Relative: 19 % (ref 12–46)
MCV: 87 fL (ref 78.0–100.0)
Neutrophils Relative %: 58 % (ref 43–77)
Platelets: 325 10*3/uL (ref 150–400)
RDW: 21.3 % — ABNORMAL HIGH (ref 11.5–15.5)
WBC: 3.5 10*3/uL — ABNORMAL LOW (ref 4.0–10.5)

## 2012-12-23 LAB — TROPONIN I: Troponin I: <0.3 ng/mL (ref <0.30)

## 2012-12-23 LAB — BASIC METABOLIC PANEL
Calcium: 9.5 mg/dL (ref 8.4–10.5)
GFR calc non Af Amer: 80 mL/min — ABNORMAL LOW (ref >90)
Potassium: 4 mEq/L (ref 3.5–5.1)
Sodium: 138 mEq/L (ref 135–145)

## 2012-12-23 MED ORDER — PREDNISONE 50 MG PO TABS: ORAL_TABLET | ORAL | Status: DC

## 2012-12-23 MED ORDER — ALBUTEROL SULFATE HFA 108 (90 BASE) MCG/ACT IN AERS
INHALATION_SPRAY | RESPIRATORY_TRACT | Status: AC
  Administered 2012-12-23: 19:00:00
  Filled 2012-12-23: qty 6.7

## 2012-12-23 MED ORDER — IPRATROPIUM BROMIDE 0.02 % IN SOLN
0.5000 mg | Freq: Once | RESPIRATORY_TRACT | Status: AC
  Administered 2012-12-23: 0.5 mg via RESPIRATORY_TRACT
  Filled 2012-12-23: qty 2.5

## 2012-12-23 MED ORDER — PREDNISONE 50 MG PO TABS
60.0000 mg | ORAL_TABLET | Freq: Once | ORAL | Status: AC
  Administered 2012-12-23: 60 mg via ORAL
  Filled 2012-12-23: qty 1

## 2012-12-23 MED ORDER — ALBUTEROL SULFATE HFA 108 (90 BASE) MCG/ACT IN AERS: 2.0000 | INHALATION_SPRAY | Freq: Once | RESPIRATORY_TRACT | Status: DC

## 2012-12-23 MED ORDER — ALBUTEROL SULFATE (5 MG/ML) 0.5% IN NEBU
2.5000 mg | INHALATION_SOLUTION | Freq: Once | RESPIRATORY_TRACT | Status: AC
  Administered 2012-12-23: 2.5 mg via RESPIRATORY_TRACT
  Filled 2012-12-23: qty 0.5

## 2012-12-23 MED ORDER — ALBUTEROL SULFATE HFA 108 (90 BASE) MCG/ACT IN AERS: 2.0000 | INHALATION_SPRAY | RESPIRATORY_TRACT | Status: DC | PRN

## 2012-12-23 NOTE — ED Notes (Signed)
Security in room with patient. Patient wanded and patients belongings secured by security.

## 2012-12-23 NOTE — ED Notes (Signed)
Pt arrived from home, by ems, stated that he has been having trouble breathing for years and today has gotten worse. Was seen in er yesterday but we would not "treat his black ass right so he left",  Has agreed today to breathing treatment and chest xray, rn gave him sprite to drink and safety socks to wear, he has been wanded by security for safety. Belongings bagged.

## 2012-12-23 NOTE — ED Notes (Signed)
EMS reports pt was walking up a hill, c/o wheezing and SOB and bystander called EMS>  Reports pt left his inhaler at home.  C/O R shoulder pain x 2 days.

## 2012-12-23 NOTE — ED Provider Notes (Signed)
History     CSN: [id number]  Arrival date & time 12/23/12  1752   First MD Initiated Contact with Patient 12/23/12 1757      Chief Complaint  Patient presents with  . Shortness of Breath    (Consider location/radiation/quality/duration/timing/severity/associated sxs/prior treatment) HPI Comments: Patient brought by EMS with wheezing and shortness of breath that started while walking. Frequent visitor to the ED with shortness of breath complaints. Seen yesterday by myself. He left prior to treatment completion. Denies any chest pain but complains of bilateral shoulder pain he said for 3 days constantly. Denies any fever. Denies abdominal pain nausea or vomiting. Denies any leg pain or swelling. Did not use his inhaler because he did not have it.  The history is provided by the patient and the EMS personnel.    Past Medical History  Diagnosis Date  . COPD (chronic obstructive pulmonary disease)   . Anxiety   . Alcohol abuse   . Anemia   . Generalized headaches   . Hypertension   . 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 Pulmonary nodule/lesion, solitary   . Schizoaffective disorder   . GI bleed     History of recurrent bleeding that dates back as far as 2004 per records,    Past Surgical History  Procedure Laterality Date  . Colonoscopy  Sept 2009    SLF: few small AVMs in ascending colon distal to IC . Ablated via BICAP.   Marland Kitchen Esophagogastroduodenoscopy  Sept 2009    SLF: normal esophagus, small HH, 1-2 AVMs actively oozing in mid stomach, s/p BICAP, path benign   . Esophagogastroduodenoscopy  01/18/2012    Rourk-Erosive reflux esophagitis. Noncritical ring. Antral erosions/ small antral ulcer - appeared innocent  (H pylori serrology negative)  . Colonoscopy  03/09/2012    Procedure: COLONOSCOPY;  Surgeon: West Bali, MD;  Location: [facility];  Service: Endoscopy;  Laterality: N/A;  2:30  .  Esophagogastroduodenoscopy  03/09/2012    Procedure: ESOPHAGOGASTRODUODENOSCOPY (EGD);  Surgeon: West Bali, MD;  Location: [facility];  Service: Endoscopy;  Laterality: N/A;    Family History  Problem Relation Age of Onset  . Colon cancer Neg Hx     History  Substance Use Topics  . Smoking status: Former Smoker    Types: Cigarettes    Quit date: 05/07/1969  . Smokeless tobacco: Current User    Types: Snuff  . Alcohol Use: 2.4 oz/week    4 Cans of beer per week     Comment: pt denies      Review of Systems  Constitutional: Negative for fever, activity change and appetite change.  HENT: Negative for congestion and rhinorrhea.   Respiratory: Positive for cough and shortness of breath. Negative for chest tightness.   Cardiovascular: Negative for chest pain and leg swelling.  Gastrointestinal: Negative for nausea, vomiting and abdominal pain.  Genitourinary: Negative for dysuria and hematuria.  Musculoskeletal: Negative for back pain.  Neurological: Negative for dizziness, weakness and headaches.  A complete 10 system review of systems was obtained and all systems are negative except as noted in the HPI and PMH.    Allergies  Black pepper  Home Medications   Current Outpatient Rx  Name  Route  Sig  Dispense  Refill  . albuterol (PROAIR HFA) 108 (90 BASE) MCG/ACT inhaler   Inhalation   Inhale 2 puffs into the lungs every  6 (six) hours as needed. For shortness of breath         . diazepam (VALIUM) 5 MG tablet   Oral   Take 5 mg by mouth 3 (three) times daily.         Marland Kitchen HYDROcodone-acetaminophen (NORCO/VICODIN) 5-325 MG per tablet   Oral   Take 0.5-1 tablets by mouth 4 (four) times daily as needed. Pain.         . Ipratropium-Albuterol (COMBIVENT RESPIMAT) 20-100 MCG/ACT AERS respimat   Inhalation   Inhale 1 puff into the lungs every 6 (six) hours as needed for wheezing or shortness of breath.         Marland Kitchen omeprazole (PRILOSEC) 20 MG capsule   Oral    Take 1 capsule (20 mg total) by mouth daily.   30 capsule   0   . ferrous sulfate 325 (65 FE) MG tablet   Oral   Take 1 tablet (325 mg total) by mouth daily.   30 tablet   0     BP 108/53  Pulse 112  Temp(Src) 97.9 F (36.6 C) (Oral)  Resp 24  Ht 5\' 2"  (1.575 m)  Wt 130 lb (58.968 kg)  BMI 23.77 kg/m2  SpO2 93%  Physical Exam  Constitutional: He appears well-developed and well-nourished. No distress.  HENT:  Head: Normocephalic.  Mouth/Throat: Oropharynx is clear and moist. No oropharyngeal exudate.  Eyes: Conjunctivae and EOM are normal. Pupils are equal, round, and reactive to light.  Neck: Normal range of motion. Neck supple.  Cardiovascular: Normal rate, regular rhythm and normal heart sounds.   No murmur heard. Pulmonary/Chest: Effort normal. No respiratory distress. He has wheezes.  Speaking in full sentences, expiratory wheezing bilaterally  Abdominal: Soft. There is no tenderness. There is no rebound and no guarding.  Musculoskeletal: Normal range of motion. He exhibits no tenderness.  Neurological: He is alert. No cranial nerve deficit. He exhibits normal muscle tone. Coordination normal.  Skin: Skin is warm.    ED Course  Procedures (including critical care time)  Labs Reviewed  CBC WITH DIFFERENTIAL - Abnormal; Notable for the following:    WBC 3.5 (*)    RBC 3.77 (*)    Hemoglobin 10.5 (*)    HCT 32.8 (*)    RDW 21.3 (*)    Monocytes Relative 15 (*)    Eosinophils Relative 7 (*)    All other components within normal limits  BASIC METABOLIC PANEL - Abnormal; Notable for the following:    Glucose, Bld 106 (*)    GFR calc non Af Amer 80 (*)    All other components within normal limits  TROPONIN I   Dg Chest Portable 1 View  12/23/2012  *RADIOLOGY REPORT*  Clinical Data: Shortness of breath  PORTABLE CHEST - 1 VIEW  Comparison: 12/10/2012  Findings: Cardiomediastinal silhouette is stable.  No acute infiltrate or pleural effusion.  No pulmonary edema.   Bony thorax is stable.  IMPRESSION: No active disease.  No significant change.   Original Report Authenticated By: Natasha Mead, M.D.      No diagnosis found.    MDM  Acute and chronic dyspnea secondary to COPD noncompliance. Frequent visits for same. No distress, vitals stable.  Patient given nebulizers, prednisone. Chest x-ray obtained.  No evidence of pneumonia. Significant breathing treatment x2 with improvement in breath sounds are reading. He is ambulatory in the ED maintaining his oxygen saturations at 92%. He speaking in full senses and yelling as per his usual  behavior.     Glynn Octave, MD 12/23/12 2131

## 2012-12-23 NOTE — ED Notes (Signed)
Pt alert & oriented x4, stable gait. Patient given discharge instructions, paperwork & prescription(s). Patient verbalized understanding asking how he was going to get him, pt informed as always he has to [facility] arrangements for his own transportation. Pt left department w/ no further questions.

## 2012-12-24 LAB — POCT I-STAT TROPONIN I: Troponin i, poc: 0.14 ng/mL (ref 0.00–0.08)

## 2012-12-25 ENCOUNTER — Emergency Department (HOSPITAL_COMMUNITY)
Admission: EM | Admit: 2012-12-25 | Discharge: 2012-12-25 | Disposition: A | Discharge status: Home / Self Care | Payer: PRIVATE HEALTH INSURANCE | Attending: Emergency Medicine | Admitting: Emergency Medicine

## 2012-12-25 ENCOUNTER — Encounter (HOSPITAL_COMMUNITY): Payer: Self-pay

## 2012-12-25 DIAGNOSIS — Z8719 Personal history of other diseases of the digestive system: Secondary | ICD-10-CM | POA: Insufficient documentation

## 2012-12-25 DIAGNOSIS — Z79899 Other long term (current) drug therapy: Secondary | ICD-10-CM | POA: Insufficient documentation

## 2012-12-25 DIAGNOSIS — J449 Chronic obstructive pulmonary disease, unspecified: Secondary | ICD-10-CM

## 2012-12-25 DIAGNOSIS — Z8659 Personal history of other mental and behavioral disorders: Secondary | ICD-10-CM | POA: Insufficient documentation

## 2012-12-25 DIAGNOSIS — Z8709 Personal history of other diseases of the respiratory system: Secondary | ICD-10-CM | POA: Insufficient documentation

## 2012-12-25 DIAGNOSIS — D649 Anemia, unspecified: Secondary | ICD-10-CM | POA: Insufficient documentation

## 2012-12-25 DIAGNOSIS — F411 Generalized anxiety disorder: Secondary | ICD-10-CM | POA: Insufficient documentation

## 2012-12-25 DIAGNOSIS — I10 Essential (primary) hypertension: Secondary | ICD-10-CM | POA: Insufficient documentation

## 2012-12-25 DIAGNOSIS — J441 Chronic obstructive pulmonary disease with (acute) exacerbation: Secondary | ICD-10-CM | POA: Insufficient documentation

## 2012-12-25 DIAGNOSIS — Z87891 Personal history of nicotine dependence: Secondary | ICD-10-CM | POA: Insufficient documentation

## 2012-12-25 MED ORDER — ALBUTEROL SULFATE (5 MG/ML) 0.5% IN NEBU
5.0000 mg | INHALATION_SOLUTION | Freq: Once | RESPIRATORY_TRACT | Status: AC
  Administered 2012-12-25: 5 mg via RESPIRATORY_TRACT
  Filled 2012-12-25: qty 1

## 2012-12-25 MED ORDER — IPRATROPIUM BROMIDE 0.02 % IN SOLN
0.5000 mg | Freq: Once | RESPIRATORY_TRACT | Status: AC
  Administered 2012-12-25: 0.5 mg via RESPIRATORY_TRACT
  Filled 2012-12-25: qty 2.5

## 2012-12-25 NOTE — ED Notes (Signed)
Pt with beginning RA sats of 93%, sats decreased to lowest at 89% while ambulating, EDP made aware, sats after ambulating sitting on side of bed sats 94% on RA

## 2012-12-25 NOTE — ED Notes (Signed)
Pt c/o SOB x1 hour.  

## 2012-12-25 NOTE — ED Notes (Signed)
MD at bedside. 

## 2012-12-25 NOTE — ED Provider Notes (Signed)
History     This chart was scribed for Glynn Octave, MD, MD by Smitty Pluck, ED Scribe. The patient was seen in room [facility] and the patient's care was started at 2:05 PM.   CSN: [id number]  Arrival date & time 12/25/12  1339      Chief Complaint  Patient presents with  . Shortness of Breath     HPI [name] is a 79 y.o. male who presents to the Emergency Department complaining of constant, moderate SOB onset today 1 hour ago. Pt was seen in ED 2 days ago for same symptoms, had chest xray that was normal and given inhaler. Pt reports that he used inhaler without relief. Pt denies abdominal pain, fever, chills, nausea, vomiting, diarrhea, weakness, cough and any other pain.   Past Medical History  Diagnosis Date  . COPD (chronic obstructive pulmonary disease)   . Anxiety   . Alcohol abuse   . Anemia   . Generalized headaches   . Hypertension   . 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 Pulmonary nodule/lesion, solitary   . Schizoaffective disorder   . GI bleed     History of recurrent bleeding that dates back as far as 2004 per records,    Past Surgical History  Procedure Laterality Date  . Colonoscopy  Sept 2009    SLF: few small AVMs in ascending colon distal to IC . Ablated via BICAP.   Marland Kitchen Esophagogastroduodenoscopy  Sept 2009    SLF: normal esophagus, small HH, 1-2 AVMs actively oozing in mid stomach, s/p BICAP, path benign   . Esophagogastroduodenoscopy  01/18/2012    Rourk-Erosive reflux esophagitis. Noncritical ring. Antral erosions/ small antral ulcer - appeared innocent  (H pylori serrology negative)  . Colonoscopy  03/09/2012    Procedure: COLONOSCOPY;  Surgeon: West Bali, MD;  Location: [facility];  Service: Endoscopy;  Laterality: N/A;  2:30  . Esophagogastroduodenoscopy  03/09/2012    Procedure: ESOPHAGOGASTRODUODENOSCOPY (EGD);  Surgeon: West Bali, MD;  Location: [facility];   Service: Endoscopy;  Laterality: N/A;    Family History  Problem Relation Age of Onset  . Colon cancer Neg Hx     History  Substance Use Topics  . Smoking status: Former Smoker    Types: Cigarettes    Quit date: 05/07/1969  . Smokeless tobacco: Current User    Types: Snuff  . Alcohol Use: 2.4 oz/week    4 Cans of beer per week     Comment: pt denies      Review of Systems 10 Systems reviewed and all are negative for acute change except as noted in the HPI.   Allergies  Black pepper  Home Medications   Current Outpatient Rx  Name  Route  Sig  Dispense  Refill  . albuterol (PROAIR HFA) 108 (90 BASE) MCG/ACT inhaler   Inhalation   Inhale 2 puffs into the lungs every 6 (six) hours as needed. For shortness of breath         . albuterol (PROVENTIL HFA;VENTOLIN HFA) 108 (90 BASE) MCG/ACT inhaler   Inhalation   Inhale 2 puffs into the lungs every 4 (four) hours as needed for wheezing.   1 Inhaler   0   . diazepam (VALIUM) 5 MG tablet   Oral   Take 5 mg by mouth 3 (three) times daily.         Marland Kitchen  ferrous sulfate 325 (65 FE) MG tablet   Oral   Take 1 tablet (325 mg total) by mouth daily.   30 tablet   0   . HYDROcodone-acetaminophen (NORCO/VICODIN) 5-325 MG per tablet   Oral   Take 0.5-1 tablets by mouth 4 (four) times daily as needed. Pain.         . Ipratropium-Albuterol (COMBIVENT RESPIMAT) 20-100 MCG/ACT AERS respimat   Inhalation   Inhale 1 puff into the lungs every 6 (six) hours as needed for wheezing or shortness of breath.         Marland Kitchen omeprazole (PRILOSEC) 20 MG capsule   Oral   Take 1 capsule (20 mg total) by mouth daily.   30 capsule   0   . predniSONE (DELTASONE) 50 MG tablet      1 tablet PO daily   5 tablet   0     BP 113/97  Pulse 88  Temp(Src) 97.4 F (36.3 C) (Oral)  Resp 20  Wt 130 lb (58.968 kg)  BMI 23.77 kg/m2  SpO2 93%  Physical Exam  Nursing note and vitals reviewed. Constitutional: He is oriented to person, place,  and time. He appears well-developed and well-nourished. No distress.  HENT:  Head: Normocephalic and atraumatic.  Eyes: EOM are normal. Pupils are equal, round, and reactive to light.  Neck: Normal range of motion. Neck supple. No tracheal deviation present.  Cardiovascular: Normal rate.   Pulmonary/Chest: Effort normal. No respiratory distress. He has wheezes (scattered expiratory ).  Speaking in full sentences   Abdominal: Soft. He exhibits no distension.  Musculoskeletal: Normal range of motion.  Neurological: He is alert and oriented to person, place, and time.  Skin: Skin is warm and dry.  Psychiatric: He has a normal mood and affect. His behavior is normal.    ED Course  Procedures (including critical care time) DIAGNOSTIC STUDIES: Oxygen Saturation is 93% on room air, low by my interpretation.    COORDINATION OF CARE: 2:08 PM Discussed ED treatment with pt and pt agrees.  2:12 PM Ordered:  Medications  albuterol (PROVENTIL) (5 MG/ML) 0.5% nebulizer solution 5 mg (not administered)  ipratropium (ATROVENT) nebulizer solution 0.5 mg (not administered)       Labs Reviewed - No data to display Dg Chest Portable 1 View  12/23/2012  *RADIOLOGY REPORT*  Clinical Data: Shortness of breath  PORTABLE CHEST - 1 VIEW  Comparison: 12/10/2012  Findings: Cardiomediastinal silhouette is stable.  No acute infiltrate or pleural effusion.  No pulmonary edema.  Bony thorax is stable.  IMPRESSION: No active disease.  No significant change.   Original Report Authenticated By: Natasha Mead, M.D.      No diagnosis found.    MDM  Acute and chronic dyspnea secondary to COPD noncompliance. Frequent visits for same. No distress, vitals stable.  Seen by myself two days ago and again 3 days ago for the same.  Started on prednisone, doubt compliance. Recent CXR clear.  Appears to be at his baseline, speaking in full sentences, good air movement, scattered wheezing. Ambulatory in the ED without  desaturation.  I personally performed the services described in this documentation, which was scribed in my presence. The recorded information has been reviewed and is accurate.      Glynn Octave, MD 12/25/12 1754

## 2012-12-25 NOTE — ED Notes (Signed)
Pt ambulating out of room without waiting for d/c instructions, pt states coming back for instructions

## 2012-12-26 ENCOUNTER — Encounter (HOSPITAL_COMMUNITY): Payer: Self-pay

## 2012-12-26 ENCOUNTER — Emergency Department (HOSPITAL_COMMUNITY)
Admission: EM | Admit: 2012-12-26 | Discharge: 2012-12-26 | Disposition: A | Discharge status: Home / Self Care | Payer: PRIVATE HEALTH INSURANCE | Attending: Emergency Medicine | Admitting: Emergency Medicine

## 2012-12-26 DIAGNOSIS — Z79899 Other long term (current) drug therapy: Secondary | ICD-10-CM | POA: Insufficient documentation

## 2012-12-26 DIAGNOSIS — Z8709 Personal history of other diseases of the respiratory system: Secondary | ICD-10-CM | POA: Insufficient documentation

## 2012-12-26 DIAGNOSIS — Z8774 Personal history of (corrected) congenital malformations of heart and circulatory system: Secondary | ICD-10-CM | POA: Insufficient documentation

## 2012-12-26 DIAGNOSIS — I10 Essential (primary) hypertension: Secondary | ICD-10-CM | POA: Insufficient documentation

## 2012-12-26 DIAGNOSIS — Z8659 Personal history of other mental and behavioral disorders: Secondary | ICD-10-CM | POA: Insufficient documentation

## 2012-12-26 DIAGNOSIS — F411 Generalized anxiety disorder: Secondary | ICD-10-CM | POA: Insufficient documentation

## 2012-12-26 DIAGNOSIS — M25519 Pain in unspecified shoulder: Secondary | ICD-10-CM | POA: Insufficient documentation

## 2012-12-26 DIAGNOSIS — Z8719 Personal history of other diseases of the digestive system: Secondary | ICD-10-CM | POA: Insufficient documentation

## 2012-12-26 DIAGNOSIS — J441 Chronic obstructive pulmonary disease with (acute) exacerbation: Secondary | ICD-10-CM | POA: Insufficient documentation

## 2012-12-26 DIAGNOSIS — R0602 Shortness of breath: Secondary | ICD-10-CM

## 2012-12-26 DIAGNOSIS — D649 Anemia, unspecified: Secondary | ICD-10-CM | POA: Insufficient documentation

## 2012-12-26 DIAGNOSIS — Z87891 Personal history of nicotine dependence: Secondary | ICD-10-CM | POA: Insufficient documentation

## 2012-12-26 DIAGNOSIS — F1010 Alcohol abuse, uncomplicated: Secondary | ICD-10-CM | POA: Insufficient documentation

## 2012-12-26 DIAGNOSIS — Z9981 Dependence on supplemental oxygen: Secondary | ICD-10-CM | POA: Insufficient documentation

## 2012-12-26 MED ORDER — ACETAMINOPHEN 500 MG PO TABS
1000.0000 mg | ORAL_TABLET | Freq: Once | ORAL | Status: AC
  Administered 2012-12-26: 1000 mg via ORAL
  Filled 2012-12-26: qty 2

## 2012-12-26 NOTE — ED Provider Notes (Signed)
History     CSN: [id number]  Arrival date & time 12/26/12  1218   First MD Initiated Contact with Patient 12/26/12 1234      Chief Complaint  Patient presents with  . Shortness of Breath    (Consider location/radiation/quality/duration/timing/severity/associated sxs/prior treatment) HPI Comments: 79 year old male with a history of chronic COPD and schizoaffective disorder who presents to the emergency department with recurrent dyspnea. He states that this is a daily problem for him, he is unable to tell me if the shortness of breath is any worse and is normal shortness of breath. He uses oxygen at home and does not feel like it helps. He was able to catch her ride and ambulate a good bit on the way to the hospital today and this did not make his symptoms any worse. He does complain of right shoulder pain which is worse with moving his arm but denies any swelling of his legs, fever, chills, cough.  Patient is a 79 y.o. male presenting with shortness of breath. The history is provided by the patient and medical records.  Shortness of Breath   Past Medical History  Diagnosis Date  . COPD (chronic obstructive pulmonary disease)   . Anxiety   . Alcohol abuse   . Anemia   . Generalized headaches   . Hypertension   . 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 Pulmonary nodule/lesion, solitary   . Schizoaffective disorder   . GI bleed     History of recurrent bleeding that dates back as far as 2004 per records,    Past Surgical History  Procedure Laterality Date  . Colonoscopy  Sept 2009    SLF: few small AVMs in ascending colon distal to IC . Ablated via BICAP.   Marland Kitchen Esophagogastroduodenoscopy  Sept 2009    SLF: normal esophagus, small HH, 1-2 AVMs actively oozing in mid stomach, s/p BICAP, path benign   . Esophagogastroduodenoscopy  01/18/2012    Rourk-Erosive reflux esophagitis. Noncritical ring. Antral erosions/ small antral  ulcer - appeared innocent  (H pylori serrology negative)  . Colonoscopy  03/09/2012    Procedure: COLONOSCOPY;  Surgeon: West Bali, MD;  Location: [facility];  Service: Endoscopy;  Laterality: N/A;  2:30  . Esophagogastroduodenoscopy  03/09/2012    Procedure: ESOPHAGOGASTRODUODENOSCOPY (EGD);  Surgeon: West Bali, MD;  Location: [facility];  Service: Endoscopy;  Laterality: N/A;    Family History  Problem Relation Age of Onset  . Colon cancer Neg Hx     History  Substance Use Topics  . Smoking status: Former Smoker    Types: Cigarettes    Quit date: 05/07/1969  . Smokeless tobacco: Current User    Types: Snuff  . Alcohol Use: 2.4 oz/week    4 Cans of beer per week     Comment: pt denies      Review of Systems  Respiratory: Positive for shortness of breath.   All other systems reviewed and are negative.    Allergies  Black pepper  Home Medications   Current Outpatient Rx  Name  Route  Sig  Dispense  Refill  . albuterol (PROVENTIL HFA;VENTOLIN HFA) 108 (90 BASE) MCG/ACT inhaler   Inhalation   Inhale 2 puffs into the lungs every 4 (four) hours as needed for wheezing.   1 Inhaler   0   . diazepam (VALIUM) 5 MG tablet   Oral  Take 5 mg by mouth 3 (three) times daily.         . ferrous sulfate 325 (65 FE) MG tablet   Oral   Take 1 tablet (325 mg total) by mouth daily.   30 tablet   0   . HYDROcodone-acetaminophen (NORCO/VICODIN) 5-325 MG per tablet   Oral   Take 0.5-1 tablets by mouth 4 (four) times daily as needed. Pain.         . Ipratropium-Albuterol (COMBIVENT RESPIMAT) 20-100 MCG/ACT AERS respimat   Inhalation   Inhale 1 puff into the lungs every 6 (six) hours as needed for wheezing or shortness of breath.         . predniSONE (DELTASONE) 50 MG tablet      1 tablet PO daily   5 tablet   0     BP 121/60  Pulse 92  Temp(Src) 97.9 F (36.6 C) (Oral)  Resp 22  Wt 130 lb (58.968 kg)  BMI 23.77 kg/m2  SpO2 91%  Physical Exam   Nursing note and vitals reviewed. Constitutional: He appears well-developed and well-nourished. No distress.  HENT:  Head: Normocephalic and atraumatic.  Mouth/Throat: Oropharynx is clear and moist. No oropharyngeal exudate.  Eyes: Conjunctivae and EOM are normal. Pupils are equal, round, and reactive to light. Right eye exhibits no discharge. Left eye exhibits no discharge. No scleral icterus.  Neck: Normal range of motion. Neck supple. No JVD present. No thyromegaly present.  Cardiovascular: Normal rate, regular rhythm, normal heart sounds and intact distal pulses.  Exam reveals no gallop and no friction rub.   No murmur heard. Pulmonary/Chest: Effort normal. No respiratory distress. He has wheezes. He has no rales.  Minimal end expiratory wheezing, no increased work of breathing, speaks in full sentences  Abdominal: Soft. Bowel sounds are normal. He exhibits no distension and no mass. There is no tenderness.  Musculoskeletal: Normal range of motion. He exhibits no edema and no tenderness.  Lymphadenopathy:    He has no cervical adenopathy.  Neurological: He is alert. Coordination normal.  Skin: Skin is warm and dry. No rash noted. No erythema.  Psychiatric: He has a normal mood and affect. His behavior is normal.    ED Course  Procedures (including critical care time)  Labs Reviewed - No data to display No results found.   1. Shortness of breath       MDM  The patient appears benign and has reassuring vital signs with oxygen of 100% on low-dose oxygen by nasal cannula. He does not require imaging or any other testing at this time. He is seen normal daily for his shortness of breath and I do not feel that there is an emergency medical condition that needs further evaluation or stabilization at this time. He is able to ambulate without any difficulty or dyspnea today, I personally visualized him walking down the hallway and ambulating around the emergency department. The patient  is stable for discharge        Vida Roller, MD 12/26/12 1300

## 2012-12-26 NOTE — ED Notes (Signed)
Pt c/o SOB since waking this morning.

## 2012-12-31 ENCOUNTER — Emergency Department (HOSPITAL_COMMUNITY)
Admission: EM | Admit: 2012-12-31 | Discharge: 2013-01-01 | Disposition: A | Discharge status: Home / Self Care | Payer: PRIVATE HEALTH INSURANCE | Attending: Emergency Medicine | Admitting: Emergency Medicine

## 2012-12-31 ENCOUNTER — Encounter (HOSPITAL_COMMUNITY): Payer: Self-pay | Admitting: Emergency Medicine

## 2012-12-31 DIAGNOSIS — IMO0002 Reserved for concepts with insufficient information to code with codable children: Secondary | ICD-10-CM | POA: Insufficient documentation

## 2012-12-31 DIAGNOSIS — D649 Anemia, unspecified: Secondary | ICD-10-CM | POA: Insufficient documentation

## 2012-12-31 DIAGNOSIS — Z8709 Personal history of other diseases of the respiratory system: Secondary | ICD-10-CM | POA: Insufficient documentation

## 2012-12-31 DIAGNOSIS — J441 Chronic obstructive pulmonary disease with (acute) exacerbation: Secondary | ICD-10-CM | POA: Insufficient documentation

## 2012-12-31 DIAGNOSIS — I10 Essential (primary) hypertension: Secondary | ICD-10-CM | POA: Insufficient documentation

## 2012-12-31 DIAGNOSIS — J449 Chronic obstructive pulmonary disease, unspecified: Secondary | ICD-10-CM

## 2012-12-31 DIAGNOSIS — Z8719 Personal history of other diseases of the digestive system: Secondary | ICD-10-CM | POA: Insufficient documentation

## 2012-12-31 DIAGNOSIS — F411 Generalized anxiety disorder: Secondary | ICD-10-CM | POA: Insufficient documentation

## 2012-12-31 DIAGNOSIS — Z79899 Other long term (current) drug therapy: Secondary | ICD-10-CM | POA: Insufficient documentation

## 2012-12-31 DIAGNOSIS — Z87891 Personal history of nicotine dependence: Secondary | ICD-10-CM | POA: Insufficient documentation

## 2012-12-31 DIAGNOSIS — Z8659 Personal history of other mental and behavioral disorders: Secondary | ICD-10-CM | POA: Insufficient documentation

## 2012-12-31 MED ORDER — ALBUTEROL SULFATE (5 MG/ML) 0.5% IN NEBU
5.0000 mg | INHALATION_SOLUTION | Freq: Once | RESPIRATORY_TRACT | Status: AC
  Administered 2013-01-01: 5 mg via RESPIRATORY_TRACT
  Filled 2012-12-31: qty 1

## 2012-12-31 MED ORDER — ALBUTEROL SULFATE HFA 108 (90 BASE) MCG/ACT IN AERS
2.0000 | INHALATION_SPRAY | Freq: Once | RESPIRATORY_TRACT | Status: AC
  Administered 2013-01-01: 2 via RESPIRATORY_TRACT
  Filled 2012-12-31: qty 6.7

## 2012-12-31 MED ORDER — IPRATROPIUM BROMIDE 0.02 % IN SOLN
0.5000 mg | Freq: Once | RESPIRATORY_TRACT | Status: AC
  Administered 2013-01-01: 0.5 mg via RESPIRATORY_TRACT
  Filled 2012-12-31: qty 2.5

## 2012-12-31 NOTE — ED Provider Notes (Addendum)
History     CSN: [id number]  Arrival date & time 12/31/12  2308   First MD Initiated Contact with Patient 12/31/12 2322      Chief Complaint  Patient presents with  . Shortness of Breath    (Consider location/radiation/quality/duration/timing/severity/associated sxs/prior treatment) HPI... regular visitor to the emergency department with COPD exacerbation. He appears to be his normal self. He is requesting a breathing treatment. No fever, sweats, chills, rusty sputum. Severity is mild.  Nothing makes symptoms better or worse  Past Medical History  Diagnosis Date  . COPD (chronic obstructive pulmonary disease)   . Anxiety   . Alcohol abuse   . Anemia   . Generalized headaches   . Hypertension   . 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 Pulmonary nodule/lesion, solitary   . Schizoaffective disorder   . GI bleed     History of recurrent bleeding that dates back as far as 2004 per records,    Past Surgical History  Procedure Laterality Date  . Colonoscopy  Sept 2009    SLF: few small AVMs in ascending colon distal to IC . Ablated via BICAP.   Marland Kitchen Esophagogastroduodenoscopy  Sept 2009    SLF: normal esophagus, small HH, 1-2 AVMs actively oozing in mid stomach, s/p BICAP, path benign   . Esophagogastroduodenoscopy  01/18/2012    Rourk-Erosive reflux esophagitis. Noncritical ring. Antral erosions/ small antral ulcer - appeared innocent  (H pylori serrology negative)  . Colonoscopy  03/09/2012    Procedure: COLONOSCOPY;  Surgeon: West Bali, MD;  Location: [facility];  Service: Endoscopy;  Laterality: N/A;  2:30  . Esophagogastroduodenoscopy  03/09/2012    Procedure: ESOPHAGOGASTRODUODENOSCOPY (EGD);  Surgeon: West Bali, MD;  Location: [facility];  Service: Endoscopy;  Laterality: N/A;    Family History  Problem Relation Age of Onset  . Colon cancer Neg Hx     History  Substance Use Topics  . Smoking status:  Former Smoker    Types: Cigarettes    Quit date: 05/07/1969  . Smokeless tobacco: Current User    Types: Snuff  . Alcohol Use: 2.4 oz/week    4 Cans of beer per week     Comment: pt denies      Review of Systems  All other systems reviewed and are negative.    Allergies  Black pepper  Home Medications   Current Outpatient Rx  Name  Route  Sig  Dispense  Refill  . albuterol (PROVENTIL HFA;VENTOLIN HFA) 108 (90 BASE) MCG/ACT inhaler   Inhalation   Inhale 2 puffs into the lungs every 4 (four) hours as needed for wheezing.   1 Inhaler   0   . diazepam (VALIUM) 5 MG tablet   Oral   Take 5 mg by mouth 3 (three) times daily.         . ferrous sulfate 325 (65 FE) MG tablet   Oral   Take 1 tablet (325 mg total) by mouth daily.   30 tablet   0   . HYDROcodone-acetaminophen (NORCO/VICODIN) 5-325 MG per tablet   Oral   Take 0.5-1 tablets by mouth 4 (four) times daily as needed. Pain.         . Ipratropium-Albuterol (COMBIVENT RESPIMAT) 20-100 MCG/ACT AERS respimat   Inhalation   Inhale 1 puff into the lungs every 6 (six) hours as needed for wheezing or shortness of breath.         Marland Kitchen  predniSONE (DELTASONE) 50 MG tablet      1 tablet PO daily   5 tablet   0     BP 156/85  Pulse 90  Temp(Src) 98.2 F (36.8 C) (Oral)  Resp 26  Ht 5\' 2"  (1.575 m)  Wt 130 lb (58.968 kg)  BMI 23.77 kg/m2  SpO2 96%  Physical Exam  Nursing note and vitals reviewed. Constitutional: He is oriented to person, place, and time. He appears well-developed and well-nourished.  HENT:  Head: Normocephalic and atraumatic.  Eyes: Conjunctivae and EOM are normal. Pupils are equal, round, and reactive to light.  Neck: Normal range of motion. Neck supple.  Cardiovascular: Normal rate, regular rhythm and normal heart sounds.   Pulmonary/Chest: Effort normal and breath sounds normal.  Abdominal: Soft. Bowel sounds are normal.  Musculoskeletal: Normal range of motion.  Neurological: He is  alert and oriented to person, place, and time.  Skin: Skin is warm and dry.  Psychiatric: He has a normal mood and affect.    ED Course  Procedures (including critical care time)  Labs Reviewed - No data to display No results found.   No diagnosis found.    MDM  Patient is well-known to me. He appears to be baseline. Will give breathing treatment and albuterol inhaler to go home.        Donnetta Hutching, MD 12/31/12 2350  Donnetta Hutching, MD 01/01/13 [phone]

## 2012-12-31 NOTE — ED Notes (Signed)
Pt c/o sob and states he needs a puffer.

## 2012-12-31 NOTE — ED Notes (Signed)
respiratory notified of order for breathing treatments and inhaler.

## 2013-01-01 ENCOUNTER — Encounter (HOSPITAL_COMMUNITY): Payer: Self-pay | Admitting: *Deleted

## 2013-01-01 ENCOUNTER — Emergency Department (HOSPITAL_COMMUNITY): Payer: PRIVATE HEALTH INSURANCE

## 2013-01-01 ENCOUNTER — Emergency Department (HOSPITAL_COMMUNITY)
Admission: EM | Admit: 2013-01-01 | Discharge: 2013-01-01 | Discharge status: Against Medical Advice | Payer: PRIVATE HEALTH INSURANCE | Attending: Emergency Medicine | Admitting: Emergency Medicine

## 2013-01-01 DIAGNOSIS — J449 Chronic obstructive pulmonary disease, unspecified: Secondary | ICD-10-CM | POA: Insufficient documentation

## 2013-01-01 DIAGNOSIS — Z8679 Personal history of other diseases of the circulatory system: Secondary | ICD-10-CM | POA: Insufficient documentation

## 2013-01-01 DIAGNOSIS — R0602 Shortness of breath: Secondary | ICD-10-CM | POA: Insufficient documentation

## 2013-01-01 DIAGNOSIS — Z862 Personal history of diseases of the blood and blood-forming organs and certain disorders involving the immune mechanism: Secondary | ICD-10-CM | POA: Insufficient documentation

## 2013-01-01 DIAGNOSIS — J4489 Other specified chronic obstructive pulmonary disease: Secondary | ICD-10-CM | POA: Insufficient documentation

## 2013-01-01 DIAGNOSIS — Z8709 Personal history of other diseases of the respiratory system: Secondary | ICD-10-CM | POA: Insufficient documentation

## 2013-01-01 DIAGNOSIS — Z79899 Other long term (current) drug therapy: Secondary | ICD-10-CM | POA: Insufficient documentation

## 2013-01-01 DIAGNOSIS — I10 Essential (primary) hypertension: Secondary | ICD-10-CM | POA: Insufficient documentation

## 2013-01-01 DIAGNOSIS — Z87891 Personal history of nicotine dependence: Secondary | ICD-10-CM | POA: Insufficient documentation

## 2013-01-01 DIAGNOSIS — Z8659 Personal history of other mental and behavioral disorders: Secondary | ICD-10-CM | POA: Insufficient documentation

## 2013-01-01 DIAGNOSIS — Z8719 Personal history of other diseases of the digestive system: Secondary | ICD-10-CM | POA: Insufficient documentation

## 2013-01-01 DIAGNOSIS — F411 Generalized anxiety disorder: Secondary | ICD-10-CM | POA: Insufficient documentation

## 2013-01-01 MED ORDER — ALBUTEROL SULFATE (5 MG/ML) 0.5% IN NEBU
2.5000 mg | INHALATION_SOLUTION | Freq: Once | RESPIRATORY_TRACT | Status: AC
  Administered 2013-01-01: 2.5 mg via RESPIRATORY_TRACT
  Filled 2013-01-01: qty 0.5

## 2013-01-01 MED ORDER — IPRATROPIUM BROMIDE 0.02 % IN SOLN
0.5000 mg | Freq: Once | RESPIRATORY_TRACT | Status: AC
  Administered 2013-01-01: 0.5 mg via RESPIRATORY_TRACT
  Filled 2013-01-01: qty 2.5

## 2013-01-01 NOTE — ED Notes (Signed)
edp in to advise pt he is going to be d/c. Pt leaving, states "I am going to drug store and see if they can give me something" per edp. Nad. No distress

## 2013-01-01 NOTE — ED Notes (Signed)
Patient resting comfortably in bed at this time. Patient does not have ride home, so allowing patient to rest.

## 2013-01-01 NOTE — ED Provider Notes (Signed)
History  This chart was scribed for Glynn Octave, MD by Bennett Scrape, ED Scribe. This patient was seen in room APA02/APA02 and the patient's care was started at 3:20 PM.  CSN: [id number]  Arrival date & time 01/01/13  1507   First MD Initiated Contact with Patient 01/01/13 1520      Chief Complaint  Patient presents with  . Shortness of Breath    The history is provided by the patient. No language interpreter was used.   [name] is a 80 y.o. male with a h/o COPD who presents to the Emergency Department complaining of 2 days of gradual onset, non-changing, constant SOB. Pt was seen here yesterday and was discharged home after a breathing treatment but states that the SOB persists. He reports that he used his inhaler today with no improvement. He denies cough, fevers, CP, and leg pain as associated symptoms.   He is a frequent visitor to the ED with 66 visits in the last 6 months for the same.  Past Medical History  Diagnosis Date  . COPD (chronic obstructive pulmonary disease)   . Anxiety   . Alcohol abuse   . Anemia   . Generalized headaches   . Hypertension   . 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 Pulmonary nodule/lesion, solitary   . Schizoaffective disorder   . GI bleed     History of recurrent bleeding that dates back as far as 2004 per records,    Past Surgical History  Procedure Laterality Date  . Colonoscopy  Sept 2009    SLF: few small AVMs in ascending colon distal to IC . Ablated via BICAP.   Marland Kitchen Esophagogastroduodenoscopy  Sept 2009    SLF: normal esophagus, small HH, 1-2 AVMs actively oozing in mid stomach, s/p BICAP, path benign   . Esophagogastroduodenoscopy  01/18/2012    Rourk-Erosive reflux esophagitis. Noncritical ring. Antral erosions/ small antral ulcer - appeared innocent  (H pylori serrology negative)  . Colonoscopy  03/09/2012    Procedure: COLONOSCOPY;  Surgeon: West Bali, MD;   Location: [facility];  Service: Endoscopy;  Laterality: N/A;  2:30  . Esophagogastroduodenoscopy  03/09/2012    Procedure: ESOPHAGOGASTRODUODENOSCOPY (EGD);  Surgeon: West Bali, MD;  Location: [facility];  Service: Endoscopy;  Laterality: N/A;    Family History  Problem Relation Age of Onset  . Colon cancer Neg Hx     History  Substance Use Topics  . Smoking status: Former Smoker    Types: Cigarettes    Quit date: 05/07/1969  . Smokeless tobacco: Current User    Types: Snuff  . Alcohol Use: 2.4 oz/week    4 Cans of beer per week     Comment: pt denies      Review of Systems  A complete 10 system review of systems was obtained and all systems are negative except as noted in the HPI and PMH.   Allergies  Black pepper  Home Medications   Current Outpatient Rx  Name  Route  Sig  Dispense  Refill  . albuterol (PROVENTIL HFA;VENTOLIN HFA) 108 (90 BASE) MCG/ACT inhaler   Inhalation   Inhale 2 puffs into the lungs every 4 (four) hours as needed for wheezing.   1 Inhaler   0   . Ipratropium-Albuterol (COMBIVENT RESPIMAT) 20-100 MCG/ACT AERS respimat   Inhalation   Inhale 1 puff into the lungs every 6 (  six) hours as needed for wheezing or shortness of breath.         . diazepam (VALIUM) 5 MG tablet   Oral   Take 5 mg by mouth 3 (three) times daily.         Marland Kitchen HYDROcodone-acetaminophen (NORCO/VICODIN) 5-325 MG per tablet   Oral   Take 0.5-1 tablets by mouth 4 (four) times daily as needed. Pain.         . predniSONE (DELTASONE) 50 MG tablet      1 tablet PO daily   5 tablet   0     Triage Vitals: BP 106/65  Pulse 51  Temp(Src) 97.5 F (36.4 C) (Oral)  Resp 22  SpO2 98%  Physical Exam  Nursing note and vitals reviewed. Constitutional: He is oriented to person, place, and time. He appears well-developed and well-nourished. No distress.  HENT:  Head: Normocephalic and atraumatic.  Mouth/Throat: Oropharynx is clear and moist.  Eyes:  Conjunctivae and EOM are normal. Pupils are equal, round, and reactive to light.  Neck: Normal range of motion. Neck supple. No tracheal deviation present.  Cardiovascular: Normal rate and regular rhythm.  Exam reveals no gallop and no friction rub.   No murmur heard. Pulmonary/Chest: Effort normal. No respiratory distress. He has wheezes (scattered expiratory wheezing). He has no rales.  Speaking in full sentences, decreased breath sounds bilaterally  Abdominal: Soft. There is no tenderness.  Musculoskeletal: Normal range of motion. He exhibits no edema (no pedal edema) and no tenderness (no calf tenderness).  Neurological: He is alert and oriented to person, place, and time.  Skin: Skin is warm and dry.  Psychiatric: He has a normal mood and affect. His behavior is normal.    ED Course  Procedures (including critical care time)  DIAGNOSTIC STUDIES: Oxygen Saturation is 98% on room air, normal by my interpretation.    COORDINATION OF CARE: 3:28 PM-Discussed treatment plan which includes breathing treatment with pt at bedside and pt agreed to plan.   Labs Reviewed - No data to display Dg Neck Soft Tissue  01/01/2013  *RADIOLOGY REPORT*  Clinical Data: History of shortness of breath.  NECK SOFT TISSUES - 1+ VIEW  Comparison: None.  Findings: No prevertebral soft tissue swelling is seen.  Epiglottis is upper normal thickness.  There is narrowing of intervertebral disc spaces at levels of C3-C4, C4-C5, and C5-C6.  There is multilevel marginal osteophyte formation.  No fracture or bony destruction is evident.  IMPRESSION: No soft tissue lesion is evident.  Changes of degenerative disc disease and degenerative spondylosis are present.   Original Report Authenticated By: Onalee Hua Call    Dg Chest 2 View  01/01/2013  *RADIOLOGY REPORT*  Clinical Data: Choking sensation.  Inability to swallow.  Shortness of breath.  CHEST - 2 VIEW  Comparison: 12/10/2012.  12/23/2012.CT 09/12/2012.  Findings: Cardiac  silhouette is normal size and shape. Ectasia and nonaneurysmal calcification of the thoracic aorta are seen. Mediastinal and hilar contours appear stable.  There is generalized hyperinflation configuration.  The diaphragm is low-lying with flattening on lateral image.  No consolidation or mass is seen. Small right upper lobe nodular area is unchanged from prior CT consistent with granuloma.  There is emphysematous change and fibrosis evident in the lower left lung unchanged. No pleural effusion is seen.  There is slight scoliosis.  There is minimal degenerative spondylosis.  IMPRESSION: Generalized hyperinflation configuration consistent with COPD.  No pulmonary edema, pneumonia, or pleural effusion is seen.  Multiple  chronic abnormalities appear stable compared to prior studies.   Original Report Authenticated By: Onalee Hua Call      1. Shortness of breath       MDM  Patient with history of COPD and frequent visits to the ER complaining of shortness of breath presenting with shortness of breath since yesterday. Denies coughing, fever or chest pain. Seen overnight with same complaint.  He is in no distress, speaking in full sentences, no wheezing on exam and appears to be at his baseline.denies any difficulty swallowing, vomiting or nausea.  He is given a nebulizer in the ED. Afterwards he is fully dressed and wandering the hallway stating he is going to the drug store where they can help him. He will leave AGAINST MEDICAL ADVICE as he frequently does.   I personally performed the services described in this documentation, which was scribed in my presence. The recorded information has been reviewed and is accurate.    Glynn Octave, MD 01/01/13 [phone]

## 2013-01-01 NOTE — ED Notes (Signed)
Pt with SOB since yesterday, denies coughing or fever

## 2013-01-03 ENCOUNTER — Emergency Department (HOSPITAL_COMMUNITY)
Admission: EM | Admit: 2013-01-03 | Discharge: 2013-01-03 | Disposition: A | Discharge status: Home / Self Care | Payer: PRIVATE HEALTH INSURANCE

## 2013-01-03 NOTE — ED Notes (Signed)
Pt notified registration that he was leaving stating, " there are too many people here. "

## 2013-01-06 ENCOUNTER — Encounter (HOSPITAL_COMMUNITY): Payer: Self-pay

## 2013-01-06 ENCOUNTER — Emergency Department (HOSPITAL_COMMUNITY): Payer: PRIVATE HEALTH INSURANCE

## 2013-01-06 ENCOUNTER — Other Ambulatory Visit: Payer: Self-pay

## 2013-01-06 ENCOUNTER — Emergency Department (HOSPITAL_COMMUNITY)
Admission: EM | Admit: 2013-01-06 | Discharge: 2013-01-06 | Discharge status: Against Medical Advice | Payer: PRIVATE HEALTH INSURANCE | Attending: Emergency Medicine | Admitting: Emergency Medicine

## 2013-01-06 DIAGNOSIS — J4489 Other specified chronic obstructive pulmonary disease: Secondary | ICD-10-CM | POA: Insufficient documentation

## 2013-01-06 DIAGNOSIS — Z8709 Personal history of other diseases of the respiratory system: Secondary | ICD-10-CM | POA: Insufficient documentation

## 2013-01-06 DIAGNOSIS — M25519 Pain in unspecified shoulder: Secondary | ICD-10-CM | POA: Insufficient documentation

## 2013-01-06 DIAGNOSIS — I10 Essential (primary) hypertension: Secondary | ICD-10-CM | POA: Insufficient documentation

## 2013-01-06 DIAGNOSIS — J449 Chronic obstructive pulmonary disease, unspecified: Secondary | ICD-10-CM | POA: Insufficient documentation

## 2013-01-06 DIAGNOSIS — Z8719 Personal history of other diseases of the digestive system: Secondary | ICD-10-CM | POA: Insufficient documentation

## 2013-01-06 DIAGNOSIS — Z87891 Personal history of nicotine dependence: Secondary | ICD-10-CM | POA: Insufficient documentation

## 2013-01-06 DIAGNOSIS — R0902 Hypoxemia: Secondary | ICD-10-CM | POA: Insufficient documentation

## 2013-01-06 DIAGNOSIS — R0789 Other chest pain: Secondary | ICD-10-CM | POA: Insufficient documentation

## 2013-01-06 DIAGNOSIS — Z79899 Other long term (current) drug therapy: Secondary | ICD-10-CM | POA: Insufficient documentation

## 2013-01-06 DIAGNOSIS — Z8659 Personal history of other mental and behavioral disorders: Secondary | ICD-10-CM | POA: Insufficient documentation

## 2013-01-06 DIAGNOSIS — Z862 Personal history of diseases of the blood and blood-forming organs and certain disorders involving the immune mechanism: Secondary | ICD-10-CM | POA: Insufficient documentation

## 2013-01-06 MED ORDER — ALBUTEROL SULFATE (5 MG/ML) 0.5% IN NEBU
2.5000 mg | INHALATION_SOLUTION | Freq: Once | RESPIRATORY_TRACT | Status: AC
  Administered 2013-01-06: 2.5 mg via RESPIRATORY_TRACT
  Filled 2013-01-06: qty 0.5

## 2013-01-06 MED ORDER — IPRATROPIUM BROMIDE 0.02 % IN SOLN
0.5000 mg | Freq: Once | RESPIRATORY_TRACT | Status: AC
Start: 2013-01-06 — End: 2013-01-06
  Administered 2013-01-06: 0.5 mg via RESPIRATORY_TRACT
  Filled 2013-01-06: qty 2.5

## 2013-01-06 NOTE — ED Notes (Signed)
Pt refused further treatment.  Became agitated, started cursing staff.  PT did not want to be on the monitor or have any blood work done.  Pt was seen by PCP.

## 2013-01-06 NOTE — ED Provider Notes (Signed)
History    This chart was scribed for Shelda Jakes, MD by Sofie Rower, ED Scribe. The patient was seen in room APA10/APA10 and the patient's care was started at 3:16PM.    CSN: [id number]  Arrival date & time 01/06/13  1451   First MD Initiated Contact with Patient 01/06/13 1516      Chief Complaint  Patient presents with  . Shortness of Breath    (Consider location/radiation/quality/duration/timing/severity/associated sxs/prior treatment) Patient is a 79 y.o. male presenting with shortness of breath. The history is provided by the patient. The history is limited by the condition of the patient. No language interpreter was used.  Shortness of Breath Severity:  Moderate Onset quality:  Sudden Timing:  Constant Progression:  Worsening Chronicity:  Recurrent Context: activity   Relieved by:  Oxygen (breathing oxygen (2L) within the ED) Worsened by:  Activity Ineffective treatments:  Inhaler   [name] is a 79 y.o. male , with a hx of COPD and anxiety, who presents to the Emergency Department complaining of sudden, progressively worsening, shortness of breath, onset today (01/06/13). Associated symptoms include shoulder pain located at the right shoulder and diffuse chest pain (characterized as a tight sensation). The pt reports he was outside, working in the yard earlier this afternoon, when he suddenly became short of breath. The pt has taken his albuterol inhaler which does not provide relief of the shortness of breath. Furthermore, the pt informs he does not breath oxygen at home. Important to note, the pt is a frequent visitor of the Emergency department, he has been here 66 times within the last 6 months.   The pt denies experiencing any cough, however, he does not recall if he has experienced any fever episodes lately.   The pt is a former smoker, however, uses smokeless tobacco and drinks alcohol.   PCP is Dr. Juanetta Gosling.    Past Medical History  Diagnosis Date  . COPD  (chronic obstructive pulmonary disease)   . Anxiety   . Alcohol abuse   . Anemia   . Generalized headaches   . Hypertension   . 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 Pulmonary nodule/lesion, solitary   . Schizoaffective disorder   . GI bleed     History of recurrent bleeding that dates back as far as 2004 per records,    Past Surgical History  Procedure Laterality Date  . Colonoscopy  Sept 2009    SLF: few small AVMs in ascending colon distal to IC . Ablated via BICAP.   Marland Kitchen Esophagogastroduodenoscopy  Sept 2009    SLF: normal esophagus, small HH, 1-2 AVMs actively oozing in mid stomach, s/p BICAP, path benign   . Esophagogastroduodenoscopy  01/18/2012    Rourk-Erosive reflux esophagitis. Noncritical ring. Antral erosions/ small antral ulcer - appeared innocent  (H pylori serrology negative)  . Colonoscopy  03/09/2012    Procedure: COLONOSCOPY;  Surgeon: West Bali, MD;  Location: [facility];  Service: Endoscopy;  Laterality: N/A;  2:30  . Esophagogastroduodenoscopy  03/09/2012    Procedure: ESOPHAGOGASTRODUODENOSCOPY (EGD);  Surgeon: West Bali, MD;  Location: [facility];  Service: Endoscopy;  Laterality: N/A;    Family History  Problem Relation Age of Onset  . Colon cancer Neg Hx     History  Substance Use Topics  . Smoking status: Former Smoker    Types: Cigarettes    Quit date:  05/07/1969  . Smokeless tobacco: Current User    Types: Snuff  . Alcohol Use: 2.4 oz/week    4 Cans of beer per week     Comment: occ      Review of Systems  Unable to perform ROS   Allergies  Black pepper  Home Medications   Current Outpatient Rx  Name  Route  Sig  Dispense  Refill  . albuterol (PROVENTIL HFA;VENTOLIN HFA) 108 (90 BASE) MCG/ACT inhaler   Inhalation   Inhale 2 puffs into the lungs every 4 (four) hours as needed for wheezing.   1 Inhaler   0   . Ipratropium-Albuterol (COMBIVENT RESPIMAT) 20-100  MCG/ACT AERS respimat   Inhalation   Inhale 1 puff into the lungs every 6 (six) hours as needed for wheezing or shortness of breath.           BP 120/71  Pulse 89  Temp(Src) 97.9 F (36.6 C) (Oral)  Resp 22  SpO2 96%  Physical Exam  Nursing note and vitals reviewed. Constitutional: He is oriented to person, place, and time. He appears well-developed and well-nourished. No distress.  HENT:  Head: Normocephalic and atraumatic.  Mouth/Throat: Oropharynx is clear and moist.  Eyes: EOM are normal. Pupils are equal, round, and reactive to light. No scleral icterus.  Neck: Neck supple. No tracheal deviation present.  Cardiovascular: Normal rate, regular rhythm and normal heart sounds.   No murmur heard. Pulmonary/Chest: Effort normal and breath sounds normal. No respiratory distress. He has no wheezes.  Abdominal: Soft. Bowel sounds are normal. He exhibits no distension. There is no tenderness.  Musculoskeletal: Normal range of motion. He exhibits no edema.  Neurological: He is alert and oriented to person, place, and time. No cranial nerve deficit or sensory deficit.  Skin: Skin is warm and dry.  Psychiatric: He has a normal mood and affect. His behavior is normal.    ED Course  Procedures (including critical care time)  DIAGNOSTIC STUDIES: Oxygen Saturation is 96% on [place], normal by my interpretation.    COORDINATION OF CARE:   3:53 PM- Treatment plan concerning laboratory evaluation and breathing treatment discussed with patient. Pt agrees with treatment.  3:55 PM- O2 status are 92-94% at this time. Treatment plan discussed with patient. Pt agrees with treatment.        Labs Reviewed  CBC WITH DIFFERENTIAL   Dg Chest 2 View  01/06/2013  *RADIOLOGY REPORT*  Clinical Data: Shortness of breath.  Neck and shoulder pain.  CHEST - 2 VIEW  Comparison: PA and lateral chest 01/01/2013 and 10/14.  CT chest 09/12/2012.  Findings: The lungs are emphysematous but clear.  Heart size  is normal.  No pneumothorax or pleural effusion.  IMPRESSION: Emphysema without acute disease.   Original Report Authenticated By: Holley Dexter, M.D.     Date: 01/06/2013  Rate: 86  Rhythm: normal sinus rhythm  QRS Axis: normal  Intervals: normal  ST/T Wave abnormalities: normal  Conduction Disutrbances:none  Narrative Interpretation:   Old EKG Reviewed: unchanged From 12/22/12  Results for orders placed during the hospital encounter of 12/23/12  CBC WITH DIFFERENTIAL      Result Value Range   WBC 3.5 (*) 4.0 - 10.5 K/uL   RBC 3.77 (*) 4.22 - 5.81 MIL/uL   Hemoglobin 10.5 (*) 13.0 - 17.0 g/dL   HCT 16.1 (*) 09.6 - 04.5 %   MCV 87.0  78.0 - 100.0 fL   MCH 27.9  26.0 - 34.0 pg  MCHC 32.0  30.0 - 36.0 g/dL   RDW 30.8 (*) 65.7 - 84.6 %   Platelets 325  150 - 400 K/uL   Neutrophils Relative 58  43 - 77 %   Neutro Abs 2.0  1.7 - 7.7 K/uL   Lymphocytes Relative 19  12 - 46 %   Lymphs Abs 0.7  0.7 - 4.0 K/uL   Monocytes Relative 15 (*) 3 - 12 %   Monocytes Absolute 0.5  0.1 - 1.0 K/uL   Eosinophils Relative 7 (*) 0 - 5 %   Eosinophils Absolute 0.3  0.0 - 0.7 K/uL   Basophils Relative 1  0 - 1 %   Basophils Absolute 0.0  0.0 - 0.1 K/uL  BASIC METABOLIC PANEL      Result Value Range   Sodium 138  135 - 145 mEq/L   Potassium 4.0  3.5 - 5.1 mEq/L   Chloride 102  96 - 112 mEq/L   CO2 27  19 - 32 mEq/L   Glucose, Bld 106 (*) 70 - 99 mg/dL   BUN 12  6 - 23 mg/dL   Creatinine, Ser 9.62  0.50 - 1.35 mg/dL   Calcium 9.5  8.4 - 95.2 mg/dL   GFR calc non Af Amer 80 (*) >90 mL/min   GFR calc Af Amer >90  >90 mL/min  TROPONIN I      Result Value Range   Troponin I <0.30  <0.30 ng/mL     1. Hypoxia   2. COPD (chronic obstructive pulmonary disease)       MDM   Patient well-known to me. Tommie Raymond comes to the emergency department complaining of shortness of breath however this time on room air patient's oxygen saturation was 85%. On 2 L of oxygen in the ED room patient sats were  93-94%. Patient also looked as if he was having some increased tachypnea compared to his usual. Chest x-ray was ordered which showed emphysema without any pneumonia or pneumothorax or pulmonary edema. Have ordered breathing treatments for the patient apparently he did receive part of that or all of it and then left AMA without giving notice. Patient is not normally on oxygen at home. Primary care doctors Dr. Juanetta Gosling he's known to have significant COPD suspect today was an exacerbation of that at his first time that he is actually been hypoxic on room air.  Patient was also complaining of some intermittent chest pain that would radiate to his right shoulder area.      I personally performed the services described in this documentation, which was scribed in my presence. The recorded information has been reviewed and is accurate.     Shelda Jakes, MD 01/06/13 [phone]

## 2013-01-06 NOTE — ED Notes (Signed)
Pt c/o SOB starting a couple of hours ago.  Pt's room air 02 sat was 85%.  Sat increased to 95% on 2liters.  Pt c/o intermittent  r arm and shoulder pain for past few days.

## 2013-01-08 ENCOUNTER — Encounter (HOSPITAL_COMMUNITY): Payer: Self-pay | Admitting: Emergency Medicine

## 2013-01-08 ENCOUNTER — Emergency Department (HOSPITAL_COMMUNITY)
Admission: EM | Admit: 2013-01-08 | Discharge: 2013-01-08 | Disposition: A | Discharge status: Home / Self Care | Payer: PRIVATE HEALTH INSURANCE | Attending: Emergency Medicine | Admitting: Emergency Medicine

## 2013-01-08 ENCOUNTER — Emergency Department (HOSPITAL_COMMUNITY): Payer: PRIVATE HEALTH INSURANCE

## 2013-01-08 DIAGNOSIS — Z79899 Other long term (current) drug therapy: Secondary | ICD-10-CM | POA: Insufficient documentation

## 2013-01-08 DIAGNOSIS — Z8709 Personal history of other diseases of the respiratory system: Secondary | ICD-10-CM | POA: Insufficient documentation

## 2013-01-08 DIAGNOSIS — Z8719 Personal history of other diseases of the digestive system: Secondary | ICD-10-CM | POA: Insufficient documentation

## 2013-01-08 DIAGNOSIS — Z87891 Personal history of nicotine dependence: Secondary | ICD-10-CM | POA: Insufficient documentation

## 2013-01-08 DIAGNOSIS — Z862 Personal history of diseases of the blood and blood-forming organs and certain disorders involving the immune mechanism: Secondary | ICD-10-CM | POA: Insufficient documentation

## 2013-01-08 DIAGNOSIS — Z8659 Personal history of other mental and behavioral disorders: Secondary | ICD-10-CM | POA: Insufficient documentation

## 2013-01-08 DIAGNOSIS — J441 Chronic obstructive pulmonary disease with (acute) exacerbation: Secondary | ICD-10-CM | POA: Insufficient documentation

## 2013-01-08 DIAGNOSIS — I10 Essential (primary) hypertension: Secondary | ICD-10-CM | POA: Insufficient documentation

## 2013-01-08 DIAGNOSIS — J45901 Unspecified asthma with (acute) exacerbation: Secondary | ICD-10-CM | POA: Insufficient documentation

## 2013-01-08 DIAGNOSIS — R0789 Other chest pain: Secondary | ICD-10-CM | POA: Insufficient documentation

## 2013-01-08 MED ORDER — ASPIRIN 81 MG PO CHEW: 324.0000 mg | CHEWABLE_TABLET | ORAL | Status: DC

## 2013-01-08 MED ORDER — IPRATROPIUM BROMIDE 0.02 % IN SOLN
0.5000 mg | Freq: Once | RESPIRATORY_TRACT | Status: AC
  Administered 2013-01-08: 0.5 mg via RESPIRATORY_TRACT
  Filled 2013-01-08: qty 2.5

## 2013-01-08 MED ORDER — ALBUTEROL SULFATE (5 MG/ML) 0.5% IN NEBU
2.5000 mg | INHALATION_SOLUTION | Freq: Once | RESPIRATORY_TRACT | Status: AC
  Administered 2013-01-08: 2.5 mg via RESPIRATORY_TRACT
  Filled 2013-01-08: qty 0.5

## 2013-01-08 NOTE — ED Notes (Signed)
Pt wanded by security and placed in a hospital gown. Pt yelling obscenities at staff. Pt room doors closed. nad noted. LAB at bedside pt refusing blood work. MD aware.

## 2013-01-08 NOTE — ED Notes (Signed)
MD aware pt wanting to leave. MD and Charge Nurse at bedside. Charge Nurse discussed with pt and pt verbalized he would allow one tube of blood to be drawn and breathing treatment to be given. Respiratory at bedside.

## 2013-01-08 NOTE — ED Notes (Addendum)
Pt c/o sob x 3 hours. Pt sats 96% on ra upon ems arrival.

## 2013-01-08 NOTE — ED Provider Notes (Addendum)
History    This chart was scribed for [name] Booze, MD by Gerlean Ren, ED Scribe. This patient was seen in room APA18/APA18 and the patient's care was started at 3:12 PM    CSN: [id number]  Arrival date & time 01/08/13  1506   First MD Initiated Contact with Patient 01/08/13 1507      Chief Complaint  Patient presents with  . Shortness of Breath     The history is provided by the patient. No language interpreter was used.  Smokey Melott is a 79 y.o. male with h/o COPD brought in by ambulance to the Emergency Department complaining of constant dyspnea with associated chest tightness that both began today and began worsening while ambulating to the store where pt had beer and food causing chest tightness to worsen.  Both still present.  Pt denies cough, diaphoresis, nausea, emesis.  Pt reports he used the inhaler without improvements to breathing or chest tightness.  Pt has a h/o alcohol abuse and denies tobacco use.     Past Medical History  Diagnosis Date  . COPD (chronic obstructive pulmonary disease)   . Anxiety   . Alcohol abuse   . Anemia   . Generalized headaches   . Hypertension   . 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 Pulmonary nodule/lesion, solitary   . Schizoaffective disorder   . GI bleed     History of recurrent bleeding that dates back as far as 2004 per records,    Past Surgical History  Procedure Laterality Date  . Colonoscopy  Sept 2009    SLF: few small AVMs in ascending colon distal to IC . Ablated via BICAP.   Marland Kitchen Esophagogastroduodenoscopy  Sept 2009    SLF: normal esophagus, small HH, 1-2 AVMs actively oozing in mid stomach, s/p BICAP, path benign   . Esophagogastroduodenoscopy  01/18/2012    Rourk-Erosive reflux esophagitis. Noncritical ring. Antral erosions/ small antral ulcer - appeared innocent  (H pylori serrology negative)  . Colonoscopy  03/09/2012    Procedure: COLONOSCOPY;  Surgeon: West Bali, MD;  Location: [facility];  Service: Endoscopy;  Laterality: N/A;  2:30  . Esophagogastroduodenoscopy  03/09/2012    Procedure: ESOPHAGOGASTRODUODENOSCOPY (EGD);  Surgeon: West Bali, MD;  [facility]: [facility];  Service: Endoscopy;  Laterality: N/A;    Family History  Problem Relation Age of Onset  . Colon cancer Neg Hx     History  Substance Use Topics  . Smoking status: Former Smoker    Types: Cigarettes    Quit date: 05/07/1969  . Smokeless tobacco: Current User    Types: Snuff  . Alcohol Use: 2.4 oz/week    4 Cans of beer per week     Comment: occ      Review of Systems  Constitutional: Negative for diaphoresis.  Respiratory: Positive for chest tightness and shortness of breath. Negative for cough.   Gastrointestinal: Negative for nausea and vomiting.  All other systems reviewed and are negative.    Allergies  Black pepper  Home Medications   Current Outpatient Rx  Name  Route  Sig  Dispense  Refill  . albuterol (PROVENTIL HFA;VENTOLIN HFA) 108 (90 BASE) MCG/ACT inhaler   Inhalation   Inhale 2 puffs into the lungs every 4 (four) hours as needed for wheezing.   1 Inhaler   0   . Ipratropium-Albuterol (COMBIVENT RESPIMAT) 20-100 MCG/ACT AERS  respimat   Inhalation   Inhale 1 puff into the lungs every 6 (six) hours as needed for wheezing or shortness of breath.           BP 138/80  Pulse 105  Temp(Src) 99 F (37.2 C)  Resp 26  Wt 130 lb (58.968 kg)  BMI 23.77 kg/m2  SpO2 96%  Physical Exam  Nursing note and vitals reviewed. Constitutional: He is oriented to person, place, and time. He appears well-developed and well-nourished. No distress.  HENT:  Head: Normocephalic and atraumatic.  Eyes: EOM are normal.  Neck: Neck supple. No tracheal deviation present.  Cardiovascular: Normal rate, regular rhythm and normal heart sounds.   No murmur heard. JVDs present.  Pulmonary/Chest: Effort normal. No respiratory distress. He has no  wheezes. He has rales.  Fine rales at both bases  Musculoskeletal: Normal range of motion.  Neurological: He is alert and oriented to person, place, and time.  Skin: Skin is warm and dry.  Psychiatric: He has a normal mood and affect. His behavior is normal.    ED Course  Procedures (including critical care time) .DIAGNOSTIC STUDIES: Oxygen Saturation is 96% on [place], adequate by my interpretation.    COORDINATION OF CARE: 3:17 PM- Patient informed of clinical course including breathing treatment, blood work, and chest XR.  Pt understands medical decision-making process and agrees with plan.      Dg Chest 2 View  01/06/2013  *RADIOLOGY REPORT*  Clinical Data: Shortness of breath.  Neck and shoulder pain.  CHEST - 2 VIEW  Comparison: PA and lateral chest 01/01/2013 and 10/14.  CT chest 09/12/2012.  Findings: The lungs are emphysematous but clear.  Heart size is normal.  No pneumothorax or pleural effusion.  IMPRESSION: Emphysema without acute disease.   Original Report Authenticated By: Holley Dexter, M.D.    ECG shows normal sinus rhythm with a rate of 97, no ectopy. Normal axis. Normal P wave. Normal QRS. Normal intervals. Normal ST and T waves. Impression: normal ECG. Compared with ECG of March of 9, 2013 and no significant changes are seen   1. COPD with exacerbation       MDM  Exacerbation of COPD. Old records are reviewed and he has multiple ED visits for similar complaints as well as hospitalizations for the same. He has been seen on average twice a week for the last several months. He he is usually given breathing treatments and discharged. He'll be given breathing treatment, and chest x-ray and ECG will be checked.  Chest x-ray is unremarkable. The patient has refused lab work and do breathing treatments and states he wishes to leave. He is verbally abusive to staff. However, he does seem to be able to make rational decisions and so he is allowed to leave and is instructed to  followup with his PCP.   I personally performed the services described in this documentation, which was scribed in my presence. The recorded information has been reviewed and is accurate.         [name] Booze, MD 01/08/13 1548  [name] Booze, MD 01/08/13 [phone]

## 2013-01-10 ENCOUNTER — Emergency Department (HOSPITAL_COMMUNITY)
Admission: EM | Admit: 2013-01-10 | Discharge: 2013-01-10 | Disposition: A | Discharge status: Home / Self Care | Payer: PRIVATE HEALTH INSURANCE

## 2013-01-10 NOTE — ED Notes (Signed)
Shortness of breath got worse this morning.  Drank a beer and breathing got better, but has now worsened.

## 2013-01-12 ENCOUNTER — Encounter (HOSPITAL_COMMUNITY): Payer: Self-pay | Admitting: *Deleted

## 2013-01-12 ENCOUNTER — Emergency Department (HOSPITAL_COMMUNITY)
Admission: EM | Admit: 2013-01-12 | Discharge: 2013-01-12 | Disposition: A | Discharge status: Home / Self Care | Payer: PRIVATE HEALTH INSURANCE | Attending: Emergency Medicine | Admitting: Emergency Medicine

## 2013-01-12 DIAGNOSIS — J4489 Other specified chronic obstructive pulmonary disease: Secondary | ICD-10-CM | POA: Insufficient documentation

## 2013-01-12 DIAGNOSIS — Z79899 Other long term (current) drug therapy: Secondary | ICD-10-CM | POA: Insufficient documentation

## 2013-01-12 DIAGNOSIS — J449 Chronic obstructive pulmonary disease, unspecified: Secondary | ICD-10-CM | POA: Insufficient documentation

## 2013-01-12 DIAGNOSIS — R0600 Dyspnea, unspecified: Secondary | ICD-10-CM

## 2013-01-12 DIAGNOSIS — I10 Essential (primary) hypertension: Secondary | ICD-10-CM | POA: Insufficient documentation

## 2013-01-12 DIAGNOSIS — Z862 Personal history of diseases of the blood and blood-forming organs and certain disorders involving the immune mechanism: Secondary | ICD-10-CM | POA: Insufficient documentation

## 2013-01-12 DIAGNOSIS — Z8709 Personal history of other diseases of the respiratory system: Secondary | ICD-10-CM | POA: Insufficient documentation

## 2013-01-12 DIAGNOSIS — Z8659 Personal history of other mental and behavioral disorders: Secondary | ICD-10-CM | POA: Insufficient documentation

## 2013-01-12 DIAGNOSIS — Z8719 Personal history of other diseases of the digestive system: Secondary | ICD-10-CM | POA: Insufficient documentation

## 2013-01-12 DIAGNOSIS — R0989 Other specified symptoms and signs involving the circulatory and respiratory systems: Secondary | ICD-10-CM | POA: Insufficient documentation

## 2013-01-12 DIAGNOSIS — R0609 Other forms of dyspnea: Secondary | ICD-10-CM | POA: Insufficient documentation

## 2013-01-12 MED ORDER — ALBUTEROL SULFATE (5 MG/ML) 0.5% IN NEBU
2.5000 mg | INHALATION_SOLUTION | RESPIRATORY_TRACT | Status: AC
  Administered 2013-01-12: 2.5 mg via RESPIRATORY_TRACT
  Filled 2013-01-12: qty 0.5

## 2013-01-12 MED ORDER — PREDNISONE 50 MG PO TABS
60.0000 mg | ORAL_TABLET | ORAL | Status: AC
  Administered 2013-01-12: 60 mg via ORAL
  Filled 2013-01-12: qty 1

## 2013-01-12 NOTE — ED Notes (Signed)
Pt sitting in wheelchair sleeping.

## 2013-01-12 NOTE — ED Provider Notes (Signed)
History     This chart was scribed for Gerhard Munch, MD, MD by Smitty Pluck, ED Scribe. The patient was seen in room [facility]2/[facility] and the patient's care was started at 2:27 PM.   CSN: [id number]  Arrival date & time 01/12/13  1348        Chief Complaint  Patient presents with  . Shortness of Breath     The history is provided by the patient and medical records. No language interpreter was used.   [name] is a 79 y.o. male with hx of COPD, HTN and anxiety who presents to the Emergency Department complaining of constant, moderate SOB onset today 2 hours ago. Symptoms have worsened since onset. Pt denies LOC, fever, chills, nausea, vomiting, diarrhea, weakness, cough, SOB and any other pain.    Past Medical History  Diagnosis Date  . COPD (chronic obstructive pulmonary disease)   . Anxiety   . Alcohol abuse   . Anemia   . Generalized headaches   . Hypertension   . 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 Pulmonary nodule/lesion, solitary   . Schizoaffective disorder   . GI bleed     History of recurrent bleeding that dates back as far as 2004 per records,    Past Surgical History  Procedure Laterality Date  . Colonoscopy  Sept 2009    SLF: few small AVMs in ascending colon distal to IC . Ablated via BICAP.   Marland Kitchen Esophagogastroduodenoscopy  Sept 2009    SLF: normal esophagus, small HH, 1-2 AVMs actively oozing in mid stomach, s/p BICAP, path benign   . Esophagogastroduodenoscopy  01/18/2012    Rourk-Erosive reflux esophagitis. Noncritical ring. Antral erosions/ small antral ulcer - appeared innocent  (H pylori serrology negative)  . Colonoscopy  03/09/2012    Procedure: COLONOSCOPY;  Surgeon: West Bali, MD;  Location: [facility];  Service: Endoscopy;  Laterality: N/A;  2:30  . Esophagogastroduodenoscopy  03/09/2012    Procedure: ESOPHAGOGASTRODUODENOSCOPY (EGD);  Surgeon: West Bali, MD;  Location: [facility];  Service: Endoscopy;  Laterality: N/A;    Family History  Problem Relation Age of Onset  . Colon cancer Neg Hx     History  Substance Use Topics  . Smoking status: Former Smoker    Types: Cigarettes    Quit date: 05/07/1969  . Smokeless tobacco: Current User    Types: Snuff  . Alcohol Use: 2.4 oz/week    4 Cans of beer per week     Comment: last drank today 01/12/13      Review of Systems  Constitutional:       Per HPI, otherwise negative  HENT:       Per HPI, otherwise negative  Respiratory: Positive for shortness of breath.        Per HPI, otherwise negative  Cardiovascular:       Per HPI, otherwise negative  Gastrointestinal: Negative for vomiting.  Endocrine:       Negative aside from HPI  Genitourinary:       Neg aside from HPI   Musculoskeletal:       Per HPI, otherwise negative  Skin: Negative.   Neurological: Negative for syncope.  All other systems reviewed and are negative.    Allergies  Black pepper  Home Medications   Current Outpatient Rx  Name  Route  Sig  Dispense  Refill  . albuterol (  PROVENTIL HFA;VENTOLIN HFA) 108 (90 BASE) MCG/ACT inhaler   Inhalation   Inhale 2 puffs into the lungs every 4 (four) hours as needed for wheezing.   1 Inhaler   0   . Ipratropium-Albuterol (COMBIVENT RESPIMAT) 20-100 MCG/ACT AERS respimat   Inhalation   Inhale 1 puff into the lungs every 6 (six) hours as needed for wheezing or shortness of breath.           BP 103/68  Pulse 93  Temp(Src) 97.9 F (36.6 C) (Oral)  Resp 24  Wt 130 lb (58.968 kg)  BMI 23.77 kg/m2  SpO2 95%  Physical Exam  Nursing note and vitals reviewed. Constitutional: He is oriented to person, place, and time. He appears well-developed. No distress.  HENT:  Head: Normocephalic and atraumatic.  Eyes: Conjunctivae and EOM are normal.  Cardiovascular: Normal rate and regular rhythm.   Pulmonary/Chest: Effort normal. No stridor. No respiratory distress. He has  wheezes (expiratory). He has no rales.  Abdominal: He exhibits no distension.  Musculoskeletal: He exhibits no edema.  Neurological: He is alert and oriented to person, place, and time.  Skin: Skin is warm and dry.  Psychiatric: He has a normal mood and affect.    ED Course  Procedures (including critical care time) DIAGNOSTIC STUDIES: Oxygen Saturation is 95% on room air, adequate by my interpretation.    COORDINATION OF CARE: 2:29 PM Discussed ED treatment with pt and pt agrees.      Labs Reviewed - No data to display No results found.   No diagnosis found.   3:59 PM On re-exam the patient states that he wants to leave.    MDM   I personally performed the services described in this documentation, which was scribed in my presence. The recorded information has been reviewed and is accurate.   This patient with multiple presentations for similar complaints to this facility now presents with several hours of dyspnea.  On exam the patient is in no distress.  He is afebrile not hypoxic.  Given his history of COPD, he received nebulizer treatment.  The patient left prior to completion of the evaluation, and prior to receiving his paperwork.    Gerhard Munch, MD 01/12/13 [phone]

## 2013-01-12 NOTE — ED Notes (Signed)
Pt said he was going to use the bathroom and get him an asa. Pt did not return to room.

## 2013-01-12 NOTE — ED Notes (Signed)
Sob that started this morning.   

## 2013-01-13 ENCOUNTER — Emergency Department (HOSPITAL_COMMUNITY)
Admission: EM | Admit: 2013-01-13 | Discharge: 2013-01-13 | Disposition: A | Discharge status: Home / Self Care | Payer: PRIVATE HEALTH INSURANCE | Attending: Emergency Medicine | Admitting: Emergency Medicine

## 2013-01-13 ENCOUNTER — Encounter (HOSPITAL_COMMUNITY): Payer: Self-pay | Admitting: Emergency Medicine

## 2013-01-13 DIAGNOSIS — J449 Chronic obstructive pulmonary disease, unspecified: Secondary | ICD-10-CM

## 2013-01-13 DIAGNOSIS — Z8709 Personal history of other diseases of the respiratory system: Secondary | ICD-10-CM | POA: Insufficient documentation

## 2013-01-13 DIAGNOSIS — I10 Essential (primary) hypertension: Secondary | ICD-10-CM | POA: Insufficient documentation

## 2013-01-13 DIAGNOSIS — Z8659 Personal history of other mental and behavioral disorders: Secondary | ICD-10-CM | POA: Insufficient documentation

## 2013-01-13 DIAGNOSIS — Z8719 Personal history of other diseases of the digestive system: Secondary | ICD-10-CM | POA: Insufficient documentation

## 2013-01-13 DIAGNOSIS — Z79899 Other long term (current) drug therapy: Secondary | ICD-10-CM | POA: Insufficient documentation

## 2013-01-13 DIAGNOSIS — Z862 Personal history of diseases of the blood and blood-forming organs and certain disorders involving the immune mechanism: Secondary | ICD-10-CM | POA: Insufficient documentation

## 2013-01-13 DIAGNOSIS — Z87891 Personal history of nicotine dependence: Secondary | ICD-10-CM | POA: Insufficient documentation

## 2013-01-13 DIAGNOSIS — J441 Chronic obstructive pulmonary disease with (acute) exacerbation: Secondary | ICD-10-CM | POA: Insufficient documentation

## 2013-01-13 MED ORDER — ALBUTEROL SULFATE HFA 108 (90 BASE) MCG/ACT IN AERS
1.0000 | INHALATION_SPRAY | RESPIRATORY_TRACT | Status: DC | PRN
  Administered 2013-01-13: 1 via RESPIRATORY_TRACT
  Filled 2013-01-13: qty 6.7

## 2013-01-13 MED ORDER — PREDNISONE 20 MG PO TABS: 40.0000 mg | ORAL_TABLET | Freq: Every day | ORAL | Status: DC

## 2013-01-13 MED ORDER — ALBUTEROL SULFATE (5 MG/ML) 0.5% IN NEBU: 5.0000 mg | INHALATION_SOLUTION | Freq: Once | RESPIRATORY_TRACT | Status: DC

## 2013-01-13 NOTE — ED Notes (Signed)
Pt c/o sob today. States he drank a beer and it did not help. Pt seen in ED yesterday for same.

## 2013-01-13 NOTE — ED Provider Notes (Signed)
History  This chart was scribed for Vida Roller, MD by Bennett Scrape, ED Scribe. This patient was seen in room [facility]6/[facility]6 and the patient's care was started at 6:30 PM.  CSN: [id number]  Arrival date & time      First MD Initiated Contact with Patient 01/13/13 1830      Chief Complaint  Patient presents with  . Shortness of Breath     The history is provided by the patient. No language interpreter was used.    [name] is a 79 y.o. male with a h/o COPD brought in by ambulance, who presents to the Emergency Department complaining of SOB that started today. He reports that he drank a beer with no improvement. He was seen in the ED yesterday for the same. He denies leg swelling.  Pt is a frequent visitor to the ED for the same. He requests a breathing treatment and is uncooperative with other testing. The majority of the time he leaves before getting his discharge paperwork.  Past Medical History  Diagnosis Date  . COPD (chronic obstructive pulmonary disease)   . Anxiety   . Alcohol abuse   . Anemia   . Generalized headaches   . Hypertension   . 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 Pulmonary nodule/lesion, solitary   . Schizoaffective disorder   . GI bleed     History of recurrent bleeding that dates back as far as 2004 per records,    Past Surgical History  Procedure Laterality Date  . Colonoscopy  Sept 2009    SLF: few small AVMs in ascending colon distal to IC . Ablated via BICAP.   Marland Kitchen Esophagogastroduodenoscopy  Sept 2009    SLF: normal esophagus, small HH, 1-2 AVMs actively oozing in mid stomach, s/p BICAP, path benign   . Esophagogastroduodenoscopy  01/18/2012    Rourk-Erosive reflux esophagitis. Noncritical ring. Antral erosions/ small antral ulcer - appeared innocent  (H pylori serrology negative)  . Colonoscopy  03/09/2012    Procedure: COLONOSCOPY;  Surgeon: West Bali, MD;  Location: [facility];  Service: Endoscopy;  Laterality: N/A;  2:30  . Esophagogastroduodenoscopy  03/09/2012    Procedure: ESOPHAGOGASTRODUODENOSCOPY (EGD);  Surgeon: West Bali, MD;  Location: [facility];  Service: Endoscopy;  Laterality: N/A;    Family History  Problem Relation Age of Onset  . Colon cancer Neg Hx     History  Substance Use Topics  . Smoking status: Former Smoker    Types: Cigarettes    Quit date: 05/07/1969  . Smokeless tobacco: Current User    Types: Snuff  . Alcohol Use: 2.4 oz/week    4 Cans of beer per week     Comment: last drank today 01/12/13      Review of Systems  Respiratory: Positive for shortness of breath.   Cardiovascular: Negative for chest pain and leg swelling.  All other systems reviewed and are negative.    Allergies  Black pepper  Home Medications   Current Outpatient Rx  Name  Route  Sig  Dispense  Refill  . albuterol (PROVENTIL HFA;VENTOLIN HFA) 108 (90 BASE) MCG/ACT inhaler   Inhalation   Inhale 2 puffs into the lungs every 4 (four) hours as needed for wheezing.   1 Inhaler   0   . Ipratropium-Albuterol (COMBIVENT RESPIMAT) 20-100 MCG/ACT AERS respimat   Inhalation   Inhale 1 puff  into the lungs every 6 (six) hours as needed for wheezing or shortness of breath.         . predniSONE (DELTASONE) 20 MG tablet   Oral   Take 2 tablets (40 mg total) by mouth daily.   10 tablet   0     Triage Vitals: BP 143/84  Pulse 77  Temp(Src) 97.7 F (36.5 C)  Resp 18  Wt 130 lb (58.968 kg)  BMI 23.77 kg/m2  SpO2 100%  Physical Exam  Nursing note and vitals reviewed. Constitutional: He is oriented to person, place, and time. He appears well-developed and well-nourished. No distress.  HENT:  Head: Normocephalic and atraumatic.  Mouth/Throat: Oropharynx is clear and moist.  Eyes: Conjunctivae and EOM are normal.  Neck: Neck supple. No tracheal deviation present.  Cardiovascular: Normal rate and regular rhythm.  Exam reveals no  gallop and no friction rub.   No murmur heard. Pulmonary/Chest: Effort normal and breath sounds normal. No respiratory distress. He has no wheezes.  Abdominal: Soft. There is no tenderness.  Musculoskeletal: Normal range of motion.  Neurological: He is alert and oriented to person, place, and time.  Skin: Skin is warm and dry.  Psychiatric: He has a normal mood and affect. His behavior is normal.    ED Course  Procedures (including critical care time)  DIAGNOSTIC STUDIES: Oxygen Saturation is 100% on room air, normal by my interpretation.    COORDINATION OF CARE: 6:33 PM-Discussed treatment plan which includes breathing treatment with pt at bedside and pt agreed to plan. Advised pt's nurse to not get him coffee, food or pillows.  Labs Reviewed - No data to display No results found.   1. COPD (chronic obstructive pulmonary disease)       MDM  The pt again presents for his chronic SOB, but has no wheezing and normal sat's.  The pt has been able to ambulate wihtout difficulty and is stable for d/c.  Given MDI dose here and home with prednisone aand recommended ongoing B agonist therapy.  I personally performed the services described in this documentation, which was scribed in my presence. The recorded information has been reviewed and is accurate.          Vida Roller, MD 01/14/13 [phone]

## 2013-01-15 ENCOUNTER — Encounter (HOSPITAL_COMMUNITY): Payer: Self-pay | Admitting: *Deleted

## 2013-01-15 ENCOUNTER — Emergency Department (HOSPITAL_COMMUNITY)
Admission: EM | Admit: 2013-01-15 | Discharge: 2013-01-15 | Discharge status: Against Medical Advice | Payer: PRIVATE HEALTH INSURANCE | Attending: Emergency Medicine | Admitting: Emergency Medicine

## 2013-01-15 DIAGNOSIS — J441 Chronic obstructive pulmonary disease with (acute) exacerbation: Secondary | ICD-10-CM | POA: Insufficient documentation

## 2013-01-15 DIAGNOSIS — J449 Chronic obstructive pulmonary disease, unspecified: Secondary | ICD-10-CM

## 2013-01-15 NOTE — ED Notes (Signed)
Pt c/o SOB that began this morning. Pt states he was seen by Dr. Juanetta Gosling "but he didn't help". Pt states inhaler at home did not help. Pt was ambulatory on arrival.

## 2013-01-15 NOTE — ED Notes (Signed)
Short of breath

## 2013-01-15 NOTE — ED Notes (Signed)
Pt walked to nursing station and told staff he had a pistol in his pocket and was going to shoot me and other staff. At that time security and police were called and pt was taken off property.

## 2013-01-17 ENCOUNTER — Emergency Department (HOSPITAL_COMMUNITY)
Admission: EM | Admit: 2013-01-17 | Discharge: 2013-01-17 | Discharge status: Against Medical Advice | Payer: PRIVATE HEALTH INSURANCE | Attending: Emergency Medicine | Admitting: Emergency Medicine

## 2013-01-17 ENCOUNTER — Encounter (HOSPITAL_COMMUNITY): Payer: Self-pay | Admitting: *Deleted

## 2013-01-17 DIAGNOSIS — Z87738 Personal history of other specified (corrected) congenital malformations of digestive system: Secondary | ICD-10-CM | POA: Insufficient documentation

## 2013-01-17 DIAGNOSIS — Z862 Personal history of diseases of the blood and blood-forming organs and certain disorders involving the immune mechanism: Secondary | ICD-10-CM | POA: Insufficient documentation

## 2013-01-17 DIAGNOSIS — Z8659 Personal history of other mental and behavioral disorders: Secondary | ICD-10-CM | POA: Insufficient documentation

## 2013-01-17 DIAGNOSIS — Z87891 Personal history of nicotine dependence: Secondary | ICD-10-CM | POA: Insufficient documentation

## 2013-01-17 DIAGNOSIS — Z79899 Other long term (current) drug therapy: Secondary | ICD-10-CM | POA: Insufficient documentation

## 2013-01-17 DIAGNOSIS — IMO0002 Reserved for concepts with insufficient information to code with codable children: Secondary | ICD-10-CM | POA: Insufficient documentation

## 2013-01-17 DIAGNOSIS — Z8719 Personal history of other diseases of the digestive system: Secondary | ICD-10-CM | POA: Insufficient documentation

## 2013-01-17 DIAGNOSIS — Z8669 Personal history of other diseases of the nervous system and sense organs: Secondary | ICD-10-CM | POA: Insufficient documentation

## 2013-01-17 DIAGNOSIS — J449 Chronic obstructive pulmonary disease, unspecified: Secondary | ICD-10-CM

## 2013-01-17 DIAGNOSIS — J441 Chronic obstructive pulmonary disease with (acute) exacerbation: Secondary | ICD-10-CM | POA: Insufficient documentation

## 2013-01-17 DIAGNOSIS — I10 Essential (primary) hypertension: Secondary | ICD-10-CM | POA: Insufficient documentation

## 2013-01-17 MED ORDER — IPRATROPIUM BROMIDE 0.02 % IN SOLN
RESPIRATORY_TRACT | Status: AC
  Filled 2013-01-17: qty 2.5

## 2013-01-17 MED ORDER — ALBUTEROL SULFATE (5 MG/ML) 0.5% IN NEBU
INHALATION_SOLUTION | RESPIRATORY_TRACT | Status: AC
  Administered 2013-01-17: 2.5 mg via RESPIRATORY_TRACT
  Filled 2013-01-17: qty 0.5

## 2013-01-17 MED ORDER — IPRATROPIUM BROMIDE 0.02 % IN SOLN
0.5000 mg | Freq: Once | RESPIRATORY_TRACT | Status: AC
  Administered 2013-01-17: 0.5 mg via RESPIRATORY_TRACT

## 2013-01-17 MED ORDER — ALBUTEROL SULFATE (5 MG/ML) 0.5% IN NEBU: 2.5000 mg | INHALATION_SOLUTION | Freq: Once | RESPIRATORY_TRACT | Status: AC

## 2013-01-17 NOTE — ED Provider Notes (Signed)
History  This chart was scribed for Gilda Crease, MD by Shari Heritage, ED Scribe. The patient was seen in room [facility]14/[facility]14. Patient's care was started at 1740.   CSN: [id number]  Arrival date & time 01/17/13  1648   First MD Initiated Contact with Patient 01/17/13 1740      Chief Complaint  Patient presents with  . Shortness of Breath     The history is provided by the patient. No language interpreter was used.    HPI Comments: [name] is a 79 y.o. male who presents to the Emergency Department complaining of moderate, persistent shortness of breath onset this morning. Patient states that he used his inhaler at home with no relief. He is requesting an inhaler and repeats, "I can't breathe." There is no chest pain or leg swelling.  Pt is a frequent visitor to the ED for the same. Patient was last seen for this problem on 01/13/2013. He requests a breathing treatment and is uncooperative with other testing. He often leaves before getting his discharge paperwork.   Past Medical History  Diagnosis Date  . COPD (chronic obstructive pulmonary disease)   . Anxiety   . Alcohol abuse   . Anemia   . Generalized headaches   . Hypertension   . 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 Pulmonary nodule/lesion, solitary   . Schizoaffective disorder   . GI bleed     History of recurrent bleeding that dates back as far as 2004 per records,    Past Surgical History  Procedure Laterality Date  . Colonoscopy  Sept 2009    SLF: few small AVMs in ascending colon distal to IC . Ablated via BICAP.   Marland Kitchen Esophagogastroduodenoscopy  Sept 2009    SLF: normal esophagus, small HH, 1-2 AVMs actively oozing in mid stomach, s/p BICAP, path benign   . Esophagogastroduodenoscopy  01/18/2012    Rourk-Erosive reflux esophagitis. Noncritical ring. Antral erosions/ small antral ulcer - appeared innocent  (H pylori serrology negative)  .  Colonoscopy  03/09/2012    Procedure: COLONOSCOPY;  Surgeon: West Bali, MD;  Location: [facility];  Service: Endoscopy;  Laterality: N/A;  2:30  . Esophagogastroduodenoscopy  03/09/2012    Procedure: ESOPHAGOGASTRODUODENOSCOPY (EGD);  Surgeon: West Bali, MD;  Location: [facility];  Service: Endoscopy;  Laterality: N/A;    Family History  Problem Relation Age of Onset  . Colon cancer Neg Hx     History  Substance Use Topics  . Smoking status: Former Smoker    Types: Cigarettes    Quit date: 05/07/1969  . Smokeless tobacco: Current User    Types: Snuff  . Alcohol Use: 2.4 oz/week    4 Cans of beer per week     Comment: last drank today 01/12/13      Review of Systems  Respiratory: Positive for shortness of breath.   Cardiovascular: Negative for chest pain and leg swelling.  All other systems reviewed and are negative.    Allergies  Black pepper  Home Medications   Current Outpatient Rx  Name  Route  Sig  Dispense  Refill  . albuterol (PROVENTIL HFA;VENTOLIN HFA) 108 (90 BASE) MCG/ACT inhaler   Inhalation   Inhale 2 puffs into the lungs every 4 (four) hours as needed for wheezing.   1 Inhaler   0   . Ipratropium-Albuterol (COMBIVENT RESPIMAT) 20-100 MCG/ACT AERS respimat  Inhalation   Inhale 1 puff into the lungs every 6 (six) hours as needed for wheezing or shortness of breath.         . predniSONE (DELTASONE) 20 MG tablet   Oral   Take 40 mg by mouth daily.           Triage Vitals: BP 111/69  Pulse 92  Temp(Src) 97.9 F (36.6 C) (Oral)  Resp 22  Ht 5\' 5"  (1.651 m)  Wt 120 lb (54.432 kg)  BMI 19.97 kg/m2  SpO2 93%  Physical Exam  Constitutional: He is oriented to person, place, and time. He appears well-developed and well-nourished. No distress.  HENT:  Head: Normocephalic and atraumatic.  Right Ear: Hearing normal.  Nose: Nose normal.  Mouth/Throat: Oropharynx is clear and moist and mucous membranes are normal.  Eyes:  Conjunctivae and EOM are normal. Pupils are equal, round, and reactive to light.  Neck: Normal range of motion. Neck supple.  Cardiovascular: Normal rate, regular rhythm, S1 normal and S2 normal.  Exam reveals no gallop and no friction rub.   No murmur heard. Pulmonary/Chest: Effort normal. No respiratory distress. He has wheezes. He exhibits no tenderness.  Abdominal: Soft. Normal appearance and bowel sounds are normal. There is no hepatosplenomegaly. There is no tenderness. There is no rebound, no guarding, no tenderness at McBurney's point and negative Murphy's sign. No hernia.  Musculoskeletal: Normal range of motion.  Neurological: He is alert and oriented to person, place, and time. He has normal strength. No cranial nerve deficit or sensory deficit. Coordination normal. GCS eye subscore is 4. GCS verbal subscore is 5. GCS motor subscore is 6.  Skin: Skin is warm, dry and intact. No rash noted. No cyanosis.  Psychiatric: He has a normal mood and affect. His speech is normal and behavior is normal. Thought content normal.    ED Course  Procedures (including critical care time) DIAGNOSTIC STUDIES: Oxygen Saturation is 93% on room air, adequate by my interpretation.    COORDINATION OF CARE: 5:51 PM- Patient informed of current plan for treatment and evaluation and agrees with plan at this time.    Labs Reviewed - No data to display No results found.   Diagnosis: COPD    MDM  Patient well-known to the emergency department here. He has a history of COPD that is poorly controlled. Patient presents to the ER with complaints of shortness of breath examination revealed wheezing. As is often the case, patient became angry and belligerent immediately upon being placed in the room. He started to complain that things were taking too long and threatened to leave. He was convinced to stay long enough to get a breathing treatment and then immediately proceeded to storm out of the emergency  department before any further treatment or testing to be performed. He was asked to stay long enough to be given an inhaler to help if he has further trouble, but refuses to stay.    I personally performed the services described in this documentation, which was scribed in my presence. The recorded information has been reviewed and is accurate.     Gilda Crease, MD 01/17/13 2239

## 2013-01-17 NOTE — ED Notes (Signed)
Sob x 1 hour . " I need a breathing tx"

## 2013-01-20 ENCOUNTER — Encounter (HOSPITAL_COMMUNITY): Payer: Self-pay | Admitting: *Deleted

## 2013-01-20 ENCOUNTER — Emergency Department (HOSPITAL_COMMUNITY)
Admission: EM | Admit: 2013-01-20 | Discharge: 2013-01-20 | Disposition: A | Discharge status: Home / Self Care | Payer: PRIVATE HEALTH INSURANCE | Attending: Emergency Medicine | Admitting: Emergency Medicine

## 2013-01-20 ENCOUNTER — Encounter (HOSPITAL_COMMUNITY): Payer: Self-pay

## 2013-01-20 ENCOUNTER — Emergency Department (HOSPITAL_COMMUNITY): Payer: PRIVATE HEALTH INSURANCE

## 2013-01-20 DIAGNOSIS — Z8709 Personal history of other diseases of the respiratory system: Secondary | ICD-10-CM | POA: Insufficient documentation

## 2013-01-20 DIAGNOSIS — Z79899 Other long term (current) drug therapy: Secondary | ICD-10-CM | POA: Insufficient documentation

## 2013-01-20 DIAGNOSIS — Z8659 Personal history of other mental and behavioral disorders: Secondary | ICD-10-CM | POA: Insufficient documentation

## 2013-01-20 DIAGNOSIS — I10 Essential (primary) hypertension: Secondary | ICD-10-CM | POA: Insufficient documentation

## 2013-01-20 DIAGNOSIS — F411 Generalized anxiety disorder: Secondary | ICD-10-CM | POA: Insufficient documentation

## 2013-01-20 DIAGNOSIS — J441 Chronic obstructive pulmonary disease with (acute) exacerbation: Secondary | ICD-10-CM | POA: Insufficient documentation

## 2013-01-20 DIAGNOSIS — J449 Chronic obstructive pulmonary disease, unspecified: Secondary | ICD-10-CM

## 2013-01-20 DIAGNOSIS — Z87891 Personal history of nicotine dependence: Secondary | ICD-10-CM | POA: Insufficient documentation

## 2013-01-20 DIAGNOSIS — J440 Chronic obstructive pulmonary disease with acute lower respiratory infection: Secondary | ICD-10-CM | POA: Insufficient documentation

## 2013-01-20 DIAGNOSIS — Z862 Personal history of diseases of the blood and blood-forming organs and certain disorders involving the immune mechanism: Secondary | ICD-10-CM | POA: Insufficient documentation

## 2013-01-20 DIAGNOSIS — Z8719 Personal history of other diseases of the digestive system: Secondary | ICD-10-CM | POA: Insufficient documentation

## 2013-01-20 DIAGNOSIS — IMO0002 Reserved for concepts with insufficient information to code with codable children: Secondary | ICD-10-CM | POA: Insufficient documentation

## 2013-01-20 DIAGNOSIS — J209 Acute bronchitis, unspecified: Secondary | ICD-10-CM | POA: Insufficient documentation

## 2013-01-20 LAB — CBC
MCHC: 32.4 g/dL (ref 30.0–36.0)
Platelets: 352 10*3/uL (ref 150–400)
RDW: 19.2 % — ABNORMAL HIGH (ref 11.5–15.5)
WBC: 5.8 10*3/uL (ref 4.0–10.5)

## 2013-01-20 LAB — BASIC METABOLIC PANEL
BUN: 8 mg/dL (ref 6–23)
Chloride: 100 mEq/L (ref 96–112)
GFR calc Af Amer: >90 mL/min (ref >90)
GFR calc non Af Amer: 83 mL/min — ABNORMAL LOW (ref >90)
Potassium: 3.6 mEq/L (ref 3.5–5.1)
Sodium: 136 mEq/L (ref 135–145)

## 2013-01-20 LAB — ETHANOL: Alcohol, Ethyl (B): 58 mg/dL — ABNORMAL HIGH (ref 0–11)

## 2013-01-20 MED ORDER — ALBUTEROL SULFATE (5 MG/ML) 0.5% IN NEBU
5.0000 mg | INHALATION_SOLUTION | Freq: Once | RESPIRATORY_TRACT | Status: AC
  Administered 2013-01-20: 5 mg via RESPIRATORY_TRACT
  Filled 2013-01-20: qty 1

## 2013-01-20 MED ORDER — PREDNISONE 20 MG PO TABS: 60.0000 mg | ORAL_TABLET | Freq: Every day | ORAL | Status: DC

## 2013-01-20 MED ORDER — PREDNISONE 50 MG PO TABS
60.0000 mg | ORAL_TABLET | Freq: Once | ORAL | Status: AC
  Administered 2013-01-20: 60 mg via ORAL
  Filled 2013-01-20: qty 1

## 2013-01-20 MED ORDER — IPRATROPIUM BROMIDE 0.02 % IN SOLN
0.5000 mg | Freq: Once | RESPIRATORY_TRACT | Status: AC
  Administered 2013-01-20: 0.5 mg via RESPIRATORY_TRACT
  Filled 2013-01-20: qty 2.5

## 2013-01-20 MED ORDER — ALBUTEROL SULFATE HFA 108 (90 BASE) MCG/ACT IN AERS: 1.0000 | INHALATION_SPRAY | Freq: Four times a day (QID) | RESPIRATORY_TRACT | Status: DC | PRN

## 2013-01-20 NOTE — ED Notes (Signed)
Pt here for shortness of breath all of a sudden around an hour ago.

## 2013-01-20 NOTE — ED Notes (Signed)
Pt c/o SOB x 1 hour.  Says feels like something is "stopping my throat up."

## 2013-01-20 NOTE — ED Provider Notes (Signed)
History     CSN: [id number]  Arrival date & time 01/20/13  0031   First MD Initiated Contact with Patient 01/20/13 [phone]      Chief Complaint  Patient presents with  . Shortness of Breath    (Consider location/radiation/quality/duration/timing/severity/associated sxs/prior treatment) HPI History provided by patient. Difficulty breathing tonight with wheezing and history of COPD. Patient called 911. He was having some difficulty breathing earlier felt like he needed to belch, so he drank beer.  He denies any chest pain, nausea or diaphoresis. Symptoms moderate in severity. Patient states he has inhaler but is unable to locate it at this time - he denies any recent tobacco use, states he does not smoke tobacco and over 20 years.  He admits to regular alcohol use.  Symptoms worse with any walking or exertion. No associated leg pain or leg swelling.  Past Medical History  Diagnosis Date  . COPD (chronic obstructive pulmonary disease)   . Anxiety   . Alcohol abuse   . Anemia   . Generalized headaches   . Hypertension   . 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 Pulmonary nodule/lesion, solitary   . Schizoaffective disorder   . GI bleed     History of recurrent bleeding that dates back as far as 2004 per records,    Past Surgical History  Procedure Laterality Date  . Colonoscopy  Sept 2009    SLF: few small AVMs in ascending colon distal to IC . Ablated via BICAP.   Marland Kitchen Esophagogastroduodenoscopy  Sept 2009    SLF: normal esophagus, small HH, 1-2 AVMs actively oozing in mid stomach, s/p BICAP, path benign   . Esophagogastroduodenoscopy  01/18/2012    Rourk-Erosive reflux esophagitis. Noncritical ring. Antral erosions/ small antral ulcer - appeared innocent  (H pylori serrology negative)  . Colonoscopy  03/09/2012    Procedure: COLONOSCOPY;  Surgeon: West Bali, MD;  Location: [facility];  Service: Endoscopy;  Laterality:  N/A;  2:30  . Esophagogastroduodenoscopy  03/09/2012    Procedure: ESOPHAGOGASTRODUODENOSCOPY (EGD);  Surgeon: West Bali, MD;  Location: [facility];  Service: Endoscopy;  Laterality: N/A;    Family History  Problem Relation Age of Onset  . Colon cancer Neg Hx     History  Substance Use Topics  . Smoking status: Former Smoker    Types: Cigarettes    Quit date: 05/07/1969  . Smokeless tobacco: Current User    Types: Snuff  . Alcohol Use: 2.4 oz/week    4 Cans of beer per week     Comment: last drank today 01/12/13      Review of Systems  Constitutional: Negative for fever and chills.  HENT: Negative for neck pain.   Eyes: Negative for visual disturbance.  Respiratory: Positive for shortness of breath and wheezing.   Cardiovascular: Negative for chest pain and leg swelling.  Gastrointestinal: Negative for abdominal pain.  Genitourinary: Negative for flank pain.  Musculoskeletal: Negative for back pain.  Skin: Negative for rash.  Neurological: Negative for headaches.  All other systems reviewed and are negative.    Allergies  Black pepper  Home Medications   Current Outpatient Rx  Name  Route  Sig  Dispense  Refill  . albuterol (PROVENTIL HFA;VENTOLIN HFA) 108 (90 BASE) MCG/ACT inhaler   Inhalation   Inhale 2 puffs into the lungs every 4 (four) hours as needed for wheezing.  1 Inhaler   0   . Ipratropium-Albuterol (COMBIVENT RESPIMAT) 20-100 MCG/ACT AERS respimat   Inhalation   Inhale 1 puff into the lungs every 6 (six) hours as needed for wheezing or shortness of breath.         . predniSONE (DELTASONE) 20 MG tablet   Oral   Take 40 mg by mouth daily.           BP 128/79  Pulse 109  Temp(Src) 97.9 F (36.6 C)  Resp 22  SpO2 94%  Physical Exam  Constitutional: He is oriented to person, place, and time. He appears well-developed and well-nourished.  HENT:  Head: Normocephalic and atraumatic.  Eyes: EOM are normal. Pupils are equal, round,  and reactive to light. No scleral icterus.  Neck: Neck supple.  Cardiovascular: Normal rate, regular rhythm and intact distal pulses.   Pulmonary/Chest: Effort normal. No stridor. No respiratory distress.  Bilateral expiratory wheezes without accessory muscle use  Abdominal: He exhibits no distension. There is no tenderness.  Musculoskeletal: Normal range of motion. He exhibits no edema.  Neurological: He is alert and oriented to person, place, and time.  Skin: Skin is warm and dry.    ED Course  Procedures (including critical care time)  Labs Reviewed  ETHANOL - Abnormal; Notable for the following:    Alcohol, Ethyl (B) 58 (*)    All other components within normal limits  CBC - Abnormal; Notable for the following:    RBC 3.59 (*)    Hemoglobin 10.2 (*)    HCT 31.5 (*)    RDW 19.2 (*)    All other components within normal limits  BASIC METABOLIC PANEL - Abnormal; Notable for the following:    GFR calc non Af Amer 83 (*)    All other components within normal limits   Dg Chest Portable 1 View  01/20/2013  *RADIOLOGY REPORT*  Clinical Data: Shortness of breath.  PORTABLE CHEST - 1 VIEW  Comparison: 01/08/2013 and prior chest radiographs  Findings: The cardiomediastinal silhouette is unremarkable. Mild lingular scarring is again noted. There is no evidence of focal airspace disease, pulmonary edema, suspicious pulmonary nodule/mass, pleural effusion, or pneumothorax. No acute bony abnormalities are identified.  IMPRESSION: No evidence of acute cardiopulmonary disease.   Original Report Authenticated By: Harmon Pier, M.D.       Date: 01/20/2013  Rate: 96  Rhythm: normal sinus rhythm  QRS Axis: normal  Intervals: normal  ST/T Wave abnormalities: nonspecific ST changes  Conduction Disutrbances:none  Narrative Interpretation:   Old EKG Reviewed: unchanged  Albuterol and prednisone provided. On recheck is improving but still has some expiratory wheezes in bilateral lung fields.  Repeat albuterol.    3:05 AM feels much better with wheezes resolve on exam. Plan discharge home with prescription for prednisone and albuterol with primary care followup. Patient agrees to strict return precautions.  MDM  COPD exacerbation and improved with medications as above. Evaluated with EKG, chest x-ray labs.  Old records, vital signs and nursing notes reviewed.        Sunnie Nielsen, MD 01/20/13 [phone]

## 2013-01-20 NOTE — ED Notes (Signed)
Pt given urinal.

## 2013-01-20 NOTE — ED Notes (Signed)
Pt receiving breathing treatment at this time. States SOB off and on for months. States "I just need a breathing treatment"

## 2013-01-20 NOTE — ED Notes (Signed)
RT called for breathing treatment.

## 2013-01-20 NOTE — Progress Notes (Signed)
Pt using some accessory muscles to breath with an grunt on exhalation.

## 2013-01-20 NOTE — ED Provider Notes (Signed)
History     CSN: [id number]  Arrival date & time 01/20/13  1735   First MD Initiated Contact with Patient 01/20/13 1742      Chief Complaint  Patient presents with  . Shortness of Breath     HPI Pt was seen at 1755.   Per pt, c/o gradual onset and persistence of constant wheezing and SOB for the past 1 hour.  States he came to the ED "for a breathing treatment." Unclear if he tried to use his MDI before coming to the ED. Denies CP/palpitations, no back pain, no abd pain, no N/V/D, no fevers, no rash. The symptoms have been associated with no other complaints. The patient has a significant history of similar symptoms previously, recently being evaluated for this complaint and multiple prior evals for same.       Past Medical History  Diagnosis Date  . COPD (chronic obstructive pulmonary disease)   . Anxiety   . Alcohol abuse   . Anemia   . Generalized headaches   . Hypertension   . 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 Pulmonary nodule/lesion, solitary   . Schizoaffective disorder   . GI bleed     History of recurrent bleeding that dates back as far as 2004 per records,    Past Surgical History  Procedure Laterality Date  . Colonoscopy  Sept 2009    SLF: few small AVMs in ascending colon distal to IC . Ablated via BICAP.   Marland Kitchen Esophagogastroduodenoscopy  Sept 2009    SLF: normal esophagus, small HH, 1-2 AVMs actively oozing in mid stomach, s/p BICAP, path benign   . Esophagogastroduodenoscopy  01/18/2012    Rourk-Erosive reflux esophagitis. Noncritical ring. Antral erosions/ small antral ulcer - appeared innocent  (H pylori serrology negative)  . Colonoscopy  03/09/2012    Procedure: COLONOSCOPY;  Surgeon: West Bali, MD;  Location: [facility];  Service: Endoscopy;  Laterality: N/A;  2:30  . Esophagogastroduodenoscopy  03/09/2012    Procedure: ESOPHAGOGASTRODUODENOSCOPY (EGD);  Surgeon: West Bali, MD;   Location: [facility];  Service: Endoscopy;  Laterality: N/A;    Family History  Problem Relation Age of Onset  . Colon cancer Neg Hx     History  Substance Use Topics  . Smoking status: Former Smoker    Types: Cigarettes    Quit date: 05/07/1969  . Smokeless tobacco: Current User    Types: Snuff  . Alcohol Use: 2.4 oz/week    4 Cans of beer per week     Comment: last drank today 01/12/13      Review of Systems ROS: Statement: All systems negative except as marked or noted in the HPI; Constitutional: Negative for fever and chills. ; ; Eyes: Negative for eye pain, redness and discharge. ; ; ENMT: Negative for ear pain, hoarseness, nasal congestion, sinus pressure and sore throat. ; ; Cardiovascular: Negative for chest pain, palpitations, diaphoresis, and peripheral edema. ; ; Respiratory: +SOB, wheezing. Negative for cough and stridor. ; ; Gastrointestinal: Negative for nausea, vomiting, diarrhea, abdominal pain, blood in stool, hematemesis, jaundice and rectal bleeding. . ; ; Genitourinary: Negative for dysuria, flank pain and hematuria. ; ; Musculoskeletal: Negative for back pain and neck pain. Negative for swelling and trauma.; ; Skin: Negative for pruritus, rash, abrasions, blisters, bruising and skin lesion.; ; Neuro: Negative for headache, lightheadedness and neck stiffness. Negative for weakness, altered  level of consciousness , altered mental status, extremity weakness, paresthesias, involuntary movement, seizure and syncope.       Allergies  Black pepper  Home Medications   Current Outpatient Rx  Name  Route  Sig  Dispense  Refill  . albuterol (PROVENTIL HFA;VENTOLIN HFA) 108 (90 BASE) MCG/ACT inhaler   Inhalation   Inhale 1-2 puffs into the lungs every 6 (six) hours as needed for wheezing.   1 Inhaler   0   . diazepam (VALIUM) 5 MG tablet   Oral   Take 5 mg by mouth 3 (three) times daily.         Marland Kitchen HYDROcodone-acetaminophen (NORCO/VICODIN) 5-325 MG per  tablet   Oral   Take 1 tablet by mouth 4 (four) times daily.         . Ipratropium-Albuterol (COMBIVENT RESPIMAT) 20-100 MCG/ACT AERS respimat   Inhalation   Inhale 1 puff into the lungs every 6 (six) hours as needed for wheezing or shortness of breath.         . Menthol (HALLS COUGH DROPS) 5.8 MG LOZG   Mouth/Throat   Use as directed 1 lozenge in the mouth or throat 3 (three) times daily as needed (for cough and sore throat).         . predniSONE (DELTASONE) 20 MG tablet   Oral   Take 40 mg by mouth daily.         . predniSONE (DELTASONE) 20 MG tablet   Oral   Take 3 tablets (60 mg total) by mouth daily.   15 tablet   0     BP 140/80  Pulse 101  Temp(Src) 97.5 F (36.4 C) (Oral)  Resp 24  Wt 113 lb 14.4 oz (51.665 kg)  BMI 18.95 kg/m2  SpO2 95%  Physical Exam 1800: Physical examination:  Nursing notes reviewed; Vital signs and O2 SAT reviewed;  Constitutional: Well developed, Well nourished, Well hydrated, In no acute distress; Head:  Normocephalic, atraumatic; Eyes: EOMI, PERRL, No scleral icterus; ENMT: Mouth and pharynx normal, Mucous membranes moist; Neck: Supple, Full range of motion, No lymphadenopathy; Cardiovascular: Regular rate and rhythm, No gallop; Respiratory: Breath sounds diminished & equal bilaterally, No wheezes.  Speaking full sentences with ease, yelling loudly at ED staff.  Normal respiratory effort/excursion; Chest: Nontender, Movement normal; Abdomen: Soft, Nontender, Nondistended, Normal bowel sounds; Genitourinary: No CVA tenderness; Extremities: Pulses normal, No tenderness, No edema, No calf edema or asymmetry.; Neuro: AA&Ox3, Major CN grossly intact.  Speech clear. Gait steady.  Climbing on and off the stretcher by himself easily, walking around the ED.  No gross focal motor or sensory deficits in extremities.; Skin: Color normal, Warm, Dry.   ED Course  Procedures    MDM  MDM Reviewed: previous chart, nursing note and vitals Reviewed  previous: labs, ECG and x-ray   Results for orders placed during the hospital encounter of 01/20/13  ETHANOL      Result Value Range   Alcohol, Ethyl (B) 58 (*) 0 - 11 mg/dL  CBC      Result Value Range   WBC 5.8  4.0 - 10.5 K/uL   RBC 3.59 (*) 4.22 - 5.81 MIL/uL   Hemoglobin 10.2 (*) 13.0 - 17.0 g/dL   HCT 16.1 (*) 09.6 - 04.5 %   MCV 87.7  78.0 - 100.0 fL   MCH 28.4  26.0 - 34.0 pg   MCHC 32.4  30.0 - 36.0 g/dL   RDW 40.9 (*) 81.1 - 91.4 %  Platelets 352  150 - 400 K/uL  BASIC METABOLIC PANEL      Result Value Range   Sodium 136  135 - 145 mEq/L   Potassium 3.6  3.5 - 5.1 mEq/L   Chloride 100  96 - 112 mEq/L   CO2 23  19 - 32 mEq/L   Glucose, Bld 99  70 - 99 mg/dL   BUN 8  6 - 23 mg/dL   Creatinine, Ser 1.61  0.50 - 1.35 mg/dL   Calcium 8.9  8.4 - 09.6 mg/dL   GFR calc non Af Amer 83 (*) >90 mL/min   GFR calc Af Amer >90  >90 mL/min    Dg Chest Portable 1 View 01/20/2013  *RADIOLOGY REPORT*  Clinical Data: Shortness of breath.  PORTABLE CHEST - 1 VIEW  Comparison: 01/08/2013 and prior chest radiographs  Findings: The cardiomediastinal silhouette is unremarkable. Mild lingular scarring is again noted. There is no evidence of focal airspace disease, pulmonary edema, suspicious pulmonary nodule/mass, pleural effusion, or pneumothorax. No acute bony abnormalities are identified.  IMPRESSION: No evidence of acute cardiopulmonary disease.   Original Report Authenticated By: Harmon Pier, M.D.      1930:  Pt with 69 ED visits in the past 6 months for the same symptoms.  Most recent ED visit was early this morning; with workup as above.  Pt completed neb then told staff he was "leaving now" and to "give me my stuff back."  Told ED RN that he wasn't going to wait for his "papers" and left the ED.           Laray Anger, DO 01/21/13 1344

## 2013-01-22 ENCOUNTER — Encounter (HOSPITAL_COMMUNITY): Payer: Self-pay | Admitting: *Deleted

## 2013-01-22 ENCOUNTER — Emergency Department (HOSPITAL_COMMUNITY)
Admission: EM | Admit: 2013-01-22 | Discharge: 2013-01-22 | Disposition: A | Discharge status: Home / Self Care | Payer: PRIVATE HEALTH INSURANCE | Source: Home / Self Care | Attending: Emergency Medicine | Admitting: Emergency Medicine

## 2013-01-22 ENCOUNTER — Emergency Department (HOSPITAL_COMMUNITY)
Admission: EM | Admit: 2013-01-22 | Discharge: 2013-01-22 | Disposition: A | Discharge status: Home / Self Care | Payer: PRIVATE HEALTH INSURANCE | Attending: Emergency Medicine | Admitting: Emergency Medicine

## 2013-01-22 DIAGNOSIS — J441 Chronic obstructive pulmonary disease with (acute) exacerbation: Secondary | ICD-10-CM | POA: Insufficient documentation

## 2013-01-22 DIAGNOSIS — F411 Generalized anxiety disorder: Secondary | ICD-10-CM | POA: Insufficient documentation

## 2013-01-22 DIAGNOSIS — I10 Essential (primary) hypertension: Secondary | ICD-10-CM | POA: Insufficient documentation

## 2013-01-22 DIAGNOSIS — Z87891 Personal history of nicotine dependence: Secondary | ICD-10-CM | POA: Insufficient documentation

## 2013-01-22 DIAGNOSIS — Z87738 Personal history of other specified (corrected) congenital malformations of digestive system: Secondary | ICD-10-CM | POA: Insufficient documentation

## 2013-01-22 DIAGNOSIS — J449 Chronic obstructive pulmonary disease, unspecified: Secondary | ICD-10-CM

## 2013-01-22 DIAGNOSIS — F1010 Alcohol abuse, uncomplicated: Secondary | ICD-10-CM | POA: Insufficient documentation

## 2013-01-22 DIAGNOSIS — Z8709 Personal history of other diseases of the respiratory system: Secondary | ICD-10-CM | POA: Insufficient documentation

## 2013-01-22 DIAGNOSIS — IMO0002 Reserved for concepts with insufficient information to code with codable children: Secondary | ICD-10-CM | POA: Insufficient documentation

## 2013-01-22 DIAGNOSIS — Z862 Personal history of diseases of the blood and blood-forming organs and certain disorders involving the immune mechanism: Secondary | ICD-10-CM | POA: Insufficient documentation

## 2013-01-22 DIAGNOSIS — J40 Bronchitis, not specified as acute or chronic: Secondary | ICD-10-CM

## 2013-01-22 DIAGNOSIS — Z8719 Personal history of other diseases of the digestive system: Secondary | ICD-10-CM | POA: Insufficient documentation

## 2013-01-22 DIAGNOSIS — F259 Schizoaffective disorder, unspecified: Secondary | ICD-10-CM

## 2013-01-22 DIAGNOSIS — Z79899 Other long term (current) drug therapy: Secondary | ICD-10-CM | POA: Insufficient documentation

## 2013-01-22 DIAGNOSIS — Z8774 Personal history of (corrected) congenital malformations of heart and circulatory system: Secondary | ICD-10-CM | POA: Insufficient documentation

## 2013-01-22 DIAGNOSIS — Z8659 Personal history of other mental and behavioral disorders: Secondary | ICD-10-CM | POA: Insufficient documentation

## 2013-01-22 LAB — BLOOD GAS, ARTERIAL
Bicarbonate: 35 mEq/L — ABNORMAL HIGH (ref 20.0–24.0)
FIO2: 21 %
O2 Saturation: 88.2 %
Patient temperature: 37
pH, Arterial: 7.506 — ABNORMAL HIGH (ref 7.350–7.450)

## 2013-01-22 MED ORDER — PREDNISONE 50 MG PO TABS
60.0000 mg | ORAL_TABLET | Freq: Once | ORAL | Status: AC
  Administered 2013-01-22: 60 mg via ORAL
  Filled 2013-01-22: qty 1

## 2013-01-22 MED ORDER — ALBUTEROL SULFATE (5 MG/ML) 0.5% IN NEBU
2.5000 mg | INHALATION_SOLUTION | Freq: Once | RESPIRATORY_TRACT | Status: AC
  Administered 2013-01-22: 2.5 mg via RESPIRATORY_TRACT
  Filled 2013-01-22: qty 0.5

## 2013-01-22 MED ORDER — IPRATROPIUM BROMIDE 0.02 % IN SOLN
0.5000 mg | Freq: Once | RESPIRATORY_TRACT | Status: AC
  Administered 2013-01-22: 0.5 mg via RESPIRATORY_TRACT
  Filled 2013-01-22: qty 2.5

## 2013-01-22 MED ORDER — ALBUTEROL SULFATE (5 MG/ML) 0.5% IN NEBU
5.0000 mg | INHALATION_SOLUTION | Freq: Once | RESPIRATORY_TRACT | Status: AC
  Administered 2013-01-22: 5 mg via RESPIRATORY_TRACT
  Filled 2013-01-22: qty 1

## 2013-01-22 NOTE — ED Notes (Signed)
Pt brought in by EMs, examined by Dr Effie Shy prior to triage.   Alert, Says sob, and points to suprasternal notch as site of pain.  NAD, alert,

## 2013-01-22 NOTE — ED Provider Notes (Signed)
History     CSN: [id number]  Arrival date & time 01/22/13  1858   First MD Initiated Contact with Patient 01/22/13 1917      Chief Complaint  Patient presents with  . Shortness of Breath    (Consider location/radiation/quality/duration/timing/severity/associated sxs/prior treatment) HPI Comments: [name] is a 79 y.o. Male here for shortness of breath. He was here just a few hours ago with the same complaints. He states his sleep. He has used his albuterol inhaler, at home. He is getting short of breath with ambulation. He is unable to specify any further complaints, treatments, plans for followup care or as any other concerns.  level 5 caveat- poor historian  Patient is a 79 y.o. male presenting with shortness of breath. The history is provided by the patient.  Shortness of Breath   Past Medical History  Diagnosis Date  . COPD (chronic obstructive pulmonary disease)   . Anxiety   . Alcohol abuse   . Anemia   . Generalized headaches   . Hypertension   . 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 Pulmonary nodule/lesion, solitary   . Schizoaffective disorder   . GI bleed     History of recurrent bleeding that dates back as far as 2004 per records,    Past Surgical History  Procedure Laterality Date  . Colonoscopy  Sept 2009    SLF: few small AVMs in ascending colon distal to IC . Ablated via BICAP.   Marland Kitchen Esophagogastroduodenoscopy  Sept 2009    SLF: normal esophagus, small HH, 1-2 AVMs actively oozing in mid stomach, s/p BICAP, path benign   . Esophagogastroduodenoscopy  01/18/2012    Rourk-Erosive reflux esophagitis. Noncritical ring. Antral erosions/ small antral ulcer - appeared innocent  (H pylori serrology negative)  . Colonoscopy  03/09/2012    Procedure: COLONOSCOPY;  Surgeon: West Bali, MD;  Location: [facility];  Service: Endoscopy;  Laterality: N/A;  2:30  . Esophagogastroduodenoscopy  03/09/2012    Procedure: ESOPHAGOGASTRODUODENOSCOPY (EGD);  Surgeon: West Bali, MD;  Location: [facility];  Service: Endoscopy;  Laterality: N/A;    Family History  Problem Relation Age of Onset  . Colon cancer Neg Hx     History  Substance Use Topics  . Smoking status: Former Smoker    Types: Cigarettes    Quit date: 05/07/1969  . Smokeless tobacco: Current User    Types: Snuff  . Alcohol Use: 2.4 oz/week    4 Cans of beer per week     Comment: last drank today 01/12/13      Review of Systems  Unable to perform ROS Respiratory: Positive for shortness of breath.     Allergies  Black pepper  Home Medications   Current Outpatient Rx  Name  Route  Sig  Dispense  Refill  . albuterol (PROAIR HFA) 108 (90 BASE) MCG/ACT inhaler   Inhalation   Inhale 2 puffs into the lungs every 6 (six) hours as needed for wheezing or shortness of breath.         Marland Kitchen albuterol (PROVENTIL HFA;VENTOLIN HFA) 108 (90 BASE) MCG/ACT inhaler   Inhalation   Inhale 1-2 puffs into the lungs every 6 (six) hours as needed for wheezing.   1 Inhaler   0   . diazepam (VALIUM) 5 MG tablet   Oral   Take 5 mg by mouth 3 (three) times daily.         Marland Kitchen  HYDROcodone-acetaminophen (NORCO/VICODIN) 5-325 MG per tablet   Oral   Take 1 tablet by mouth 4 (four) times daily.         . Menthol (HALLS COUGH DROPS) 5.8 MG LOZG   Mouth/Throat   Use as directed 1 lozenge in the mouth or throat 3 (three) times daily as needed (for cough and sore throat).         . predniSONE (DELTASONE) 20 MG tablet   Oral   Take 3 tablets (60 mg total) by mouth daily.   15 tablet   0   . Ipratropium-Albuterol (COMBIVENT RESPIMAT) 20-100 MCG/ACT AERS respimat   Inhalation   Inhale 1 puff into the lungs every 6 (six) hours as needed for wheezing or shortness of breath.           BP 130/86  Pulse 86  Temp(Src) 99.1 F (37.3 C) (Oral)  Resp 18  Wt 113 lb (51.256 kg)  BMI 18.8 kg/m2  SpO2 93%  Physical Exam  Nursing  note and vitals reviewed. Constitutional: He is oriented to person, place, and time. He appears well-developed.  Elderly, frail. He is pacing in the room  HENT:  Head: Normocephalic and atraumatic.  Right Ear: External ear normal.  Left Ear: External ear normal.  Eyes: Conjunctivae and EOM are normal. Pupils are equal, round, and reactive to light.  Neck: Normal range of motion and phonation normal. Neck supple.  Cardiovascular: Normal rate, regular rhythm, normal heart sounds and intact distal pulses.   Pulmonary/Chest: Effort normal. No respiratory distress. He exhibits no bony tenderness.  Generalized wheezing and decreased air movement, bilaterally. No rhonchi, or rales  Abdominal: Soft. Normal appearance. There is no tenderness.  Musculoskeletal: Normal range of motion.  Neurological: He is alert and oriented to person, place, and time. He has normal strength. No cranial nerve deficit or sensory deficit. He exhibits normal muscle tone. Coordination normal.  Skin: Skin is warm, dry and intact.  Psychiatric:  He is anxious    ED Course  Procedures (including critical care time)   Medications  albuterol (PROVENTIL) (5 MG/ML) 0.5% nebulizer solution 5 mg (5 mg Nebulization Given 01/22/13 2002)  ipratropium (ATROVENT) nebulizer solution 0.5 mg (0.5 mg Nebulization Given 01/22/13 2002)  predniSONE (DELTASONE) tablet 60 mg (60 mg Oral Given 01/22/13 2106)    Evaluation after nebulizer, shows improvement of his oxygen saturation to 93%, on room air. This is normal.   According to records; he was treated with a prednisone prescription, 2 days ago. The patient cannot specify whether he is taking it. He'll be given a dose of prednisone in the ED.  Nursing Notes Reviewed/ Care Coordinated Applicable Imaging Reviewed Interpretation of Laboratory Data incorporated into ED treatment  Labs Reviewed  BLOOD GAS, ARTERIAL - Abnormal; Notable for the following:    pH, Arterial 7.506 (*)     pO2, Arterial 54.4 (*)    Bicarbonate 35.0 (*)    Acid-Base Excess 11.1 (*)    All other components within normal limits      1. Bronchitis       MDM  Recurrent dyspnea with bronchitis. His oxygen saturation is borderline low. His arterial blood gas indicates PO2 of 54. This is nearing level, where he may need oxygen treatments. Certainly, his pulmonary disorder is progressing. I think it is unlikely that he will be able to tolerate using oxygen. He does not meet criteria for use of oxygen, at this time. He is stable for discharged with usual treatment,  prednisone, and albuterol inhaler. He was also given an albuterol inhaler 2 days ago. He has received numerous albuterol inhalers from the ED.    Plan: Home Medications- usual; Home Treatments- Rest; Recommended follow up- PCP for a check up in 1-2 days          Flint Melter, MD 01/24/13 [phone]

## 2013-01-22 NOTE — ED Provider Notes (Signed)
History  This chart was scribed for Flint Melter, MD by Bennett Scrape, ED Scribe. This patient was seen in room [facility] and the patient's care was started at 1:54 PM.  CSN: [id number]  Arrival date & time 01/22/13  1351   None     Chief Complaint  Patient presents with  . Shortness of Breath    The history is provided by the patient. No language interpreter was used.    [name] is a 79 y.o. male brought in by ambulance with a h/o COPD, who presents to the Emergency Department complaining of SOB. He denies any EtOH consumption today. He is requesting "nerve pills" and an inhaler. He has no other complaints.  Pt with 67 ED visits in the past 6 months for the same symptoms. His most recent ED visit was yesterday. He completed the nebulizer treatment and then told staff he wasn't going to wait for his "papers" and left the ED.    Past Medical History  Diagnosis Date  . COPD (chronic obstructive pulmonary disease)   . Anxiety   . Alcohol abuse   . Anemia   . Generalized headaches   . Hypertension   . 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 Pulmonary nodule/lesion, solitary   . Schizoaffective disorder   . GI bleed     History of recurrent bleeding that dates back as far as 2004 per records,    Past Surgical History  Procedure Laterality Date  . Colonoscopy  Sept 2009    SLF: few small AVMs in ascending colon distal to IC . Ablated via BICAP.   Marland Kitchen Esophagogastroduodenoscopy  Sept 2009    SLF: normal esophagus, small HH, 1-2 AVMs actively oozing in mid stomach, s/p BICAP, path benign   . Esophagogastroduodenoscopy  01/18/2012    Rourk-Erosive reflux esophagitis. Noncritical ring. Antral erosions/ small antral ulcer - appeared innocent  (H pylori serrology negative)  . Colonoscopy  03/09/2012    Procedure: COLONOSCOPY;  Surgeon: West Bali, MD;  Location: [facility];  Service: Endoscopy;  Laterality:  N/A;  2:30  . Esophagogastroduodenoscopy  03/09/2012    Procedure: ESOPHAGOGASTRODUODENOSCOPY (EGD);  Surgeon: West Bali, MD;  Location: [facility];  Service: Endoscopy;  Laterality: N/A;    Family History  Problem Relation Age of Onset  . Colon cancer Neg Hx     History  Substance Use Topics  . Smoking status: Former Smoker    Types: Cigarettes    Quit date: 05/07/1969  . Smokeless tobacco: Current User    Types: Snuff  . Alcohol Use: 2.4 oz/week    4 Cans of beer per week     Comment: last drank today 01/12/13      Review of Systems  Constitutional: Negative for fever.  Respiratory: Positive for shortness of breath. Negative for wheezing.   Cardiovascular: Negative for chest pain.  All other systems reviewed and are negative.    Allergies  Black pepper  Home Medications   Current Outpatient Rx  Name  Route  Sig  Dispense  Refill  . albuterol (PROAIR HFA) 108 (90 BASE) MCG/ACT inhaler   Inhalation   Inhale 2 puffs into the lungs every 6 (six) hours as needed for wheezing or shortness of breath.         Marland Kitchen albuterol (PROVENTIL HFA;VENTOLIN HFA) 108 (90 BASE) MCG/ACT inhaler   Inhalation   Inhale  1-2 puffs into the lungs every 6 (six) hours as needed for wheezing.   1 Inhaler   0   . diazepam (VALIUM) 5 MG tablet   Oral   Take 5 mg by mouth 3 (three) times daily.         Marland Kitchen HYDROcodone-acetaminophen (NORCO/VICODIN) 5-325 MG per tablet   Oral   Take 1 tablet by mouth 4 (four) times daily.         . Ipratropium-Albuterol (COMBIVENT RESPIMAT) 20-100 MCG/ACT AERS respimat   Inhalation   Inhale 1 puff into the lungs every 6 (six) hours as needed for wheezing or shortness of breath.         . Menthol (HALLS COUGH DROPS) 5.8 MG LOZG   Mouth/Throat   Use as directed 1 lozenge in the mouth or throat 3 (three) times daily as needed (for cough and sore throat).         . predniSONE (DELTASONE) 20 MG tablet   Oral   Take 3 tablets (60 mg total) by  mouth daily.   15 tablet   0     Triage Vitals: BP 117/67  Pulse 74  Temp(Src) 97.7 F (36.5 C) (Oral)  SpO2 90%  Physical Exam  Nursing note and vitals reviewed. Constitutional: He is oriented to person, place, and time. He appears well-developed and well-nourished.  HENT:  Head: Normocephalic and atraumatic.  Right Ear: External ear normal.  Left Ear: External ear normal.  Eyes: Conjunctivae and EOM are normal. Pupils are equal, round, and reactive to light.  Neck: Normal range of motion and phonation normal. Neck supple.  Cardiovascular: Normal rate, regular rhythm, normal heart sounds and intact distal pulses.   Pulmonary/Chest: Effort normal and breath sounds normal. He exhibits no bony tenderness.  Abdominal: Soft. Normal appearance. There is no tenderness.  Musculoskeletal: Normal range of motion.  Neurological: He is alert and oriented to person, place, and time. He has normal strength. No cranial nerve deficit or sensory deficit. He exhibits normal muscle tone. Coordination normal.  Skin: Skin is warm, dry and intact.  Psychiatric:  Anxious. Inappropriate statements. Rambling thoughts. These are baseline for this patient.    ED Course  Procedures (including critical care time)  DIAGNOSTIC STUDIES: Oxygen Saturation is 90% on room air, low by my interpretation.    COORDINATION OF CARE: 1:55 PM-Advised pt that he will have to f/u with PCP to have his medications refilled. Discussed treatment plan with pt at bedside and pt agreed to plan.   3:08 PM- Pt's oxy sat dropped to 88%. Will order nebulizer treatment.  3:15 PM- 2.5 mg of 0.5% albuterol nebulizer solution and 0.5 mg of Atrovent solution.   O2 sat= 96% after nebulizer.  Medications  albuterol (PROVENTIL) (5 MG/ML) 0.5% nebulizer solution 2.5 mg (2.5 mg Nebulization Given 01/22/13 1523)  ipratropium (ATROVENT) nebulizer solution 0.5 mg (0.5 mg Nebulization Given 01/22/13 1523)   Nursing Notes Reviewed/ Care  Coordinated, and agree without changes. Applicable Imaging Reviewed Interpretation of Laboratory Data incorporated into ED treatment  1. COPD (chronic obstructive pulmonary disease)   2. Schizoaffective disorder       MDM   Recurrent symptoms. No serious illness. Doubt metabolic instability, serious bacterial infection or impending vascular collapse; the patient is stable for discharge.    Plan: Home Medications- usual; Home Treatments- rest, AVOID ALCOHOL; Recommended follow up- PCP prn    I personally performed the services described in this documentation, which was scribed in my presence. The recorded  information has been reviewed and is accurate.         Flint Melter, MD 01/22/13 1910

## 2013-01-22 NOTE — ED Notes (Signed)
Pt keeps leaving his room and walking around the dept. States his bowels have not moved in two days. Pt given cranberry juice to drink. Pt states he wished he had a permit he would drive to [facility] because they have medicine there that will help his breathing.

## 2013-01-22 NOTE — ED Notes (Signed)
Pt to be discharged. Not in room.

## 2013-01-22 NOTE — ED Notes (Signed)
Sob, and diarrhea,  No vomiting,

## 2013-01-25 ENCOUNTER — Telehealth: Payer: Self-pay | Admitting: *Deleted

## 2013-01-25 ENCOUNTER — Emergency Department (HOSPITAL_COMMUNITY)
Admission: EM | Admit: 2013-01-25 | Discharge: 2013-01-25 | Disposition: A | Discharge status: Home / Self Care | Payer: PRIVATE HEALTH INSURANCE | Attending: Emergency Medicine | Admitting: Emergency Medicine

## 2013-01-25 ENCOUNTER — Encounter (HOSPITAL_COMMUNITY): Payer: Self-pay | Admitting: *Deleted

## 2013-01-25 DIAGNOSIS — Z8669 Personal history of other diseases of the nervous system and sense organs: Secondary | ICD-10-CM | POA: Insufficient documentation

## 2013-01-25 DIAGNOSIS — I10 Essential (primary) hypertension: Secondary | ICD-10-CM | POA: Insufficient documentation

## 2013-01-25 DIAGNOSIS — J441 Chronic obstructive pulmonary disease with (acute) exacerbation: Secondary | ICD-10-CM | POA: Insufficient documentation

## 2013-01-25 DIAGNOSIS — Z87891 Personal history of nicotine dependence: Secondary | ICD-10-CM | POA: Insufficient documentation

## 2013-01-25 DIAGNOSIS — F1010 Alcohol abuse, uncomplicated: Secondary | ICD-10-CM | POA: Insufficient documentation

## 2013-01-25 DIAGNOSIS — Z8774 Personal history of (corrected) congenital malformations of heart and circulatory system: Secondary | ICD-10-CM | POA: Insufficient documentation

## 2013-01-25 DIAGNOSIS — Z8719 Personal history of other diseases of the digestive system: Secondary | ICD-10-CM | POA: Insufficient documentation

## 2013-01-25 DIAGNOSIS — Z862 Personal history of diseases of the blood and blood-forming organs and certain disorders involving the immune mechanism: Secondary | ICD-10-CM | POA: Insufficient documentation

## 2013-01-25 DIAGNOSIS — F411 Generalized anxiety disorder: Secondary | ICD-10-CM | POA: Insufficient documentation

## 2013-01-25 DIAGNOSIS — J449 Chronic obstructive pulmonary disease, unspecified: Secondary | ICD-10-CM

## 2013-01-25 DIAGNOSIS — Z8659 Personal history of other mental and behavioral disorders: Secondary | ICD-10-CM | POA: Insufficient documentation

## 2013-01-25 DIAGNOSIS — Z79899 Other long term (current) drug therapy: Secondary | ICD-10-CM | POA: Insufficient documentation

## 2013-01-25 DIAGNOSIS — Z8709 Personal history of other diseases of the respiratory system: Secondary | ICD-10-CM | POA: Insufficient documentation

## 2013-01-25 MED ORDER — SODIUM CHLORIDE 0.9 % IN NEBU
INHALATION_SOLUTION | RESPIRATORY_TRACT | Status: AC
  Administered 2013-01-25: 3 mL
  Filled 2013-01-25: qty 3

## 2013-01-25 MED ORDER — ALBUTEROL SULFATE (5 MG/ML) 0.5% IN NEBU
5.0000 mg | INHALATION_SOLUTION | Freq: Once | RESPIRATORY_TRACT | Status: AC
  Administered 2013-01-25: 5 mg via RESPIRATORY_TRACT
  Filled 2013-01-25: qty 1

## 2013-01-25 MED ORDER — ALBUTEROL SULFATE (5 MG/ML) 0.5% IN NEBU: 5.0000 mg | INHALATION_SOLUTION | Freq: Once | RESPIRATORY_TRACT | Status: DC

## 2013-01-25 MED ORDER — ALBUTEROL SULFATE HFA 108 (90 BASE) MCG/ACT IN AERS
2.0000 | INHALATION_SPRAY | RESPIRATORY_TRACT | Status: DC | PRN
  Administered 2013-01-25: 2 via RESPIRATORY_TRACT
  Filled 2013-01-25: qty 6.7

## 2013-01-25 NOTE — ED Notes (Signed)
Sob after eating fish and oatmeal cookie per pt. Speaks complete sentences.

## 2013-01-25 NOTE — ED Notes (Signed)
Pt co SOB shortly after "drinking some liquor".

## 2013-01-25 NOTE — ED Provider Notes (Signed)
History     CSN: [id number]  Arrival date & time 01/25/13  0143   First MD Initiated Contact with Patient 01/25/13 0144      Chief Complaint  Patient presents with  . Shortness of Breath  . Alcohol Intoxication    (Consider location/radiation/quality/duration/timing/severity/associated sxs/prior treatment) HPI [name] is a 79 y.o. male  With a h/o COPD, anxiety, alcohol abuse brought in by ambulance, who presents to the Emergency Department complaining of shortness of breath after drinking some beer. He is here because he is wheezing.   Past Medical History  Diagnosis Date  . COPD (chronic obstructive pulmonary disease)   . Anxiety   . Alcohol abuse   . Anemia   . Generalized headaches   . Hypertension   . 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 Pulmonary nodule/lesion, solitary   . Schizoaffective disorder   . GI bleed     History of recurrent bleeding that dates back as far as 2004 per records,    Past Surgical History  Procedure Laterality Date  . Colonoscopy  Sept 2009    SLF: few small AVMs in ascending colon distal to IC . Ablated via BICAP.   Marland Kitchen Esophagogastroduodenoscopy  Sept 2009    SLF: normal esophagus, small HH, 1-2 AVMs actively oozing in mid stomach, s/p BICAP, path benign   . Esophagogastroduodenoscopy  01/18/2012    Rourk-Erosive reflux esophagitis. Noncritical ring. Antral erosions/ small antral ulcer - appeared innocent  (H pylori serrology negative)  . Colonoscopy  03/09/2012    Procedure: COLONOSCOPY;  Surgeon: West Bali, MD;  Location: [facility];  Service: Endoscopy;  Laterality: N/A;  2:30  . Esophagogastroduodenoscopy  03/09/2012    Procedure: ESOPHAGOGASTRODUODENOSCOPY (EGD);  Surgeon: West Bali, MD;  Location: [facility];  Service: Endoscopy;  Laterality: N/A;    Family History  Problem Relation Age of Onset  . Colon cancer Neg Hx     History  Substance Use Topics   . Smoking status: Former Smoker    Types: Cigarettes    Quit date: 05/07/1969  . Smokeless tobacco: Current User    Types: Snuff  . Alcohol Use: 2.4 oz/week    4 Cans of beer per week     Comment: last drank today 01/12/13      Review of Systems  Constitutional: Negative for fever.       10 Systems reviewed and are negative for acute change except as noted in the HPI.  HENT: Negative for congestion.   Eyes: Negative for discharge and redness.  Respiratory: Positive for shortness of breath. Negative for cough.   Cardiovascular: Negative for chest pain.  Gastrointestinal: Negative for vomiting and abdominal pain.  Musculoskeletal: Negative for back pain.  Skin: Negative for rash.  Neurological: Negative for syncope, numbness and headaches.  Psychiatric/Behavioral:       No behavior change.    Allergies  Black pepper  Home Medications   Current Outpatient Rx  Name  Route  Sig  Dispense  Refill  . albuterol (PROAIR HFA) 108 (90 BASE) MCG/ACT inhaler   Inhalation   Inhale 2 puffs into the lungs every 6 (six) hours as needed for wheezing or shortness of breath.         Marland Kitchen albuterol (PROVENTIL HFA;VENTOLIN HFA) 108 (90 BASE) MCG/ACT inhaler   Inhalation   Inhale 1-2 puffs into the lungs every 6 (six)  hours as needed for wheezing.   1 Inhaler   0   . diazepam (VALIUM) 5 MG tablet   Oral   Take 5 mg by mouth 3 (three) times daily.         Marland Kitchen HYDROcodone-acetaminophen (NORCO/VICODIN) 5-325 MG per tablet   Oral   Take 1 tablet by mouth 4 (four) times daily.         . Ipratropium-Albuterol (COMBIVENT RESPIMAT) 20-100 MCG/ACT AERS respimat   Inhalation   Inhale 1 puff into the lungs every 6 (six) hours as needed for wheezing or shortness of breath.         . Menthol (HALLS COUGH DROPS) 5.8 MG LOZG   Mouth/Throat   Use as directed 1 lozenge in the mouth or throat 3 (three) times daily as needed (for cough and sore throat).         . predniSONE (DELTASONE) 20 MG  tablet   Oral   Take 3 tablets (60 mg total) by mouth daily.   15 tablet   0     BP 114/72  Pulse 92  Temp(Src) 98.1 F (36.7 C) (Oral)  Resp 24  SpO2 98%  Physical Exam  Nursing note and vitals reviewed. Constitutional: He appears well-developed and well-nourished.  Awake, alert, nontoxic appearance.  HENT:  Head: Normocephalic and atraumatic.  Eyes: EOM are normal. Pupils are equal, round, and reactive to light. Right eye exhibits no discharge. Left eye exhibits no discharge.  Neck: Neck supple.  Cardiovascular: Normal heart sounds.   Pulmonary/Chest: Effort normal. He exhibits no tenderness.  Abdominal: Soft. There is no tenderness. There is no rebound.  Musculoskeletal: He exhibits no tenderness.  Baseline ROM, no obvious new focal weakness.  Neurological:  Mental status and motor strength appears baseline for patient and situation.  Skin: No rash noted.  Psychiatric: He has a normal mood and affect.    ED Course  Procedures (including critical care time)    204 Patient given one nebulizer treatment of albuterol with relief of the shortness of breath. MDM  Patient with shortness of breath after drinking beer. He has multiple visits to the ER for this c/o. He received an inhaler and prednisone 01/20/13. He was given albuterol nebulizer with relief of shortness of breath. Pt stable in ED with no significant deterioration in condition.The patient appears reasonably screened and/or stabilized for discharge and I doubt any other medical condition or other [facility] requiring further screening, evaluation, or treatment in the ED at this time prior to discharge.  MDM Reviewed: nursing note and vitals           Nicoletta Dress. Colon Branch, MD 01/25/13 1610

## 2013-01-25 NOTE — ED Provider Notes (Signed)
History     CSN: [id number]  Arrival date & time 01/25/13  2045   First MD Initiated Contact with Patient 01/25/13 2108      Chief Complaint  Patient presents with  . Shortness of Breath    (Consider location/radiation/quality/duration/timing/severity/associated sxs/prior treatment) Patient is a 79 y.o. male presenting with shortness of breath. The history is provided by the patient.  Shortness of Breath Associated symptoms: wheezing   Associated symptoms: no chest pain, no cough, no fever and no headaches   pt with hx copd, multiple, multiple ed visits for same, states felt wheezing this evening. States feels same as prior wheezing episodes. States was worried as he is almost out of his inhaler. Denies cough, sore throat, or other uri c/o. No fever or chills. No current or recent cp or discomfort, even w activity or exertion. No orthopnea or pnd. No leg pain or swelling.  Former smoker.  Past Medical History  Diagnosis Date  . COPD (chronic obstructive pulmonary disease)   . Anxiety   . Alcohol abuse   . Anemia   . Generalized headaches   . Hypertension   . 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 Pulmonary nodule/lesion, solitary   . Schizoaffective disorder   . GI bleed     History of recurrent bleeding that dates back as far as 2004 per records,    Past Surgical History  Procedure Laterality Date  . Colonoscopy  Sept 2009    SLF: few small AVMs in ascending colon distal to IC . Ablated via BICAP.   Marland Kitchen Esophagogastroduodenoscopy  Sept 2009    SLF: normal esophagus, small HH, 1-2 AVMs actively oozing in mid stomach, s/p BICAP, path benign   . Esophagogastroduodenoscopy  01/18/2012    Rourk-Erosive reflux esophagitis. Noncritical ring. Antral erosions/ small antral ulcer - appeared innocent  (H pylori serrology negative)  . Colonoscopy  03/09/2012    Procedure: COLONOSCOPY;  Surgeon: West Bali, MD;  Location: [facility];  Service: Endoscopy;  Laterality: N/A;  2:30  . Esophagogastroduodenoscopy  03/09/2012    Procedure: ESOPHAGOGASTRODUODENOSCOPY (EGD);  Surgeon: West Bali, MD;  Location: [facility];  Service: Endoscopy;  Laterality: N/A;    Family History  Problem Relation Age of Onset  . Colon cancer Neg Hx     History  Substance Use Topics  . Smoking status: Former Smoker    Types: Cigarettes    Quit date: 05/07/1969  . Smokeless tobacco: Current User    Types: Snuff  . Alcohol Use: 2.4 oz/week    4 Cans of beer per week     Comment: last drank today 01/12/13      Review of Systems  Constitutional: Negative for fever and chills.  Respiratory: Positive for shortness of breath and wheezing. Negative for cough.   Cardiovascular: Negative for chest pain and leg swelling.  Neurological: Negative for headaches.    Allergies  Black pepper  Home Medications   Current Outpatient Rx  Name  Route  Sig  Dispense  Refill  . albuterol (PROAIR HFA) 108 (90 BASE) MCG/ACT inhaler   Inhalation   Inhale 2 puffs into the lungs every 6 (six) hours as needed for wheezing or shortness of breath.         Marland Kitchen albuterol (PROVENTIL HFA;VENTOLIN HFA) 108 (90 BASE) MCG/ACT inhaler   Inhalation   Inhale 1-2 puffs into the lungs  every 6 (six) hours as needed for wheezing.   1 Inhaler   0   . diazepam (VALIUM) 5 MG tablet   Oral   Take 5 mg by mouth 3 (three) times daily.         Marland Kitchen HYDROcodone-acetaminophen (NORCO/VICODIN) 5-325 MG per tablet   Oral   Take 1 tablet by mouth 4 (four) times daily.         . Ipratropium-Albuterol (COMBIVENT RESPIMAT) 20-100 MCG/ACT AERS respimat   Inhalation   Inhale 1 puff into the lungs every 6 (six) hours as needed for wheezing or shortness of breath.         . Menthol (HALLS COUGH DROPS) 5.8 MG LOZG   Mouth/Throat   Use as directed 1 lozenge in the mouth or throat 3 (three) times daily as needed (for cough and sore throat).         .  predniSONE (DELTASONE) 20 MG tablet   Oral   Take 3 tablets (60 mg total) by mouth daily.   15 tablet   0     BP 128/68  Pulse 95  Temp(Src) 97.9 F (36.6 C) (Oral)  Resp 24  SpO2 93%  Physical Exam  Nursing note and vitals reviewed. Constitutional: He is oriented to person, place, and time. He appears well-developed and well-nourished. No distress.  HENT:  Nose: Nose normal.  Mouth/Throat: Oropharynx is clear and moist.  Eyes: Conjunctivae are normal.  Neck: Neck supple. No JVD present. No tracheal deviation present.  Cardiovascular: Normal rate, regular rhythm, normal heart sounds and intact distal pulses.   Pulmonary/Chest: Effort normal. No accessory muscle usage.  Mild wheezing bil.   Abdominal: Soft. He exhibits no distension. There is no tenderness.  Musculoskeletal: Normal range of motion. He exhibits no edema and no tenderness.  Neurological: He is alert and oriented to person, place, and time.  Skin: Skin is warm and dry.  Psychiatric: He has a normal mood and affect.    ED Course  Procedures (including critical care time)     MDM  Albuterol neb.  Pt requests alb mdi and d/c.    Reviewed nursing notes and prior charts for additional history.   Pt w recent case manager notes in prior chart - has been referred to thn, pcp f/u given multiple recurrent ed visits for same.   Recheck no increased wob, pt appears stable for d/c.         Suzi Roots, MD 01/25/13 2127

## 2013-01-25 NOTE — Telephone Encounter (Signed)
I did not call Mr [name] when I saw that he had been in the ED at [facility] today. I have called [facility] [name] to see that this gentleman is on their radar in an effort to keep him out of the ED.

## 2013-01-26 ENCOUNTER — Emergency Department (HOSPITAL_COMMUNITY)
Admission: EM | Admit: 2013-01-26 | Discharge: 2013-01-26 | Disposition: A | Discharge status: Home / Self Care | Payer: PRIVATE HEALTH INSURANCE | Attending: Emergency Medicine | Admitting: Emergency Medicine

## 2013-01-26 ENCOUNTER — Encounter (HOSPITAL_COMMUNITY): Payer: Self-pay | Admitting: Emergency Medicine

## 2013-01-26 DIAGNOSIS — Z87891 Personal history of nicotine dependence: Secondary | ICD-10-CM | POA: Insufficient documentation

## 2013-01-26 DIAGNOSIS — Z79899 Other long term (current) drug therapy: Secondary | ICD-10-CM | POA: Insufficient documentation

## 2013-01-26 DIAGNOSIS — Z8669 Personal history of other diseases of the nervous system and sense organs: Secondary | ICD-10-CM | POA: Insufficient documentation

## 2013-01-26 DIAGNOSIS — F411 Generalized anxiety disorder: Secondary | ICD-10-CM | POA: Insufficient documentation

## 2013-01-26 DIAGNOSIS — J441 Chronic obstructive pulmonary disease with (acute) exacerbation: Secondary | ICD-10-CM | POA: Insufficient documentation

## 2013-01-26 DIAGNOSIS — Z862 Personal history of diseases of the blood and blood-forming organs and certain disorders involving the immune mechanism: Secondary | ICD-10-CM | POA: Insufficient documentation

## 2013-01-26 DIAGNOSIS — Z8659 Personal history of other mental and behavioral disorders: Secondary | ICD-10-CM | POA: Insufficient documentation

## 2013-01-26 DIAGNOSIS — IMO0002 Reserved for concepts with insufficient information to code with codable children: Secondary | ICD-10-CM | POA: Insufficient documentation

## 2013-01-26 DIAGNOSIS — J449 Chronic obstructive pulmonary disease, unspecified: Secondary | ICD-10-CM

## 2013-01-26 DIAGNOSIS — Z87738 Personal history of other specified (corrected) congenital malformations of digestive system: Secondary | ICD-10-CM | POA: Insufficient documentation

## 2013-01-26 DIAGNOSIS — Z8719 Personal history of other diseases of the digestive system: Secondary | ICD-10-CM | POA: Insufficient documentation

## 2013-01-26 DIAGNOSIS — I10 Essential (primary) hypertension: Secondary | ICD-10-CM | POA: Insufficient documentation

## 2013-01-26 NOTE — ED Notes (Signed)
Patient states he ate and developed shortness of breath.  Patient presents via [facility] with nebulizer in process.

## 2013-01-26 NOTE — ED Provider Notes (Signed)
History     CSN: [id number]  Arrival date & time 01/26/13  2052   First MD Initiated Contact with Patient 01/26/13 2057      Chief Complaint  Patient presents with  . Shortness of Breath    (Consider location/radiation/quality/duration/timing/severity/associated sxs/prior treatment) Patient is a 79 y.o. male presenting with shortness of breath. The history is provided by the patient.  Shortness of Breath Associated symptoms: wheezing   Associated symptoms: no abdominal pain, no chest pain, no fever, no headaches and no sore throat   pt with hx copd, states he felt as if he developed some wheezing tonight.  Denies sore throat, runny nose or worsening cough.  No chest pain, tightness or discomfort. Denies fever or chills. No leg pain or swelling. Denies orthopnea or pnd.  Pt well known to ED staff, w hx frequent ED visits for wheezing. Pt states he is compliant w his normal meds, has adequate mdi/alb at home.     Past Medical History  Diagnosis Date  . COPD (chronic obstructive pulmonary disease)   . Anxiety   . Alcohol abuse   . Anemia   . Generalized headaches   . Hypertension   . 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 Pulmonary nodule/lesion, solitary   . Schizoaffective disorder   . GI bleed     History of recurrent bleeding that dates back as far as 2004 per records,    Past Surgical History  Procedure Laterality Date  . Colonoscopy  Sept 2009    SLF: few small AVMs in ascending colon distal to IC . Ablated via BICAP.   Marland Kitchen Esophagogastroduodenoscopy  Sept 2009    SLF: normal esophagus, small HH, 1-2 AVMs actively oozing in mid stomach, s/p BICAP, path benign   . Esophagogastroduodenoscopy  01/18/2012    Rourk-Erosive reflux esophagitis. Noncritical ring. Antral erosions/ small antral ulcer - appeared innocent  (H pylori serrology negative)  . Colonoscopy  03/09/2012    Procedure: COLONOSCOPY;  Surgeon: West Bali, MD;  Location: [facility];  Service: Endoscopy;  Laterality: N/A;  2:30  . Esophagogastroduodenoscopy  03/09/2012    Procedure: ESOPHAGOGASTRODUODENOSCOPY (EGD);  Surgeon: West Bali, MD;  Location: [facility];  Service: Endoscopy;  Laterality: N/A;    Family History  Problem Relation Age of Onset  . Colon cancer Neg Hx     History  Substance Use Topics  . Smoking status: Former Smoker    Types: Cigarettes    Quit date: 05/07/1969  . Smokeless tobacco: Current User    Types: Snuff  . Alcohol Use: 2.4 oz/week    4 Cans of beer per week     Comment: last drank today 01/12/13      Review of Systems  Constitutional: Negative for fever and chills.  HENT: Negative for sore throat.   Respiratory: Positive for shortness of breath and wheezing.   Cardiovascular: Negative for chest pain and leg swelling.  Gastrointestinal: Negative for abdominal pain.  Neurological: Negative for headaches.    Allergies  Black pepper  Home Medications   Current Outpatient Rx  Name  Route  Sig  Dispense  Refill  . albuterol (PROAIR HFA) 108 (90 BASE) MCG/ACT inhaler   Inhalation   Inhale 2 puffs into the lungs every 6 (six) hours as needed for wheezing or shortness of breath.         Marland Kitchen albuterol (  PROVENTIL HFA;VENTOLIN HFA) 108 (90 BASE) MCG/ACT inhaler   Inhalation   Inhale 1-2 puffs into the lungs every 6 (six) hours as needed for wheezing.   1 Inhaler   0   . diazepam (VALIUM) 5 MG tablet   Oral   Take 5 mg by mouth 3 (three) times daily.         Marland Kitchen HYDROcodone-acetaminophen (NORCO/VICODIN) 5-325 MG per tablet   Oral   Take 1 tablet by mouth 4 (four) times daily.         . Ipratropium-Albuterol (COMBIVENT RESPIMAT) 20-100 MCG/ACT AERS respimat   Inhalation   Inhale 1 puff into the lungs every 6 (six) hours as needed for wheezing or shortness of breath.         . Menthol (HALLS COUGH DROPS) 5.8 MG LOZG   Mouth/Throat   Use as directed 1 lozenge in the mouth  or throat 3 (three) times daily as needed (for cough and sore throat).         . predniSONE (DELTASONE) 20 MG tablet   Oral   Take 3 tablets (60 mg total) by mouth daily.   15 tablet   0     BP 150/99  Pulse 102  Temp(Src) 99.6 F (37.6 C) (Oral)  Resp 20  Wt 113 lb (51.256 kg)  BMI 18.8 kg/m2  SpO2 100%  Physical Exam  Nursing note and vitals reviewed. Constitutional: He is oriented to person, place, and time. He appears well-developed and well-nourished. No distress.  HENT:  Nose: Nose normal.  Mouth/Throat: Oropharynx is clear and moist.  Eyes: Conjunctivae are normal.  Neck: Neck supple. No JVD present. No tracheal deviation present.  Cardiovascular: Normal rate, regular rhythm, normal heart sounds and intact distal pulses.   Pulmonary/Chest: Effort normal. No accessory muscle usage. No respiratory distress.  Mild wheezing bil.   Abdominal: Soft. He exhibits no distension. There is no tenderness.  Musculoskeletal: Normal range of motion. He exhibits no edema and no tenderness.  Neurological: He is alert and oriented to person, place, and time.  Skin: Skin is warm and dry.  Psychiatric: He has a normal mood and affect.    ED Course  Procedures (including critical care time)     MDM  Albuterol neb.  Reviewed nursing notes and prior charts for additional history.   Recheck no wheezing or increased wob. Pt requests d/c.           Suzi Roots, MD 01/29/13 [phone]

## 2013-01-26 NOTE — ED Notes (Signed)
Requesting additional nebulizer medication - treatment has been completed - wants more or something stronger

## 2013-01-28 ENCOUNTER — Telehealth: Payer: Self-pay | Admitting: *Deleted

## 2013-01-28 ENCOUNTER — Emergency Department (HOSPITAL_COMMUNITY)
Admission: EM | Admit: 2013-01-28 | Discharge: 2013-01-28 | Disposition: A | Discharge status: Home / Self Care | Payer: PRIVATE HEALTH INSURANCE | Attending: Emergency Medicine | Admitting: Emergency Medicine

## 2013-01-28 ENCOUNTER — Encounter (HOSPITAL_COMMUNITY): Payer: Self-pay

## 2013-01-28 DIAGNOSIS — F411 Generalized anxiety disorder: Secondary | ICD-10-CM | POA: Insufficient documentation

## 2013-01-28 DIAGNOSIS — Z8659 Personal history of other mental and behavioral disorders: Secondary | ICD-10-CM | POA: Insufficient documentation

## 2013-01-28 DIAGNOSIS — Z87891 Personal history of nicotine dependence: Secondary | ICD-10-CM | POA: Insufficient documentation

## 2013-01-28 DIAGNOSIS — Z8719 Personal history of other diseases of the digestive system: Secondary | ICD-10-CM | POA: Insufficient documentation

## 2013-01-28 DIAGNOSIS — I10 Essential (primary) hypertension: Secondary | ICD-10-CM | POA: Insufficient documentation

## 2013-01-28 DIAGNOSIS — J441 Chronic obstructive pulmonary disease with (acute) exacerbation: Secondary | ICD-10-CM | POA: Insufficient documentation

## 2013-01-28 DIAGNOSIS — Z79899 Other long term (current) drug therapy: Secondary | ICD-10-CM | POA: Insufficient documentation

## 2013-01-28 DIAGNOSIS — Z862 Personal history of diseases of the blood and blood-forming organs and certain disorders involving the immune mechanism: Secondary | ICD-10-CM | POA: Insufficient documentation

## 2013-01-28 DIAGNOSIS — Z8709 Personal history of other diseases of the respiratory system: Secondary | ICD-10-CM | POA: Insufficient documentation

## 2013-01-28 DIAGNOSIS — J449 Chronic obstructive pulmonary disease, unspecified: Secondary | ICD-10-CM

## 2013-01-28 MED ORDER — ALBUTEROL SULFATE (5 MG/ML) 0.5% IN NEBU
5.0000 mg | INHALATION_SOLUTION | Freq: Once | RESPIRATORY_TRACT | Status: AC
  Administered 2013-01-28: 5 mg via RESPIRATORY_TRACT
  Filled 2013-01-28: qty 1

## 2013-01-28 MED ORDER — IPRATROPIUM BROMIDE 0.02 % IN SOLN
0.5000 mg | Freq: Once | RESPIRATORY_TRACT | Status: AC
  Administered 2013-01-28: 0.5 mg via RESPIRATORY_TRACT
  Filled 2013-01-28: qty 2.5

## 2013-01-28 MED ORDER — ALBUTEROL SULFATE (5 MG/ML) 0.5% IN NEBU: 2.5000 mg | INHALATION_SOLUTION | Freq: Once | RESPIRATORY_TRACT | Status: DC

## 2013-01-28 NOTE — ED Provider Notes (Signed)
History     CSN: [id number]  Arrival date & time 01/28/13  4098   First MD Initiated Contact with Patient 01/28/13 1938      Chief Complaint  Patient presents with  . Shortness of Breath     HPI Pt was seen at 2010.   Per pt, c/o gradual onset and persistence of constant "wheezing and SOB" for the past several hours.  EMS gave neb treatment en route with partial relief.  Unclear if pt has used his home MDI today.  Denies CP/palpitations, no back pain, no abd pain, no N/V/D, no fevers, no rash.  The symptoms have been associated with no other complaints. The patient has a significant history of similar symptoms previously, recently being evaluated for this complaint and multiple prior evals for same.  Pt has 69 ED visits in the past 6 months for the same complaint.        Past Medical History  Diagnosis Date  . COPD (chronic obstructive pulmonary disease)   . Anxiety   . Alcohol abuse   . Anemia   . Generalized headaches   . Hypertension   . 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 Pulmonary nodule/lesion, solitary   . Schizoaffective disorder   . GI bleed     History of recurrent bleeding that dates back as far as 2004 per records,    Past Surgical History  Procedure Laterality Date  . Colonoscopy  Sept 2009    SLF: few small AVMs in ascending colon distal to IC . Ablated via BICAP.   Marland Kitchen Esophagogastroduodenoscopy  Sept 2009    SLF: normal esophagus, small HH, 1-2 AVMs actively oozing in mid stomach, s/p BICAP, path benign   . Esophagogastroduodenoscopy  01/18/2012    Rourk-Erosive reflux esophagitis. Noncritical ring. Antral erosions/ small antral ulcer - appeared innocent  (H pylori serrology negative)  . Colonoscopy  03/09/2012    Procedure: COLONOSCOPY;  Surgeon: West Bali, MD;  Location: [facility];  Service: Endoscopy;  Laterality: N/A;  2:30  . Esophagogastroduodenoscopy  03/09/2012    Procedure:  ESOPHAGOGASTRODUODENOSCOPY (EGD);  Surgeon: West Bali, MD;  Location: [facility];  Service: Endoscopy;  Laterality: N/A;    Family History  Problem Relation Age of Onset  . Colon cancer Neg Hx     History  Substance Use Topics  . Smoking status: Former Smoker    Types: Cigarettes    Quit date: 05/07/1969  . Smokeless tobacco: Current User    Types: Snuff  . Alcohol Use: 2.4 oz/week    4 Cans of beer per week     Comment: last drank today 01/12/13      Review of Systems ROS: Statement: All systems negative except as marked or noted in the HPI; Constitutional: Negative for fever and chills. ; ; Eyes: Negative for eye pain, redness and discharge. ; ; ENMT: Negative for ear pain, hoarseness, nasal congestion, sinus pressure and sore throat. ; ; Cardiovascular: Negative for chest pain, palpitations, diaphoresis, and peripheral edema. ; ; Respiratory: +SOB, wheezing. Negative for cough and stridor. ; ; Gastrointestinal: Negative for nausea, vomiting, diarrhea, abdominal pain, blood in stool, hematemesis, jaundice and rectal bleeding. . ; ; Genitourinary: Negative for dysuria, flank pain and hematuria. ; ; Musculoskeletal: Negative for back pain and neck pain. Negative for swelling and trauma.; ; Skin: Negative for pruritus, rash, abrasions, blisters, bruising and skin  lesion.; ; Neuro: Negative for headache, lightheadedness and neck stiffness. Negative for weakness, altered level of consciousness , altered mental status, extremity weakness, paresthesias, involuntary movement, seizure and syncope.       Allergies  Black pepper  Home Medications   Current Outpatient Rx  Name  Route  Sig  Dispense  Refill  . albuterol (PROAIR HFA) 108 (90 BASE) MCG/ACT inhaler   Inhalation   Inhale 2 puffs into the lungs every 6 (six) hours as needed for wheezing or shortness of breath.         Marland Kitchen albuterol (PROVENTIL HFA;VENTOLIN HFA) 108 (90 BASE) MCG/ACT inhaler   Inhalation   Inhale 1-2  puffs into the lungs every 6 (six) hours as needed for wheezing.   1 Inhaler   0   . diazepam (VALIUM) 5 MG tablet   Oral   Take 5 mg by mouth 3 (three) times daily.         Marland Kitchen HYDROcodone-acetaminophen (NORCO/VICODIN) 5-325 MG per tablet   Oral   Take 1 tablet by mouth 4 (four) times daily.         . Ipratropium-Albuterol (COMBIVENT RESPIMAT) 20-100 MCG/ACT AERS respimat   Inhalation   Inhale 1 puff into the lungs every 6 (six) hours as needed for wheezing or shortness of breath.         . Menthol (HALLS COUGH DROPS) 5.8 MG LOZG   Mouth/Throat   Use as directed 1 lozenge in the mouth or throat 3 (three) times daily as needed (for cough and sore throat).         . predniSONE (DELTASONE) 20 MG tablet   Oral   Take 3 tablets (60 mg total) by mouth daily.   15 tablet   0     BP 133/82  Pulse 111  Temp(Src) 98.3 F (36.8 C) (Oral)  Wt 136 lb (61.689 kg)  BMI 22.63 kg/m2  SpO2 95%  Physical Exam 2015: Physical examination:  Nursing notes reviewed; Vital signs and O2 SAT reviewed;  Constitutional: Well developed, Well nourished, Well hydrated, In no acute distress; Head:  Normocephalic, atraumatic; Eyes: EOMI, PERRL, No scleral icterus; ENMT: Mouth and pharynx normal, Mucous membranes moist; Neck: Supple, Full range of motion, No lymphadenopathy; Cardiovascular: Regular rate and rhythm, No gallop; Respiratory: Breath sounds coarse & equal bilaterally, exp wheezes bilat. No audible wheezing.  Speaking full sentences with ease, Normal respiratory effort/excursion; Chest: Nontender, Movement normal; Abdomen: Soft, Nontender, Nondistended, Normal bowel sounds; Genitourinary: No CVA tenderness; Extremities: Pulses normal, No tenderness, No edema, No calf edema or asymmetry.; Neuro: AA&Ox3, Major CN grossly intact.  Speech clear. Climbs on and off stretcher easily by himself. Gait steady. No gross focal motor or sensory deficits in extremities.; Skin: Color normal, Warm, Dry.   ED  Course  Procedures   MDM  MDM Reviewed: previous chart, nursing note and vitals     2130:  Pt given neb treatment.  Now states he wants to go home and is starting to roam the ED hallways, making his way towards the exit, yelling at ED staff. Resps easy, NAD.  Will d/c with strong encouragement to f/u with PMD.         Laray Anger, DO 01/29/13 1759

## 2013-01-28 NOTE — ED Notes (Signed)
Poison control notified that patient may have drank ipratropium 0.5mg  and albuterol nebulizer. Ipratropium is poorly absorbed, albuterol is poorly absorbed. Still the same dose that he normally takes. Recommended that we keep him until his heart rate comes down.

## 2013-01-28 NOTE — ED Notes (Signed)
Wheezing, shortness of breath per EMS. Patient was given albuterol treatment enroute to the ER. Neighbor told EMS that he drank a dose of Albuterol, patient states that he may have.

## 2013-01-29 ENCOUNTER — Emergency Department (HOSPITAL_COMMUNITY)
Admission: EM | Admit: 2013-01-29 | Discharge: 2013-01-29 | Disposition: A | Discharge status: Home / Self Care | Payer: PRIVATE HEALTH INSURANCE | Attending: Emergency Medicine | Admitting: Emergency Medicine

## 2013-01-29 DIAGNOSIS — Z8719 Personal history of other diseases of the digestive system: Secondary | ICD-10-CM | POA: Insufficient documentation

## 2013-01-29 DIAGNOSIS — J449 Chronic obstructive pulmonary disease, unspecified: Secondary | ICD-10-CM

## 2013-01-29 DIAGNOSIS — I10 Essential (primary) hypertension: Secondary | ICD-10-CM | POA: Insufficient documentation

## 2013-01-29 DIAGNOSIS — Z8709 Personal history of other diseases of the respiratory system: Secondary | ICD-10-CM | POA: Insufficient documentation

## 2013-01-29 DIAGNOSIS — F411 Generalized anxiety disorder: Secondary | ICD-10-CM | POA: Insufficient documentation

## 2013-01-29 DIAGNOSIS — Z87891 Personal history of nicotine dependence: Secondary | ICD-10-CM | POA: Insufficient documentation

## 2013-01-29 DIAGNOSIS — F1010 Alcohol abuse, uncomplicated: Secondary | ICD-10-CM | POA: Insufficient documentation

## 2013-01-29 DIAGNOSIS — J441 Chronic obstructive pulmonary disease with (acute) exacerbation: Secondary | ICD-10-CM | POA: Insufficient documentation

## 2013-01-29 DIAGNOSIS — Z79899 Other long term (current) drug therapy: Secondary | ICD-10-CM | POA: Insufficient documentation

## 2013-01-29 DIAGNOSIS — Z862 Personal history of diseases of the blood and blood-forming organs and certain disorders involving the immune mechanism: Secondary | ICD-10-CM | POA: Insufficient documentation

## 2013-01-29 DIAGNOSIS — Z8659 Personal history of other mental and behavioral disorders: Secondary | ICD-10-CM | POA: Insufficient documentation

## 2013-01-29 NOTE — ED Notes (Signed)
Security called to calm pt down.

## 2013-01-29 NOTE — ED Notes (Signed)
Pt very loud and disruptive. Security escorted pt out.

## 2013-01-29 NOTE — ED Notes (Signed)
Complain of being SOB. Oxygen sat 99-100% on two liters of oxygen. Pt keeps repeating he can't breath.

## 2013-01-29 NOTE — ED Notes (Signed)
Pt up asking everyone for drinks, pain pills, and nerve pills.

## 2013-01-29 NOTE — ED Notes (Signed)
EDP in to see pt prior to nurse.

## 2013-01-29 NOTE — ED Provider Notes (Signed)
History     CSN: [id number]  Arrival date & time 01/29/13  1744   First MD Initiated Contact with Patient 01/29/13 1758      Chief Complaint  Patient presents with  . Shortness of Breath    (Consider location/radiation/quality/duration/timing/severity/associated sxs/prior treatment) HPI Comments: [name] is a 79 y.o. male with usual complaint of dyspnea. He came this time, by EMS. He requests a nebulizer. EMS only treated him with oxygen during transport. He has numerous visits including last , yesterday. He cannot specify anything further. There no known modifying factors.  The history is provided by the patient.    Past Medical History  Diagnosis Date  . COPD (chronic obstructive pulmonary disease)   . Anxiety   . Alcohol abuse   . Anemia   . Generalized headaches   . Hypertension   . 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 Pulmonary nodule/lesion, solitary   . Schizoaffective disorder   . GI bleed     History of recurrent bleeding that dates back as far as 2004 per records,    Past Surgical History  Procedure Laterality Date  . Colonoscopy  Sept 2009    SLF: few small AVMs in ascending colon distal to IC . Ablated via BICAP.   Marland Kitchen Esophagogastroduodenoscopy  Sept 2009    SLF: normal esophagus, small HH, 1-2 AVMs actively oozing in mid stomach, s/p BICAP, path benign   . Esophagogastroduodenoscopy  01/18/2012    Rourk-Erosive reflux esophagitis. Noncritical ring. Antral erosions/ small antral ulcer - appeared innocent  (H pylori serrology negative)  . Colonoscopy  03/09/2012    Procedure: COLONOSCOPY;  Surgeon: West Bali, MD;  Location: [facility];  Service: Endoscopy;  Laterality: N/A;  2:30  . Esophagogastroduodenoscopy  03/09/2012    Procedure: ESOPHAGOGASTRODUODENOSCOPY (EGD);  Surgeon: West Bali, MD;  Location: [facility];  Service: Endoscopy;  Laterality: N/A;    Family History  Problem  Relation Age of Onset  . Colon cancer Neg Hx     History  Substance Use Topics  . Smoking status: Former Smoker    Types: Cigarettes    Quit date: 05/07/1969  . Smokeless tobacco: Current User    Types: Snuff  . Alcohol Use: 2.4 oz/week    4 Cans of beer per week     Comment: last drank today 01/12/13      Review of Systems  All other systems reviewed and are negative.    Allergies  Black pepper  Home Medications   Current Outpatient Rx  Name  Route  Sig  Dispense  Refill  . albuterol (PROAIR HFA) 108 (90 BASE) MCG/ACT inhaler   Inhalation   Inhale 2 puffs into the lungs every 6 (six) hours as needed for wheezing or shortness of breath.         Marland Kitchen albuterol (PROVENTIL HFA;VENTOLIN HFA) 108 (90 BASE) MCG/ACT inhaler   Inhalation   Inhale 1-2 puffs into the lungs every 6 (six) hours as needed for wheezing.   1 Inhaler   0   . diazepam (VALIUM) 5 MG tablet   Oral   Take 5 mg by mouth 3 (three) times daily.         Marland Kitchen HYDROcodone-acetaminophen (NORCO/VICODIN) 5-325 MG per tablet   Oral   Take 1 tablet by mouth 4 (four) times daily.         . Ipratropium-Albuterol (  COMBIVENT RESPIMAT) 20-100 MCG/ACT AERS respimat   Inhalation   Inhale 1 puff into the lungs every 6 (six) hours as needed for wheezing or shortness of breath.         . Menthol (HALLS COUGH DROPS) 5.8 MG LOZG   Mouth/Throat   Use as directed 1 lozenge in the mouth or throat 3 (three) times daily as needed (for cough and sore throat).         . predniSONE (DELTASONE) 20 MG tablet   Oral   Take 3 tablets (60 mg total) by mouth daily.   15 tablet   0     BP 138/74  Pulse 73  Temp(Src) 97.9 F (36.6 C) (Oral)  Resp 20  SpO2 93%  Physical Exam  Nursing note and vitals reviewed. Constitutional: He is oriented to person, place, and time. He appears well-developed and well-nourished. He appears distressed Management consultant).  HENT:  Head: Normocephalic and atraumatic.  Right Ear: External  ear normal.  Left Ear: External ear normal.  Eyes: Conjunctivae and EOM are normal. Pupils are equal, round, and reactive to light.  Neck: Normal range of motion and phonation normal. Neck supple.  Cardiovascular: Normal rate, regular rhythm, normal heart sounds and intact distal pulses.   Pulmonary/Chest: Effort normal. He exhibits no bony tenderness.  Scattered wheezes  Abdominal: Soft. Normal appearance. There is no tenderness.  Musculoskeletal: Normal range of motion.  Neurological: He is alert and oriented to person, place, and time. He has normal strength. No cranial nerve deficit or sensory deficit. He exhibits normal muscle tone. Coordination normal.  Skin: Skin is warm, dry and intact.  Psychiatric: He has a normal mood and affect. His behavior is normal. Judgment and thought content normal.    ED Course  Procedures (including critical care time)  Repeat oxygen saturation, on room air, is 93%, and normal  Nursing Notes Reviewed/ Care Coordinated, and agree without changes. Applicable Imaging Reviewed Interpretation of Laboratory Data incorporated into ED treatment    1. COPD (chronic obstructive pulmonary disease)       MDM  Usual complaint. Doubt serious illness, at this time   Plan: Home Medications- usual; Home Treatments- rest; Recommended follow up- PCP        Flint Melter, MD 01/29/13 2232

## 2013-01-30 ENCOUNTER — Emergency Department (HOSPITAL_COMMUNITY)
Admission: EM | Admit: 2013-01-30 | Discharge: 2013-01-30 | Discharge status: Against Medical Advice | Payer: PRIVATE HEALTH INSURANCE | Attending: Emergency Medicine | Admitting: Emergency Medicine

## 2013-01-30 ENCOUNTER — Encounter (HOSPITAL_COMMUNITY): Payer: Self-pay | Admitting: *Deleted

## 2013-01-30 ENCOUNTER — Emergency Department (HOSPITAL_COMMUNITY): Payer: PRIVATE HEALTH INSURANCE

## 2013-01-30 DIAGNOSIS — Z87891 Personal history of nicotine dependence: Secondary | ICD-10-CM | POA: Insufficient documentation

## 2013-01-30 DIAGNOSIS — J449 Chronic obstructive pulmonary disease, unspecified: Secondary | ICD-10-CM

## 2013-01-30 DIAGNOSIS — Z8669 Personal history of other diseases of the nervous system and sense organs: Secondary | ICD-10-CM | POA: Insufficient documentation

## 2013-01-30 DIAGNOSIS — Z8719 Personal history of other diseases of the digestive system: Secondary | ICD-10-CM | POA: Insufficient documentation

## 2013-01-30 DIAGNOSIS — J441 Chronic obstructive pulmonary disease with (acute) exacerbation: Secondary | ICD-10-CM | POA: Insufficient documentation

## 2013-01-30 DIAGNOSIS — Z79899 Other long term (current) drug therapy: Secondary | ICD-10-CM | POA: Insufficient documentation

## 2013-01-30 DIAGNOSIS — Z87738 Personal history of other specified (corrected) congenital malformations of digestive system: Secondary | ICD-10-CM | POA: Insufficient documentation

## 2013-01-30 DIAGNOSIS — Z8659 Personal history of other mental and behavioral disorders: Secondary | ICD-10-CM | POA: Insufficient documentation

## 2013-01-30 DIAGNOSIS — I10 Essential (primary) hypertension: Secondary | ICD-10-CM | POA: Insufficient documentation

## 2013-01-30 DIAGNOSIS — Z862 Personal history of diseases of the blood and blood-forming organs and certain disorders involving the immune mechanism: Secondary | ICD-10-CM | POA: Insufficient documentation

## 2013-01-30 DIAGNOSIS — R0989 Other specified symptoms and signs involving the circulatory and respiratory systems: Secondary | ICD-10-CM | POA: Insufficient documentation

## 2013-01-30 DIAGNOSIS — IMO0002 Reserved for concepts with insufficient information to code with codable children: Secondary | ICD-10-CM | POA: Insufficient documentation

## 2013-01-30 DIAGNOSIS — R0609 Other forms of dyspnea: Secondary | ICD-10-CM | POA: Insufficient documentation

## 2013-01-30 DIAGNOSIS — F411 Generalized anxiety disorder: Secondary | ICD-10-CM | POA: Insufficient documentation

## 2013-01-30 MED ORDER — IPRATROPIUM BROMIDE 0.02 % IN SOLN
0.5000 mg | Freq: Once | RESPIRATORY_TRACT | Status: AC
  Administered 2013-01-30: 0.5 mg via RESPIRATORY_TRACT
  Filled 2013-01-30: qty 2.5

## 2013-01-30 MED ORDER — ALBUTEROL SULFATE (5 MG/ML) 0.5% IN NEBU
2.5000 mg | INHALATION_SOLUTION | Freq: Once | RESPIRATORY_TRACT | Status: AC
  Administered 2013-01-30: 2.5 mg via RESPIRATORY_TRACT
  Filled 2013-01-30: qty 0.5

## 2013-01-30 NOTE — ED Provider Notes (Signed)
History  This chart was scribed for Glynn Octave, MD by Shari Heritage, ED Scribe. The patient was seen in room [facility]. Patient's care was started at 1705.   CSN: [id number]  Arrival date & time 01/30/13  1540   First MD Initiated Contact with Patient 01/30/13 1705      Chief Complaint  Patient presents with  . Shortness of Breath     The history is provided by the patient. No language interpreter was used.    HPI Comments: [name] is a 79 y.o. male with history of COPD and history of numerous visits for the same who presents to the Emergency Department complaining of persistent dyspnea for the past several hours. Patient says that he has a inhaler at home, but it isn't providing relief. He is requesting a nebulizer treatment. He was seen here yesterday for this problem. Patient denies cough, chest pain, abdominal pain, blood in stool, dizziness or lightheadedness. He says that he has been using chewing tobacco, but is planning to quit. He does not smoke. Patient's other medical history includes hypertension, anxiety, and schizoaffective disorder.   Past Medical History  Diagnosis Date  . COPD (chronic obstructive pulmonary disease)   . Anxiety   . Alcohol abuse   . Anemia   . Generalized headaches   . Hypertension   . 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 Pulmonary nodule/lesion, solitary   . Schizoaffective disorder   . GI bleed     History of recurrent bleeding that dates back as far as 2004 per records,    Past Surgical History  Procedure Laterality Date  . Colonoscopy  Sept 2009    SLF: few small AVMs in ascending colon distal to IC . Ablated via BICAP.   Marland Kitchen Esophagogastroduodenoscopy  Sept 2009    SLF: normal esophagus, small HH, 1-2 AVMs actively oozing in mid stomach, s/p BICAP, path benign   . Esophagogastroduodenoscopy  01/18/2012    Rourk-Erosive reflux esophagitis. Noncritical ring. Antral  erosions/ small antral ulcer - appeared innocent  (H pylori serrology negative)  . Colonoscopy  03/09/2012    Procedure: COLONOSCOPY;  Surgeon: West Bali, MD;  Location: [facility];  Service: Endoscopy;  Laterality: N/A;  2:30  . Esophagogastroduodenoscopy  03/09/2012    Procedure: ESOPHAGOGASTRODUODENOSCOPY (EGD);  Surgeon: West Bali, MD;  Location: [facility];  Service: Endoscopy;  Laterality: N/A;    Family History  Problem Relation Age of Onset  . Colon cancer Neg Hx     History  Substance Use Topics  . Smoking status: Former Smoker    Types: Cigarettes    Quit date: 05/07/1969  . Smokeless tobacco: Current User    Types: Snuff  . Alcohol Use: 2.4 oz/week    4 Cans of beer per week     Comment: last drank today 01/12/13      Review of Systems A complete 10 system review of systems was obtained and all systems are negative except as noted in the HPI and PMH.   Allergies  Black pepper  Home Medications   Current Outpatient Rx  Name  Route  Sig  Dispense  Refill  . albuterol (PROAIR HFA) 108 (90 BASE) MCG/ACT inhaler   Inhalation   Inhale 2 puffs into the lungs every 6 (six) hours as needed for wheezing or shortness of breath.         Marland Kitchen  albuterol (PROVENTIL HFA;VENTOLIN HFA) 108 (90 BASE) MCG/ACT inhaler   Inhalation   Inhale 1-2 puffs into the lungs every 6 (six) hours as needed for wheezing.   1 Inhaler   0   . diazepam (VALIUM) 5 MG tablet   Oral   Take 5 mg by mouth 3 (three) times daily.         Marland Kitchen HYDROcodone-acetaminophen (NORCO/VICODIN) 5-325 MG per tablet   Oral   Take 1 tablet by mouth 4 (four) times daily.         . Ipratropium-Albuterol (COMBIVENT RESPIMAT) 20-100 MCG/ACT AERS respimat   Inhalation   Inhale 1 puff into the lungs every 6 (six) hours as needed for wheezing or shortness of breath.         . Menthol (HALLS COUGH DROPS) 5.8 MG LOZG   Mouth/Throat   Use as directed 1 lozenge in the mouth or throat 3 (three) times  daily as needed (for cough and sore throat).         . predniSONE (DELTASONE) 20 MG tablet   Oral   Take 3 tablets (60 mg total) by mouth daily.   15 tablet   0     Triage Vitals: BP 100/59  Pulse 115  Temp(Src) 97.3 F (36.3 C) (Oral)  Resp 24  SpO2 96%  Physical Exam  Constitutional: He is oriented to person, place, and time. He appears well-developed and well-nourished.  HENT:  Head: Normocephalic and atraumatic.  Eyes: Conjunctivae and EOM are normal. Pupils are equal, round, and reactive to light.  Neck: Normal range of motion. Neck supple.  Cardiovascular: Normal rate, regular rhythm and normal heart sounds.   Pulmonary/Chest: Effort normal. He has wheezes (scattered, expiratory).  Speaking in full sentences. Appears to be at baseline.  Abdominal: Soft.  Musculoskeletal: Normal range of motion.  Neurological: He is alert and oriented to person, place, and time.  Skin: Skin is warm and dry.    ED Course  Procedures (including critical care time) DIAGNOSTIC STUDIES: Oxygen Saturation is 96% on room air, adequate by my interpretation.    COORDINATION OF CARE: 5:12 PM- Patient informed of current plan for treatment and evaluation and agrees with plan at this time.   5:47 PM- Per ED staff, patient left the department upon receiving breathing treatment.     No diagnosis found.    MDM  Patient well known to the ED presenting with 2 hours of shortness of breath that he believes is from chewing tobacco. Denies any chest pain, cough, fever. Asking for inhaler.  Speaking in full sentences, scattered expiratory wheezes. Appears to be at baseline.  Patient received his breathing treatment and left the ED prior to reassessment or receiving discharge instructions.   I personally performed the services described in this documentation, which was scribed in my presence. The recorded information has been reviewed and is accurate.   Glynn Octave, MD 01/30/13 [id number]

## 2013-01-30 NOTE — ED Notes (Signed)
Pt c/o SOB, thinks it's caused by his chewing tobacco and requesting an inhaler

## 2013-01-30 NOTE — ED Notes (Signed)
Pt received breathing treatment then left

## 2013-02-03 ENCOUNTER — Encounter (HOSPITAL_COMMUNITY): Payer: Self-pay | Admitting: Emergency Medicine

## 2013-02-03 ENCOUNTER — Emergency Department (HOSPITAL_COMMUNITY)
Admission: EM | Admit: 2013-02-03 | Discharge: 2013-02-03 | Discharge status: Against Medical Advice | Payer: PRIVATE HEALTH INSURANCE | Attending: Emergency Medicine | Admitting: Emergency Medicine

## 2013-02-03 DIAGNOSIS — F411 Generalized anxiety disorder: Secondary | ICD-10-CM | POA: Insufficient documentation

## 2013-02-03 DIAGNOSIS — J449 Chronic obstructive pulmonary disease, unspecified: Secondary | ICD-10-CM

## 2013-02-03 DIAGNOSIS — J441 Chronic obstructive pulmonary disease with (acute) exacerbation: Secondary | ICD-10-CM | POA: Insufficient documentation

## 2013-02-03 DIAGNOSIS — Z87891 Personal history of nicotine dependence: Secondary | ICD-10-CM | POA: Insufficient documentation

## 2013-02-03 DIAGNOSIS — F259 Schizoaffective disorder, unspecified: Secondary | ICD-10-CM | POA: Insufficient documentation

## 2013-02-03 DIAGNOSIS — Z79899 Other long term (current) drug therapy: Secondary | ICD-10-CM | POA: Insufficient documentation

## 2013-02-03 DIAGNOSIS — Z8669 Personal history of other diseases of the nervous system and sense organs: Secondary | ICD-10-CM | POA: Insufficient documentation

## 2013-02-03 DIAGNOSIS — I10 Essential (primary) hypertension: Secondary | ICD-10-CM | POA: Insufficient documentation

## 2013-02-03 DIAGNOSIS — Z8719 Personal history of other diseases of the digestive system: Secondary | ICD-10-CM | POA: Insufficient documentation

## 2013-02-03 MED ORDER — ALBUTEROL SULFATE (5 MG/ML) 0.5% IN NEBU: 5.0000 mg | INHALATION_SOLUTION | Freq: Once | RESPIRATORY_TRACT | Status: DC

## 2013-02-03 MED ORDER — IPRATROPIUM BROMIDE 0.02 % IN SOLN: 0.5000 mg | Freq: Once | RESPIRATORY_TRACT | Status: DC

## 2013-02-03 NOTE — ED Notes (Signed)
Patient c/o shortness of breath. Patient states "I need a breather that's all." Denies any pain. Patient seen here many times for same reason.

## 2013-02-03 NOTE — ED Notes (Signed)
Patient ambulatory to room with steady gait. NO distress. Respirations even and unlabored.

## 2013-02-03 NOTE — ED Notes (Addendum)
Patient left AMA, refusing to sign paperwork. "I ain't waiting no longer" Left in no distress. Respirations remained even and unlabored throughout stay in ED.

## 2013-02-03 NOTE — ED Provider Notes (Signed)
History     CSN: [id number]  Arrival date & time 02/03/13  1442   First MD Initiated Contact with Patient 02/03/13 1534      Chief Complaint  Patient presents with  . Shortness of Breath     HPI Pt was seen at 1535.   Per pt, c/o gradual onset and persistence of constant SOB and wheezing that began PTA.  Pt states he "just needs a breathing treatment." Denies CP/palpitations, no cough, no back pain, no abd pain, no N/V/D, no fevers, no rash. The symptoms have been associated with no other complaints. The patient has a significant history of similar symptoms previously, recently being evaluated for this complaint and multiple prior evals for same, with 69 ED evals in the past 6 months.      Past Medical History  Diagnosis Date  . COPD (chronic obstructive pulmonary disease)   . Anxiety   . Alcohol abuse   . Anemia   . Generalized headaches   . Hypertension   . 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 Pulmonary nodule/lesion, solitary   . Schizoaffective disorder   . GI bleed     History of recurrent bleeding that dates back as far as 2004 per records,    Past Surgical History  Procedure Laterality Date  . Colonoscopy  Sept 2009    SLF: few small AVMs in ascending colon distal to IC . Ablated via BICAP.   Marland Kitchen Esophagogastroduodenoscopy  Sept 2009    SLF: normal esophagus, small HH, 1-2 AVMs actively oozing in mid stomach, s/p BICAP, path benign   . Esophagogastroduodenoscopy  01/18/2012    Rourk-Erosive reflux esophagitis. Noncritical ring. Antral erosions/ small antral ulcer - appeared innocent  (H pylori serrology negative)  . Colonoscopy  03/09/2012    Procedure: COLONOSCOPY;  Surgeon: West Bali, MD;  Location: [facility];  Service: Endoscopy;  Laterality: N/A;  2:30  . Esophagogastroduodenoscopy  03/09/2012    Procedure: ESOPHAGOGASTRODUODENOSCOPY (EGD);  Surgeon: West Bali, MD;  Location: [facility];   Service: Endoscopy;  Laterality: N/A;    Family History  Problem Relation Age of Onset  . Colon cancer Neg Hx     History  Substance Use Topics  . Smoking status: Former Smoker    Types: Cigarettes    Quit date: 05/07/1969  . Smokeless tobacco: Current User    Types: Snuff  . Alcohol Use: 2.4 oz/week    4 Cans of beer per week     Comment: last drank today 01/12/13      Review of Systems ROS: Statement: All systems negative except as marked or noted in the HPI; Constitutional: Negative for fever and chills. ; ; Eyes: Negative for eye pain, redness and discharge. ; ; ENMT: Negative for ear pain, hoarseness, nasal congestion, sinus pressure and sore throat. ; ; Cardiovascular: +SOB. Negative for chest pain, palpitations, diaphoresis, and peripheral edema. ; ; Respiratory: Negative for cough, wheezing and stridor. ; ; Gastrointestinal: Negative for nausea, vomiting, diarrhea, abdominal pain, blood in stool, hematemesis, jaundice and rectal bleeding. . ; ; Genitourinary: Negative for dysuria, flank pain and hematuria. ; ; Musculoskeletal: Negative for back pain and neck pain. Negative for swelling and trauma.; ; Skin: Negative for pruritus, rash, abrasions, blisters, bruising and skin lesion.; ; Neuro: Negative for headache, lightheadedness and neck stiffness. Negative for weakness, altered level of consciousness , altered mental status,  extremity weakness, paresthesias, involuntary movement, seizure and syncope.       Allergies  Black pepper  Home Medications   Current Outpatient Rx  Name  Route  Sig  Dispense  Refill  . acetaminophen (TYLENOL) 500 MG tablet   Oral   Take 500 mg by mouth daily as needed for pain.         Marland Kitchen albuterol (PROAIR HFA) 108 (90 BASE) MCG/ACT inhaler   Inhalation   Inhale 2 puffs into the lungs every 6 (six) hours as needed for wheezing or shortness of breath.         Marland Kitchen albuterol (PROVENTIL HFA;VENTOLIN HFA) 108 (90 BASE) MCG/ACT inhaler    Inhalation   Inhale 1-2 puffs into the lungs every 6 (six) hours as needed for wheezing.   1 Inhaler   0   . albuterol (PROVENTIL) (2.5 MG/3ML) 0.083% nebulizer solution   Nebulization   Take 2.5 mg by nebulization every 6 (six) hours as needed for wheezing or shortness of breath.         . Aspirin-Salicylamide-Caffeine (BC HEADACHE) 325-95-16 MG TABS   Oral   Take 1 packet by mouth once as needed (for pain).         . diazepam (VALIUM) 5 MG tablet   Oral   Take 5 mg by mouth 3 (three) times daily.         Marland Kitchen HYDROcodone-acetaminophen (NORCO/VICODIN) 5-325 MG per tablet   Oral   Take 1 tablet by mouth 4 (four) times daily.           BP 102/60  Pulse 97  Temp(Src) 97.5 F (36.4 C) (Oral)  Resp 16  Wt 110 lb 12.8 oz (50.259 kg)  BMI 18.44 kg/m2  SpO2 94%  Physical Exam 1540: Physical examination:  Nursing notes reviewed; Vital signs and O2 SAT reviewed;  Constitutional: Well developed, Well nourished, Well hydrated, In no acute distress; Head:  Normocephalic, atraumatic; Eyes: EOMI, PERRL, No scleral icterus; ENMT: Mouth and pharynx normal, Mucous membranes moist; Neck: Supple, Full range of motion, No lymphadenopathy; Cardiovascular: Regular rate and rhythm, No gallop; Respiratory: Breath sounds clear & equal bilaterally, No rales, rhonchi, wheezes. Screaming full sentences at the top of his voice without distress, Normal respiratory effort/excursion; Chest: Nontender, Movement normal; Abdomen: Soft, Nontender, Nondistended, Normal bowel sounds;; Extremities: Pulses normal, No tenderness, No edema, No calf edema or asymmetry.; Neuro: AA&Ox3, Major CN grossly intact.  Speech clear. Climbs on and off stretcher easily by himself. Gait steady. No gross focal motor or sensory deficits in extremities.; Skin: Color normal, Warm, Dry.   ED Course  Procedures      MDM  MDM Reviewed: nursing note, vitals and previous chart    1545:  Pt now screaming at ED staff in the  hallway: "If I can't get a breathing treatment right now I ain't waiting, my black ass is leaving and not coming back," and pt walked out of the ED.        Laray Anger, DO 02/04/13 1448

## 2013-02-08 ENCOUNTER — Emergency Department (HOSPITAL_COMMUNITY)
Admission: EM | Admit: 2013-02-08 | Discharge: 2013-02-08 | Disposition: A | Discharge status: Home / Self Care | Payer: PRIVATE HEALTH INSURANCE | Attending: Emergency Medicine | Admitting: Emergency Medicine

## 2013-02-08 ENCOUNTER — Encounter (HOSPITAL_COMMUNITY): Payer: Self-pay | Admitting: *Deleted

## 2013-02-08 DIAGNOSIS — J449 Chronic obstructive pulmonary disease, unspecified: Secondary | ICD-10-CM

## 2013-02-08 DIAGNOSIS — Z87738 Personal history of other specified (corrected) congenital malformations of digestive system: Secondary | ICD-10-CM | POA: Insufficient documentation

## 2013-02-08 DIAGNOSIS — I10 Essential (primary) hypertension: Secondary | ICD-10-CM | POA: Insufficient documentation

## 2013-02-08 DIAGNOSIS — J441 Chronic obstructive pulmonary disease with (acute) exacerbation: Secondary | ICD-10-CM | POA: Insufficient documentation

## 2013-02-08 DIAGNOSIS — Z8659 Personal history of other mental and behavioral disorders: Secondary | ICD-10-CM | POA: Insufficient documentation

## 2013-02-08 DIAGNOSIS — F411 Generalized anxiety disorder: Secondary | ICD-10-CM | POA: Insufficient documentation

## 2013-02-08 DIAGNOSIS — Z862 Personal history of diseases of the blood and blood-forming organs and certain disorders involving the immune mechanism: Secondary | ICD-10-CM | POA: Insufficient documentation

## 2013-02-08 DIAGNOSIS — Z87891 Personal history of nicotine dependence: Secondary | ICD-10-CM | POA: Insufficient documentation

## 2013-02-08 DIAGNOSIS — Z8719 Personal history of other diseases of the digestive system: Secondary | ICD-10-CM | POA: Insufficient documentation

## 2013-02-08 DIAGNOSIS — Z8709 Personal history of other diseases of the respiratory system: Secondary | ICD-10-CM | POA: Insufficient documentation

## 2013-02-08 DIAGNOSIS — Z79899 Other long term (current) drug therapy: Secondary | ICD-10-CM | POA: Insufficient documentation

## 2013-02-08 MED ORDER — ALBUTEROL SULFATE HFA 108 (90 BASE) MCG/ACT IN AERS: 1.0000 | INHALATION_SPRAY | Freq: Four times a day (QID) | RESPIRATORY_TRACT | Status: DC | PRN

## 2013-02-08 NOTE — ED Notes (Signed)
Reports shortness of breath since this AM; no dyspnea on exertion - pt ambulated from waiting room to tx area, gait steady, in no distress.  Requesting an inhaler and neb tx.

## 2013-02-08 NOTE — ED Provider Notes (Signed)
History     CSN: [id number]  Arrival date & time 02/08/13  1420   First MD Initiated Contact with Patient 02/08/13 1432      Chief Complaint  Patient presents with  . Shortness of Breath     Patient is a 79 y.o. male presenting with shortness of breath. The history is provided by the patient.  Shortness of Breath Severity:  Mild Onset quality:  Gradual Duration: unknown time. Timing:  Constant Progression:  Unchanged Chronicity:  Chronic Relieved by:  Nothing Worsened by:  Nothing tried Associated symptoms: no chest pain and no fever     Past Medical History  Diagnosis Date  . COPD (chronic obstructive pulmonary disease)   . Anxiety   . Alcohol abuse   . Anemia   . Generalized headaches   . Hypertension   . 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 Pulmonary nodule/lesion, solitary   . Schizoaffective disorder   . GI bleed     History of recurrent bleeding that dates back as far as 2004 per records,    Past Surgical History  Procedure Laterality Date  . Colonoscopy  Sept 2009    SLF: few small AVMs in ascending colon distal to IC . Ablated via BICAP.   Marland Kitchen Esophagogastroduodenoscopy  Sept 2009    SLF: normal esophagus, small HH, 1-2 AVMs actively oozing in mid stomach, s/p BICAP, path benign   . Esophagogastroduodenoscopy  01/18/2012    Rourk-Erosive reflux esophagitis. Noncritical ring. Antral erosions/ small antral ulcer - appeared innocent  (H pylori serrology negative)  . Colonoscopy  03/09/2012    Procedure: COLONOSCOPY;  Surgeon: West Bali, MD;  Location: [facility];  Service: Endoscopy;  Laterality: N/A;  2:30  . Esophagogastroduodenoscopy  03/09/2012    Procedure: ESOPHAGOGASTRODUODENOSCOPY (EGD);  Surgeon: West Bali, MD;  Location: [facility];  Service: Endoscopy;  Laterality: N/A;    Family History  Problem Relation Age of Onset  . Colon cancer Neg Hx     History  Substance Use  Topics  . Smoking status: Former Smoker    Types: Cigarettes    Quit date: 05/07/1969  . Smokeless tobacco: Current User    Types: Snuff  . Alcohol Use: 2.4 oz/week    4 Cans of beer per week     Comment: last drank today 01/12/13      Review of Systems  Constitutional: Negative for fever.  Respiratory: Positive for shortness of breath.   Cardiovascular: Negative for chest pain.    Allergies  Black pepper  Home Medications   Current Outpatient Rx  Name  Route  Sig  Dispense  Refill  . acetaminophen (TYLENOL) 500 MG tablet   Oral   Take 500 mg by mouth daily as needed for pain.         Marland Kitchen albuterol (PROAIR HFA) 108 (90 BASE) MCG/ACT inhaler   Inhalation   Inhale 2 puffs into the lungs every 6 (six) hours as needed for wheezing or shortness of breath.         Marland Kitchen albuterol (PROVENTIL HFA;VENTOLIN HFA) 108 (90 BASE) MCG/ACT inhaler   Inhalation   Inhale 1-2 puffs into the lungs every 6 (six) hours as needed for wheezing.   1 Inhaler   0   . albuterol (PROVENTIL HFA;VENTOLIN HFA) 108 (90 BASE) MCG/ACT inhaler   Inhalation   Inhale 1-2 puffs into the lungs  every 6 (six) hours as needed for wheezing.   1 Inhaler   0   . albuterol (PROVENTIL) (2.5 MG/3ML) 0.083% nebulizer solution   Nebulization   Take 2.5 mg by nebulization every 6 (six) hours as needed for wheezing or shortness of breath.         . Aspirin-Salicylamide-Caffeine (BC HEADACHE) 325-95-16 MG TABS   Oral   Take 1 packet by mouth once as needed (for pain).         . diazepam (VALIUM) 5 MG tablet   Oral   Take 5 mg by mouth 3 (three) times daily.         Marland Kitchen HYDROcodone-acetaminophen (NORCO/VICODIN) 5-325 MG per tablet   Oral   Take 1 tablet by mouth 4 (four) times daily.           BP 130/74  Pulse 99  Temp(Src) 98.2 F (36.8 C) (Oral)  Wt 110 lb (49.896 kg)  BMI 18.31 kg/m2  SpO2 92%  Physical Exam CONSTITUTIONAL: Well developed/well nourished HEAD: Normocephalic/atraumatic EYES:  EOMI/PERRL, conjunctiva pink ENMT: Mucous membranes moist NECK: supple no meningeal signs CV: S1/S2 noted, no murmurs/rubs/gallops noted LUNGS: scattered wheezes noted.  No distress noted ABDOMEN: soft, nontender, no rebound or guarding NEURO: Pt is awake/alert, moves all extremitiesx4 EXTREMITIES:full ROM SKIN: warm, color normal PSYCH: no abnormalities of mood noted  ED Course  Procedures   1. COPD (chronic obstructive pulmonary disease)       MDM  Nursing notes including past medical history and social history reviewed and considered in documentation         Joya Gaskins, MD 02/08/13 1516

## 2013-02-11 ENCOUNTER — Emergency Department (HOSPITAL_COMMUNITY)
Admission: EM | Admit: 2013-02-11 | Discharge: 2013-02-11 | Disposition: A | Discharge status: Home / Self Care | Payer: PRIVATE HEALTH INSURANCE | Attending: Emergency Medicine | Admitting: Emergency Medicine

## 2013-02-11 DIAGNOSIS — Z79899 Other long term (current) drug therapy: Secondary | ICD-10-CM | POA: Insufficient documentation

## 2013-02-11 DIAGNOSIS — F411 Generalized anxiety disorder: Secondary | ICD-10-CM | POA: Insufficient documentation

## 2013-02-11 DIAGNOSIS — Z862 Personal history of diseases of the blood and blood-forming organs and certain disorders involving the immune mechanism: Secondary | ICD-10-CM | POA: Insufficient documentation

## 2013-02-11 DIAGNOSIS — F259 Schizoaffective disorder, unspecified: Secondary | ICD-10-CM | POA: Insufficient documentation

## 2013-02-11 DIAGNOSIS — Z87891 Personal history of nicotine dependence: Secondary | ICD-10-CM | POA: Insufficient documentation

## 2013-02-11 DIAGNOSIS — R0602 Shortness of breath: Secondary | ICD-10-CM | POA: Insufficient documentation

## 2013-02-11 DIAGNOSIS — J441 Chronic obstructive pulmonary disease with (acute) exacerbation: Secondary | ICD-10-CM | POA: Insufficient documentation

## 2013-02-11 DIAGNOSIS — Z8719 Personal history of other diseases of the digestive system: Secondary | ICD-10-CM | POA: Insufficient documentation

## 2013-02-11 DIAGNOSIS — J449 Chronic obstructive pulmonary disease, unspecified: Secondary | ICD-10-CM

## 2013-02-11 DIAGNOSIS — I10 Essential (primary) hypertension: Secondary | ICD-10-CM | POA: Insufficient documentation

## 2013-02-11 MED ORDER — ALBUTEROL SULFATE HFA 108 (90 BASE) MCG/ACT IN AERS
2.0000 | INHALATION_SPRAY | RESPIRATORY_TRACT | Status: DC | PRN
  Administered 2013-02-11: 2 via RESPIRATORY_TRACT
  Filled 2013-02-11: qty 6.7

## 2013-02-11 NOTE — ED Provider Notes (Signed)
History     CSN: [id number]  Arrival date & time 02/11/13  1759   First MD Initiated Contact with Patient 02/11/13 1802      No chief complaint on file.   (Consider location/radiation/quality/duration/timing/severity/associated sxs/prior treatment) HPI Comments: Patient is an 79 year old male presents to the ER for difficulty breathing. Patient is well-known to the ER for frequent, multiple visits per week, for similar symptoms. Patient has a history of COPD. He reports that he has had increased difficulty breathing today without fever or cough. No chest pain.   Past Medical History  Diagnosis Date  . COPD (chronic obstructive pulmonary disease)   . Anxiety   . Alcohol abuse   . Anemia   . Generalized headaches   . Hypertension   . 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 Pulmonary nodule/lesion, solitary   . Schizoaffective disorder   . GI bleed     History of recurrent bleeding that dates back as far as 2004 per records,    Past Surgical History  Procedure Laterality Date  . Colonoscopy  Sept 2009    SLF: few small AVMs in ascending colon distal to IC . Ablated via BICAP.   Marland Kitchen Esophagogastroduodenoscopy  Sept 2009    SLF: normal esophagus, small HH, 1-2 AVMs actively oozing in mid stomach, s/p BICAP, path benign   . Esophagogastroduodenoscopy  01/18/2012    Rourk-Erosive reflux esophagitis. Noncritical ring. Antral erosions/ small antral ulcer - appeared innocent  (H pylori serrology negative)  . Colonoscopy  03/09/2012    Procedure: COLONOSCOPY;  Surgeon: West Bali, MD;  Location: [facility];  Service: Endoscopy;  Laterality: N/A;  2:30  . Esophagogastroduodenoscopy  03/09/2012    Procedure: ESOPHAGOGASTRODUODENOSCOPY (EGD);  Surgeon: West Bali, MD;  Location: [facility];  Service: Endoscopy;  Laterality: N/A;    Family History  Problem Relation Age of Onset  . Colon cancer Neg Hx     History   Substance Use Topics  . Smoking status: Former Smoker    Types: Cigarettes    Quit date: 05/07/1969  . Smokeless tobacco: Current User    Types: Snuff  . Alcohol Use: 2.4 oz/week    4 Cans of beer per week     Comment: last drank today 01/12/13      Review of Systems  Respiratory: Positive for shortness of breath.   Cardiovascular: Negative for chest pain.  All other systems reviewed and are negative.    Allergies  Black pepper  Home Medications   Current Outpatient Rx  Name  Route  Sig  Dispense  Refill  . acetaminophen (TYLENOL) 500 MG tablet   Oral   Take 500 mg by mouth daily as needed for pain.         Marland Kitchen albuterol (PROAIR HFA) 108 (90 BASE) MCG/ACT inhaler   Inhalation   Inhale 2 puffs into the lungs every 6 (six) hours as needed for wheezing or shortness of breath.         Marland Kitchen albuterol (PROVENTIL HFA;VENTOLIN HFA) 108 (90 BASE) MCG/ACT inhaler   Inhalation   Inhale 1-2 puffs into the lungs every 6 (six) hours as needed for wheezing.   1 Inhaler   0   . albuterol (PROVENTIL HFA;VENTOLIN HFA) 108 (90 BASE) MCG/ACT inhaler   Inhalation   Inhale 1-2 puffs into the lungs every 6 (six) hours as needed for wheezing.  1 Inhaler   0   . albuterol (PROVENTIL) (2.5 MG/3ML) 0.083% nebulizer solution   Nebulization   Take 2.5 mg by nebulization every 6 (six) hours as needed for wheezing or shortness of breath.         . Aspirin-Salicylamide-Caffeine (BC HEADACHE) 325-95-16 MG TABS   Oral   Take 1 packet by mouth once as needed (for pain).         . diazepam (VALIUM) 5 MG tablet   Oral   Take 5 mg by mouth 3 (three) times daily.         Marland Kitchen HYDROcodone-acetaminophen (NORCO/VICODIN) 5-325 MG per tablet   Oral   Take 1 tablet by mouth 4 (four) times daily.           BP 150/73  Pulse 74  Resp 14  SpO2 98%  Physical Exam  Constitutional: He is oriented to person, place, and time. He appears well-developed and well-nourished. No distress.  HENT:   Head: Normocephalic and atraumatic.  Right Ear: Hearing normal.  Nose: Nose normal.  Mouth/Throat: Oropharynx is clear and moist and mucous membranes are normal.  Eyes: Conjunctivae and EOM are normal. Pupils are equal, round, and reactive to light.  Neck: Normal range of motion. Neck supple.  Cardiovascular: Normal rate, regular rhythm, S1 normal and S2 normal.  Exam reveals no gallop and no friction rub.   No murmur heard. Pulmonary/Chest: Effort normal and breath sounds normal. No respiratory distress. He exhibits no tenderness.  Abdominal: Soft. Normal appearance and bowel sounds are normal. There is no hepatosplenomegaly. There is no tenderness. There is no rebound, no guarding, no tenderness at McBurney's point and negative Murphy's sign. No hernia.  Musculoskeletal: Normal range of motion.  Neurological: He is alert and oriented to person, place, and time. He has normal strength. No cranial nerve deficit or sensory deficit. Coordination normal. GCS eye subscore is 4. GCS verbal subscore is 5. GCS motor subscore is 6.  Skin: Skin is warm, dry and intact. No rash noted. No cyanosis.  Psychiatric: He has a normal mood and affect. His speech is normal and behavior is normal. Thought content normal.    ED Course  Procedures (including critical care time)  Labs Reviewed - No data to display No results found.   Diagnosis: COPD    MDM  Patient presents to the ER for evaluation of shortness of breath.  Patient is well-known to the ER. He comes every several days for shortness of breath, stating that he is out of his albuterol. His lungs actually sound quite good today. He is brought by EMS today and has already started a nebulizer of albuterol and Atrovent. There is no active bronchospasm currently. When he finished a nebulizer, he reported that he felt better. Lungs were clear and oxygenation was normal. Patient discharged to home.        Gilda Crease, MD 02/12/13  1336

## 2013-02-11 NOTE — ED Notes (Signed)
Patient arrived via EMS. EMS states that they treated pt with 0.5mg  Atrovent and 5mg  Albuterol via mask with 10L/min of O2. EMS states that pt went from 91% O2 saturation on room air to 100% O2 saturation. States that patient was at the store when his chest felt tight, after which he walked one mile home and called the EMS because he couldn't catch his breath.

## 2013-02-11 NOTE — ED Notes (Signed)
Patient is forgetful. Poor historian.

## 2013-02-11 NOTE — ED Notes (Signed)
Patient denies pain, reports chest tightness just below his throat and extending into his right arm.

## 2013-02-11 NOTE — ED Notes (Signed)
Patient brought in on 10L/min O2 via EMS. Upon removal of O2, patient reported increase in chest tightness. O2 applied at 2L/min via nasal cannula and patient reports return to chest tightness level experienced in ambulance.

## 2013-02-13 ENCOUNTER — Encounter (HOSPITAL_COMMUNITY): Payer: Self-pay | Admitting: Emergency Medicine

## 2013-02-13 ENCOUNTER — Emergency Department (HOSPITAL_COMMUNITY)
Admission: EM | Admit: 2013-02-13 | Discharge: 2013-02-13 | Disposition: A | Discharge status: Home / Self Care | Payer: PRIVATE HEALTH INSURANCE | Attending: Emergency Medicine | Admitting: Emergency Medicine

## 2013-02-13 ENCOUNTER — Emergency Department (HOSPITAL_COMMUNITY): Payer: PRIVATE HEALTH INSURANCE

## 2013-02-13 ENCOUNTER — Encounter (HOSPITAL_COMMUNITY): Payer: Self-pay

## 2013-02-13 DIAGNOSIS — J440 Chronic obstructive pulmonary disease with acute lower respiratory infection: Secondary | ICD-10-CM | POA: Insufficient documentation

## 2013-02-13 DIAGNOSIS — Z79899 Other long term (current) drug therapy: Secondary | ICD-10-CM | POA: Insufficient documentation

## 2013-02-13 DIAGNOSIS — J449 Chronic obstructive pulmonary disease, unspecified: Secondary | ICD-10-CM

## 2013-02-13 DIAGNOSIS — Z8709 Personal history of other diseases of the respiratory system: Secondary | ICD-10-CM | POA: Insufficient documentation

## 2013-02-13 DIAGNOSIS — Z862 Personal history of diseases of the blood and blood-forming organs and certain disorders involving the immune mechanism: Secondary | ICD-10-CM | POA: Insufficient documentation

## 2013-02-13 DIAGNOSIS — F259 Schizoaffective disorder, unspecified: Secondary | ICD-10-CM | POA: Insufficient documentation

## 2013-02-13 DIAGNOSIS — I10 Essential (primary) hypertension: Secondary | ICD-10-CM | POA: Insufficient documentation

## 2013-02-13 DIAGNOSIS — Z8659 Personal history of other mental and behavioral disorders: Secondary | ICD-10-CM | POA: Insufficient documentation

## 2013-02-13 DIAGNOSIS — F1010 Alcohol abuse, uncomplicated: Secondary | ICD-10-CM | POA: Insufficient documentation

## 2013-02-13 DIAGNOSIS — F411 Generalized anxiety disorder: Secondary | ICD-10-CM | POA: Insufficient documentation

## 2013-02-13 DIAGNOSIS — Z8719 Personal history of other diseases of the digestive system: Secondary | ICD-10-CM | POA: Insufficient documentation

## 2013-02-13 DIAGNOSIS — J441 Chronic obstructive pulmonary disease with (acute) exacerbation: Secondary | ICD-10-CM | POA: Insufficient documentation

## 2013-02-13 DIAGNOSIS — J209 Acute bronchitis, unspecified: Secondary | ICD-10-CM | POA: Insufficient documentation

## 2013-02-13 DIAGNOSIS — Z8774 Personal history of (corrected) congenital malformations of heart and circulatory system: Secondary | ICD-10-CM | POA: Insufficient documentation

## 2013-02-13 DIAGNOSIS — Z87891 Personal history of nicotine dependence: Secondary | ICD-10-CM | POA: Insufficient documentation

## 2013-02-13 LAB — POCT I-STAT, CHEM 8
BUN: 9 mg/dL (ref 6–23)
Calcium, Ion: 1.23 mmol/L (ref 1.13–1.30)
HCT: 32 % — ABNORMAL LOW (ref 39.0–52.0)
Hemoglobin: 10.9 g/dL — ABNORMAL LOW (ref 13.0–17.0)
Sodium: 137 mEq/L (ref 135–145)
TCO2: 27 mmol/L (ref 0–100)

## 2013-02-13 MED ORDER — IPRATROPIUM BROMIDE 0.02 % IN SOLN
0.5000 mg | Freq: Once | RESPIRATORY_TRACT | Status: AC
  Administered 2013-02-13: 0.5 mg via RESPIRATORY_TRACT
  Filled 2013-02-13: qty 2.5

## 2013-02-13 MED ORDER — ALBUTEROL SULFATE HFA 108 (90 BASE) MCG/ACT IN AERS
2.0000 | INHALATION_SPRAY | Freq: Once | RESPIRATORY_TRACT | Status: AC
  Administered 2013-02-13: 2 via RESPIRATORY_TRACT
  Filled 2013-02-13: qty 6.7

## 2013-02-13 MED ORDER — ALBUTEROL SULFATE (5 MG/ML) 0.5% IN NEBU
5.0000 mg | INHALATION_SOLUTION | Freq: Once | RESPIRATORY_TRACT | Status: AC
  Administered 2013-02-13: 5 mg via RESPIRATORY_TRACT
  Filled 2013-02-13: qty 1

## 2013-02-13 MED ORDER — PREDNISONE 50 MG PO TABS
60.0000 mg | ORAL_TABLET | Freq: Once | ORAL | Status: AC
  Administered 2013-02-13: 60 mg via ORAL
  Filled 2013-02-13: qty 1

## 2013-02-13 NOTE — ED Notes (Signed)
Pt alert & oriented x4, stable gait. Patient  given discharge instructions, paperwork & prescription(s). Patient verbalized understanding. Pt left department w/ no further questions. 

## 2013-02-13 NOTE — ED Notes (Signed)
Pt states was SOB at home and came back to Er to get another breathing tx.

## 2013-02-13 NOTE — ED Provider Notes (Signed)
History    This chart was scribed for Glynn Octave, MD by Quintella Reichert, ED scribe.  This patient was seen in room [facility] and the patient's care was started at 7:18AM.   CSN: [id number]  Arrival date & time 02/13/13  0707   Chief Complaint  Patient presents with  . Shortness of Breath    The history is provided by the patient. No language interpreter was used.    HPI Comments: [name] is a 79 y.o. male who presents to the Emergency Department complaining of moderate, intermittent SOB that began this morning.  Pt has h/o COPD and frequently visits the ED for similar symptoms.  Pt denies productive cough, abdominal pain, chest pain, hematochezia, or fever. Pt is on methylprednisone day 2.  He also reports right shoulder and upper arm pain that is exacerbated by movement. Pt is a former smoker, but denies alcohol use.   Past Medical History  Diagnosis Date  . COPD (chronic obstructive pulmonary disease)   . Anxiety   . Alcohol abuse   . Anemia   . Generalized headaches   . Hypertension   . 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 Pulmonary nodule/lesion, solitary   . Schizoaffective disorder   . GI bleed     History of recurrent bleeding that dates back as far as 2004 per records,    Past Surgical History  Procedure Laterality Date  . Colonoscopy  Sept 2009    SLF: few small AVMs in ascending colon distal to IC . Ablated via BICAP.   Marland Kitchen Esophagogastroduodenoscopy  Sept 2009    SLF: normal esophagus, small HH, 1-2 AVMs actively oozing in mid stomach, s/p BICAP, path benign   . Esophagogastroduodenoscopy  01/18/2012    Rourk-Erosive reflux esophagitis. Noncritical ring. Antral erosions/ small antral ulcer - appeared innocent  (H pylori serrology negative)  . Colonoscopy  03/09/2012    Procedure: COLONOSCOPY;  Surgeon: West Bali, MD;  Location: [facility];  Service: Endoscopy;  Laterality: N/A;  2:30   . Esophagogastroduodenoscopy  03/09/2012    Procedure: ESOPHAGOGASTRODUODENOSCOPY (EGD);  Surgeon: West Bali, MD;  Location: [facility];  Service: Endoscopy;  Laterality: N/A;    Family History  Problem Relation Age of Onset  . Colon cancer Neg Hx     History  Substance Use Topics  . Smoking status: Former Smoker    Types: Cigarettes    Quit date: 05/07/1969  . Smokeless tobacco: Current User    Types: Snuff  . Alcohol Use: No    Review of Systems A complete 10 system review of systems was obtained and all systems are negative except as noted in the HPI and PMH.    Allergies  Black pepper  Home Medications   Current Outpatient Rx  Name  Route  Sig  Dispense  Refill  . acetaminophen (TYLENOL) 500 MG tablet   Oral   Take 500 mg by mouth daily as needed for pain.         Marland Kitchen albuterol (PROAIR HFA) 108 (90 BASE) MCG/ACT inhaler   Inhalation   Inhale 2 puffs into the lungs every 6 (six) hours as needed for wheezing or shortness of breath.         Marland Kitchen albuterol (PROVENTIL HFA;VENTOLIN HFA) 108 (90 BASE) MCG/ACT inhaler   Inhalation   Inhale 1-2 puffs into the lungs every 6 (six) hours as needed  for wheezing.   1 Inhaler   0   . albuterol (PROVENTIL) (2.5 MG/3ML) 0.083% nebulizer solution   Nebulization   Take 2.5 mg by nebulization every 6 (six) hours as needed for wheezing or shortness of breath.         . Aspirin-Salicylamide-Caffeine (BC HEADACHE) 325-95-16 MG TABS   Oral   Take 1 packet by mouth once as needed (for pain).         . diazepam (VALIUM) 5 MG tablet   Oral   Take 5 mg by mouth 3 (three) times daily.         Marland Kitchen HYDROcodone-acetaminophen (NORCO/VICODIN) 5-325 MG per tablet   Oral   Take 1 tablet by mouth 4 (four) times daily.           BP 119/58  Pulse 98  Temp(Src) 97.7 F (36.5 C) (Oral)  Resp 18  SpO2 93%  Physical Exam  Nursing note and vitals reviewed. Constitutional: He is oriented to person, place, and time. He  appears well-developed and well-nourished. No distress.  HENT:  Head: Normocephalic and atraumatic.  Mouth/Throat: Oropharynx is clear and moist.  Eyes: Conjunctivae and EOM are normal. Pupils are equal, round, and reactive to light.  Neck: Normal range of motion. Neck supple.  Cardiovascular: Normal rate and regular rhythm.   No murmur heard. Pulmonary/Chest: Effort normal. No respiratory distress. He has wheezes (expiratory wheezing bilaterally w/ dry cough).  Breathing appears at baseline  Abdominal: Soft. There is no tenderness.  Musculoskeletal: He exhibits no edema (No lower extremity edema).  Reduced ROM of right shoulder, radial pulse intact  Neurological: He is alert and oriented to person, place, and time.  Skin: Skin is warm and dry.  Psychiatric: He has a normal mood and affect. His behavior is normal.    ED Course  Procedures (including critical care time)  DIAGNOSTIC STUDIES: Oxygen Saturation is 93% on room air, adequate by my interpretation.    COORDINATION OF CARE: 7:21 AM-Discussed treatment plan which includes breathing treatment, imaging, EKG, and labs with pt at bedside and pt agreed to plan.   8:34 AM- On recheck, pt feels better. Good air exchange with occasional wheeze.  8:48 AM- Patient is ambulatory in the ED without desatting.  Labs Reviewed  POCT I-STAT, CHEM 8 - Abnormal; Notable for the following:    Glucose, Bld 104 (*)    Hemoglobin 10.9 (*)    HCT 32.0 (*)    All other components within normal limits    Dg Chest 2 View  02/13/2013  *RADIOLOGY REPORT*  Clinical Data: Shortness of breath.  Right shoulder pain.  CHEST - 2 VIEW  Comparison: Plain film of the chest 01/20/2013 09/18/2012.  CT chest 09/12/2012.  Findings: The lungs are emphysematous but clear.  No pneumothorax or pleural effusion.  Heart size normal.  No focal bony abnormality.  IMPRESSION: Emphysema without acute disease.   Original Report Authenticated By: Holley Dexter, M.D.       1. COPD (chronic obstructive pulmonary disease)       MDM  Shortness of breath since this morning typical of previous visits. Denies chest pain, fever. Endorses dry cough. Complains of chronic right shoulder pain.  Nebulizer given.  Patient asking for water to take today's steroid dose which was given. CXR clear. EKG unchanged.  Hemoglobin at baseline.  Ambulatory without desaturation below 92%.  Breathing at baseline, carrying on full conversation. Stable for discharge.    Date: 02/13/2013  Rate: 75  Rhythm: normal sinus rhythm  QRS Axis: normal  Intervals: normal  ST/T Wave abnormalities: normal  Conduction Disutrbances:none  Narrative Interpretation:   Old EKG Reviewed: unchanged   I personally performed the services described in this documentation, which was scribed in my presence. The recorded information has been reviewed and is accurate.    Glynn Octave, MD 02/13/13 [phone]

## 2013-02-13 NOTE — ED Notes (Signed)
Pt states he has been feeling sob since 1800 today.

## 2013-02-13 NOTE — ED Provider Notes (Signed)
History    This chart was scribed for [name], MD by Marlyne Beards, ED Scribe. The patient was seen in room [facility]04/[facility]04. Patient's care was started at 7:10 PM.    CSN: [id number]  Arrival date & time 02/13/13  8295   First MD Initiated Contact with Patient 02/13/13 1910      Chief Complaint  Patient presents with  . Shortness of Breath    (Consider location/radiation/quality/duration/timing/severity/associated sxs/prior treatment) Patient is a 79 y.o. male presenting with shortness of breath. The history is provided by the patient. No language interpreter was used.  Shortness of Breath Severity:  Moderate Onset quality:  Gradual Timing:  Constant Progression:  Worsening Chronicity:  Recurrent Relieved by:  Nothing Associated symptoms: no abdominal pain, no chest pain, no cough, no headaches and no rash    [name] is a 79 y.o. male with h/o of COPD and anxiety who presents to the Emergency Department complaining of moderate constant SOB onset earlier this morning. Pt was here this morning for his SOB and states it has gotten worse since his last visit this morning. Pt denies fever, chills, cough, nausea, vomiting, diarrhea, weakness, and any other associated symptoms. Pt's current PCP is Dr. Juanetta Gosling.   Past Medical History  Diagnosis Date  . COPD (chronic obstructive pulmonary disease)   . Anxiety   . Alcohol abuse   . Anemia   . Generalized headaches   . Hypertension   . 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 Pulmonary nodule/lesion, solitary   . Schizoaffective disorder   . GI bleed     History of recurrent bleeding that dates back as far as 2004 per records,    Past Surgical History  Procedure Laterality Date  . Colonoscopy  Sept 2009    SLF: few small AVMs in ascending colon distal to IC . Ablated via BICAP.   Marland Kitchen Esophagogastroduodenoscopy  Sept 2009    SLF: normal esophagus, small HH, 1-2 AVMs  actively oozing in mid stomach, s/p BICAP, path benign   . Esophagogastroduodenoscopy  01/18/2012    Rourk-Erosive reflux esophagitis. Noncritical ring. Antral erosions/ small antral ulcer - appeared innocent  (H pylori serrology negative)  . Colonoscopy  03/09/2012    Procedure: COLONOSCOPY;  Surgeon: West Bali, MD;  Location: [facility];  Service: Endoscopy;  Laterality: N/A;  2:30  . Esophagogastroduodenoscopy  03/09/2012    Procedure: ESOPHAGOGASTRODUODENOSCOPY (EGD);  Surgeon: West Bali, MD;  Location: [facility];  Service: Endoscopy;  Laterality: N/A;    Family History  Problem Relation Age of Onset  . Colon cancer Neg Hx     History  Substance Use Topics  . Smoking status: Former Smoker    Types: Cigarettes    Quit date: 05/07/1969  . Smokeless tobacco: Current User    Types: Snuff  . Alcohol Use: No      Review of Systems  Constitutional: Negative for appetite change and fatigue.  HENT: Negative for congestion, sinus pressure and ear discharge.   Eyes: Negative for discharge.  Respiratory: Positive for shortness of breath. Negative for cough.   Cardiovascular: Negative for chest pain.  Gastrointestinal: Negative for abdominal pain and diarrhea.  Genitourinary: Negative for frequency and hematuria.  Musculoskeletal: Negative for back pain.  Skin: Negative for rash.  Neurological: Negative for seizures and headaches.  Psychiatric/Behavioral: Negative for hallucinations.    Allergies  Black pepper  Home Medications   Current Outpatient Rx  Name  Route  Sig  Dispense  Refill  . acetaminophen (TYLENOL) 500 MG tablet   Oral   Take 500 mg by mouth daily as needed for pain.         Marland Kitchen albuterol (PROAIR HFA) 108 (90 BASE) MCG/ACT inhaler   Inhalation   Inhale 2 puffs into the lungs every 6 (six) hours as needed for wheezing or shortness of breath.         Marland Kitchen albuterol (PROVENTIL HFA;VENTOLIN HFA) 108 (90 BASE) MCG/ACT inhaler   Inhalation    Inhale 1-2 puffs into the lungs every 6 (six) hours as needed for wheezing.   1 Inhaler   0   . albuterol (PROVENTIL) (2.5 MG/3ML) 0.083% nebulizer solution   Nebulization   Take 2.5 mg by nebulization every 6 (six) hours as needed for wheezing or shortness of breath.         . Aspirin-Salicylamide-Caffeine (BC HEADACHE) 325-95-16 MG TABS   Oral   Take 1 packet by mouth once as needed (for pain).         . diazepam (VALIUM) 5 MG tablet   Oral   Take 5 mg by mouth 3 (three) times daily.         Marland Kitchen HYDROcodone-acetaminophen (NORCO/VICODIN) 5-325 MG per tablet   Oral   Take 1 tablet by mouth 4 (four) times daily.           BP 130/70  Pulse 106  Temp(Src) 97.1 F (36.2 C) (Oral)  Resp 20  Wt 110 lb (49.896 kg)  BMI 18.31 kg/m2  SpO2 94%  Physical Exam  Constitutional: He is oriented to person, place, and time. He appears well-developed.  HENT:  Head: Normocephalic.  Eyes: Conjunctivae and EOM are normal. No scleral icterus.  Neck: Neck supple. No thyromegaly present.  Cardiovascular: Normal rate and regular rhythm.  Exam reveals no gallop and no friction rub.   No murmur heard. Pulmonary/Chest: No stridor. He has wheezes. He has no rales. He exhibits no tenderness.  Minimal wheezing bilaterally   Abdominal: He exhibits no distension. There is no tenderness. There is no rebound.  Musculoskeletal: Normal range of motion. He exhibits no edema.  Lymphadenopathy:    He has no cervical adenopathy.  Neurological: He is oriented to person, place, and time. Coordination normal.  Skin: No rash noted. No erythema.  Psychiatric: He has a normal mood and affect. His behavior is normal.    ED Course  Procedures (including critical care time) DIAGNOSTIC STUDIES: Oxygen Saturation is 94% on room air, adequate by my interpretation.    COORDINATION OF CARE: 7:18 PM Discussed ED treatment with pt and pt agrees.    Labs Reviewed - No data to display Dg Chest 2  View  02/13/2013  *RADIOLOGY REPORT*  Clinical Data: Shortness of breath.  Right shoulder pain.  CHEST - 2 VIEW  Comparison: Plain film of the chest 01/20/2013 09/18/2012.  CT chest 09/12/2012.  Findings: The lungs are emphysematous but clear.  No pneumothorax or pleural effusion.  Heart size normal.  No focal bony abnormality.  IMPRESSION: Emphysema without acute disease.   Original Report Authenticated By: Holley Dexter, M.D.      No diagnosis found.  Pt improved with tx  MDM   The chart was scribed for me under my direct supervision.  I personally performed the history, physical, and medical decision making and all procedures in the evaluation of this patient.Marland Kitchen  [name], MD 02/13/13 [phone]

## 2013-02-13 NOTE — ED Notes (Signed)
Pt provided a drink at this time.  

## 2013-02-13 NOTE — ED Notes (Signed)
Patient given a drink upon request. Nurse notified

## 2013-02-13 NOTE — ED Notes (Signed)
Pt c/o shortness of breath since "early this morning."

## 2013-02-13 NOTE — ED Notes (Signed)
Pt states he is ready to go if he is not going to get a breathing tx. EDP notified. Pt instructed to speak w/ his PCP about possibly getting a neb machine. Pt then pulled albuterol neb tube from pocket & asked one like this.

## 2013-02-14 ENCOUNTER — Emergency Department (HOSPITAL_COMMUNITY)
Admission: EM | Admit: 2013-02-14 | Discharge: 2013-02-14 | Disposition: A | Discharge status: Home / Self Care | Payer: PRIVATE HEALTH INSURANCE | Attending: Emergency Medicine | Admitting: Emergency Medicine

## 2013-02-14 ENCOUNTER — Encounter (HOSPITAL_COMMUNITY): Payer: Self-pay | Admitting: *Deleted

## 2013-02-14 DIAGNOSIS — J449 Chronic obstructive pulmonary disease, unspecified: Secondary | ICD-10-CM

## 2013-02-14 DIAGNOSIS — Z862 Personal history of diseases of the blood and blood-forming organs and certain disorders involving the immune mechanism: Secondary | ICD-10-CM | POA: Insufficient documentation

## 2013-02-14 DIAGNOSIS — J440 Chronic obstructive pulmonary disease with acute lower respiratory infection: Secondary | ICD-10-CM | POA: Insufficient documentation

## 2013-02-14 DIAGNOSIS — Z79899 Other long term (current) drug therapy: Secondary | ICD-10-CM | POA: Insufficient documentation

## 2013-02-14 DIAGNOSIS — Z87891 Personal history of nicotine dependence: Secondary | ICD-10-CM | POA: Insufficient documentation

## 2013-02-14 DIAGNOSIS — Z8709 Personal history of other diseases of the respiratory system: Secondary | ICD-10-CM | POA: Insufficient documentation

## 2013-02-14 DIAGNOSIS — Z8679 Personal history of other diseases of the circulatory system: Secondary | ICD-10-CM | POA: Insufficient documentation

## 2013-02-14 DIAGNOSIS — I10 Essential (primary) hypertension: Secondary | ICD-10-CM | POA: Insufficient documentation

## 2013-02-14 DIAGNOSIS — Z8659 Personal history of other mental and behavioral disorders: Secondary | ICD-10-CM | POA: Insufficient documentation

## 2013-02-14 DIAGNOSIS — Z8739 Personal history of other diseases of the musculoskeletal system and connective tissue: Secondary | ICD-10-CM | POA: Insufficient documentation

## 2013-02-14 DIAGNOSIS — J209 Acute bronchitis, unspecified: Secondary | ICD-10-CM | POA: Insufficient documentation

## 2013-02-14 DIAGNOSIS — Z87738 Personal history of other specified (corrected) congenital malformations of digestive system: Secondary | ICD-10-CM | POA: Insufficient documentation

## 2013-02-14 DIAGNOSIS — F411 Generalized anxiety disorder: Secondary | ICD-10-CM | POA: Insufficient documentation

## 2013-02-14 MED ORDER — IPRATROPIUM BROMIDE 0.02 % IN SOLN
0.5000 mg | Freq: Once | RESPIRATORY_TRACT | Status: AC
  Administered 2013-02-14: 0.5 mg via RESPIRATORY_TRACT
  Filled 2013-02-14: qty 2.5

## 2013-02-14 MED ORDER — ALBUTEROL SULFATE (5 MG/ML) 0.5% IN NEBU
5.0000 mg | INHALATION_SOLUTION | Freq: Once | RESPIRATORY_TRACT | Status: AC
  Administered 2013-02-14: 5 mg via RESPIRATORY_TRACT
  Filled 2013-02-14: qty 1

## 2013-02-14 NOTE — ED Notes (Signed)
Pt alert & oriented x4, stable gait. Patient given discharge instructions, paperwork & prescription(s). Patient  instructed to stop at the registration desk to finish any additional paperwork. Patient verbalized understanding. Pt left department w/ no further questions. 

## 2013-02-14 NOTE — ED Notes (Signed)
Respiratory paged for a breathing treatment.

## 2013-02-14 NOTE — ED Provider Notes (Signed)
History     CSN: [id number]  Arrival date & time 02/14/13  4098   First MD Initiated Contact with Patient 02/14/13 [phone]      Chief Complaint  Patient presents with  . Shortness of Breath    The history is provided by the patient and medical records.   the patient reports difficulty breathing over the past 24 hours.  He has a long-standing history of COPD and multiple ER visits for similar symptoms.  He's been seen twice are in the past 24 hours.  He started on prednisone that was started 2 visits ago.  He states he has been taking this at home.  He also does breathing treatment at home without improvement in symptoms and thus he presented emergency department.  He denies orthopnea or significant dyspnea on exertion.  No chest pain.  Chest x-ray from earlier today demonstrates no acute abnormalities.  He has a history of anxiety and prior alcohol abuse.  His symptoms are mild in severity.  He no longer smokes cigarettes.  This is the patient's 67th visit to the ER in the past 6 months   Past Medical History  Diagnosis Date  . COPD (chronic obstructive pulmonary disease)   . Anxiety   . Alcohol abuse   . Anemia   . Generalized headaches   . Hypertension   . 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 Pulmonary nodule/lesion, solitary   . Schizoaffective disorder   . GI bleed     History of recurrent bleeding that dates back as far as 2004 per records,    Past Surgical History  Procedure Laterality Date  . Colonoscopy  Sept 2009    SLF: few small AVMs in ascending colon distal to IC . Ablated via BICAP.   Marland Kitchen Esophagogastroduodenoscopy  Sept 2009    SLF: normal esophagus, small HH, 1-2 AVMs actively oozing in mid stomach, s/p BICAP, path benign   . Esophagogastroduodenoscopy  01/18/2012    Rourk-Erosive reflux esophagitis. Noncritical ring. Antral erosions/ small antral ulcer - appeared innocent  (H pylori serrology negative)  .  Colonoscopy  03/09/2012    Procedure: COLONOSCOPY;  Surgeon: West Bali, MD;  Location: [facility];  Service: Endoscopy;  Laterality: N/A;  2:30  . Esophagogastroduodenoscopy  03/09/2012    Procedure: ESOPHAGOGASTRODUODENOSCOPY (EGD);  Surgeon: West Bali, MD;  Location: [facility];  Service: Endoscopy;  Laterality: N/A;    Family History  Problem Relation Age of Onset  . Colon cancer Neg Hx     History  Substance Use Topics  . Smoking status: Former Smoker    Types: Cigarettes    Quit date: 05/07/1969  . Smokeless tobacco: Current User    Types: Snuff  . Alcohol Use: No      Review of Systems  Respiratory: Positive for shortness of breath.   All other systems reviewed and are negative.    Allergies  Black pepper  Home Medications   Current Outpatient Rx  Name  Route  Sig  Dispense  Refill  . acetaminophen (TYLENOL) 500 MG tablet   Oral   Take 500 mg by mouth daily as needed for pain.         Marland Kitchen albuterol (PROAIR HFA) 108 (90 BASE) MCG/ACT inhaler   Inhalation   Inhale 2 puffs into the lungs every 6 (six) hours as needed for wheezing or shortness of breath.         Marland Kitchen  albuterol (PROVENTIL HFA;VENTOLIN HFA) 108 (90 BASE) MCG/ACT inhaler   Inhalation   Inhale 1-2 puffs into the lungs every 6 (six) hours as needed for wheezing.   1 Inhaler   0   . albuterol (PROVENTIL) (2.5 MG/3ML) 0.083% nebulizer solution   Nebulization   Take 2.5 mg by nebulization every 6 (six) hours as needed for wheezing or shortness of breath.         . Aspirin-Salicylamide-Caffeine (BC HEADACHE) 325-95-16 MG TABS   Oral   Take 1 packet by mouth once as needed (for pain).         . diazepam (VALIUM) 5 MG tablet   Oral   Take 5 mg by mouth 3 (three) times daily.         Marland Kitchen HYDROcodone-acetaminophen (NORCO/VICODIN) 5-325 MG per tablet   Oral   Take 1 tablet by mouth 4 (four) times daily.           BP 159/83  Pulse 94  Temp(Src) 97.6 F (36.4 C) (Oral)   Resp 32  SpO2 94%  Physical Exam  Nursing note and vitals reviewed. Constitutional: He is oriented to person, place, and time. He appears well-developed and well-nourished.  HENT:  Head: Normocephalic and atraumatic.  Eyes: EOM are normal.  Neck: Normal range of motion.  Cardiovascular: Normal rate, regular rhythm, normal heart sounds and intact distal pulses.   Pulmonary/Chest: Effort normal and breath sounds normal. No respiratory distress.  Very faint wheeze noted bilaterally  Abdominal: Soft. He exhibits no distension. There is no tenderness.  Musculoskeletal: Normal range of motion.  Neurological: He is alert and oriented to person, place, and time.  Skin: Skin is warm and dry.  Psychiatric: He has a normal mood and affect. Judgment normal.    ED Course  Procedures (including critical care time)  Labs Reviewed - No data to display Dg Chest 2 View  02/13/2013  *RADIOLOGY REPORT*  Clinical Data: Shortness of breath.  Right shoulder pain.  CHEST - 2 VIEW  Comparison: Plain film of the chest 01/20/2013 09/18/2012.  CT chest 09/12/2012.  Findings: The lungs are emphysematous but clear.  No pneumothorax or pleural effusion.  Heart size normal.  No focal bony abnormality.  IMPRESSION: Emphysema without acute disease.   Original Report Authenticated By: Holley Dexter, M.D.    I personally reviewed the imaging tests through PACS system I reviewed available ER/hospitalization records through the EMR   1. COPD (chronic obstructive pulmonary disease)       MDM    4:31 AM Very faint wheezes noted patient be given a breathing treatment.  Already on prednisone at home. Overall well-appearing.  Speaking in full sentences.  5:23 AM Feels much better. Airways clear. Dc home  Lyanne Co, MD 02/14/13 [phone]

## 2013-02-14 NOTE — ED Notes (Signed)
Pt here for the 3rd time in 24 hours. Pt states he is unable to breath again. Pt able to speak in complete sentences & no wheezing noted.

## 2013-02-14 NOTE — ED Notes (Signed)
Pt states he could not breath at home again tonight. This is the 3rd time pt seen here in the ER in the last 24 hours. Pt states he did use MDI he got upon discharge earlier tonight.

## 2013-02-16 ENCOUNTER — Emergency Department (HOSPITAL_COMMUNITY)
Admission: EM | Admit: 2013-02-16 | Discharge: 2013-02-16 | Discharge status: Against Medical Advice | Payer: PRIVATE HEALTH INSURANCE

## 2013-02-16 ENCOUNTER — Encounter (HOSPITAL_COMMUNITY): Payer: Self-pay

## 2013-02-16 ENCOUNTER — Emergency Department (HOSPITAL_COMMUNITY)
Admission: EM | Admit: 2013-02-16 | Discharge: 2013-02-16 | Disposition: A | Discharge status: Home / Self Care | Payer: PRIVATE HEALTH INSURANCE | Attending: Emergency Medicine | Admitting: Emergency Medicine

## 2013-02-16 ENCOUNTER — Emergency Department (HOSPITAL_COMMUNITY): Payer: PRIVATE HEALTH INSURANCE

## 2013-02-16 DIAGNOSIS — Z79899 Other long term (current) drug therapy: Secondary | ICD-10-CM | POA: Insufficient documentation

## 2013-02-16 DIAGNOSIS — Z8709 Personal history of other diseases of the respiratory system: Secondary | ICD-10-CM | POA: Insufficient documentation

## 2013-02-16 DIAGNOSIS — J449 Chronic obstructive pulmonary disease, unspecified: Secondary | ICD-10-CM

## 2013-02-16 DIAGNOSIS — I10 Essential (primary) hypertension: Secondary | ICD-10-CM | POA: Insufficient documentation

## 2013-02-16 DIAGNOSIS — Z8659 Personal history of other mental and behavioral disorders: Secondary | ICD-10-CM | POA: Insufficient documentation

## 2013-02-16 DIAGNOSIS — Z8719 Personal history of other diseases of the digestive system: Secondary | ICD-10-CM | POA: Insufficient documentation

## 2013-02-16 DIAGNOSIS — Z87891 Personal history of nicotine dependence: Secondary | ICD-10-CM | POA: Insufficient documentation

## 2013-02-16 DIAGNOSIS — Z8774 Personal history of (corrected) congenital malformations of heart and circulatory system: Secondary | ICD-10-CM | POA: Insufficient documentation

## 2013-02-16 DIAGNOSIS — M5030 Other cervical disc degeneration, unspecified cervical region: Secondary | ICD-10-CM

## 2013-02-16 DIAGNOSIS — M5020 Other cervical disc displacement, unspecified cervical region: Secondary | ICD-10-CM | POA: Insufficient documentation

## 2013-02-16 DIAGNOSIS — J4489 Other specified chronic obstructive pulmonary disease: Secondary | ICD-10-CM | POA: Insufficient documentation

## 2013-02-16 DIAGNOSIS — M62838 Other muscle spasm: Secondary | ICD-10-CM | POA: Insufficient documentation

## 2013-02-16 DIAGNOSIS — Z862 Personal history of diseases of the blood and blood-forming organs and certain disorders involving the immune mechanism: Secondary | ICD-10-CM | POA: Insufficient documentation

## 2013-02-16 DIAGNOSIS — F411 Generalized anxiety disorder: Secondary | ICD-10-CM | POA: Insufficient documentation

## 2013-02-16 DIAGNOSIS — J441 Chronic obstructive pulmonary disease with (acute) exacerbation: Secondary | ICD-10-CM | POA: Insufficient documentation

## 2013-02-16 HISTORY — DX: Other cervical disc degeneration, unspecified cervical region: M50.30

## 2013-02-16 MED ORDER — METHOCARBAMOL 500 MG PO TABS: 500.0000 mg | ORAL_TABLET | Freq: Four times a day (QID) | ORAL | Status: AC | PRN

## 2013-02-16 MED ORDER — METHOCARBAMOL 500 MG PO TABS
750.0000 mg | ORAL_TABLET | Freq: Once | ORAL | Status: DC
  Filled 2013-02-16: qty 2

## 2013-02-16 NOTE — ED Notes (Signed)
Pt reports that he was at [employer] and started ggetting sob, was just d/c from ed.  Pt able to answer all ?'s clearly

## 2013-02-16 NOTE — ED Notes (Signed)
Dr.mcmanus was out to triage to see pt, pt given d/c instructions and refused to sign, stated "i already got some today and I don't need anymore".  Advised to follow up with his pmd on Monday. Staff offered to assist pt with a ride home and he refused. Pt walked unaided out to lobby w/ security.

## 2013-02-16 NOTE — ED Provider Notes (Signed)
History     CSN: [id number]  Arrival date & time 02/16/13  1106   First MD Initiated Contact with Patient 02/16/13 1132      Chief Complaint  Patient presents with  . Arm Pain     HPI Pt was seen at 1130.   Per pt, c/o gradual onset and persistence of constant right sided neck and shoulder "pain" that began last night.  Pt states the pain occasionally radiates down his right arm.  Pain increases with palpation of his right shoulder and neck, as well as movement of his right arm. Denies his chronic SOB is any different or worse from his usual today. Denies focal motor weakness, no tingling/numbness in extremities, no CP/palpitations, no cough, no abd pain, no N/V/D, no rash, no fevers, no injury.      Past Medical History  Diagnosis Date  . COPD (chronic obstructive pulmonary disease)   . Anxiety   . Alcohol abuse   . Anemia   . Generalized headaches   . Hypertension   . 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 Pulmonary nodule/lesion, solitary   . Schizoaffective disorder   . GI bleed     History of recurrent bleeding that dates back as far as 2004 per records,  . DDD (degenerative disc disease), cervical     Past Surgical History  Procedure Laterality Date  . Colonoscopy  Sept 2009    SLF: few small AVMs in ascending colon distal to IC . Ablated via BICAP.   Marland Kitchen Esophagogastroduodenoscopy  Sept 2009    SLF: normal esophagus, small HH, 1-2 AVMs actively oozing in mid stomach, s/p BICAP, path benign   . Esophagogastroduodenoscopy  01/18/2012    Rourk-Erosive reflux esophagitis. Noncritical ring. Antral erosions/ small antral ulcer - appeared innocent  (H pylori serrology negative)  . Colonoscopy  03/09/2012    Procedure: COLONOSCOPY;  Surgeon: West Bali, MD;  Location: [facility];  Service: Endoscopy;  Laterality: N/A;  2:30  . Esophagogastroduodenoscopy  03/09/2012    Procedure: ESOPHAGOGASTRODUODENOSCOPY (EGD);   Surgeon: West Bali, MD;  Location: [facility];  Service: Endoscopy;  Laterality: N/A;    Family History  Problem Relation Age of Onset  . Colon cancer Neg Hx     History  Substance Use Topics  . Smoking status: Former Smoker    Types: Cigarettes    Quit date: 05/07/1969  . Smokeless tobacco: Current User    Types: Snuff  . Alcohol Use: 0.0 oz/week     Comment: "everyday"      Review of Systems ROS: Statement: All systems negative except as marked or noted in the HPI; Constitutional: Negative for fever and chills. ; ; Eyes: Negative for eye pain, redness and discharge. ; ; ENMT: Negative for ear pain, hoarseness, nasal congestion, sinus pressure and sore throat. ; ; Cardiovascular: Negative for chest pain, palpitations, diaphoresis, dyspnea and peripheral edema. ; ; Respiratory: Negative for cough, wheezing and stridor. ; ; Gastrointestinal: Negative for nausea, vomiting, diarrhea, abdominal pain, blood in stool, hematemesis, jaundice and rectal bleeding. . ; ; Genitourinary: Negative for dysuria, flank pain and hematuria. ; ; Musculoskeletal: +neck/shoulder pain. Negative for back pain. Negative for swelling and trauma.; ; Skin: Negative for pruritus, rash, abrasions, blisters, bruising and skin lesion.; ; Neuro: Negative for headache, lightheadedness and neck stiffness. Negative for weakness, altered level of consciousness , altered mental status, extremity  weakness, paresthesias, involuntary movement, seizure and syncope.       Allergies  Black pepper  Home Medications   Current Outpatient Rx  Name  Route  Sig  Dispense  Refill  . acetaminophen (TYLENOL) 500 MG tablet   Oral   Take 500 mg by mouth daily as needed for pain.         Marland Kitchen albuterol (PROVENTIL HFA;VENTOLIN HFA) 108 (90 BASE) MCG/ACT inhaler   Inhalation   Inhale 2 puffs into the lungs every 6 (six) hours as needed for wheezing or shortness of breath.         Marland Kitchen albuterol (PROVENTIL) (2.5 MG/3ML) 0.083%  nebulizer solution   Nebulization   Take 2.5 mg by nebulization every 6 (six) hours as needed for wheezing or shortness of breath.         . diphenhydrAMINE (BENADRYL) 25 mg capsule   Oral   Take 25 mg by mouth every 6 (six) hours as needed for sleep.         Marland Kitchen HYDROcodone-acetaminophen (NORCO/VICODIN) 5-325 MG per tablet   Oral   Take 1 tablet by mouth 4 (four) times daily.         . diazepam (VALIUM) 5 MG tablet   Oral   Take 5 mg by mouth 3 (three) times daily.           BP 95/77  Pulse 97  Temp(Src) 98.4 F (36.9 C) (Oral)  Resp 20  SpO2 96%  Physical Exam 1135: Physical examination:  Nursing notes reviewed; Vital signs and O2 SAT reviewed;  Constitutional: Well developed, Well nourished, Well hydrated, In no acute distress; Head:  Normocephalic, atraumatic; Eyes: EOMI, PERRL, No scleral icterus; ENMT: Mouth and pharynx normal, Mucous membranes moist; Neck: Supple, Full range of motion, No lymphadenopathy; Cardiovascular: Regular rate and rhythm, No gallop; Respiratory: Breath sounds clear & equal bilaterally, No wheezes.  Speaking full sentences with ease, Normal respiratory effort/excursion; Chest: Nontender, Movement normal; Abdomen: Soft, Nontender, Nondistended, Normal bowel sounds; Genitourinary: No CVA tenderness; Spine:  No midline CS, TS, LS tenderness.  +TTP right cervical paraspinal muscles and right trapezius muscle. No rash.;; Extremities: Pulses normal, NT right shoulder/elbow/wrist/hand. No deformity. No edema, No calf edema or asymmetry.; Neuro: AA&Ox3, Major CN grossly intact.  Speech clear. No facial droop. Climbs on and off stretcher easily by himself. Gait steady. Grips equal. Strength 5/5 equal bilat UE's and LE's. No gross focal motor or sensory deficits in extremities.; Skin: Color normal, Warm, Dry.   ED Course  Procedures    MDM  MDM Reviewed: previous chart, nursing note and vitals Reviewed previous: CT scan and x-ray Interpretation: CT scan  and x-ray   Dg Chest 2 View 02/16/2013  *RADIOLOGY REPORT*  Clinical Data: Cough, shortness of breath  CHEST - 2 VIEW  Comparison: 02/13/2013  Findings: Chronic interstitial markings/emphysematous changes.  No focal consolidation.  No pleural effusion or pneumothorax.  The heart is normal in size.  Mild degenerative changes of the visualized thoracolumbar spine.  IMPRESSION: No evidence of acute cardiopulmonary disease.   Original Report Authenticated By: Charline Bills, M.D.     Ct Cervical Spine Wo Contrast 02/16/2013  *RADIOLOGY REPORT*  Clinical Data: 79 year old male with left arm pain.  Right neck pain.  No injury.  CT CERVICAL SPINE WITHOUT CONTRAST  Technique:  Multidetector CT imaging of the cervical spine was performed. Multiplanar CT image reconstructions were also generated.  Comparison: Lateral radiograph of the neck 01/01/2013. Head CT without contrast 08/08/2012.  Findings: Calcified atherosclerosis at the skull base.  Stable and negative for age visible posterior fossa brain parenchyma. Visualized paranasal sinuses and mastoids are clear.  Advanced calcified carotid atherosclerosis in the neck as well.  Other Visualized paraspinal soft tissues are within normal limits. Negative lung apices.  Osteopenia.  Mild straightening of cervical lordosis. Visualized skull base is intact.  No atlanto-occipital dissociation.  Degenerative ligamentous hypertrophy about the odontoid.  No cervicomedullary junction stenosis.  Multilevel mild spondylolisthesis; retrolisthesis of C3 on C4, C4 on C5, and C5 on C6.  Trace anterolisthesis of C7 on T1. Bilateral posterior element alignment is within normal limits.  No acute cervical spine fracture identified.  C2-C3:  Left greater than right uncovertebral hypertrophy. Circumferential disc bulge with broad-based posterior component. No significant spinal stenosis.  Mild left C3 foraminal stenosis.  C3-C4:  Severe disc space loss.  Bulky circumferential disc  osteophyte complex eccentric to the right.  Facet hypertrophy. Broad-based posterior disc component.  Ligament flavum hypertrophy. Moderate to severe spinal stenosis suspected with AP thecal sac estimated at 4-5 mm.  Severe right greater than left C4 foraminal stenosis.  C4-C5:  Severe disc space loss.  Bulky right eccentric circumferential disc osteophyte complex.  Right paracentral broad- based component of disc.  Ligament flavum hypertrophy.  Facet hypertrophy.  Moderate spinal stenosis suspected, estimated AP thecal sac 6 mm.  Severe right and moderate to severe left C5 foraminal stenosis.  C5-C6:  Severe disc space loss.  Bulky circumferential disc osteophyte complex.  Broad-based component of disc.  Borderline to mild spinal stenosis suspected with AP thecal sac estimated at 8 mm.  Severe right and moderate to severe left C6 foraminal stenosis.  C6-C7:  Mild circumferential disc bulge.  No significant stenosis.  C7-T1:  Negative. T1-T2 level remarkable for mild facet and uncovertebral hypertrophy resulting in mild bilateral T1 foraminal stenosis.  IMPRESSION: 1.  Moderate to severe multifactorial cervical spinal stenosis at C3-C4 and C4-C5.  Mild spinal stenosis at C5-C6. 2.  Mostly severe multifactorial neural foraminal stenosis at the bilateral C4, bilateral C5 and bilateral C6 nerve levels. 3.  Calcified atherosclerosis of the cervical carotid arteries and vessels at the skull base.   Original Report Authenticated By: Erskine Speed, M.D.     1250:  Known cervical DDD on CT scan. No acute neuro deficits on exam.  Pt wants to go home now. Dx and testing d/w pt.  Questions answered.  Verb understanding, agreeable to d/c home with outpt f/u.  Pt requests "a dose of that medicine" before he gets discharged.  Will give dose PO robaxin while in the ED.   1305:  Pt refused dose of robaxin and left the ED.      Laray Anger, DO 02/18/13 1355

## 2013-02-16 NOTE — ED Notes (Signed)
Pt reports right arm pain since last night, denies any known injury Also wants his "breathing checked out"

## 2013-02-16 NOTE — ED Notes (Signed)
Pt c/o left arm pain since last night and SOB which is a chronic problem. Pt denies injury to arm and reports sudden onset last night.

## 2013-02-16 NOTE — ED Provider Notes (Signed)
History     CSN: [id number]  Arrival date & time 02/16/13  1357   None     Chief Complaint  Patient presents with  . Shortness of Breath     HPI Pt was seen at 1400.   Per pt, c/o gradual onset and persistence of constant SOB for the past several minutes.  Pt states his SOB began when he "went outside in the cold" after eating at a local fast food restaurant.  Denies CP/palpitations, no cough, no abd pain, no N/V/D, no fevers. The symptoms have been associated with no other complaints. The patient has a significant history of similar symptoms previously, recently being evaluated for this complaint and multiple prior evals for same.       Past Medical History  Diagnosis Date  . COPD (chronic obstructive pulmonary disease)   . Anxiety   . Alcohol abuse   . Anemia   . Generalized headaches   . Hypertension   . 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 Pulmonary nodule/lesion, solitary   . Schizoaffective disorder   . GI bleed     History of recurrent bleeding that dates back as far as 2004 per records,  . DDD (degenerative disc disease), cervical     Past Surgical History  Procedure Laterality Date  . Colonoscopy  Sept 2009    SLF: few small AVMs in ascending colon distal to IC . Ablated via BICAP.   Marland Kitchen Esophagogastroduodenoscopy  Sept 2009    SLF: normal esophagus, small HH, 1-2 AVMs actively oozing in mid stomach, s/p BICAP, path benign   . Esophagogastroduodenoscopy  01/18/2012    Rourk-Erosive reflux esophagitis. Noncritical ring. Antral erosions/ small antral ulcer - appeared innocent  (H pylori serrology negative)  . Colonoscopy  03/09/2012    Procedure: COLONOSCOPY;  Surgeon: West Bali, MD;  Location: [facility];  Service: Endoscopy;  Laterality: N/A;  2:30  . Esophagogastroduodenoscopy  03/09/2012    Procedure: ESOPHAGOGASTRODUODENOSCOPY (EGD);  Surgeon: West Bali, MD;  Location: [facility];   Service: Endoscopy;  Laterality: N/A;    Family History  Problem Relation Age of Onset  . Colon cancer Neg Hx     History  Substance Use Topics  . Smoking status: Former Smoker    Types: Cigarettes    Quit date: 05/07/1969  . Smokeless tobacco: Current User    Types: Snuff  . Alcohol Use: 0.0 oz/week     Comment: "everyday"      Review of Systems ROS: Statement: All systems negative except as marked or noted in the HPI; Constitutional: Negative for fever and chills. ; ; Eyes: Negative for eye pain, redness and discharge. ; ; ENMT: Negative for ear pain, hoarseness, nasal congestion, sinus pressure and sore throat. ; ; Cardiovascular: Negative for chest pain, palpitations, diaphoresis, and peripheral edema. ; ; Respiratory: +SOB. Negative for cough, wheezing and stridor. ; ; Gastrointestinal: Negative for nausea, vomiting, diarrhea, abdominal pain, blood in stool, hematemesis, jaundice and rectal bleeding. . ; ; Genitourinary: Negative for dysuria, flank pain and hematuria. ; ; Musculoskeletal: Negative for back pain and neck pain. Negative for swelling and trauma.; ; Skin: Negative for pruritus, rash, abrasions, blisters, bruising and skin lesion.; ; Neuro: Negative for headache, lightheadedness and neck stiffness. Negative for weakness, altered level of consciousness , altered mental status, extremity weakness, paresthesias, involuntary movement, seizure and syncope.  Allergies  Black pepper  Home Medications   Current Outpatient Rx  Name  Route  Sig  Dispense  Refill  . acetaminophen (TYLENOL) 500 MG tablet   Oral   Take 500 mg by mouth daily as needed for pain.         Marland Kitchen albuterol (PROVENTIL HFA;VENTOLIN HFA) 108 (90 BASE) MCG/ACT inhaler   Inhalation   Inhale 2 puffs into the lungs every 6 (six) hours as needed for wheezing or shortness of breath.         Marland Kitchen albuterol (PROVENTIL) (2.5 MG/3ML) 0.083% nebulizer solution   Nebulization   Take 2.5 mg by  nebulization every 6 (six) hours as needed for wheezing or shortness of breath.         . diphenhydrAMINE (BENADRYL) 25 mg capsule   Oral   Take 25 mg by mouth every 6 (six) hours as needed for sleep.         Marland Kitchen HYDROcodone-acetaminophen (NORCO/VICODIN) 5-325 MG per tablet   Oral   Take 1 tablet by mouth 4 (four) times daily.         . diazepam (VALIUM) 5 MG tablet   Oral   Take 5 mg by mouth 3 (three) times daily.         . methocarbamol (ROBAXIN) 500 MG tablet   Oral   Take 1 tablet (500 mg total) by mouth 4 (four) times daily as needed (muscle spasm).   12 tablet   0     BP 109/68  Pulse 93  Temp(Src) 98 F (36.7 C) (Oral)  Resp 19  SpO2 94%  Physical Exam 1405:  Physical examination:  Nursing notes reviewed; Vital signs and O2 SAT reviewed;  Constitutional: Well developed, Well nourished, Well hydrated, In no acute distress; Head:  Normocephalic, atraumatic; Eyes: EOMI, PERRL, No scleral icterus; ENMT: Mouth and pharynx normal, Mucous membranes moist; Neck: Supple, Full range of motion, No lymphadenopathy; Cardiovascular: Regular rate and rhythm, No gallop; Respiratory: Breath sounds clear & equal bilaterally, No rales, rhonchi, wheezes.  Speaking full sentences with ease, Normal respiratory effort/excursion; Chest: Nontender, Movement normal;  Extremities: Pulses normal, No tenderness, No edema, No calf edema or asymmetry.; Neuro: AA&Ox3, Major CN grossly intact.  Speech clear. Gait steady. No gross focal motor or sensory deficits in extremities.; Skin: Color normal, Warm, Dry.   ED Course  Procedures    MDM  MDM Reviewed: previous chart, nursing note and vitals     1410: Pt just d/c from the ED 1 hour ago, walked to a local fast food restaurant, ate lunch, then got a ride back to the ED when he felt SOB.  Did not receive any meds at last ED visit.  Pt with 68 ED visits in the past 6 months for c/o chronic SOB.  Pt currently yelling at ED staff at the top of  his voice.  Clear speech, speaking full sentences without distress.  Pt is able to walk and scream loudly at ED staff without resp distress.  Lungs are CTA. Will dose spacer for the MDI that pt is holding in his hand and then d/c.  Pt agreeable with this plan. Refuses ED staff to assist in finding a ride home for him.         Laray Anger, DO 02/18/13 1357

## 2013-02-17 ENCOUNTER — Encounter (HOSPITAL_COMMUNITY): Payer: Self-pay

## 2013-02-17 ENCOUNTER — Emergency Department (HOSPITAL_COMMUNITY)
Admission: EM | Admit: 2013-02-17 | Discharge: 2013-02-17 | Disposition: A | Discharge status: Home / Self Care | Payer: PRIVATE HEALTH INSURANCE | Attending: Emergency Medicine | Admitting: Emergency Medicine

## 2013-02-17 DIAGNOSIS — Z862 Personal history of diseases of the blood and blood-forming organs and certain disorders involving the immune mechanism: Secondary | ICD-10-CM | POA: Insufficient documentation

## 2013-02-17 DIAGNOSIS — Z8739 Personal history of other diseases of the musculoskeletal system and connective tissue: Secondary | ICD-10-CM | POA: Insufficient documentation

## 2013-02-17 DIAGNOSIS — Z79899 Other long term (current) drug therapy: Secondary | ICD-10-CM | POA: Insufficient documentation

## 2013-02-17 DIAGNOSIS — F2089 Other schizophrenia: Secondary | ICD-10-CM | POA: Insufficient documentation

## 2013-02-17 DIAGNOSIS — J449 Chronic obstructive pulmonary disease, unspecified: Secondary | ICD-10-CM

## 2013-02-17 DIAGNOSIS — J45902 Unspecified asthma with status asthmaticus: Secondary | ICD-10-CM | POA: Insufficient documentation

## 2013-02-17 DIAGNOSIS — F411 Generalized anxiety disorder: Secondary | ICD-10-CM | POA: Insufficient documentation

## 2013-02-17 DIAGNOSIS — Z8709 Personal history of other diseases of the respiratory system: Secondary | ICD-10-CM | POA: Insufficient documentation

## 2013-02-17 DIAGNOSIS — Z87891 Personal history of nicotine dependence: Secondary | ICD-10-CM | POA: Insufficient documentation

## 2013-02-17 DIAGNOSIS — I10 Essential (primary) hypertension: Secondary | ICD-10-CM | POA: Insufficient documentation

## 2013-02-17 DIAGNOSIS — Z8719 Personal history of other diseases of the digestive system: Secondary | ICD-10-CM | POA: Insufficient documentation

## 2013-02-17 MED ORDER — ALBUTEROL SULFATE (5 MG/ML) 0.5% IN NEBU
5.0000 mg | INHALATION_SOLUTION | Freq: Once | RESPIRATORY_TRACT | Status: AC
  Administered 2013-02-17: 5 mg via RESPIRATORY_TRACT
  Filled 2013-02-17: qty 1

## 2013-02-17 MED ORDER — IPRATROPIUM BROMIDE 0.02 % IN SOLN
0.5000 mg | Freq: Once | RESPIRATORY_TRACT | Status: AC
  Administered 2013-02-17: 0.5 mg via RESPIRATORY_TRACT
  Filled 2013-02-17: qty 2.5

## 2013-02-17 NOTE — ED Notes (Signed)
Patient complaining of shortness of breath. Sats were 96% on room air, given 1 albuterol treatment enroute to ER.

## 2013-02-17 NOTE — ED Notes (Signed)
resp therapy in room at this time 

## 2013-02-17 NOTE — ED Provider Notes (Signed)
History     CSN: [id number]  Arrival date & time 02/17/13  4098   First MD Initiated Contact with Patient 02/17/13 [phone]      Chief Complaint  Patient presents with  . Shortness of Breath    (Consider location/radiation/quality/duration/timing/severity/associated sxs/prior treatment) HPI [name] is a 79 y.o. male with a h/o COPD, recent multiple visits to the ER for SOB, anxiety, HTN brought in by ambulance, who presents to the Emergency Department complaining of shortness of breath that developed when he got up to go to the bathroom. He did not use his nebulizer. He called 911. He is talking in full sentences.He was seen here yesterday and had a chest xray and CT of his cervical spine. Both reports are attached below.  Neither showed anything acute.  PCP Dr. Juanetta Gosling Past Medical History  Diagnosis Date  . COPD (chronic obstructive pulmonary disease)   . Anxiety   . Alcohol abuse   . Anemia   . Generalized headaches   . Hypertension   . 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 Pulmonary nodule/lesion, solitary   . Schizoaffective disorder   . GI bleed     History of recurrent bleeding that dates back as far as 2004 per records,  . DDD (degenerative disc disease), cervical     Past Surgical History  Procedure Laterality Date  . Colonoscopy  Sept 2009    SLF: few small AVMs in ascending colon distal to IC . Ablated via BICAP.   Marland Kitchen Esophagogastroduodenoscopy  Sept 2009    SLF: normal esophagus, small HH, 1-2 AVMs actively oozing in mid stomach, s/p BICAP, path benign   . Esophagogastroduodenoscopy  01/18/2012    Rourk-Erosive reflux esophagitis. Noncritical ring. Antral erosions/ small antral ulcer - appeared innocent  (H pylori serrology negative)  . Colonoscopy  03/09/2012    Procedure: COLONOSCOPY;  Surgeon: West Bali, MD;  Location: [facility];  Service: Endoscopy;  Laterality: N/A;  2:30  .  Esophagogastroduodenoscopy  03/09/2012    Procedure: ESOPHAGOGASTRODUODENOSCOPY (EGD);  Surgeon: West Bali, MD;  Location: [facility];  Service: Endoscopy;  Laterality: N/A;    Family History  Problem Relation Age of Onset  . Colon cancer Neg Hx     History  Substance Use Topics  . Smoking status: Former Smoker    Types: Cigarettes    Quit date: 05/07/1969  . Smokeless tobacco: Current User    Types: Snuff  . Alcohol Use: 0.0 oz/week     Comment: "everyday"      Review of Systems  Constitutional: Negative for fever.       10 Systems reviewed and are negative for acute change except as noted in the HPI.  HENT: Negative for congestion.   Eyes: Negative for discharge and redness.  Respiratory: Positive for shortness of breath. Negative for cough.   Cardiovascular: Negative for chest pain.  Gastrointestinal: Negative for vomiting and abdominal pain.  Musculoskeletal: Negative for back pain.  Skin: Negative for rash.  Neurological: Negative for syncope, numbness and headaches.  Psychiatric/Behavioral:       No behavior change.    Allergies  Black pepper  Home Medications   Current Outpatient Rx  Name  Route  Sig  Dispense  Refill  . acetaminophen (TYLENOL) 500 MG tablet   Oral   Take 500 mg by mouth daily as needed for pain.         Marland Kitchen  albuterol (PROVENTIL HFA;VENTOLIN HFA) 108 (90 BASE) MCG/ACT inhaler   Inhalation   Inhale 2 puffs into the lungs every 6 (six) hours as needed for wheezing or shortness of breath.         Marland Kitchen albuterol (PROVENTIL) (2.5 MG/3ML) 0.083% nebulizer solution   Nebulization   Take 2.5 mg by nebulization every 6 (six) hours as needed for wheezing or shortness of breath.         . diazepam (VALIUM) 5 MG tablet   Oral   Take 5 mg by mouth 3 (three) times daily.         . diphenhydrAMINE (BENADRYL) 25 mg capsule   Oral   Take 25 mg by mouth every 6 (six) hours as needed for sleep.         Marland Kitchen HYDROcodone-acetaminophen  (NORCO/VICODIN) 5-325 MG per tablet   Oral   Take 1 tablet by mouth 4 (four) times daily.         . methocarbamol (ROBAXIN) 500 MG tablet   Oral   Take 1 tablet (500 mg total) by mouth 4 (four) times daily as needed (muscle spasm).   12 tablet   0     BP 140/86  Pulse 85  Temp(Src) 98 F (36.7 C) (Oral)  Resp 20  Ht 5\' 5"  (1.651 m)  Wt 135 lb (61.236 kg)  BMI 22.47 kg/m2  SpO2 99%  Physical Exam  Nursing note and vitals reviewed. Constitutional: He appears well-developed and well-nourished.  Awake, alert, nontoxic appearance.  HENT:  Head: Normocephalic and atraumatic.  Right Ear: External ear normal.  Left Ear: External ear normal.  Eyes: EOM are normal. Pupils are equal, round, and reactive to light.  Neck: Neck supple.  Cardiovascular: Normal rate.   Pulmonary/Chest: Effort normal and breath sounds normal. He has no wheezes. He has no rales. He exhibits no tenderness.  Abdominal: Soft. There is no tenderness. There is no rebound.  Musculoskeletal: He exhibits no tenderness.  Baseline ROM, no obvious new focal weakness.  Neurological:  Mental status and motor strength appears baseline for patient and situation.  Skin: No rash noted.  Psychiatric: He has a normal mood and affect.    ED Course  Procedures (including critical care time)    Reviewed xrays and CT Dg Chest 2 View  02/16/2013  *RADIOLOGY REPORT*  Clinical Data: Cough, shortness of breath  CHEST - 2 VIEW  Comparison: 02/13/2013  Findings: Chronic interstitial markings/emphysematous changes.  No focal consolidation.  No pleural effusion or pneumothorax.  The heart is normal in size.  Mild degenerative changes of the visualized thoracolumbar spine.  IMPRESSION: No evidence of acute cardiopulmonary disease.   Original Report Authenticated By: Charline Bills, M.D.    Ct Cervical Spine Wo Contrast  02/16/2013  *RADIOLOGY REPORT*  Clinical Data: 79 year old male with left arm pain.  Right neck pain.  No  injury.  CT CERVICAL SPINE WITHOUT CONTRAST  Technique:  Multidetector CT imaging of the cervical spine was performed. Multiplanar CT image reconstructions were also generated.  Comparison: Lateral radiograph of the neck 01/01/2013. Head CT without contrast 08/08/2012.  Findings: Calcified atherosclerosis at the skull base.  Stable and negative for age visible posterior fossa brain parenchyma. Visualized paranasal sinuses and mastoids are clear.  Advanced calcified carotid atherosclerosis in the neck as well.  Other Visualized paraspinal soft tissues are within normal limits. Negative lung apices.  Osteopenia.  Mild straightening of cervical lordosis. Visualized skull base is intact.  No atlanto-occipital dissociation.  Degenerative ligamentous hypertrophy about the odontoid.  No cervicomedullary junction stenosis.  Multilevel mild spondylolisthesis; retrolisthesis of C3 on C4, C4 on C5, and C5 on C6.  Trace anterolisthesis of C7 on T1. Bilateral posterior element alignment is within normal limits.  No acute cervical spine fracture identified.  C2-C3:  Left greater than right uncovertebral hypertrophy. Circumferential disc bulge with broad-based posterior component. No significant spinal stenosis.  Mild left C3 foraminal stenosis.  C3-C4:  Severe disc space loss.  Bulky circumferential disc osteophyte complex eccentric to the right.  Facet hypertrophy. Broad-based posterior disc component.  Ligament flavum hypertrophy. Moderate to severe spinal stenosis suspected with AP thecal sac estimated at 4-5 mm.  Severe right greater than left C4 foraminal stenosis.  C4-C5:  Severe disc space loss.  Bulky right eccentric circumferential disc osteophyte complex.  Right paracentral broad- based component of disc.  Ligament flavum hypertrophy.  Facet hypertrophy.  Moderate spinal stenosis suspected, estimated AP thecal sac 6 mm.  Severe right and moderate to severe left C5 foraminal stenosis.  C5-C6:  Severe disc space loss.   Bulky circumferential disc osteophyte complex.  Broad-based component of disc.  Borderline to mild spinal stenosis suspected with AP thecal sac estimated at 8 mm.  Severe right and moderate to severe left C6 foraminal stenosis.  C6-C7:  Mild circumferential disc bulge.  No significant stenosis.  C7-T1:  Negative. T1-T2 level remarkable for mild facet and uncovertebral hypertrophy resulting in mild bilateral T1 foraminal stenosis.  IMPRESSION: 1.  Moderate to severe multifactorial cervical spinal stenosis at C3-C4 and C4-C5.  Mild spinal stenosis at C5-C6. 2.  Mostly severe multifactorial neural foraminal stenosis at the bilateral C4, bilateral C5 and bilateral C6 nerve levels. 3.  Calcified atherosclerosis of the cervical carotid arteries and vessels at the skull base.   Original Report Authenticated By: Erskine Speed, M.D.    Medications  albuterol (PROVENTIL) (5 MG/ML) 0.5% nebulizer solution 5 mg (5 mg Nebulization Given 02/17/13 0348)  ipratropium (ATROVENT) nebulizer solution 0.5 mg (0.5 mg Nebulization Given 02/17/13 0348)      0401 Patient has received albuterol.atroent nebulizer treatment. Shortness of breath has cleared.He continues to talk in full sentences. O2 sat on RA is 95-97%. MDM  Patient with COPD who is here with shortness of breath. Given albuterol/atrovent neb with resolution. Pt stable in ED with no significant deterioration in condition.The patient appears reasonably screened and/or stabilized for discharge and I doubt any other medical condition or other [facility] requiring further screening, evaluation, or treatment in the ED at this time prior to discharge.  MDM Reviewed: nursing note and vitals           Nicoletta Dress. Colon Branch, MD 02/17/13 [id number]

## 2013-02-17 NOTE — ED Notes (Signed)
Pt states he woke up and was still SOB, was seen here previously today for same. Per EMS 96% on room air. Lung sounds clear. Pt speaking normally, NAD. Given albuterol nebulizer by EMS.

## 2013-02-18 ENCOUNTER — Encounter (HOSPITAL_COMMUNITY): Payer: Self-pay | Admitting: *Deleted

## 2013-02-18 ENCOUNTER — Encounter (HOSPITAL_COMMUNITY): Payer: Self-pay | Admitting: Emergency Medicine

## 2013-02-18 ENCOUNTER — Emergency Department (HOSPITAL_COMMUNITY): Payer: PRIVATE HEALTH INSURANCE

## 2013-02-18 ENCOUNTER — Emergency Department (HOSPITAL_COMMUNITY)
Admission: EM | Admit: 2013-02-18 | Discharge: 2013-02-19 | Disposition: A | Discharge status: Home / Self Care | Payer: PRIVATE HEALTH INSURANCE | Attending: Emergency Medicine | Admitting: Emergency Medicine

## 2013-02-18 ENCOUNTER — Emergency Department (HOSPITAL_COMMUNITY)
Admission: EM | Admit: 2013-02-18 | Discharge: 2013-02-18 | Disposition: A | Discharge status: Home / Self Care | Payer: PRIVATE HEALTH INSURANCE | Source: Home / Self Care | Attending: Emergency Medicine | Admitting: Emergency Medicine

## 2013-02-18 DIAGNOSIS — Z8659 Personal history of other mental and behavioral disorders: Secondary | ICD-10-CM | POA: Insufficient documentation

## 2013-02-18 DIAGNOSIS — F0390 Unspecified dementia without behavioral disturbance: Secondary | ICD-10-CM

## 2013-02-18 DIAGNOSIS — J449 Chronic obstructive pulmonary disease, unspecified: Secondary | ICD-10-CM

## 2013-02-18 DIAGNOSIS — R059 Cough, unspecified: Secondary | ICD-10-CM | POA: Insufficient documentation

## 2013-02-18 DIAGNOSIS — Z87891 Personal history of nicotine dependence: Secondary | ICD-10-CM | POA: Insufficient documentation

## 2013-02-18 DIAGNOSIS — M25519 Pain in unspecified shoulder: Secondary | ICD-10-CM | POA: Insufficient documentation

## 2013-02-18 DIAGNOSIS — Z8739 Personal history of other diseases of the musculoskeletal system and connective tissue: Secondary | ICD-10-CM | POA: Insufficient documentation

## 2013-02-18 DIAGNOSIS — Z87798 Personal history of other (corrected) congenital malformations: Secondary | ICD-10-CM | POA: Insufficient documentation

## 2013-02-18 DIAGNOSIS — Z862 Personal history of diseases of the blood and blood-forming organs and certain disorders involving the immune mechanism: Secondary | ICD-10-CM | POA: Insufficient documentation

## 2013-02-18 DIAGNOSIS — Z79899 Other long term (current) drug therapy: Secondary | ICD-10-CM | POA: Insufficient documentation

## 2013-02-18 DIAGNOSIS — Z8669 Personal history of other diseases of the nervous system and sense organs: Secondary | ICD-10-CM | POA: Insufficient documentation

## 2013-02-18 DIAGNOSIS — J3489 Other specified disorders of nose and nasal sinuses: Secondary | ICD-10-CM | POA: Insufficient documentation

## 2013-02-18 DIAGNOSIS — J441 Chronic obstructive pulmonary disease with (acute) exacerbation: Secondary | ICD-10-CM | POA: Insufficient documentation

## 2013-02-18 DIAGNOSIS — M25511 Pain in right shoulder: Secondary | ICD-10-CM

## 2013-02-18 DIAGNOSIS — F411 Generalized anxiety disorder: Secondary | ICD-10-CM | POA: Insufficient documentation

## 2013-02-18 DIAGNOSIS — Z87828 Personal history of other (healed) physical injury and trauma: Secondary | ICD-10-CM | POA: Insufficient documentation

## 2013-02-18 DIAGNOSIS — Z8709 Personal history of other diseases of the respiratory system: Secondary | ICD-10-CM | POA: Insufficient documentation

## 2013-02-18 DIAGNOSIS — F259 Schizoaffective disorder, unspecified: Secondary | ICD-10-CM | POA: Insufficient documentation

## 2013-02-18 DIAGNOSIS — R05 Cough: Secondary | ICD-10-CM | POA: Insufficient documentation

## 2013-02-18 DIAGNOSIS — IMO0002 Reserved for concepts with insufficient information to code with codable children: Secondary | ICD-10-CM | POA: Insufficient documentation

## 2013-02-18 DIAGNOSIS — G8929 Other chronic pain: Secondary | ICD-10-CM | POA: Insufficient documentation

## 2013-02-18 DIAGNOSIS — I10 Essential (primary) hypertension: Secondary | ICD-10-CM | POA: Insufficient documentation

## 2013-02-18 DIAGNOSIS — J309 Allergic rhinitis, unspecified: Secondary | ICD-10-CM | POA: Insufficient documentation

## 2013-02-18 DIAGNOSIS — Z8719 Personal history of other diseases of the digestive system: Secondary | ICD-10-CM | POA: Insufficient documentation

## 2013-02-18 LAB — CBC WITH DIFFERENTIAL/PLATELET
Basophils Absolute: 0 10*3/uL (ref 0.0–0.1)
Basophils Relative: 0 % (ref 0–1)
HCT: 31.8 % — ABNORMAL LOW (ref 39.0–52.0)
Hemoglobin: 10.5 g/dL — ABNORMAL LOW (ref 13.0–17.0)
Lymphocytes Relative: 3 % — ABNORMAL LOW (ref 12–46)
MCHC: 33 g/dL (ref 30.0–36.0)
Monocytes Absolute: 0 10*3/uL — ABNORMAL LOW (ref 0.1–1.0)
Monocytes Relative: 1 % — ABNORMAL LOW (ref 3–12)
Neutro Abs: 3.9 10*3/uL (ref 1.7–7.7)
Neutrophils Relative %: 96 % — ABNORMAL HIGH (ref 43–77)
RDW: 16.1 % — ABNORMAL HIGH (ref 11.5–15.5)
WBC: 4 10*3/uL (ref 4.0–10.5)

## 2013-02-18 MED ORDER — IPRATROPIUM BROMIDE 0.02 % IN SOLN
0.5000 mg | Freq: Once | RESPIRATORY_TRACT | Status: AC
  Administered 2013-02-18: 0.5 mg via RESPIRATORY_TRACT
  Filled 2013-02-18: qty 2.5

## 2013-02-18 MED ORDER — METHYLPREDNISOLONE SODIUM SUCC 125 MG IJ SOLR
125.0000 mg | Freq: Once | INTRAMUSCULAR | Status: DC
  Filled 2013-02-18: qty 2

## 2013-02-18 MED ORDER — LORATADINE 10 MG PO TABS: 10.0000 mg | ORAL_TABLET | Freq: Every day | ORAL | Status: AC

## 2013-02-18 MED ORDER — ALBUTEROL (5 MG/ML) CONTINUOUS INHALATION SOLN
10.0000 mg/h | INHALATION_SOLUTION | RESPIRATORY_TRACT | Status: DC
  Administered 2013-02-18: 10 mg/h via RESPIRATORY_TRACT
  Filled 2013-02-18: qty 20

## 2013-02-18 MED ORDER — METHYLPREDNISOLONE SODIUM SUCC 125 MG IJ SOLR
125.0000 mg | Freq: Once | INTRAMUSCULAR | Status: AC
  Administered 2013-02-18: 125 mg via INTRAMUSCULAR

## 2013-02-18 MED ORDER — ALBUTEROL SULFATE (5 MG/ML) 0.5% IN NEBU
5.0000 mg | INHALATION_SOLUTION | Freq: Once | RESPIRATORY_TRACT | Status: AC
  Administered 2013-02-18: 5 mg via RESPIRATORY_TRACT
  Filled 2013-02-18: qty 1

## 2013-02-18 MED ORDER — PREDNISONE 20 MG PO TABS: 40.0000 mg | ORAL_TABLET | Freq: Every day | ORAL | Status: AC

## 2013-02-18 NOTE — ED Provider Notes (Signed)
History  This chart was scribed for Loren Racer, MD by Shari Heritage, ED Scribe. The patient was seen in room [facility]. Patient's care was started at 1429.   CSN: [id number]  Arrival date & time 02/18/13  1114   First MD Initiated Contact with Patient 02/18/13 1429      Chief Complaint  Patient presents with  . Shortness of Breath    Patient is a 79 y.o. male presenting with shortness of breath. The history is provided by the patient. No language interpreter was used.  Shortness of Breath Severity:  Moderate Timing:  Constant Progression:  Unchanged Chronicity:  Chronic Worsened by:  Exertion Associated symptoms: no chest pain, no cough, no fever and no vomiting     HPI Comments: Deontaye Civello is a 79 y.o. male with history of COPD who presents to the Emergency Department complaining of persistent shortness of breath and dyspnea on exertion onset earlier today. He states that shortness of breath is exacerbated by pollen and seasonal allergies. Associated symptoms include cough and rhinorrhea. He states that he uses a nebulizer at home, but he hasn't had a treatment today. Patient has had frequent visits to the ED for the same, the last on  02/17/2013.   Past Medical History  Diagnosis Date  . COPD (chronic obstructive pulmonary disease)   . Anxiety   . Alcohol abuse   . Anemia   . Generalized headaches   . Hypertension   . 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 Pulmonary nodule/lesion, solitary   . Schizoaffective disorder   . GI bleed     History of recurrent bleeding that dates back as far as 2004 per records,  . DDD (degenerative disc disease), cervical     Past Surgical History  Procedure Laterality Date  . Colonoscopy  Sept 2009    SLF: few small AVMs in ascending colon distal to IC . Ablated via BICAP.   Marland Kitchen Esophagogastroduodenoscopy  Sept 2009    SLF: normal esophagus, small HH, 1-2 AVMs actively  oozing in mid stomach, s/p BICAP, path benign   . Esophagogastroduodenoscopy  01/18/2012    Rourk-Erosive reflux esophagitis. Noncritical ring. Antral erosions/ small antral ulcer - appeared innocent  (H pylori serrology negative)  . Colonoscopy  03/09/2012    Procedure: COLONOSCOPY;  Surgeon: West Bali, MD;  Location: [facility];  Service: Endoscopy;  Laterality: N/A;  2:30  . Esophagogastroduodenoscopy  03/09/2012    Procedure: ESOPHAGOGASTRODUODENOSCOPY (EGD);  Surgeon: West Bali, MD;  Location: [facility];  Service: Endoscopy;  Laterality: N/A;    Family History  Problem Relation Age of Onset  . Colon cancer Neg Hx     History  Substance Use Topics  . Smoking status: Former Smoker    Types: Cigarettes    Quit date: 05/07/1969  . Smokeless tobacco: Current User    Types: Snuff  . Alcohol Use: 0.0 oz/week     Comment: "everyday"      Review of Systems  Constitutional: Negative for fever.  HENT: Positive for congestion and rhinorrhea.   Respiratory: Positive for shortness of breath. Negative for cough.   Cardiovascular: Negative for chest pain.  Gastrointestinal: Negative for vomiting.  All other systems reviewed and are negative.    Allergies  Black pepper  Home Medications   Current Outpatient Rx  Name  Route  Sig  Dispense  Refill  . acetaminophen (  TYLENOL) 500 MG tablet   Oral   Take 500 mg by mouth daily as needed for pain.         Marland Kitchen albuterol (PROVENTIL HFA;VENTOLIN HFA) 108 (90 BASE) MCG/ACT inhaler   Inhalation   Inhale 2 puffs into the lungs every 6 (six) hours as needed for wheezing or shortness of breath.         Marland Kitchen albuterol (PROVENTIL) (2.5 MG/3ML) 0.083% nebulizer solution   Nebulization   Take 2.5 mg by nebulization every 6 (six) hours as needed for wheezing or shortness of breath.         . diazepam (VALIUM) 5 MG tablet   Oral   Take 5 mg by mouth 3 (three) times daily.         Marland Kitchen HYDROcodone-acetaminophen (NORCO/VICODIN)  5-325 MG per tablet   Oral   Take 1 tablet by mouth 4 (four) times daily.         . diphenhydrAMINE (BENADRYL) 25 mg capsule   Oral   Take 25 mg by mouth every 6 (six) hours as needed for sleep.         Marland Kitchen loratadine (CLARITIN) 10 MG tablet   Oral   Take 1 tablet (10 mg total) by mouth daily. One po daily x 5 days   5 tablet   0   . methocarbamol (ROBAXIN) 500 MG tablet   Oral   Take 1 tablet (500 mg total) by mouth 4 (four) times daily as needed (muscle spasm).   12 tablet   0   . predniSONE (DELTASONE) 20 MG tablet   Oral   Take 2 tablets (40 mg total) by mouth daily.   10 tablet   0     Triage Vitals: BP 132/83  Pulse 88  Temp(Src) 97.8 F (36.6 C) (Oral)  Resp 21  Ht 5' (1.524 m)  Wt 135 lb (61.236 kg)  BMI 26.37 kg/m2  SpO2 98%  Physical Exam  Constitutional: He is oriented to person, place, and time. He appears well-developed and well-nourished.  HENT:  Head: Normocephalic and atraumatic.  Eyes: Conjunctivae and EOM are normal. Pupils are equal, round, and reactive to light.  Neck: Normal range of motion. Neck supple.  Cardiovascular: Normal rate and regular rhythm.   Pulmonary/Chest: Effort normal. No respiratory distress. He has wheezes (faint, expiratory).  Abdominal: Soft. There is no tenderness.  Musculoskeletal: Normal range of motion. He exhibits no edema.  Neurological: He is alert and oriented to person, place, and time.  Skin: Skin is warm and dry.    ED Course  Procedures (including critical care time) DIAGNOSTIC STUDIES: Oxygen Saturation is 98% on room air, normal by my interpretation.    COORDINATION OF CARE: 2:51 PM- Patient informed of current plan for treatment and evaluation and agrees with plan at this time.    Labs Reviewed - No data to display  Dg Chest 2 View  02/18/2013  *RADIOLOGY REPORT*  Clinical Data: Shortness of breath  CHEST - 2 VIEW  Comparison: 02/16/2013  Findings: The heart and pulmonary vascularity are within  normal limits.  Mild chronic interstitial changes are again seen.  No focal confluent infiltrate is noted.  Mild hyperinflation is noted. No bony abnormality is seen.  IMPRESSION: No acute abnormality noted.   Original Report Authenticated By: Alcide Clever, M.D.      1. COPD exacerbation       MDM  I personally performed the services described in this documentation, which was scribed in  my presence. The recorded information has been reviewed and is accurate.  Improvement with nebs. Pt in no distress. Will add claritin.   Loren Racer, MD 02/18/13 [phone]

## 2013-02-18 NOTE — ED Notes (Addendum)
Patient complaining of shortness of breath starting tonight while ambulating. States he was treated here earlier today for same. Patient has empty inhaler.

## 2013-02-18 NOTE — ED Notes (Signed)
Dr Rulon Abide in room assessing pt at this time

## 2013-02-18 NOTE — ED Notes (Signed)
Became sob after walking here today.  Speaking clear full sentences in triage.

## 2013-02-18 NOTE — ED Provider Notes (Addendum)
History  This chart was scribed for [name], MD by Greggory Stallion, ED Scribe. This patient was seen in room APA11/APA11 and the patient's care was started at 10:58 PM.   CSN: [id number]  Arrival date & time 02/18/13  1952      Chief Complaint  Patient presents with  . Shortness of Breath    The history is provided by the patient. No language interpreter was used.    [name] is a 79 y.o. male with h/o COPD and HTN who presents to the Emergency Department complaining of shortness of breath that started 2 days ago with associated right shoulder pain. Pt states his shoulder started hurting 15 minutes ago and is worsened when he moves his arm and has hurt before in the past. He states he used an inhaler without the attachable piece, once earlier today and twice since he has been at the hospital. Pt was given prednisone upon discharge from ED today for similar symptoms.  Pt states he took 2 pills of prednisone.  He states when he chews, a white spot in his mouth irritates him. Pt denies previous shoulder problems, hemoptysis, previous blood clots, fever, neck pain, sore throat, visual disturbance, CP, cough, abdominal pain, nausea, emesis, diarrhea, urinary symptoms, back pain, HA, dizziness, weakness, runny nose, stuffy nose, numbness and rash as associated symptoms. He continually states he is cold.  Pt states quit smoking 20 years ago. He states he has a history of left knee dislocation. Pt denies any recent surgical history. Pt denies smoking cigarettes or drinking alcohol.   Pt seen earlier today 02/18/13 1114 for similar complaint, no worsening.   Past Medical History  Diagnosis Date  . COPD (chronic obstructive pulmonary disease)   . Anxiety   . Alcohol abuse   . Anemia   . Generalized headaches   . Hypertension   . 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 Pulmonary nodule/lesion, solitary   . Schizoaffective  disorder   . GI bleed     History of recurrent bleeding that dates back as far as 2004 per records,  . DDD (degenerative disc disease), cervical     Past Surgical History  Procedure Laterality Date  . Colonoscopy  Sept 2009    SLF: few small AVMs in ascending colon distal to IC . Ablated via BICAP.   Marland Kitchen Esophagogastroduodenoscopy  Sept 2009    SLF: normal esophagus, small HH, 1-2 AVMs actively oozing in mid stomach, s/p BICAP, path benign   . Esophagogastroduodenoscopy  01/18/2012    Rourk-Erosive reflux esophagitis. Noncritical ring. Antral erosions/ small antral ulcer - appeared innocent  (H pylori serrology negative)  . Colonoscopy  03/09/2012    Procedure: COLONOSCOPY;  Surgeon: West Bali, MD;  Location: [facility];  Service: Endoscopy;  Laterality: N/A;  2:30  . Esophagogastroduodenoscopy  03/09/2012    Procedure: ESOPHAGOGASTRODUODENOSCOPY (EGD);  Surgeon: West Bali, MD;  Location: [facility];  Service: Endoscopy;  Laterality: N/A;    Family History  Problem Relation Age of Onset  . Colon cancer Neg Hx     History  Substance Use Topics  . Smoking status: Former Smoker    Types: Cigarettes    Quit date: 05/07/1969  . Smokeless tobacco: Current User    Types: Snuff  . Alcohol Use: 0.0 oz/week     Comment: "everyday"      Review of Systems At  least 10pt or greater review of systems completed and are negative except where specified in the HPI.  Allergies  Black pepper  Home Medications   Current Outpatient Rx  Name  Route  Sig  Dispense  Refill  . acetaminophen (TYLENOL) 500 MG tablet   Oral   Take 500 mg by mouth daily as needed for pain.         Marland Kitchen albuterol (PROVENTIL HFA;VENTOLIN HFA) 108 (90 BASE) MCG/ACT inhaler   Inhalation   Inhale 2 puffs into the lungs every 6 (six) hours as needed for wheezing or shortness of breath.         Marland Kitchen albuterol (PROVENTIL) (2.5 MG/3ML) 0.083% nebulizer solution   Nebulization   Take 2.5 mg by nebulization  every 6 (six) hours as needed for wheezing or shortness of breath.         . diazepam (VALIUM) 5 MG tablet   Oral   Take 5 mg by mouth 3 (three) times daily.         . diphenhydrAMINE (BENADRYL) 25 mg capsule   Oral   Take 25 mg by mouth every 6 (six) hours as needed for sleep.         Marland Kitchen HYDROcodone-acetaminophen (NORCO/VICODIN) 5-325 MG per tablet   Oral   Take 1 tablet by mouth 4 (four) times daily.         Marland Kitchen loratadine (CLARITIN) 10 MG tablet   Oral   Take 1 tablet (10 mg total) by mouth daily. One po daily x 5 days   5 tablet   0   . methocarbamol (ROBAXIN) 500 MG tablet   Oral   Take 1 tablet (500 mg total) by mouth 4 (four) times daily as needed (muscle spasm).   12 tablet   0   . predniSONE (DELTASONE) 20 MG tablet   Oral   Take 2 tablets (40 mg total) by mouth daily.   10 tablet   0     Triage Vitals: BP 94/60  Pulse 112  Temp(Src) 97.9 F (36.6 C)  Resp 20  Wt 135 lb (61.236 kg)  BMI 26.37 kg/m2  SpO2 93%  Physical Exam  Nursing note and vitals reviewed.   Nursing notes reviewed.  Electronic medical record reviewed. VITAL SIGNS:   Filed Vitals:   02/18/13 2327 02/18/13 2333 02/19/13 0000 02/19/13 0138  BP:   123/78 133/82  Pulse:   86 93  Temp:    98.8 F (37.1 C)  TempSrc:    Oral  Resp:   18 18  Weight:      SpO2: 94% 94% 91% 94%   CONSTITUTIONAL: Awake, oriented to self, and place, appears non-toxic HENT: Atraumatic, normocephalic, oral mucosa pink and moist, airway patent. Nares patent without drainage. External ears normal. EYES: Conjunctiva clear, EOMI, PERRLA NECK: Trachea midline, non-tender, supple CARDIOVASCULAR: Normal heart rate, Normal rhythm, No murmurs, rubs, gallops PULMONARY/CHEST: Poor air movement bilaterally and bilateral wheezing ABDOMINAL: Non-distended, soft, non-tender - no rebound or guarding.  BS normal. NEUROLOGIC: Non-focal, moving all four extremities, no gross sensory or motor deficits. EXTREMITIES: No  clubbing, cyanosis, or edema SKIN: Warm, Dry, No erythema, No rash   ED Course  Procedures (including critical care time)  DIAGNOSTIC STUDIES: Oxygen Saturation is 93% on RA, adequate by my interpretation.    COORDINATION OF CARE: 10:59 PM-Discussed treatment plan which includes  Medications  ipratropium (ATROVENT) nebulizer solution 0.5 mg (0.5 mg Nebulization Given 02/18/13 2325)    And  albuterol (  PROVENTIL) (5 MG/ML) 0.5% nebulizer solution 5 mg (5 mg Nebulization Given 02/18/13 2325)    chest DG, CBC, troponin, EKG, pulse ox, and cardiac monitoring with pt at bedside and pt agreed to plan.    Date: 02/19/2013  Rate: 81  Rhythm: normal sinus rhythm  QRS Axis: normal  Intervals: normal  ST/T Wave abnormalities: normal  Conduction Disutrbances: none  Narrative Interpretation: unremarkable - EKG appears more regular although there is mild underlying sinus arrhythmia, no ST or T wave abnormalities consistent with acute ischemia or infarction.      Labs Reviewed  CBC WITH DIFFERENTIAL - Abnormal; Notable for the following:    RBC 3.75 (*)    Hemoglobin 10.5 (*)    HCT 31.8 (*)    RDW 16.1 (*)    Platelets 431 (*)    Neutrophils Relative 96 (*)    Lymphocytes Relative 3 (*)    Lymphs Abs 0.1 (*)    Monocytes Relative 1 (*)    Monocytes Absolute 0.0 (*)    All other components within normal limits  BASIC METABOLIC PANEL  TROPONIN I   Dg Chest 2 View  02/19/2013  *RADIOLOGY REPORT*  Clinical Data: Shortness of breath and tachycardia.  COPD and hypertension.  CHEST - 2 VIEW  Comparison: 02/18/2013 at 1526 hours.  Findings: Hyperinflation/COPD. Lateral view degraded by patient arm position.  Midline trachea.  Normal heart size and mediastinal contours for age.  No pleural effusion or pneumothorax.  Suspect calcified granulomas bilaterally.  IMPRESSION: COPD/chronic bronchitis, without acute superimposed process.   Original Report Authenticated By: Jeronimo Greaves, M.D.    Dg  Chest 2 View  02/18/2013  *RADIOLOGY REPORT*  Clinical Data: Shortness of breath  CHEST - 2 VIEW  Comparison: 02/16/2013  Findings: The heart and pulmonary vascularity are within normal limits.  Mild chronic interstitial changes are again seen.  No focal confluent infiltrate is noted.  Mild hyperinflation is noted. No bony abnormality is seen.  IMPRESSION: No acute abnormality noted.   Original Report Authenticated By: Alcide Clever, M.D.      1. COPD (chronic obstructive pulmonary disease)   2. Dementia   3. Chronic shoulder pain, right       MDM  [name] is a 79 y.o. male presenting to the emergency department for the second time in 2 days with shortness of breath. Patient does have some dementia at baseline, he says his chief complaint today was initially shortness of breath but now he feels much better.  Patient says he is out of his inhaler and needs another one. Now he says he has some mid thigh pain and some right shoulder pain which has been chronic and intermittent.  Given the fact that the patient had been seen in the ER previously ordered labs and EKG with chest x-ray. Labs and chest x-ray are unremarkable, patient has a negative troponin, I do not think his complaints of shortness of breath or an anginal equivalent. Patient has clear evidence of bilateral shoulder arthritis, given him small dose of ibuprofen to help with that.  The medical emergencies identified at this time, patient has complaints of mild musculoskeletal pain, patient's vital signs are stable and within normal limits. Patient will be discharged home stable and good condition for a mild COPD exacerbation-further discussed with him taking his medications as prescribed,  And continuing his prednisone as well as Claritin given him earlier today in the ER.  I personally performed the services described in this documentation, which  was scribed in my presence. The recorded information has been reviewed and is accurate.  [name], M.D.      [name], MD 02/19/13 1610  [name], MD 03/04/13 2302

## 2013-02-19 ENCOUNTER — Emergency Department (HOSPITAL_COMMUNITY)
Admission: EM | Admit: 2013-02-19 | Discharge: 2013-02-19 | Disposition: A | Discharge status: Home / Self Care | Payer: PRIVATE HEALTH INSURANCE | Source: Home / Self Care | Attending: Emergency Medicine | Admitting: Emergency Medicine

## 2013-02-19 ENCOUNTER — Encounter (HOSPITAL_COMMUNITY): Payer: Self-pay | Admitting: *Deleted

## 2013-02-19 ENCOUNTER — Emergency Department (HOSPITAL_COMMUNITY)
Admission: EM | Admit: 2013-02-19 | Discharge: 2013-02-19 | Disposition: A | Discharge status: Home / Self Care | Payer: PRIVATE HEALTH INSURANCE | Attending: Emergency Medicine | Admitting: Emergency Medicine

## 2013-02-19 ENCOUNTER — Encounter (HOSPITAL_COMMUNITY): Payer: Self-pay | Admitting: Emergency Medicine

## 2013-02-19 DIAGNOSIS — Z79899 Other long term (current) drug therapy: Secondary | ICD-10-CM | POA: Insufficient documentation

## 2013-02-19 DIAGNOSIS — Z862 Personal history of diseases of the blood and blood-forming organs and certain disorders involving the immune mechanism: Secondary | ICD-10-CM | POA: Insufficient documentation

## 2013-02-19 DIAGNOSIS — Z8774 Personal history of (corrected) congenital malformations of heart and circulatory system: Secondary | ICD-10-CM | POA: Insufficient documentation

## 2013-02-19 DIAGNOSIS — Z87738 Personal history of other specified (corrected) congenital malformations of digestive system: Secondary | ICD-10-CM | POA: Insufficient documentation

## 2013-02-19 DIAGNOSIS — J3489 Other specified disorders of nose and nasal sinuses: Secondary | ICD-10-CM | POA: Insufficient documentation

## 2013-02-19 DIAGNOSIS — F411 Generalized anxiety disorder: Secondary | ICD-10-CM | POA: Insufficient documentation

## 2013-02-19 DIAGNOSIS — Z8709 Personal history of other diseases of the respiratory system: Secondary | ICD-10-CM | POA: Insufficient documentation

## 2013-02-19 DIAGNOSIS — R0609 Other forms of dyspnea: Secondary | ICD-10-CM | POA: Insufficient documentation

## 2013-02-19 DIAGNOSIS — Z8739 Personal history of other diseases of the musculoskeletal system and connective tissue: Secondary | ICD-10-CM | POA: Insufficient documentation

## 2013-02-19 DIAGNOSIS — J449 Chronic obstructive pulmonary disease, unspecified: Secondary | ICD-10-CM

## 2013-02-19 DIAGNOSIS — R059 Cough, unspecified: Secondary | ICD-10-CM | POA: Insufficient documentation

## 2013-02-19 DIAGNOSIS — I10 Essential (primary) hypertension: Secondary | ICD-10-CM | POA: Insufficient documentation

## 2013-02-19 DIAGNOSIS — R0989 Other specified symptoms and signs involving the circulatory and respiratory systems: Secondary | ICD-10-CM | POA: Insufficient documentation

## 2013-02-19 DIAGNOSIS — Z8719 Personal history of other diseases of the digestive system: Secondary | ICD-10-CM | POA: Insufficient documentation

## 2013-02-19 DIAGNOSIS — R05 Cough: Secondary | ICD-10-CM | POA: Insufficient documentation

## 2013-02-19 DIAGNOSIS — J4489 Other specified chronic obstructive pulmonary disease: Secondary | ICD-10-CM | POA: Insufficient documentation

## 2013-02-19 DIAGNOSIS — Z8659 Personal history of other mental and behavioral disorders: Secondary | ICD-10-CM | POA: Insufficient documentation

## 2013-02-19 DIAGNOSIS — Z87891 Personal history of nicotine dependence: Secondary | ICD-10-CM | POA: Insufficient documentation

## 2013-02-19 DIAGNOSIS — J441 Chronic obstructive pulmonary disease with (acute) exacerbation: Secondary | ICD-10-CM | POA: Insufficient documentation

## 2013-02-19 DIAGNOSIS — R0600 Dyspnea, unspecified: Secondary | ICD-10-CM

## 2013-02-19 DIAGNOSIS — R062 Wheezing: Secondary | ICD-10-CM | POA: Insufficient documentation

## 2013-02-19 DIAGNOSIS — J3089 Other allergic rhinitis: Secondary | ICD-10-CM | POA: Insufficient documentation

## 2013-02-19 DIAGNOSIS — Z760 Encounter for issue of repeat prescription: Secondary | ICD-10-CM | POA: Insufficient documentation

## 2013-02-19 LAB — BASIC METABOLIC PANEL
BUN: 12 mg/dL (ref 6–23)
Chloride: 99 mEq/L (ref 96–112)
GFR calc Af Amer: >90 mL/min (ref >90)
GFR calc non Af Amer: 87 mL/min — ABNORMAL LOW (ref >90)
Potassium: 4 mEq/L (ref 3.5–5.1)

## 2013-02-19 MED ORDER — ALBUTEROL SULFATE HFA 108 (90 BASE) MCG/ACT IN AERS
2.0000 | INHALATION_SPRAY | RESPIRATORY_TRACT | Status: DC | PRN
  Administered 2013-02-19: 2 via RESPIRATORY_TRACT
  Filled 2013-02-19: qty 6.7

## 2013-02-19 MED ORDER — IPRATROPIUM-ALBUTEROL 20-100 MCG/ACT IN AERS
INHALATION_SPRAY | RESPIRATORY_TRACT | Status: AC
  Filled 2013-02-19: qty 4

## 2013-02-19 MED ORDER — IBUPROFEN 400 MG PO TABS
400.0000 mg | ORAL_TABLET | Freq: Once | ORAL | Status: AC
  Administered 2013-02-19: 400 mg via ORAL
  Filled 2013-02-19: qty 1

## 2013-02-19 MED ORDER — IPRATROPIUM BROMIDE 0.02 % IN SOLN
0.5000 mg | Freq: Once | RESPIRATORY_TRACT | Status: AC
  Administered 2013-02-19: 0.5 mg via RESPIRATORY_TRACT
  Filled 2013-02-19: qty 2.5

## 2013-02-19 MED ORDER — ALBUTEROL SULFATE HFA 108 (90 BASE) MCG/ACT IN AERS
2.0000 | INHALATION_SPRAY | Freq: Four times a day (QID) | RESPIRATORY_TRACT | Status: DC
  Administered 2013-02-19: 2 via RESPIRATORY_TRACT
  Filled 2013-02-19: qty 6.7

## 2013-02-19 MED ORDER — IPRATROPIUM-ALBUTEROL 20-100 MCG/ACT IN AERS
1.0000 | INHALATION_SPRAY | Freq: Once | RESPIRATORY_TRACT | Status: AC
  Administered 2013-02-19: 1 via RESPIRATORY_TRACT
  Filled 2013-02-19: qty 4

## 2013-02-19 MED ORDER — ALBUTEROL SULFATE (5 MG/ML) 0.5% IN NEBU
2.5000 mg | INHALATION_SOLUTION | Freq: Once | RESPIRATORY_TRACT | Status: AC
  Administered 2013-02-19: 2.5 mg via RESPIRATORY_TRACT
  Filled 2013-02-19: qty 0.5

## 2013-02-19 NOTE — ED Notes (Signed)
Patient presents to ER via [facility] with c/o chronic shortness of breath.  Patient states shortness of breath is no different than usual.

## 2013-02-19 NOTE — ED Notes (Signed)
Pt states he has not been able to breath well and symptoms worsened today.

## 2013-02-19 NOTE — ED Notes (Signed)
Pt to department via EMS.  C/o SOB.  Pt has been seen and treated in department for same previously today.

## 2013-02-19 NOTE — ED Provider Notes (Signed)
History  This chart was scribed for Geoffery Lyons, MD by Ardeen Jourdain, ED Scribe. This patient was seen in room APA05/APA05 and the patient's care was started at 1527.  CSN: [id number]  Arrival date & time 02/19/13  1515   First MD Initiated Contact with Patient 02/19/13 1527      Chief Complaint  Patient presents with  . Shortness of Breath     The history is provided by the patient. No language interpreter was used.    [name] is a 79 y.o. male who presents to the Emergency Department complaining of gradual onset, unchanging, constant SOB that began earlier today after walking to the store to get a soda. Pt denies any fevers, chills, nausea, congestion, cough and emesis as associated symptoms. Pt is a frequent visitor of the ED with similar complaints. He is not c/o anything else at this time.   Past Medical History  Diagnosis Date  . COPD (chronic obstructive pulmonary disease)   . Anxiety   . Alcohol abuse   . Anemia   . Generalized headaches   . Hypertension   . 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 Pulmonary nodule/lesion, solitary   . Schizoaffective disorder   . GI bleed     History of recurrent bleeding that dates back as far as 2004 per records,  . DDD (degenerative disc disease), cervical     Past Surgical History  Procedure Laterality Date  . Colonoscopy  Sept 2009    SLF: few small AVMs in ascending colon distal to IC . Ablated via BICAP.   Marland Kitchen Esophagogastroduodenoscopy  Sept 2009    SLF: normal esophagus, small HH, 1-2 AVMs actively oozing in mid stomach, s/p BICAP, path benign   . Esophagogastroduodenoscopy  01/18/2012    Rourk-Erosive reflux esophagitis. Noncritical ring. Antral erosions/ small antral ulcer - appeared innocent  (H pylori serrology negative)  . Colonoscopy  03/09/2012    Procedure: COLONOSCOPY;  Surgeon: West Bali, MD;  Location: [facility];  Service: Endoscopy;   Laterality: N/A;  2:30  . Esophagogastroduodenoscopy  03/09/2012    Procedure: ESOPHAGOGASTRODUODENOSCOPY (EGD);  Surgeon: West Bali, MD;  Location: [facility];  Service: Endoscopy;  Laterality: N/A;    Family History  Problem Relation Age of Onset  . Colon cancer Neg Hx     History  Substance Use Topics  . Smoking status: Former Smoker    Types: Cigarettes    Quit date: 05/07/1969  . Smokeless tobacco: Current User    Types: Snuff  . Alcohol Use: 0.0 oz/week     Comment: "everyday"      Review of Systems  Constitutional: Negative for fever and chills.  Respiratory: Positive for shortness of breath and wheezing. Negative for cough.   Gastrointestinal: Negative for nausea, vomiting and diarrhea.  Neurological: Negative for weakness.  All other systems reviewed and are negative.    Allergies  Black pepper  Home Medications   Current Outpatient Rx  Name  Route  Sig  Dispense  Refill  . acetaminophen (TYLENOL) 500 MG tablet   Oral   Take 500 mg by mouth daily as needed for pain.         Marland Kitchen albuterol (PROVENTIL HFA;VENTOLIN HFA) 108 (90 BASE) MCG/ACT inhaler   Inhalation   Inhale 2 puffs into the lungs every 6 (six) hours as needed for wheezing or shortness of breath.         Marland Kitchen  albuterol (PROVENTIL) (2.5 MG/3ML) 0.083% nebulizer solution   Nebulization   Take 2.5 mg by nebulization every 6 (six) hours as needed for wheezing or shortness of breath.         . diazepam (VALIUM) 5 MG tablet   Oral   Take 5 mg by mouth 3 (three) times daily.         . diphenhydrAMINE (BENADRYL) 25 mg capsule   Oral   Take 25 mg by mouth every 6 (six) hours as needed for sleep.         Marland Kitchen HYDROcodone-acetaminophen (NORCO/VICODIN) 5-325 MG per tablet   Oral   Take 1 tablet by mouth 4 (four) times daily.         Marland Kitchen loratadine (CLARITIN) 10 MG tablet   Oral   Take 1 tablet (10 mg total) by mouth daily. One po daily x 5 days   5 tablet   0   . methocarbamol  (ROBAXIN) 500 MG tablet   Oral   Take 1 tablet (500 mg total) by mouth 4 (four) times daily as needed (muscle spasm).   12 tablet   0   . predniSONE (DELTASONE) 20 MG tablet   Oral   Take 2 tablets (40 mg total) by mouth daily.   10 tablet   0     Triage Vitals: BP 140/73  Pulse 105  Temp(Src) 97.2 F (36.2 C) (Oral)  Resp 26  SpO2 97%  Physical Exam  Nursing note and vitals reviewed. Constitutional: He is oriented to person, place, and time. He appears well-developed and well-nourished. No distress.  HENT:  Head: Normocephalic and atraumatic.  Eyes: EOM are normal. Pupils are equal, round, and reactive to light.  Neck: Normal range of motion. Neck supple. No tracheal deviation present.  Cardiovascular: Normal rate, regular rhythm and normal heart sounds.  Exam reveals no gallop and no friction rub.   No murmur heard. Pulmonary/Chest: He is in respiratory distress. He has wheezes. He has no rales. He exhibits no tenderness.  Scattered wheezes bilaterally with limited air movement, mild respiratory distress   Abdominal: Soft. Bowel sounds are normal. He exhibits no distension.  Musculoskeletal: Normal range of motion. He exhibits no edema.  Neurological: He is alert and oriented to person, place, and time.  Skin: Skin is warm and dry. He is not diaphoretic.  Psychiatric: He has a normal mood and affect. His behavior is normal.    ED Course  Procedures (including critical care time)  DIAGNOSTIC STUDIES: Oxygen Saturation is 97% on room air, normal by my interpretation.    COORDINATION OF CARE:  3:34 PM-Discussed treatment plan which includes albuterol breathing treatment with pt at bedside and pt agreed to plan.    Labs Reviewed - No data to display Dg Chest 2 View  02/19/2013  *RADIOLOGY REPORT*  Clinical Data: Shortness of breath and tachycardia.  COPD and hypertension.  CHEST - 2 VIEW  Comparison: 02/18/2013 at 1526 hours.  Findings: Hyperinflation/COPD. Lateral  view degraded by patient arm position.  Midline trachea.  Normal heart size and mediastinal contours for age.  No pleural effusion or pneumothorax.  Suspect calcified granulomas bilaterally.  IMPRESSION: COPD/chronic bronchitis, without acute superimposed process.   Original Report Authenticated By: Jeronimo Greaves, M.D.    Dg Chest 2 View  02/18/2013  *RADIOLOGY REPORT*  Clinical Data: Shortness of breath  CHEST - 2 VIEW  Comparison: 02/16/2013  Findings: The heart and pulmonary vascularity are within normal limits.  Mild chronic interstitial  changes are again seen.  No focal confluent infiltrate is noted.  Mild hyperinflation is noted. No bony abnormality is seen.  IMPRESSION: No acute abnormality noted.   Original Report Authenticated By: Alcide Clever, M.D.      No diagnosis found.    MDM  Feels better with neb, wants to go home.  Sats are 97%, and he looks, feels, and sounds better.  Will discharge with mdi, to return prn.      I personally performed the services described in this documentation, which was scribed in my presence. The recorded information has been reviewed and is accurate.      Geoffery Lyons, MD 02/19/13 2141

## 2013-02-19 NOTE — ED Provider Notes (Signed)
History  This chart was scribed for Loren Racer, MD by Shari Heritage, ED Scribe. The patient was seen in room APA17/APA17. Patient's care was started at 2052.   CSN: [id number]  Arrival date & time 02/19/13  2040   First MD Initiated Contact with Patient 02/19/13 2052      Chief Complaint  Patient presents with  . Shortness of Breath     Patient is a 79 y.o. male presenting with shortness of breath. The history is provided by the patient. No language interpreter was used.  Shortness of Breath Severity:  Moderate Timing:  Constant Progression:  Unchanged Chronicity:  Chronic Associated symptoms: no abdominal pain, no chest pain, no fever, no headaches, no rash and no sore throat      HPI Comments: Marios Gaiser is a 79 y.o. male with history of COPD who presents to the Emergency Department complaining of chronic shortness of breath and dyspnea on exertion. He states that shortness of breath is exacerbated by pollen and seasonal allergies. Associated symptoms include cough and rhinorrhea. Patient states that he is out of his inhalers and is requesting Combivent. Patient has had frequent visits to the ED for the same. He was seen here today already and twice yesterday.    Past Medical History  Diagnosis Date  . COPD (chronic obstructive pulmonary disease)   . Anxiety   . Alcohol abuse   . Anemia   . Generalized headaches   . Hypertension   . 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 Pulmonary nodule/lesion, solitary   . Schizoaffective disorder   . GI bleed     History of recurrent bleeding that dates back as far as 2004 per records,  . DDD (degenerative disc disease), cervical     Past Surgical History  Procedure Laterality Date  . Colonoscopy  Sept 2009    SLF: few small AVMs in ascending colon distal to IC . Ablated via BICAP.   Marland Kitchen Esophagogastroduodenoscopy  Sept 2009    SLF: normal esophagus, small HH, 1-2  AVMs actively oozing in mid stomach, s/p BICAP, path benign   . Esophagogastroduodenoscopy  01/18/2012    Rourk-Erosive reflux esophagitis. Noncritical ring. Antral erosions/ small antral ulcer - appeared innocent  (H pylori serrology negative)  . Colonoscopy  03/09/2012    Procedure: COLONOSCOPY;  Surgeon: West Bali, MD;  Location: [facility];  Service: Endoscopy;  Laterality: N/A;  2:30  . Esophagogastroduodenoscopy  03/09/2012    Procedure: ESOPHAGOGASTRODUODENOSCOPY (EGD);  Surgeon: West Bali, MD;  Location: [facility];  Service: Endoscopy;  Laterality: N/A;    Family History  Problem Relation Age of Onset  . Colon cancer Neg Hx     History  Substance Use Topics  . Smoking status: Former Smoker    Types: Cigarettes    Quit date: 05/07/1969  . Smokeless tobacco: Current User    Types: Snuff  . Alcohol Use: 0.0 oz/week     Comment: "everyday"      Review of Systems  Constitutional: Negative for fever.  HENT: Negative for sore throat.   Eyes: Negative for visual disturbance.  Respiratory: Positive for shortness of breath.   Cardiovascular: Negative for chest pain.  Gastrointestinal: Negative for abdominal pain.  Genitourinary: Negative for difficulty urinating.  Skin: Negative for rash.  Neurological: Negative for headaches.  All other systems reviewed and are negative.    Allergies  Black  pepper  Home Medications   Current Outpatient Rx  Name  Route  Sig  Dispense  Refill  . albuterol (PROVENTIL HFA;VENTOLIN HFA) 108 (90 BASE) MCG/ACT inhaler   Inhalation   Inhale 2 puffs into the lungs every 6 (six) hours as needed for wheezing or shortness of breath.         Marland Kitchen albuterol (PROVENTIL) (2.5 MG/3ML) 0.083% nebulizer solution   Nebulization   Take 2.5 mg by nebulization every 6 (six) hours as needed for wheezing or shortness of breath.         . diazepam (VALIUM) 5 MG tablet   Oral   Take 5 mg by mouth 3 (three) times daily.         Marland Kitchen  HYDROcodone-acetaminophen (NORCO/VICODIN) 5-325 MG per tablet   Oral   Take 1 tablet by mouth 4 (four) times daily.         Marland Kitchen loratadine (CLARITIN) 10 MG tablet   Oral   Take 1 tablet (10 mg total) by mouth daily. One po daily x 5 days   5 tablet   0   . methocarbamol (ROBAXIN) 500 MG tablet   Oral   Take 1 tablet (500 mg total) by mouth 4 (four) times daily as needed (muscle spasm).   12 tablet   0   . predniSONE (DELTASONE) 20 MG tablet   Oral   Take 2 tablets (40 mg total) by mouth daily.   10 tablet   0     There were no vitals taken for this visit.  Physical Exam  Constitutional: He is oriented to person, place, and time. He appears well-developed and well-nourished.  HENT:  Head: Normocephalic and atraumatic.  Mouth/Throat: Oropharynx is clear and moist.  Eyes: Conjunctivae and EOM are normal. Pupils are equal, round, and reactive to light.  Neck: Normal range of motion. Neck supple.  Cardiovascular: Normal rate, regular rhythm and normal heart sounds.   Pulmonary/Chest: Effort normal. He has wheezes (faint, expiratory).  Abdominal: Soft. There is no tenderness.  Musculoskeletal: Normal range of motion.  Neurological: He is alert and oriented to person, place, and time.  Skin: Skin is warm and dry.    ED Course  Procedures (including critical care time)  COORDINATION OF CARE: 9:03 PM- Patient informed of current plan for treatment and evaluation and agrees with plan at this time.    1. Medication refill       MDM  I personally performed the services described in this documentation, which was scribed in my presence. The recorded information has been reviewed and is accurate.  Medically screened without acute emergent findings  Loren Racer, MD 02/19/13 2208

## 2013-02-19 NOTE — ED Notes (Signed)
Pt has been wanded by security. 

## 2013-02-19 NOTE — ED Notes (Signed)
Pt states SOB after walking to the store and got a soda and some nabs

## 2013-02-20 ENCOUNTER — Encounter (HOSPITAL_COMMUNITY): Payer: Self-pay | Admitting: *Deleted

## 2013-02-20 ENCOUNTER — Emergency Department (HOSPITAL_COMMUNITY)
Admission: EM | Admit: 2013-02-20 | Discharge: 2013-02-20 | Disposition: A | Discharge status: Home / Self Care | Payer: PRIVATE HEALTH INSURANCE | Attending: Emergency Medicine | Admitting: Emergency Medicine

## 2013-02-20 DIAGNOSIS — Z8709 Personal history of other diseases of the respiratory system: Secondary | ICD-10-CM | POA: Insufficient documentation

## 2013-02-20 DIAGNOSIS — Z87891 Personal history of nicotine dependence: Secondary | ICD-10-CM | POA: Insufficient documentation

## 2013-02-20 DIAGNOSIS — R0989 Other specified symptoms and signs involving the circulatory and respiratory systems: Secondary | ICD-10-CM | POA: Insufficient documentation

## 2013-02-20 DIAGNOSIS — Z8659 Personal history of other mental and behavioral disorders: Secondary | ICD-10-CM | POA: Insufficient documentation

## 2013-02-20 DIAGNOSIS — R0600 Dyspnea, unspecified: Secondary | ICD-10-CM

## 2013-02-20 DIAGNOSIS — F411 Generalized anxiety disorder: Secondary | ICD-10-CM | POA: Insufficient documentation

## 2013-02-20 DIAGNOSIS — Z8739 Personal history of other diseases of the musculoskeletal system and connective tissue: Secondary | ICD-10-CM | POA: Insufficient documentation

## 2013-02-20 DIAGNOSIS — Z87738 Personal history of other specified (corrected) congenital malformations of digestive system: Secondary | ICD-10-CM | POA: Insufficient documentation

## 2013-02-20 DIAGNOSIS — J449 Chronic obstructive pulmonary disease, unspecified: Secondary | ICD-10-CM | POA: Insufficient documentation

## 2013-02-20 DIAGNOSIS — R0609 Other forms of dyspnea: Secondary | ICD-10-CM | POA: Insufficient documentation

## 2013-02-20 DIAGNOSIS — I10 Essential (primary) hypertension: Secondary | ICD-10-CM | POA: Insufficient documentation

## 2013-02-20 DIAGNOSIS — Z79899 Other long term (current) drug therapy: Secondary | ICD-10-CM | POA: Insufficient documentation

## 2013-02-20 DIAGNOSIS — Z862 Personal history of diseases of the blood and blood-forming organs and certain disorders involving the immune mechanism: Secondary | ICD-10-CM | POA: Insufficient documentation

## 2013-02-20 DIAGNOSIS — J4489 Other specified chronic obstructive pulmonary disease: Secondary | ICD-10-CM | POA: Insufficient documentation

## 2013-02-20 DIAGNOSIS — R000 Tachycardia, unspecified: Secondary | ICD-10-CM | POA: Insufficient documentation

## 2013-02-20 DIAGNOSIS — F0390 Unspecified dementia without behavioral disturbance: Secondary | ICD-10-CM | POA: Insufficient documentation

## 2013-02-20 DIAGNOSIS — Z8719 Personal history of other diseases of the digestive system: Secondary | ICD-10-CM | POA: Insufficient documentation

## 2013-02-20 NOTE — ED Notes (Signed)
Pt resting quietly, no distress noted

## 2013-02-20 NOTE — ED Notes (Addendum)
Pt escorted from department by [employer].

## 2013-02-20 NOTE — ED Provider Notes (Signed)
History     CSN: [id number]  Arrival date & time 02/20/13  8756   First MD Initiated Contact with Patient 02/20/13 [phone]      Chief Complaint  Patient presents with  . Shortness of Breath    (Consider location/radiation/quality/duration/timing/severity/associated sxs/prior treatment) HPI [name] is a 79 y.o. male presenting to the ER for shortness of breath. Patient has a history of dementia, anxiety and COPD and has been seen in the emergency department multiple times in the last 4 days for shortness of breath. Patient has been treated for COPD exacerbation given inhalers and steroids. Patient is well-appearing, just used his albuterol inhaler in the waiting room, and has no new complaints.  Patient initially complained about shortness of breath, but says it's resolved since he's gotten back to his ER room. His symptoms have been intermittent, moderate to severe, alleviated with his inhaler.  No new symptoms, no chest pain, abdominal pain, nausea vomiting, diarrhea, fever or chills.   Past Medical History  Diagnosis Date  . COPD (chronic obstructive pulmonary disease)   . Anxiety   . Alcohol abuse   . Anemia   . Generalized headaches   . Hypertension   . 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 Pulmonary nodule/lesion, solitary   . Schizoaffective disorder   . GI bleed     History of recurrent bleeding that dates back as far as 2004 per records,  . DDD (degenerative disc disease), cervical     Past Surgical History  Procedure Laterality Date  . Colonoscopy  Sept 2009    SLF: few small AVMs in ascending colon distal to IC . Ablated via BICAP.   Marland Kitchen Esophagogastroduodenoscopy  Sept 2009    SLF: normal esophagus, small HH, 1-2 AVMs actively oozing in mid stomach, s/p BICAP, path benign   . Esophagogastroduodenoscopy  01/18/2012    Rourk-Erosive reflux esophagitis. Noncritical ring. Antral erosions/ small antral ulcer -  appeared innocent  (H pylori serrology negative)  . Colonoscopy  03/09/2012    Procedure: COLONOSCOPY;  Surgeon: West Bali, MD;  Location: [facility];  Service: Endoscopy;  Laterality: N/A;  2:30  . Esophagogastroduodenoscopy  03/09/2012    Procedure: ESOPHAGOGASTRODUODENOSCOPY (EGD);  Surgeon: West Bali, MD;  Location: [facility];  Service: Endoscopy;  Laterality: N/A;    Family History  Problem Relation Age of Onset  . Colon cancer Neg Hx     History  Substance Use Topics  . Smoking status: Former Smoker    Types: Cigarettes    Quit date: 05/07/1969  . Smokeless tobacco: Current User    Types: Snuff  . Alcohol Use: 0.0 oz/week     Comment: "everyday"      Review of Systems At least 10pt or greater review of systems completed and are negative except where specified in the HPI.  Allergies  Black pepper  Home Medications   Current Outpatient Rx  Name  Route  Sig  Dispense  Refill  . albuterol (PROVENTIL HFA;VENTOLIN HFA) 108 (90 BASE) MCG/ACT inhaler   Inhalation   Inhale 2 puffs into the lungs every 6 (six) hours as needed for wheezing or shortness of breath.         Marland Kitchen albuterol (PROVENTIL) (2.5 MG/3ML) 0.083% nebulizer solution   Nebulization   Take 2.5 mg by nebulization every 6 (six) hours as needed for wheezing or shortness of breath.         Marland Kitchen  diazepam (VALIUM) 5 MG tablet   Oral   Take 5 mg by mouth 3 (three) times daily.         Marland Kitchen HYDROcodone-acetaminophen (NORCO/VICODIN) 5-325 MG per tablet   Oral   Take 1 tablet by mouth 4 (four) times daily.         Marland Kitchen loratadine (CLARITIN) 10 MG tablet   Oral   Take 1 tablet (10 mg total) by mouth daily. One po daily x 5 days   5 tablet   0   . methocarbamol (ROBAXIN) 500 MG tablet   Oral   Take 1 tablet (500 mg total) by mouth 4 (four) times daily as needed (muscle spasm).   12 tablet   0   . predniSONE (DELTASONE) 20 MG tablet   Oral   Take 2 tablets (40 mg total) by mouth daily.   10  tablet   0     BP 126/59  Pulse 95  Temp(Src) 98.4 F (36.9 C) (Oral)  Resp 20  SpO2 96%  Physical Exam  Nursing notes reviewed.  Electronic medical record reviewed. VITAL SIGNS:   Filed Vitals:   02/20/13 0405 02/20/13 0445  BP: 155/102 126/59  Pulse: 111 95  Temp: 98.4 F (36.9 C)   TempSrc: Oral   Resp: 20   SpO2: 95% 96%   CONSTITUTIONAL: Awake, oriented, appears non-toxic HENT: Atraumatic, normocephalic, oral mucosa pink and moist, airway patent. Nares patent without drainage. External ears normal. EYES: Conjunctiva clear, EOMI, PERRLA NECK: Trachea midline, non-tender, supple CARDIOVASCULAR: Normal heart rate, Normal rhythm, No murmurs, rubs, gallops PULMONARY/CHEST: Clear to auscultation, no rhonchi, wheezes, or rales. Symmetrical breath sounds. Non-tender. ABDOMINAL: Non-distended, soft, non-tender - no rebound or guarding.  BS normal. NEUROLOGIC: Non-focal, moving all four extremities, no gross sensory or motor deficits. EXTREMITIES: No clubbing, cyanosis, or edema SKIN: Warm, Dry, No erythema, No rash  ED Course  Procedures (including critical care time)  Date: 02/20/2013  Rate: 100  Rhythm: Sinus tachycardia  QRS Axis: normal  Intervals: normal  ST/T Wave abnormalities: normal  Conduction Disutrbances: none  Narrative Interpretation: Occasional PACs occasional PVCs, no change from prior EKG dated 02/13/2013-nonischemic EKG     Labs Reviewed - No data to display Dg Chest 2 View  02/19/2013  *RADIOLOGY REPORT*  Clinical Data: Shortness of breath and tachycardia.  COPD and hypertension.  CHEST - 2 VIEW  Comparison: 02/18/2013 at 1526 hours.  Findings: Hyperinflation/COPD. Lateral view degraded by patient arm position.  Midline trachea.  Normal heart size and mediastinal contours for age.  No pleural effusion or pneumothorax.  Suspect calcified granulomas bilaterally.  IMPRESSION: COPD/chronic bronchitis, without acute superimposed process.   Original Report  Authenticated By: Jeronimo Greaves, M.D.    Dg Chest 2 View  02/18/2013  *RADIOLOGY REPORT*  Clinical Data: Shortness of breath  CHEST - 2 VIEW  Comparison: 02/16/2013  Findings: The heart and pulmonary vascularity are within normal limits.  Mild chronic interstitial changes are again seen.  No focal confluent infiltrate is noted.  Mild hyperinflation is noted. No bony abnormality is seen.  IMPRESSION: No acute abnormality noted.   Original Report Authenticated By: Alcide Clever, M.D.      1. Dyspnea       MDM  Patient presents with chronic dyspnea, patient has COPD, was seen earlier for the same complaint. Patient is tachycardic on exam this is likely secondary to his use of albuterol. He is not wheezing on my physical exam, he is in no acute  distress. EKG is unchanged from prior, or occasional PACs and occasional PVCs, no ischemic changes.  I do not think further investigation is indicated at this time. I doubt ACS, I doubt any other intrathoracic emergency at this time.  Patient is encouraged to use his inhaler as needed for shortness of breath, and followup with his primary care physician Dr. Juanetta Gosling - this patient may require placement secondary to his repeated use of the emergency department for similar complaints think there is a big component of anxiety and dementia in the patient's chief complaints.    I explained the diagnosis and have given explicit precautions to return to the ER including chest pain any other new or worsening symptoms. The patient understands and accepts the medical plan as it's been dictated and I have answered their questions. Discharge instructions concerning home care and prescriptions have been given.  The patient is STABLE and is discharged to home in good condition.          [name], MD 02/21/13 2250

## 2013-02-20 NOTE — ED Notes (Addendum)
Pt c/o continued SOB, non-compliant with instructions.  Pt yelling and aggressive with staff. Pt has been seen, evaluated, treated and discharged. Pt continues to leave department. [employer] notified.

## 2013-02-20 NOTE — ED Notes (Signed)
Pt states "I'm breathing a lot better since getting back here".  No distress noted.

## 2013-02-20 NOTE — ED Notes (Signed)
Pt c/o SOB.  Pt seen previously this evening for same.  Pt given combivent inhaler on previous visit.

## 2013-02-22 IMAGING — CR DG CHEST 2V
2 series · 2 of 2 positions shown · non-contrast
Comparison: 09/18/2012.

CLINICAL DATA: Shortness of breath.  History of COPD and
hypertension.

CHEST - 2 VIEW

[view not recorded (1 of 2)]
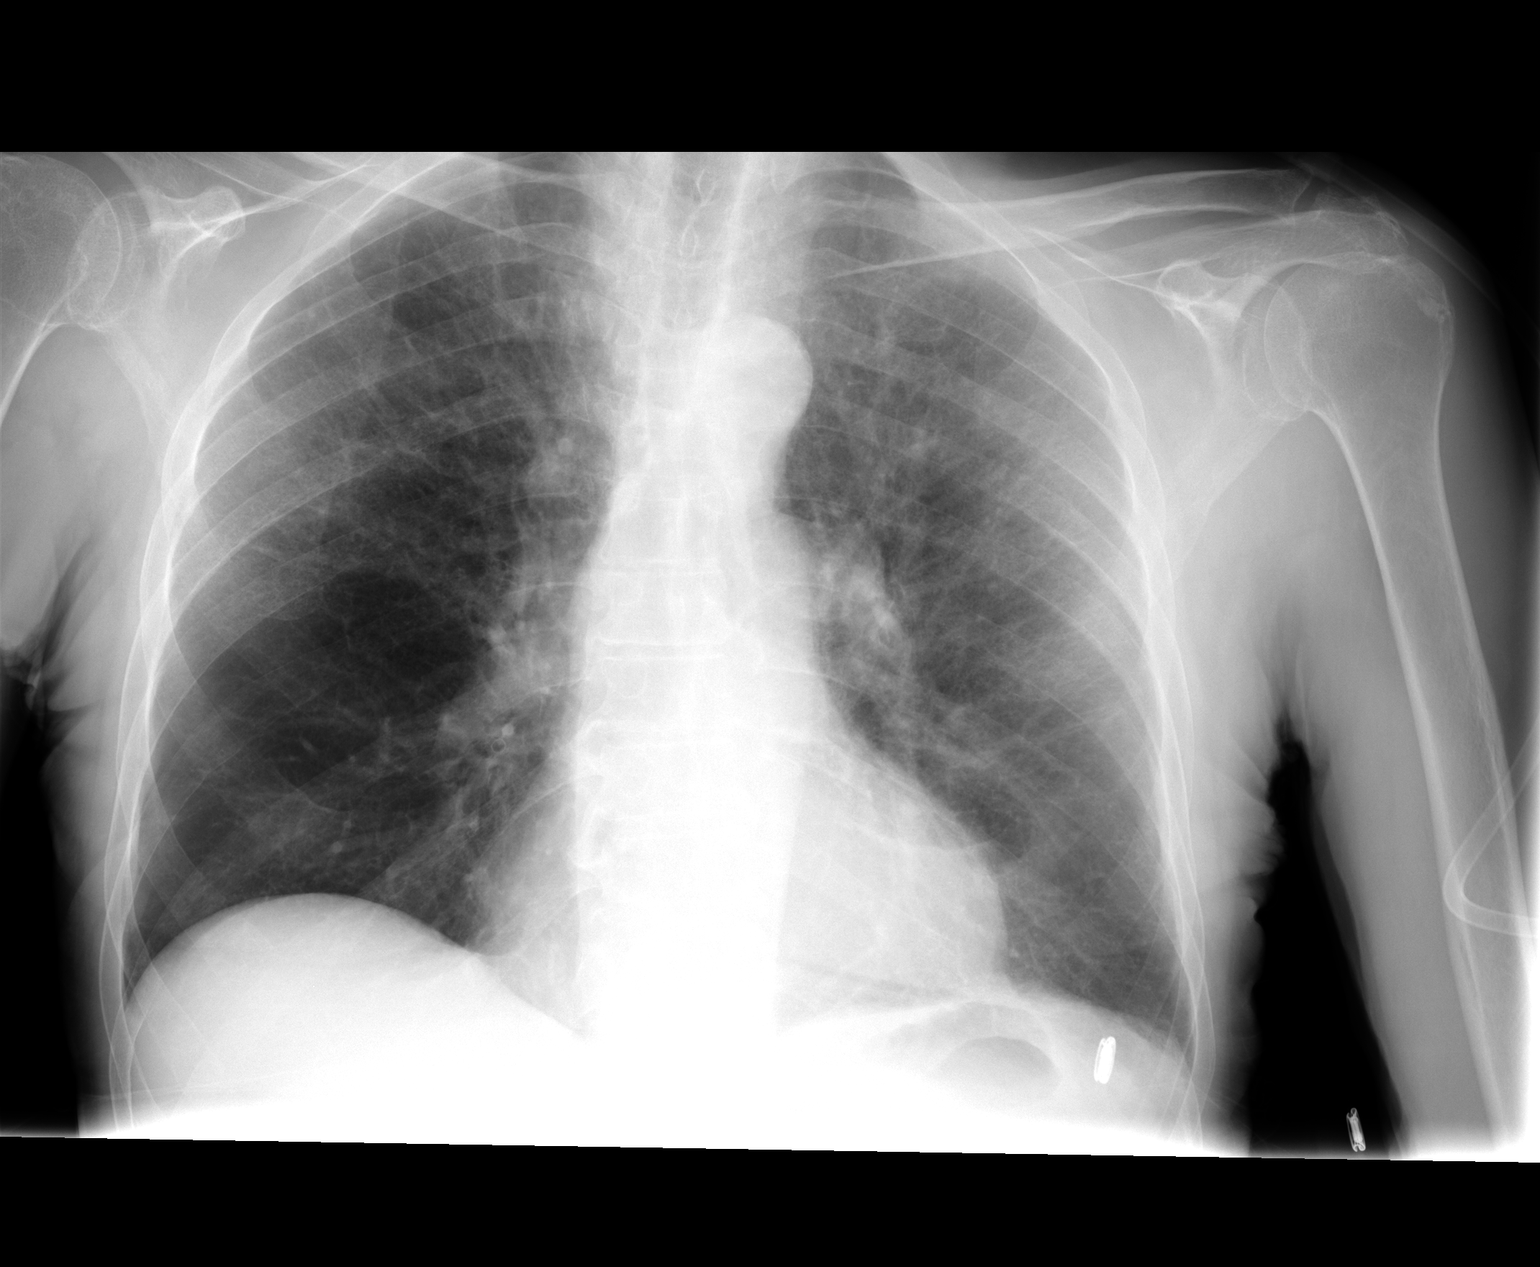

[view not recorded (2 of 2)]
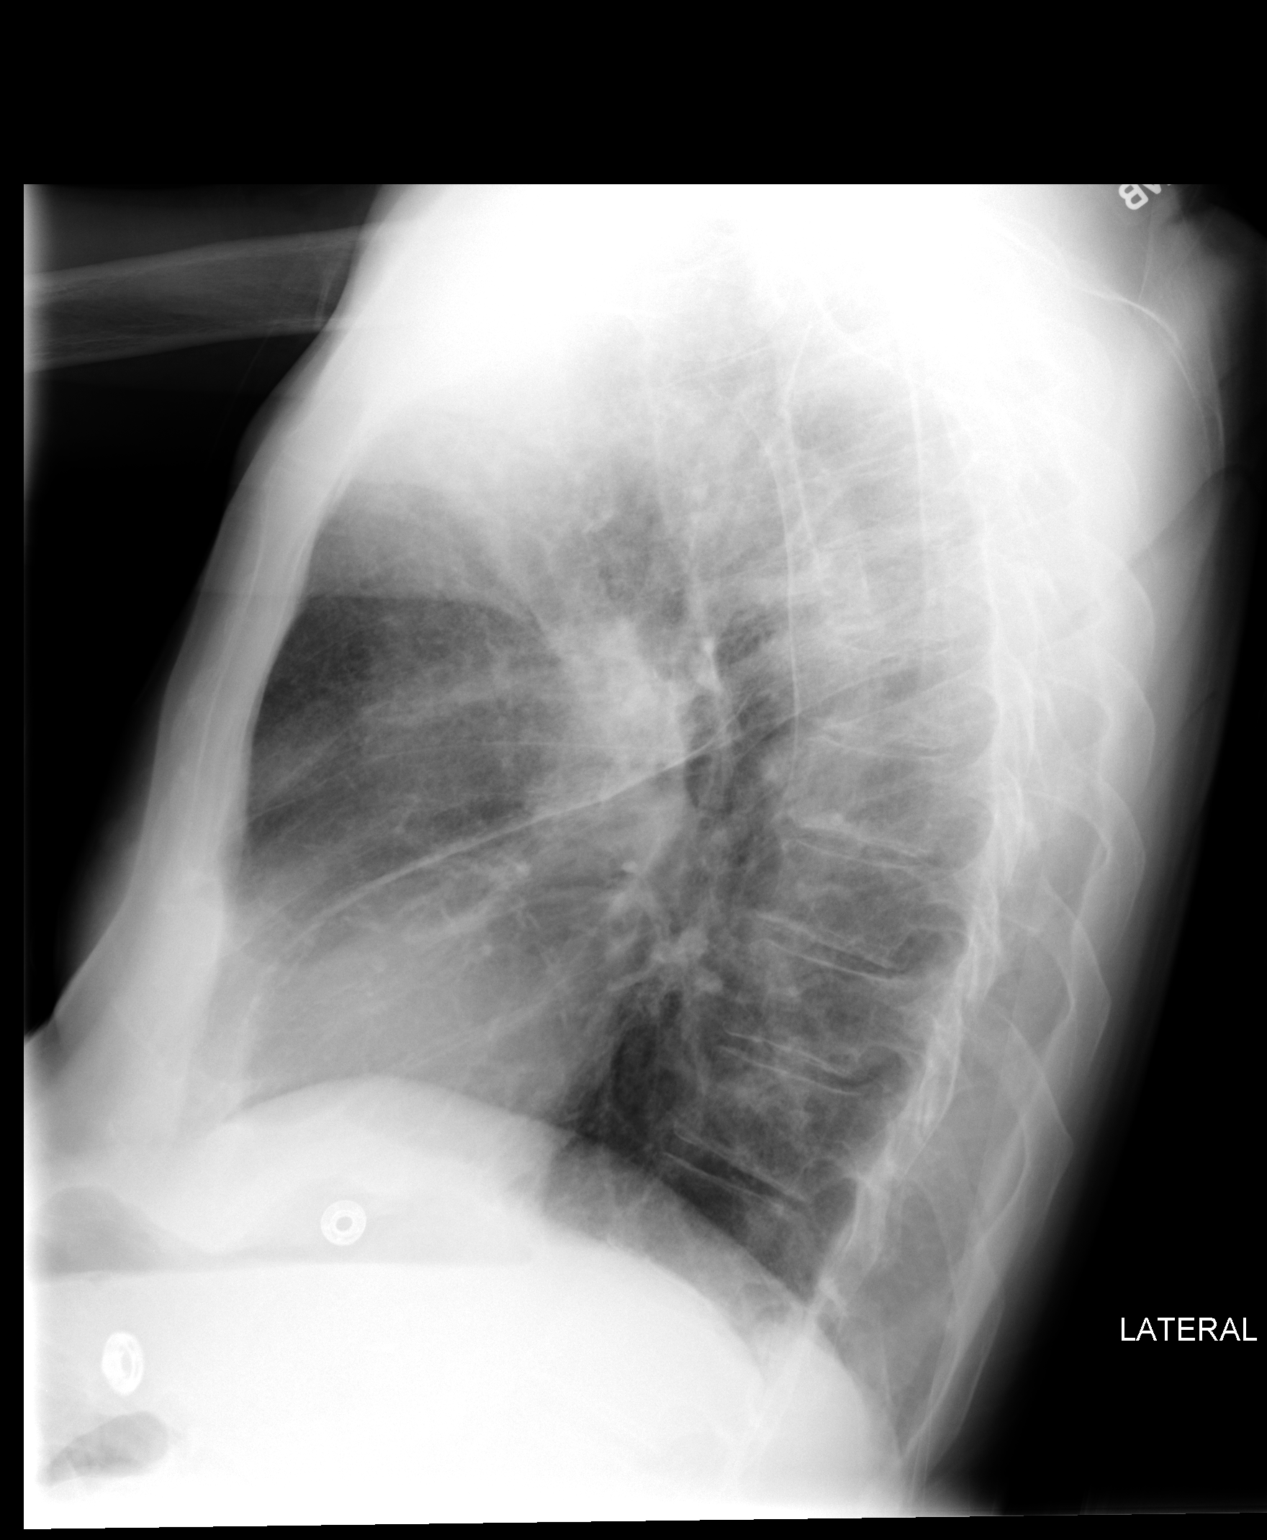

[2 of 2 positions shown; findings below may reference images not displayed]

FINDINGS: The heart remains normal in size.  The interstitial
markings remain mildly prominent with hyper lucency in the right
lower lung zone.  The bones appear osteopenic.  Thoracic spine
degenerative changes.
IMPRESSION: Stable changes of COPD.  No acute abnormality.

## 2013-03-10 IMAGING — CR DG CHEST 2V
2 series · 2 of 2 positions shown · non-contrast
Comparison: 10/05/2012

CLINICAL DATA: Cough and shortness of breath.

CHEST - 2 VIEW

[view not recorded (1 of 2)]
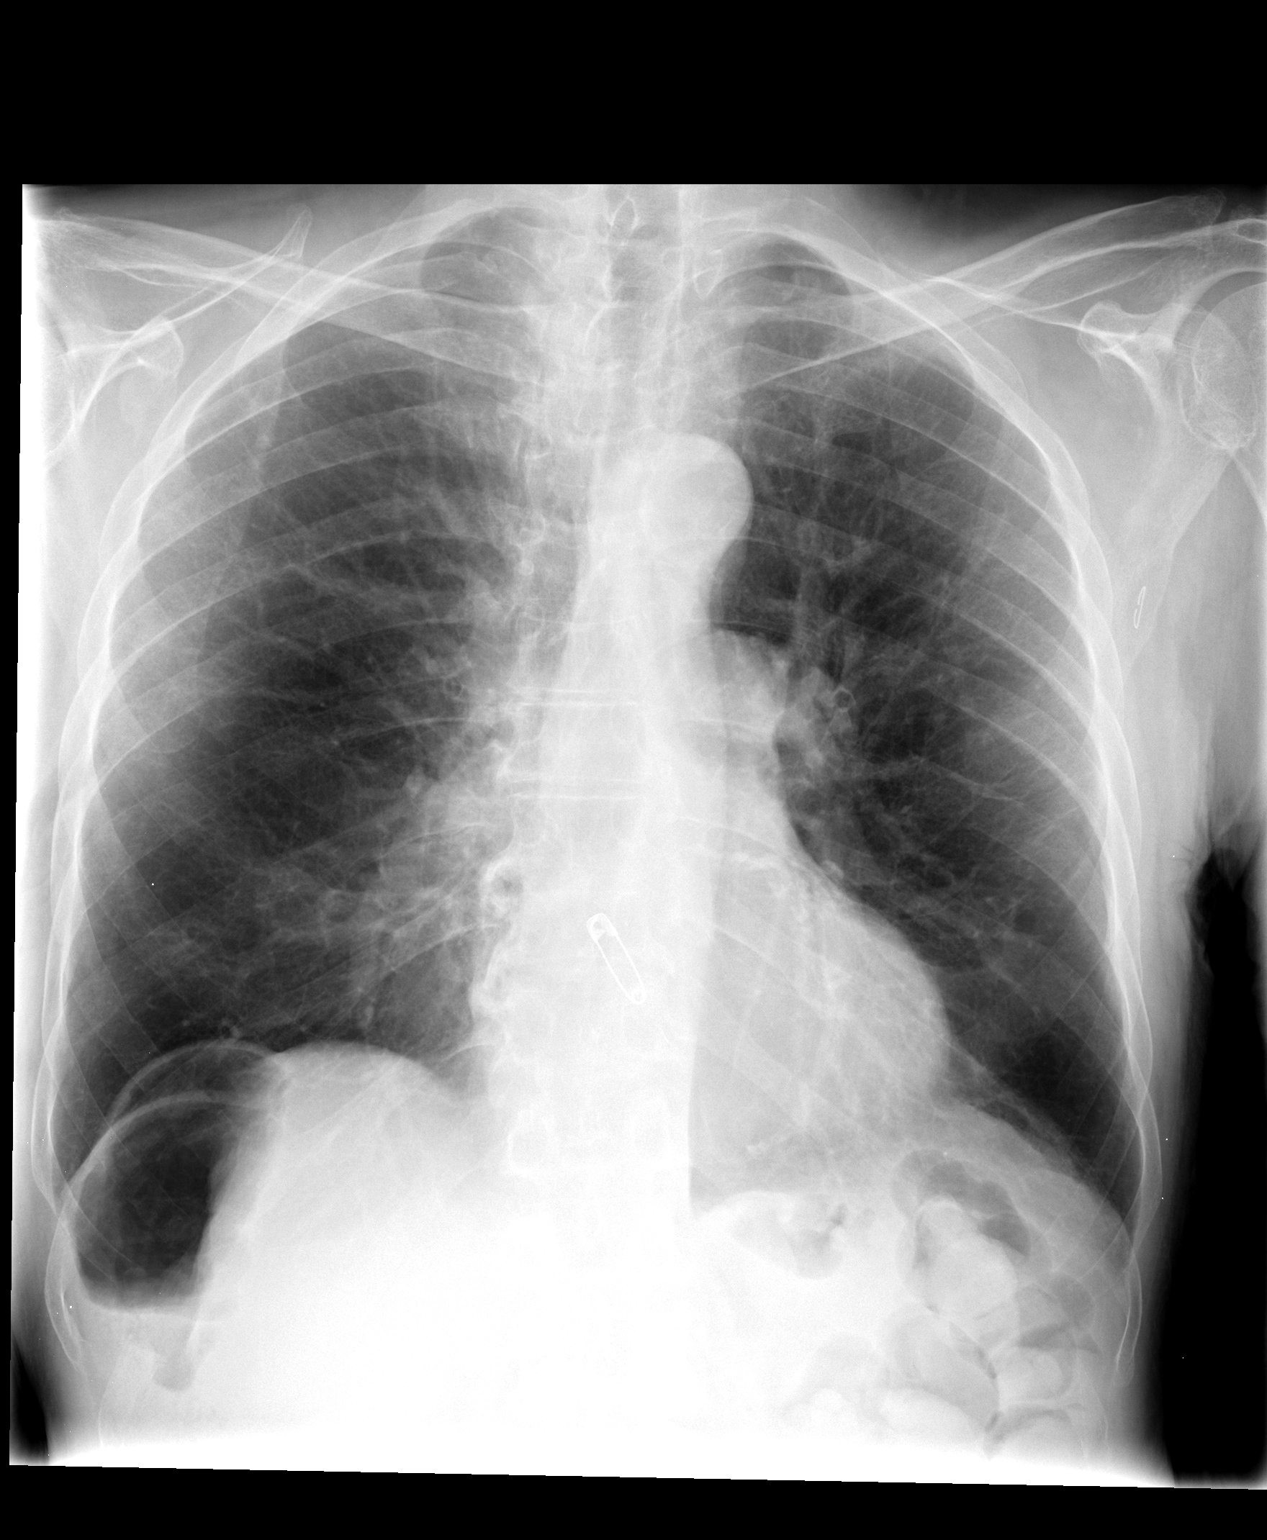

[view not recorded (2 of 2)]
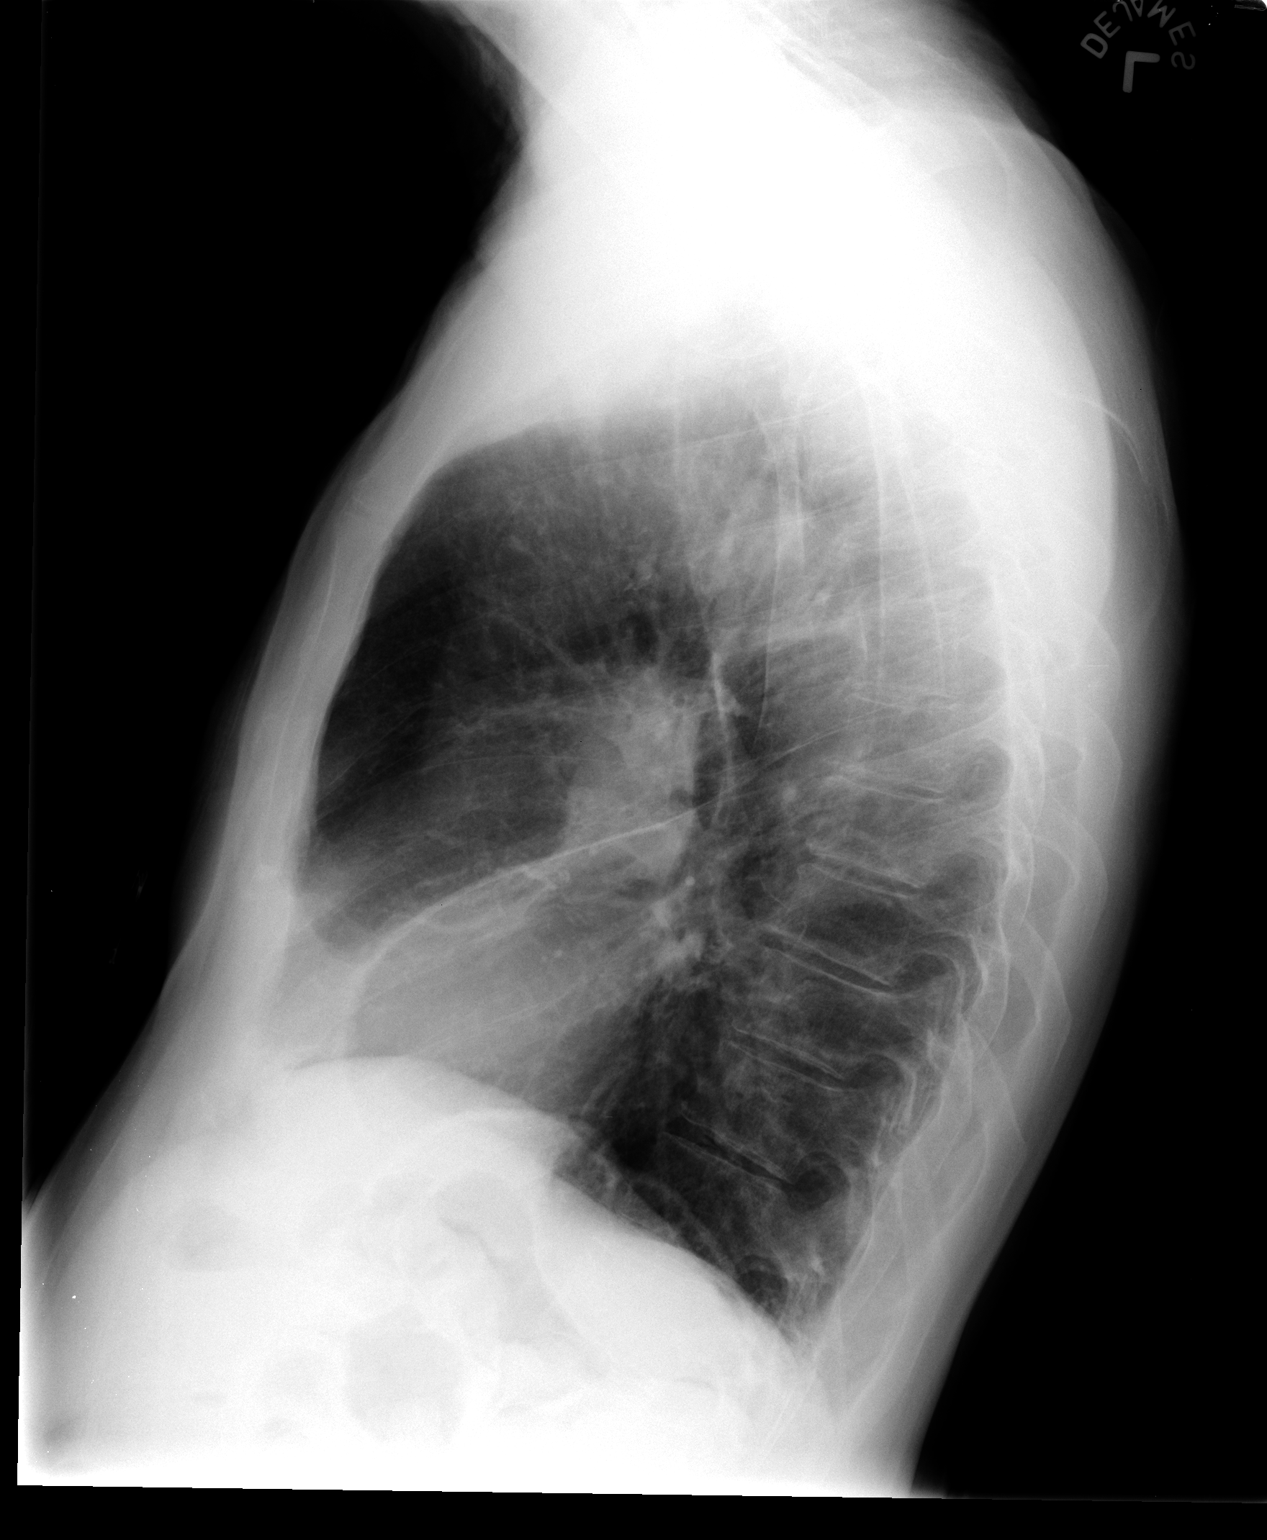

[2 of 2 positions shown; findings below may reference images not displayed]

FINDINGS: Two views of the chest were obtained.  Lungs remain clear
without focal airspace disease or edema.  Heart size is stable.
There may be chronic thickening along the inferior left major
fissure.  The bony thorax is intact. There appears to be a small
calcified granuloma in the right upper lung.
IMPRESSION: No acute chest findings.

## 2013-03-19 IMAGING — CR DG CHEST 2V
2 series · 2 of 2 positions shown · non-contrast
Comparison: 10/21/2012.

CLINICAL DATA: Shortness of breath.

CHEST - 2 VIEW

[view not recorded (1 of 2)]
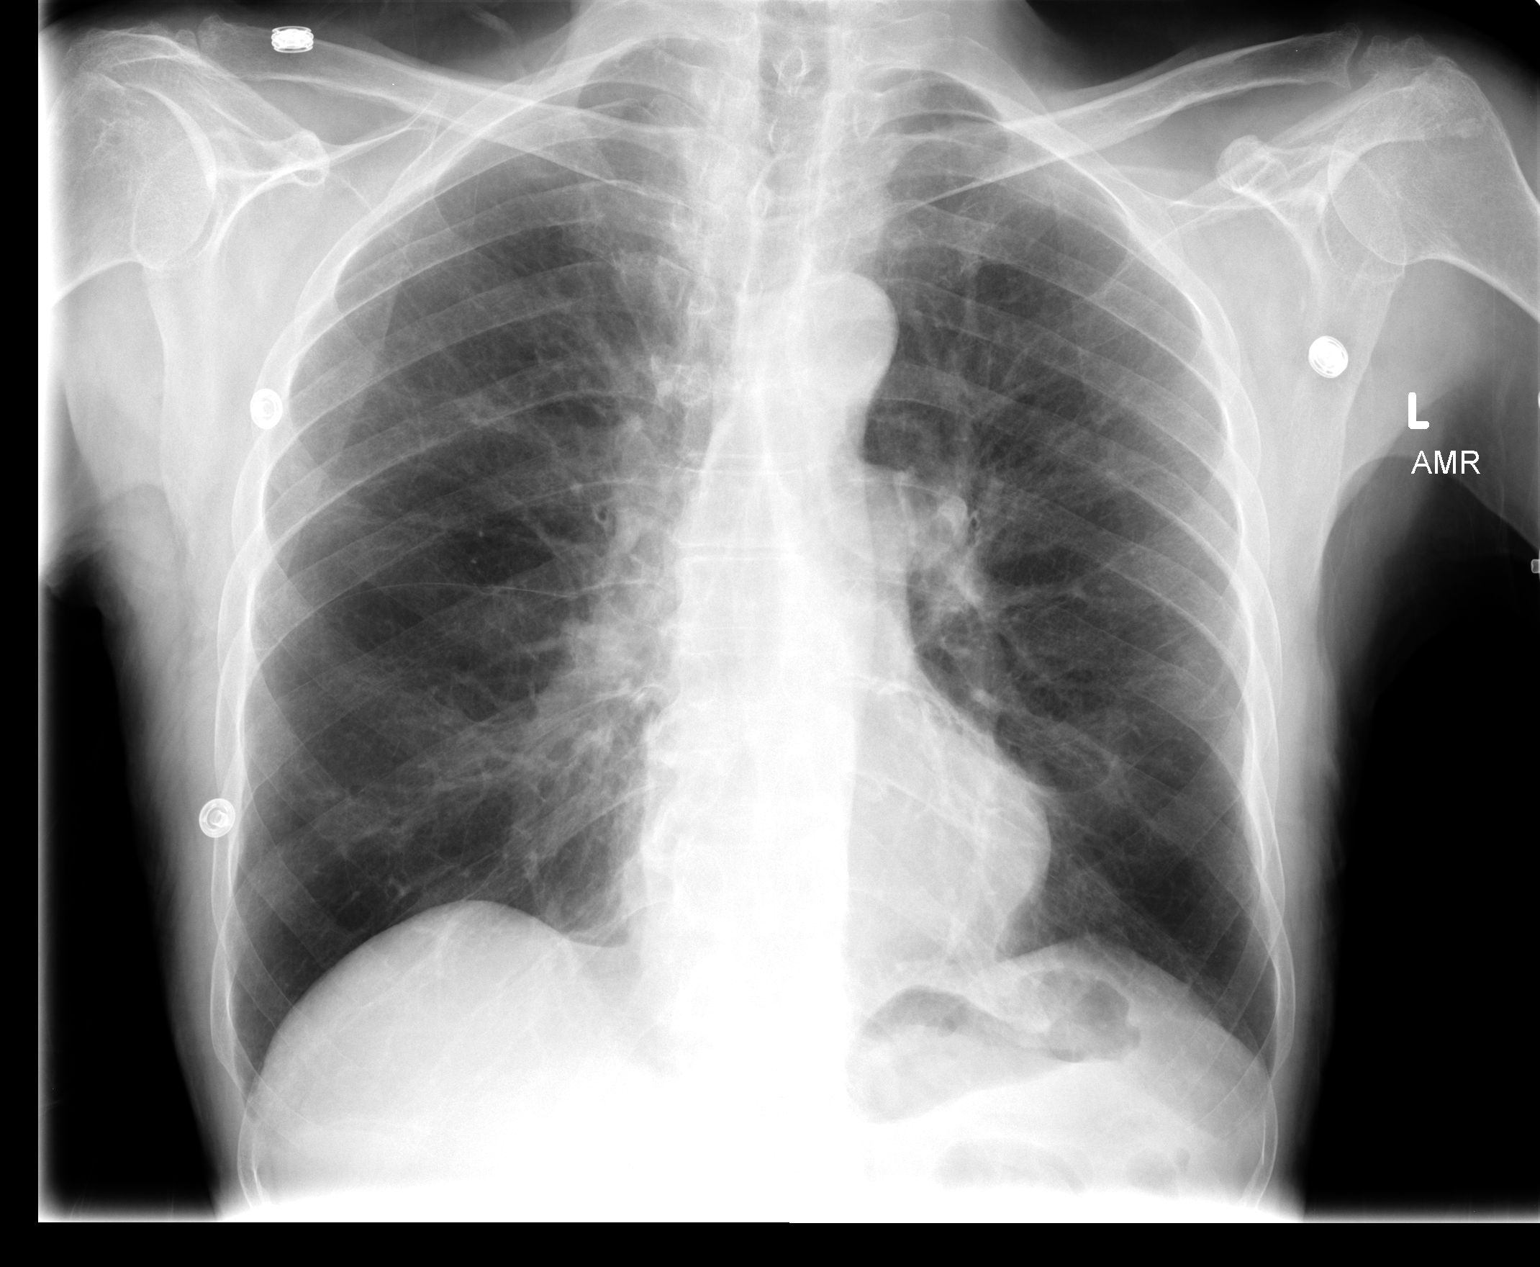

[view not recorded (2 of 2)]
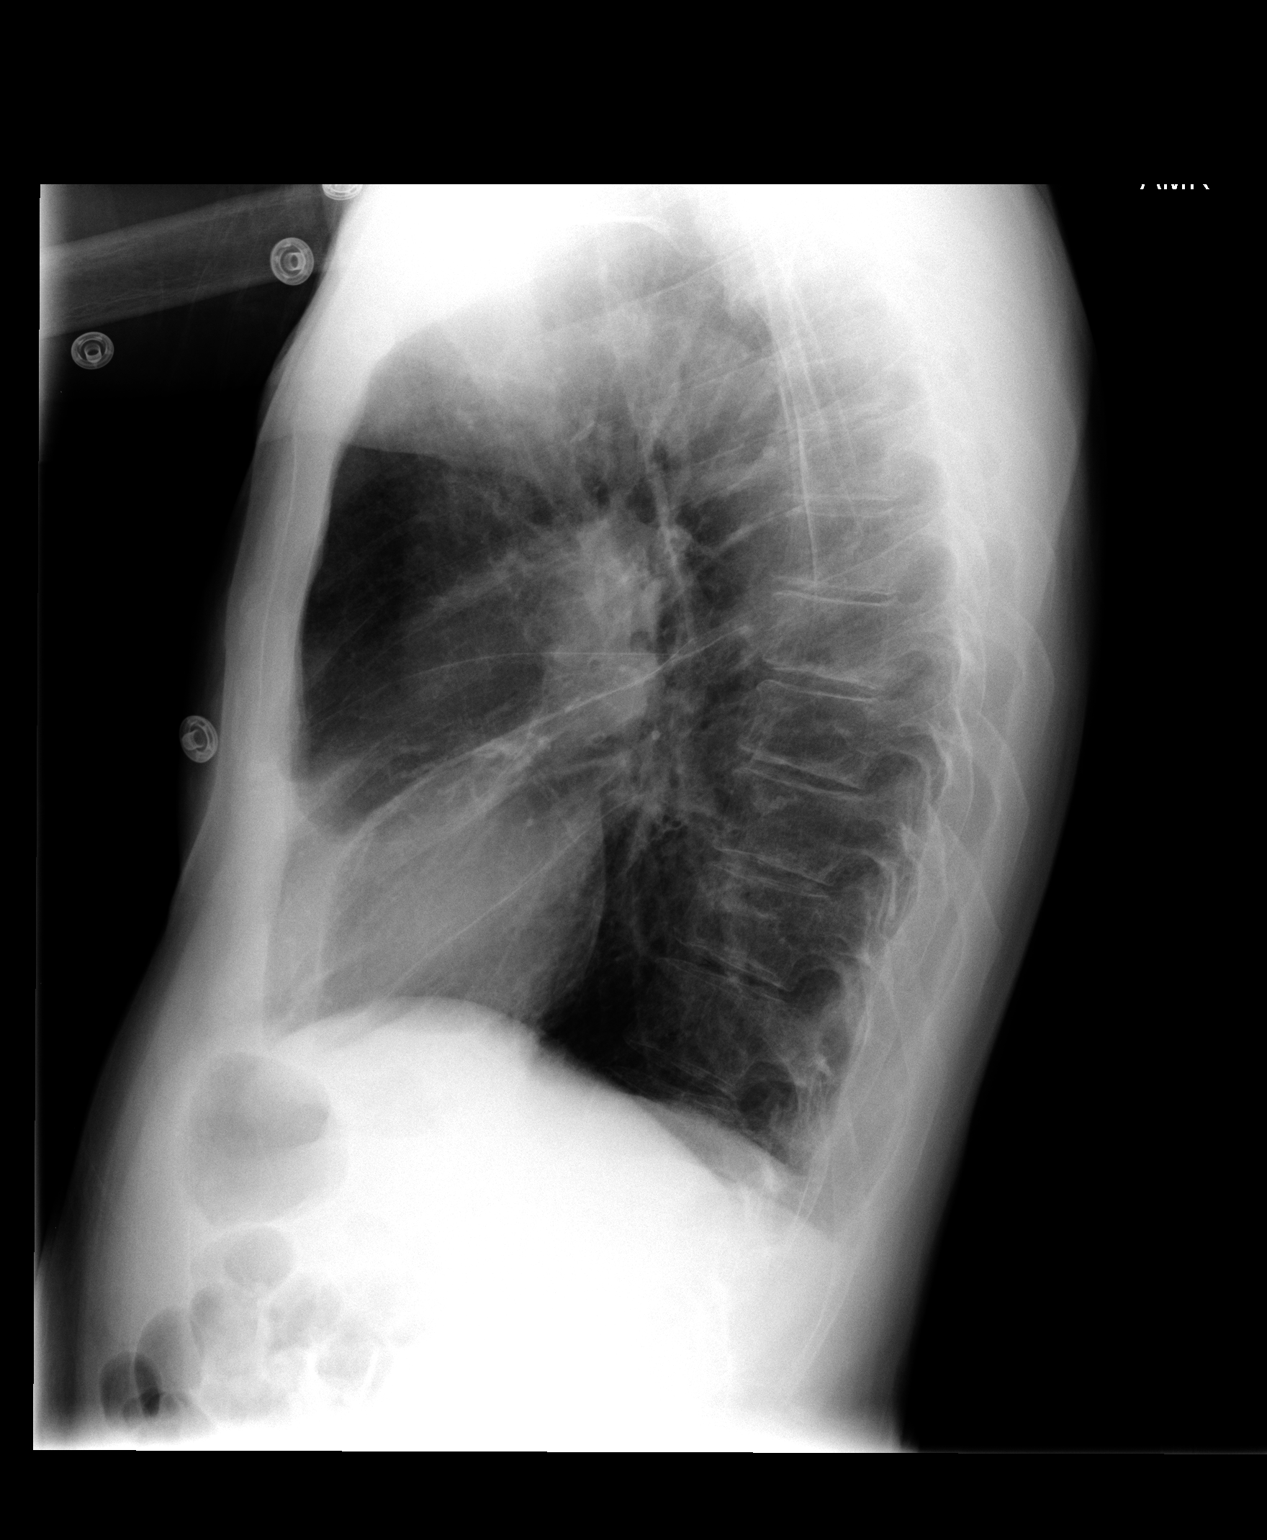

[2 of 2 positions shown; findings below may reference images not displayed]

FINDINGS: The cardiac silhouette, mediastinal and hilar contours
are within normal limits and stable.  Lungs demonstrate stable
emphysematous changes.  No definite acute overlying pulmonary
process.  No pleural effusion.  The bony thorax is intact.
IMPRESSION: Stable emphysematous changes.  No acute pulmonary findings.

## 2013-04-07 ENCOUNTER — Emergency Department (HOSPITAL_COMMUNITY)
Admission: EM | Admit: 2013-04-07 | Discharge: 2013-04-07 | Disposition: A | Discharge status: Home / Self Care | Payer: PRIVATE HEALTH INSURANCE | Attending: Emergency Medicine | Admitting: Emergency Medicine

## 2013-04-07 ENCOUNTER — Encounter (HOSPITAL_COMMUNITY): Payer: Self-pay | Admitting: *Deleted

## 2013-04-07 DIAGNOSIS — Z862 Personal history of diseases of the blood and blood-forming organs and certain disorders involving the immune mechanism: Secondary | ICD-10-CM | POA: Insufficient documentation

## 2013-04-07 DIAGNOSIS — I10 Essential (primary) hypertension: Secondary | ICD-10-CM | POA: Insufficient documentation

## 2013-04-07 DIAGNOSIS — IMO0002 Reserved for concepts with insufficient information to code with codable children: Secondary | ICD-10-CM | POA: Insufficient documentation

## 2013-04-07 DIAGNOSIS — J449 Chronic obstructive pulmonary disease, unspecified: Secondary | ICD-10-CM

## 2013-04-07 DIAGNOSIS — Z8739 Personal history of other diseases of the musculoskeletal system and connective tissue: Secondary | ICD-10-CM | POA: Insufficient documentation

## 2013-04-07 DIAGNOSIS — Z8719 Personal history of other diseases of the digestive system: Secondary | ICD-10-CM | POA: Insufficient documentation

## 2013-04-07 DIAGNOSIS — Z79899 Other long term (current) drug therapy: Secondary | ICD-10-CM | POA: Insufficient documentation

## 2013-04-07 DIAGNOSIS — Z87891 Personal history of nicotine dependence: Secondary | ICD-10-CM | POA: Insufficient documentation

## 2013-04-07 DIAGNOSIS — F259 Schizoaffective disorder, unspecified: Secondary | ICD-10-CM | POA: Insufficient documentation

## 2013-04-07 DIAGNOSIS — F411 Generalized anxiety disorder: Secondary | ICD-10-CM | POA: Insufficient documentation

## 2013-04-07 DIAGNOSIS — Z8669 Personal history of other diseases of the nervous system and sense organs: Secondary | ICD-10-CM | POA: Insufficient documentation

## 2013-04-07 DIAGNOSIS — J441 Chronic obstructive pulmonary disease with (acute) exacerbation: Secondary | ICD-10-CM | POA: Insufficient documentation

## 2013-04-07 DIAGNOSIS — Z8709 Personal history of other diseases of the respiratory system: Secondary | ICD-10-CM | POA: Insufficient documentation

## 2013-04-07 MED ORDER — ALBUTEROL SULFATE HFA 108 (90 BASE) MCG/ACT IN AERS
2.0000 | INHALATION_SPRAY | RESPIRATORY_TRACT | Status: DC | PRN
  Administered 2013-04-07: 2 via RESPIRATORY_TRACT
  Filled 2013-04-07: qty 6.7

## 2013-04-07 NOTE — ED Notes (Signed)
Dr. Deretha Emory aware of pt in department, orders given for inhaler, advised that he would be able to see pt at triage, pt updated on plan of care.

## 2013-04-07 NOTE — ED Notes (Signed)
Pt c/o sob, states that he ran out of his inhaler last night, denies any pain.

## 2013-04-07 NOTE — ED Provider Notes (Signed)
History     CSN: [id number]  Arrival date & time 04/07/13  1353   First MD Initiated Contact with Patient 04/07/13 1411      Chief Complaint  Patient presents with  . Shortness of Breath    (Consider location/radiation/quality/duration/timing/severity/associated sxs/prior treatment) Patient is a 79 y.o. male presenting with shortness of breath. The history is provided by the patient.  Shortness of Breath Associated symptoms: no abdominal pain, no chest pain, no fever, no headaches and no rash    patient well-known to me. Patient presents with complaint of shortness of breath he is known to have COPD. Also ran out of his inhaler last evening. Patient does not have any pain or any new complaints.  Past Medical History  Diagnosis Date  . COPD (chronic obstructive pulmonary disease)   . Anxiety   . Alcohol abuse   . Anemia   . Generalized headaches   . Hypertension   . 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 Pulmonary nodule/lesion, solitary   . Schizoaffective disorder   . GI bleed     History of recurrent bleeding that dates back as far as 2004 per records,  . DDD (degenerative disc disease), cervical     Past Surgical History  Procedure Laterality Date  . Colonoscopy  Sept 2009    SLF: few small AVMs in ascending colon distal to IC . Ablated via BICAP.   Marland Kitchen Esophagogastroduodenoscopy  Sept 2009    SLF: normal esophagus, small HH, 1-2 AVMs actively oozing in mid stomach, s/p BICAP, path benign   . Esophagogastroduodenoscopy  01/18/2012    Rourk-Erosive reflux esophagitis. Noncritical ring. Antral erosions/ small antral ulcer - appeared innocent  (H pylori serrology negative)  . Colonoscopy  03/09/2012    Procedure: COLONOSCOPY;  Surgeon: West Bali, MD;  Location: [facility];  Service: Endoscopy;  Laterality: N/A;  2:30  . Esophagogastroduodenoscopy  03/09/2012    Procedure: ESOPHAGOGASTRODUODENOSCOPY (EGD);   Surgeon: West Bali, MD;  Location: [facility];  Service: Endoscopy;  Laterality: N/A;    Family History  Problem Relation Age of Onset  . Colon cancer Neg Hx     History  Substance Use Topics  . Smoking status: Former Smoker    Types: Cigarettes    Quit date: 05/07/1969  . Smokeless tobacco: Current User    Types: Snuff  . Alcohol Use: 0.0 oz/week     Comment: "everyday"      Review of Systems  Constitutional: Negative for fever.  HENT: Negative for congestion.   Eyes: Negative for redness.  Respiratory: Positive for shortness of breath.   Cardiovascular: Negative for chest pain.  Gastrointestinal: Negative for abdominal pain.  Genitourinary: Negative for hematuria.  Musculoskeletal: Negative for back pain.  Skin: Negative for rash.  Neurological: Negative for headaches.  Hematological: Does not bruise/bleed easily.  Psychiatric/Behavioral: Negative for confusion.    Allergies  Black pepper  Home Medications   Current Outpatient Rx  Name  Route  Sig  Dispense  Refill  . albuterol (PROVENTIL HFA;VENTOLIN HFA) 108 (90 BASE) MCG/ACT inhaler   Inhalation   Inhale 2 puffs into the lungs every 6 (six) hours as needed for wheezing or shortness of breath.         Marland Kitchen albuterol (PROVENTIL) (2.5 MG/3ML) 0.083% nebulizer solution   Nebulization   Take 2.5 mg by nebulization every 6 (six) hours as needed  for wheezing or shortness of breath.         . diazepam (VALIUM) 5 MG tablet   Oral   Take 5 mg by mouth 3 (three) times daily.         Marland Kitchen HYDROcodone-acetaminophen (NORCO/VICODIN) 5-325 MG per tablet   Oral   Take 1 tablet by mouth 4 (four) times daily.         Marland Kitchen loratadine (CLARITIN) 10 MG tablet   Oral   Take 1 tablet (10 mg total) by mouth daily. One po daily x 5 days   5 tablet   0   . methocarbamol (ROBAXIN) 500 MG tablet   Oral   Take 1 tablet (500 mg total) by mouth 4 (four) times daily as needed (muscle spasm).   12 tablet   0   .  predniSONE (DELTASONE) 20 MG tablet   Oral   Take 2 tablets (40 mg total) by mouth daily.   10 tablet   0     BP 140/89  Pulse 93  Temp(Src) 97.8 F (36.6 C) (Oral)  Resp 20  SpO2 97%  Physical Exam  Nursing note and vitals reviewed. Constitutional: He is oriented to person, place, and time. He appears well-developed and well-nourished. No distress.  HENT:  Head: Normocephalic and atraumatic.  Mouth/Throat: Oropharynx is clear and moist.  Eyes: Conjunctivae are normal. Pupils are equal, round, and reactive to light.  Neck: Normal range of motion.  Cardiovascular: Normal rate, regular rhythm and normal heart sounds.   No murmur heard. Pulmonary/Chest: Effort normal and breath sounds normal. No respiratory distress. He has no wheezes. He has no rales.  Abdominal: Soft. Bowel sounds are normal. There is no tenderness.  Musculoskeletal: Normal range of motion.  Neurological: He is alert and oriented to person, place, and time. No cranial nerve deficit. He exhibits normal muscle tone. Coordination normal.  Skin: Skin is warm. No rash noted.    ED Course  Procedures (including critical care time)  Labs Reviewed - No data to display No results found.   1. COPD (chronic obstructive pulmonary disease)       MDM  Patient well-known to [place] frequent visits is been away for about a month.  Patient returns today with complaint of shortness of breath. He is out of his albuterol inhaler. Patient's oxygen saturation 97% on examination no wheezing. No acute distress. Patient provided with albuterol inhaler he'll followup with his primary care Dr. Dr. Juanetta Gosling. No acute distress nontoxic today.        Shelda Jakes, MD 04/07/13 1435

## 2013-04-07 NOTE — ED Notes (Signed)
Pt did not wait to sign for discharge instructions

## 2013-04-11 ENCOUNTER — Emergency Department (HOSPITAL_COMMUNITY): Payer: PRIVATE HEALTH INSURANCE

## 2013-04-11 ENCOUNTER — Emergency Department (HOSPITAL_COMMUNITY)
Admission: EM | Admit: 2013-04-11 | Discharge: 2013-04-11 | Discharge status: Against Medical Advice | Payer: PRIVATE HEALTH INSURANCE | Attending: Emergency Medicine | Admitting: Emergency Medicine

## 2013-04-11 ENCOUNTER — Encounter (HOSPITAL_COMMUNITY): Payer: Self-pay | Admitting: Emergency Medicine

## 2013-04-11 DIAGNOSIS — Q2733 Arteriovenous malformation of digestive system vessel: Secondary | ICD-10-CM | POA: Insufficient documentation

## 2013-04-11 DIAGNOSIS — J441 Chronic obstructive pulmonary disease with (acute) exacerbation: Secondary | ICD-10-CM | POA: Insufficient documentation

## 2013-04-11 DIAGNOSIS — Z8659 Personal history of other mental and behavioral disorders: Secondary | ICD-10-CM | POA: Insufficient documentation

## 2013-04-11 DIAGNOSIS — Z862 Personal history of diseases of the blood and blood-forming organs and certain disorders involving the immune mechanism: Secondary | ICD-10-CM | POA: Insufficient documentation

## 2013-04-11 DIAGNOSIS — Z8669 Personal history of other diseases of the nervous system and sense organs: Secondary | ICD-10-CM | POA: Insufficient documentation

## 2013-04-11 DIAGNOSIS — IMO0002 Reserved for concepts with insufficient information to code with codable children: Secondary | ICD-10-CM | POA: Insufficient documentation

## 2013-04-11 DIAGNOSIS — I10 Essential (primary) hypertension: Secondary | ICD-10-CM | POA: Insufficient documentation

## 2013-04-11 DIAGNOSIS — Z79899 Other long term (current) drug therapy: Secondary | ICD-10-CM | POA: Insufficient documentation

## 2013-04-11 DIAGNOSIS — Z87891 Personal history of nicotine dependence: Secondary | ICD-10-CM | POA: Insufficient documentation

## 2013-04-11 DIAGNOSIS — J449 Chronic obstructive pulmonary disease, unspecified: Secondary | ICD-10-CM

## 2013-04-11 DIAGNOSIS — Z8719 Personal history of other diseases of the digestive system: Secondary | ICD-10-CM | POA: Insufficient documentation

## 2013-04-11 DIAGNOSIS — Z8679 Personal history of other diseases of the circulatory system: Secondary | ICD-10-CM | POA: Insufficient documentation

## 2013-04-11 MED ORDER — ALBUTEROL SULFATE HFA 108 (90 BASE) MCG/ACT IN AERS
2.0000 | INHALATION_SPRAY | Freq: Once | RESPIRATORY_TRACT | Status: AC
  Administered 2013-04-11: 2 via RESPIRATORY_TRACT
  Filled 2013-04-11: qty 6.7

## 2013-04-11 NOTE — ED Notes (Signed)
Patient to desk stating he is leaving. Attempted to get patient to stay for further testing that MD wanted to perform. Refused stating "There's not a need to test anything, all I need is my puffer". Increasing becoming agitated. Signed out AMA. MD aware.

## 2013-04-11 NOTE — ED Provider Notes (Signed)
History    This chart was scribed for Glynn Octave, MD by Leone Payor, ED Scribe. This patient was seen in room [facility] and the patient's care was started 9:32 AM.   CSN: [id number]  Arrival date & time 04/11/13  0920   First MD Initiated Contact with Patient 04/11/13 0932      Chief Complaint  Patient presents with  . Shortness of Breath     The history is provided by the patient. No language interpreter was used.    HPI Comments: [name] is a 79 y.o. male with a h/o COPD who presents to the Emergency Department complaining of new episode of constant, unchanged SOB that started this morning. States his inhaler ran out last night. He denies fever, cough, chest pain, abdominal pain, bloody stools. He is eating and drinking normally. He denies any other pain at this time. Pt is a frequent visitor of the ED.    Past Medical History  Diagnosis Date  . COPD (chronic obstructive pulmonary disease)   . Anxiety   . Alcohol abuse   . Anemia   . Generalized headaches   . Hypertension   . 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 Pulmonary nodule/lesion, solitary   . Schizoaffective disorder   . GI bleed     History of recurrent bleeding that dates back as far as 2004 per records,  . DDD (degenerative disc disease), cervical     Past Surgical History  Procedure Laterality Date  . Colonoscopy  Sept 2009    SLF: few small AVMs in ascending colon distal to IC . Ablated via BICAP.   Marland Kitchen Esophagogastroduodenoscopy  Sept 2009    SLF: normal esophagus, small HH, 1-2 AVMs actively oozing in mid stomach, s/p BICAP, path benign   . Esophagogastroduodenoscopy  01/18/2012    Rourk-Erosive reflux esophagitis. Noncritical ring. Antral erosions/ small antral ulcer - appeared innocent  (H pylori serrology negative)  . Colonoscopy  03/09/2012    Procedure: COLONOSCOPY;  Surgeon: West Bali, MD;  Location: [facility];  Service:  Endoscopy;  Laterality: N/A;  2:30  . Esophagogastroduodenoscopy  03/09/2012    Procedure: ESOPHAGOGASTRODUODENOSCOPY (EGD);  Surgeon: West Bali, MD;  Location: [facility];  Service: Endoscopy;  Laterality: N/A;    Family History  Problem Relation Age of Onset  . Colon cancer Neg Hx     History  Substance Use Topics  . Smoking status: Former Smoker    Types: Cigarettes    Quit date: 05/07/1969  . Smokeless tobacco: Current User    Types: Snuff  . Alcohol Use: 0.0 oz/week     Comment: "everyday"      Review of Systems A complete 10 system review of systems was obtained and all systems are negative except as noted in the HPI and PMH.   Allergies  Black pepper  Home Medications   Current Outpatient Rx  Name  Route  Sig  Dispense  Refill  . loratadine (CLARITIN) 10 MG tablet   Oral   Take 1 tablet (10 mg total) by mouth daily. One po daily x 5 days   5 tablet   0   . albuterol (PROVENTIL HFA;VENTOLIN HFA) 108 (90 BASE) MCG/ACT inhaler   Inhalation   Inhale 2 puffs into the lungs every 6 (six) hours as needed for wheezing or shortness of breath.         Marland Kitchen  albuterol (PROVENTIL) (2.5 MG/3ML) 0.083% nebulizer solution   Nebulization   Take 2.5 mg by nebulization every 6 (six) hours as needed for wheezing or shortness of breath.         . methocarbamol (ROBAXIN) 500 MG tablet   Oral   Take 1 tablet (500 mg total) by mouth 4 (four) times daily as needed (muscle spasm).   12 tablet   0   . predniSONE (DELTASONE) 20 MG tablet   Oral   Take 2 tablets (40 mg total) by mouth daily.   10 tablet   0     BP 138/96  Pulse 78  Temp(Src) 98.7 F (37.1 C) (Oral)  Resp 24  SpO2 93%  Physical Exam  Nursing note and vitals reviewed. Constitutional: He is oriented to person, place, and time. He appears well-developed and well-nourished. No distress.  No distress. Appears to be at baseline.   HENT:  Head: Normocephalic and atraumatic.  Eyes: EOM are normal.   Conjunctiva pink.    Neck: Neck supple. No tracheal deviation present.  Cardiovascular: Normal rate, regular rhythm and normal heart sounds.   Pulmonary/Chest: Effort normal and breath sounds normal. No respiratory distress. He has no wheezes. He has no rales. He exhibits no tenderness.  No increased work to breathe.   Musculoskeletal: Normal range of motion. He exhibits no edema.  Neurological: He is alert and oriented to person, place, and time.  Skin: Skin is warm and dry.  Psychiatric: He has a normal mood and affect. His behavior is normal.    ED Course  Procedures (including critical care time)  DIAGNOSTIC STUDIES: Oxygen Saturation is 93% on room air, adequate by my interpretation.    COORDINATION OF CARE: 9:39 AM Discussed treatment plan with pt at bedside and pt agreed to plan.   Labs Reviewed - No data to display Dg Chest 2 View  04/11/2013   *RADIOLOGY REPORT*  Clinical Data: Short of breath.  CHEST - 2 VIEW  Comparison: 02/19/2013.Multiple other priors.  Findings: Emphysema and pulmonary parenchymal scarring appears similar to the prior exam.  No airspace disease or effusion. Calcified granuloma at the right apex appears similar.  The cardiopericardial silhouette and mediastinal contours are unchanged.  Lingular and right middle lobe scarring evident on the lateral view.  IMPRESSION: No acute cardiopulmonary disease with or interval change. Emphysema.   Original Report Authenticated By: Andreas Newport, M.D.     1. COPD (chronic obstructive pulmonary disease)       MDM  Difficulty breathing since last night. States out of inhaler. Patient well known to the ED with frequent visits for same.  No distress, no hypoxia, no wheezing on exam. Appears to be at his baseline.  He is given albuterol inhaler. He then walked out of the ED before receiving any discharge instructions.   I personally performed the services described in this documentation, which was scribed in my  presence. The recorded information has been reviewed and is accurate.     Glynn Octave, MD 04/11/13 [phone]

## 2013-04-11 NOTE — ED Notes (Signed)
Pt c/o sob this am. Denies cough. Nad.  No resp distress noted. Denies pain. Slight sob noted.

## 2013-04-11 NOTE — ED Notes (Signed)
Pt refused EKG/ said he only here for an inhaler

## 2013-05-11 ENCOUNTER — Emergency Department (HOSPITAL_COMMUNITY)
Admission: EM | Admit: 2013-05-11 | Discharge: 2013-05-12 | Disposition: A | Discharge status: Home / Self Care | Payer: PRIVATE HEALTH INSURANCE | Attending: Emergency Medicine | Admitting: Emergency Medicine

## 2013-05-11 ENCOUNTER — Encounter (HOSPITAL_COMMUNITY): Payer: Self-pay | Admitting: *Deleted

## 2013-05-11 DIAGNOSIS — Z79899 Other long term (current) drug therapy: Secondary | ICD-10-CM | POA: Insufficient documentation

## 2013-05-11 DIAGNOSIS — Z8709 Personal history of other diseases of the respiratory system: Secondary | ICD-10-CM | POA: Insufficient documentation

## 2013-05-11 DIAGNOSIS — M5030 Other cervical disc degeneration, unspecified cervical region: Secondary | ICD-10-CM | POA: Insufficient documentation

## 2013-05-11 DIAGNOSIS — R0602 Shortness of breath: Secondary | ICD-10-CM

## 2013-05-11 DIAGNOSIS — I10 Essential (primary) hypertension: Secondary | ICD-10-CM | POA: Insufficient documentation

## 2013-05-11 DIAGNOSIS — J441 Chronic obstructive pulmonary disease with (acute) exacerbation: Secondary | ICD-10-CM | POA: Insufficient documentation

## 2013-05-11 DIAGNOSIS — Z862 Personal history of diseases of the blood and blood-forming organs and certain disorders involving the immune mechanism: Secondary | ICD-10-CM | POA: Insufficient documentation

## 2013-05-11 DIAGNOSIS — Z87891 Personal history of nicotine dependence: Secondary | ICD-10-CM | POA: Insufficient documentation

## 2013-05-11 DIAGNOSIS — J449 Chronic obstructive pulmonary disease, unspecified: Secondary | ICD-10-CM

## 2013-05-11 DIAGNOSIS — Z8659 Personal history of other mental and behavioral disorders: Secondary | ICD-10-CM | POA: Insufficient documentation

## 2013-05-11 DIAGNOSIS — Z8679 Personal history of other diseases of the circulatory system: Secondary | ICD-10-CM | POA: Insufficient documentation

## 2013-05-11 DIAGNOSIS — Z8719 Personal history of other diseases of the digestive system: Secondary | ICD-10-CM | POA: Insufficient documentation

## 2013-05-11 DIAGNOSIS — Z8774 Personal history of (corrected) congenital malformations of heart and circulatory system: Secondary | ICD-10-CM | POA: Insufficient documentation

## 2013-05-11 MED ORDER — IPRATROPIUM BROMIDE 0.02 % IN SOLN
0.5000 mg | Freq: Once | RESPIRATORY_TRACT | Status: AC
  Administered 2013-05-12: 0.5 mg via RESPIRATORY_TRACT
  Filled 2013-05-11: qty 2.5

## 2013-05-11 MED ORDER — ALBUTEROL SULFATE (5 MG/ML) 0.5% IN NEBU
2.5000 mg | INHALATION_SOLUTION | Freq: Once | RESPIRATORY_TRACT | Status: AC
  Administered 2013-05-12: 2.5 mg via RESPIRATORY_TRACT
  Filled 2013-05-11: qty 0.5

## 2013-05-11 NOTE — ED Notes (Signed)
Pt c/o sob x 45 mins. Pt states, "I need a breather."

## 2013-05-12 NOTE — ED Provider Notes (Signed)
History    CSN: [id number] Arrival date & time 05/11/13  2233  First MD Initiated Contact with Patient 05/11/13 2336     Chief Complaint  Patient presents with  . Shortness of Breath   (Consider location/radiation/quality/duration/timing/severity/associated sxs/prior Treatment) HPI HPI Comments: [name] is a 79 y.o. male with a h/o COPD here with shortness of breath for 45 minutes. He did not have his inhaler with him. Denies fever, chills, nausea, vomiting.   PCP Dr. Juanetta Gosling  Past Medical History  Diagnosis Date  . COPD (chronic obstructive pulmonary disease)   . Anxiety   . Alcohol abuse   . Anemia   . Generalized headaches   . Hypertension   . 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 Pulmonary nodule/lesion, solitary   . Schizoaffective disorder   . GI bleed     History of recurrent bleeding that dates back as far as 2004 per records,  . DDD (degenerative disc disease), cervical    Past Surgical History  Procedure Laterality Date  . Colonoscopy  Sept 2009    SLF: few small AVMs in ascending colon distal to IC . Ablated via BICAP.   Marland Kitchen Esophagogastroduodenoscopy  Sept 2009    SLF: normal esophagus, small HH, 1-2 AVMs actively oozing in mid stomach, s/p BICAP, path benign   . Esophagogastroduodenoscopy  01/18/2012    Rourk-Erosive reflux esophagitis. Noncritical ring. Antral erosions/ small antral ulcer - appeared innocent  (H pylori serrology negative)  . Colonoscopy  03/09/2012    Procedure: COLONOSCOPY;  Surgeon: West Bali, MD;  Location: [facility];  Service: Endoscopy;  Laterality: N/A;  2:30  . Esophagogastroduodenoscopy  03/09/2012    Procedure: ESOPHAGOGASTRODUODENOSCOPY (EGD);  Surgeon: West Bali, MD;  Location: [facility];  Service: Endoscopy;  Laterality: N/A;   Family History  Problem Relation Age of Onset  . Colon cancer Neg Hx    History  Substance Use Topics  . Smoking  status: Former Smoker    Types: Cigarettes    Quit date: 05/07/1969  . Smokeless tobacco: Current User    Types: Snuff  . Alcohol Use: 0.0 oz/week     Comment: "everyday"    Review of Systems  Constitutional: Negative for fever.       10 Systems reviewed and are negative for acute change except as noted in the HPI.  HENT: Negative for congestion.   Eyes: Negative for discharge and redness.  Respiratory: Positive for shortness of breath. Negative for cough.   Cardiovascular: Negative for chest pain.  Gastrointestinal: Negative for vomiting and abdominal pain.  Musculoskeletal: Negative for back pain.  Skin: Negative for rash.  Neurological: Negative for syncope, numbness and headaches.  Psychiatric/Behavioral:       No behavior change.    Allergies  Black pepper  Home Medications   Current Outpatient Rx  Name  Route  Sig  Dispense  Refill  . albuterol (PROVENTIL HFA;VENTOLIN HFA) 108 (90 BASE) MCG/ACT inhaler   Inhalation   Inhale 2 puffs into the lungs every 6 (six) hours as needed for wheezing or shortness of breath.         Marland Kitchen albuterol (PROVENTIL) (2.5 MG/3ML) 0.083% nebulizer solution   Nebulization   Take 2.5 mg by nebulization every 6 (six) hours as needed for wheezing or shortness of breath.         . loratadine (CLARITIN) 10 MG  tablet   Oral   Take 1 tablet (10 mg total) by mouth daily. One po daily x 5 days   5 tablet   0   . methocarbamol (ROBAXIN) 500 MG tablet   Oral   Take 1 tablet (500 mg total) by mouth 4 (four) times daily as needed (muscle spasm).   12 tablet   0   . predniSONE (DELTASONE) 20 MG tablet   Oral   Take 2 tablets (40 mg total) by mouth daily.   10 tablet   0    BP 133/98  Pulse 105  Temp(Src) 97.8 F (36.6 C) (Oral)  Resp 22  Ht 5\' 3"  (1.6 m)  Wt 104 lb 4 oz (47.287 kg)  BMI 18.47 kg/m2  SpO2 91% Physical Exam  Nursing note and vitals reviewed. Constitutional: He appears well-developed and well-nourished.  Awake,  alert, nontoxic appearance.  HENT:  Head: Normocephalic and atraumatic.  Eyes: EOM are normal. Pupils are equal, round, and reactive to light.  Neck: Normal range of motion. Neck supple.  Cardiovascular: Normal rate and intact distal pulses.   Pulmonary/Chest: Effort normal. He exhibits no tenderness.  Increased use of accessory muscles. Able talk in full sentences.   Abdominal: Soft. There is no tenderness. There is no rebound.  Musculoskeletal: He exhibits no tenderness.  Baseline ROM, no obvious new focal weakness.  Neurological:  Mental status and motor strength appears baseline for patient and situation.  Skin: No rash noted.  Psychiatric: He has a normal mood and affect.    ED Course  Procedures (including critical care time) Labs Reviewed - No data to display No results found. 1. Shortness of breath   2. COPD (chronic obstructive pulmonary disease)     MDM  Patient well known to the ER for COPD with shortness of breath. He is poorly compliant with his medications. Given albuterol/atrovent with improvement. He will be discharged home with instructions to follow up with Dr. Juanetta Gosling. Pt stable in ED with no significant deterioration in condition.The patient appears reasonably screened and/or stabilized for discharge and I doubt any other medical condition or other [facility] requiring further screening, evaluation, or treatment in the ED at this time prior to discharge.  MDM Reviewed: nursing note and vitals        Nicoletta Dress. Colon Branch, MD 05/12/13 1610

## 2013-05-25 DIAGNOSIS — R109 Unspecified abdominal pain: Secondary | ICD-10-CM

## 2013-06-23 ENCOUNTER — Encounter (HOSPITAL_COMMUNITY): Payer: Self-pay

## 2013-06-23 ENCOUNTER — Emergency Department (HOSPITAL_COMMUNITY)
Admission: EM | Admit: 2013-06-23 | Discharge: 2013-06-23 | Disposition: A | Discharge status: Home / Self Care | Payer: PRIVATE HEALTH INSURANCE | Attending: Emergency Medicine | Admitting: Emergency Medicine

## 2013-06-23 DIAGNOSIS — Q2733 Arteriovenous malformation of digestive system vessel: Secondary | ICD-10-CM | POA: Insufficient documentation

## 2013-06-23 DIAGNOSIS — Z760 Encounter for issue of repeat prescription: Secondary | ICD-10-CM | POA: Insufficient documentation

## 2013-06-23 DIAGNOSIS — Z8719 Personal history of other diseases of the digestive system: Secondary | ICD-10-CM | POA: Insufficient documentation

## 2013-06-23 DIAGNOSIS — F1021 Alcohol dependence, in remission: Secondary | ICD-10-CM | POA: Insufficient documentation

## 2013-06-23 DIAGNOSIS — Z862 Personal history of diseases of the blood and blood-forming organs and certain disorders involving the immune mechanism: Secondary | ICD-10-CM | POA: Insufficient documentation

## 2013-06-23 DIAGNOSIS — J441 Chronic obstructive pulmonary disease with (acute) exacerbation: Secondary | ICD-10-CM | POA: Insufficient documentation

## 2013-06-23 DIAGNOSIS — Z8739 Personal history of other diseases of the musculoskeletal system and connective tissue: Secondary | ICD-10-CM | POA: Insufficient documentation

## 2013-06-23 DIAGNOSIS — I10 Essential (primary) hypertension: Secondary | ICD-10-CM | POA: Insufficient documentation

## 2013-06-23 DIAGNOSIS — J449 Chronic obstructive pulmonary disease, unspecified: Secondary | ICD-10-CM

## 2013-06-23 DIAGNOSIS — Z87891 Personal history of nicotine dependence: Secondary | ICD-10-CM | POA: Insufficient documentation

## 2013-06-23 DIAGNOSIS — Z8659 Personal history of other mental and behavioral disorders: Secondary | ICD-10-CM | POA: Insufficient documentation

## 2013-06-23 DIAGNOSIS — IMO0002 Reserved for concepts with insufficient information to code with codable children: Secondary | ICD-10-CM | POA: Insufficient documentation

## 2013-06-23 MED ORDER — ALBUTEROL SULFATE HFA 108 (90 BASE) MCG/ACT IN AERS: 1.0000 | INHALATION_SPRAY | Freq: Four times a day (QID) | RESPIRATORY_TRACT | Status: DC | PRN

## 2013-06-23 MED ORDER — ALBUTEROL SULFATE HFA 108 (90 BASE) MCG/ACT IN AERS: 2.0000 | INHALATION_SPRAY | RESPIRATORY_TRACT | Status: DC | PRN
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
## 2013-06-23 NOTE — ED Notes (Signed)
Pt ran out of his proair last night and went to his pharmacy today and they would not refill it, also ran out of his neb treatments.  Drank a beer thinking it would help his breathing and it did not help. Pt stated his is sob but just wants his inhaler refilled.
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
## 2013-06-23 NOTE — ED Notes (Signed)
Pt waited outside for discharge papers, was given instructions outside and stated he was walking to [facility] to get his script filled.

## 2013-07-20 ENCOUNTER — Encounter (HOSPITAL_COMMUNITY): Payer: Self-pay | Admitting: Emergency Medicine

## 2013-07-20 ENCOUNTER — Emergency Department (HOSPITAL_COMMUNITY)
Admission: EM | Admit: 2013-07-20 | Discharge: 2013-07-20 | Disposition: A | Discharge status: Home / Self Care | Payer: PRIVATE HEALTH INSURANCE | Attending: Emergency Medicine | Admitting: Emergency Medicine

## 2013-07-20 DIAGNOSIS — Z8719 Personal history of other diseases of the digestive system: Secondary | ICD-10-CM | POA: Insufficient documentation

## 2013-07-20 DIAGNOSIS — Z87891 Personal history of nicotine dependence: Secondary | ICD-10-CM | POA: Insufficient documentation

## 2013-07-20 DIAGNOSIS — Z8739 Personal history of other diseases of the musculoskeletal system and connective tissue: Secondary | ICD-10-CM | POA: Insufficient documentation

## 2013-07-20 DIAGNOSIS — Z862 Personal history of diseases of the blood and blood-forming organs and certain disorders involving the immune mechanism: Secondary | ICD-10-CM | POA: Insufficient documentation

## 2013-07-20 DIAGNOSIS — Z8659 Personal history of other mental and behavioral disorders: Secondary | ICD-10-CM | POA: Insufficient documentation

## 2013-07-20 DIAGNOSIS — IMO0002 Reserved for concepts with insufficient information to code with codable children: Secondary | ICD-10-CM | POA: Insufficient documentation

## 2013-07-20 DIAGNOSIS — I10 Essential (primary) hypertension: Secondary | ICD-10-CM | POA: Insufficient documentation

## 2013-07-20 DIAGNOSIS — J449 Chronic obstructive pulmonary disease, unspecified: Secondary | ICD-10-CM

## 2013-07-20 DIAGNOSIS — Z79899 Other long term (current) drug therapy: Secondary | ICD-10-CM | POA: Insufficient documentation

## 2013-07-20 DIAGNOSIS — J441 Chronic obstructive pulmonary disease with (acute) exacerbation: Secondary | ICD-10-CM | POA: Insufficient documentation

## 2013-07-20 MED ORDER — IPRATROPIUM BROMIDE 0.02 % IN SOLN
0.5000 mg | Freq: Once | RESPIRATORY_TRACT | Status: AC
  Administered 2013-07-20: 0.5 mg via RESPIRATORY_TRACT
  Filled 2013-07-20: qty 2.5

## 2013-07-20 MED ORDER — ALBUTEROL SULFATE (5 MG/ML) 0.5% IN NEBU
5.0000 mg | INHALATION_SOLUTION | Freq: Once | RESPIRATORY_TRACT | Status: AC
Start: 2013-07-20 — End: 2013-07-20
  Administered 2013-07-20: 5 mg via RESPIRATORY_TRACT
  Filled 2013-07-20: qty 1

## 2013-07-20 MED ORDER — ALBUTEROL SULFATE HFA 108 (90 BASE) MCG/ACT IN AERS
2.0000 | INHALATION_SPRAY | RESPIRATORY_TRACT | Status: DC | PRN
  Administered 2013-07-20: 2 via RESPIRATORY_TRACT
  Filled 2013-07-20: qty 6.7

## 2013-07-20 NOTE — ED Notes (Signed)
Pt returns to the ED with C/O SOB. Pt ambulated back to hallway bed 6 from triage without difficulty. No respiratory distress noted. Pt sitting on stretcher patiently awaiting RTT for treatment.

## 2013-07-20 NOTE — ED Notes (Signed)
Patient reports, "I don't want to be seen, I just need a breather."

## 2013-07-20 NOTE — ED Provider Notes (Signed)
CSN: [id number]     Arrival date & time 07/20/13  1947 History   First MD Initiated Contact with Patient 07/20/13 2007     Chief Complaint  Patient presents with  . Shortness of Breath   (Consider location/radiation/quality/duration/timing/severity/associated sxs/prior Treatment) Patient is a 79 y.o. male presenting with shortness of breath. The history is provided by the patient (the pt complains of wheezing).  Shortness of Breath Severity:  Moderate Onset quality:  Sudden Timing:  Constant Progression:  Waxing and waning Chronicity:  Chronic Context: activity   Associated symptoms: no abdominal pain, no chest pain, no cough, no headaches and no rash     Past Medical History  Diagnosis Date  . COPD (chronic obstructive pulmonary disease)   . Anxiety   . Alcohol abuse   . Anemia   . Generalized headaches   . Hypertension   . 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 Pulmonary nodule/lesion, solitary   . Schizoaffective disorder   . GI bleed     History of recurrent bleeding that dates back as far as 2004 per records,  . DDD (degenerative disc disease), cervical    Past Surgical History  Procedure Laterality Date  . Colonoscopy  Sept 2009    SLF: few small AVMs in ascending colon distal to IC . Ablated via BICAP.   Marland Kitchen Esophagogastroduodenoscopy  Sept 2009    SLF: normal esophagus, small HH, 1-2 AVMs actively oozing in mid stomach, s/p BICAP, path benign   . Esophagogastroduodenoscopy  01/18/2012    Rourk-Erosive reflux esophagitis. Noncritical ring. Antral erosions/ small antral ulcer - appeared innocent  (H pylori serrology negative)  . Colonoscopy  03/09/2012    Procedure: COLONOSCOPY;  Surgeon: West Bali, MD;  Location: [facility];  Service: Endoscopy;  Laterality: N/A;  2:30  . Esophagogastroduodenoscopy  03/09/2012    Procedure: ESOPHAGOGASTRODUODENOSCOPY (EGD);  Surgeon: West Bali, MD;  [facility];  Service: Endoscopy;  Laterality: N/A;   Family History  Problem Relation Age of Onset  . Colon cancer Neg Hx    History  Substance Use Topics  . Smoking status: Former Smoker    Types: Cigarettes    Quit date: 05/07/1969  . Smokeless tobacco: Current User    Types: Snuff  . Alcohol Use: 0.0 oz/week     Comment: "everyday"    Review of Systems  Constitutional: Negative for appetite change and fatigue.  HENT: Negative for congestion, sinus pressure and ear discharge.   Eyes: Negative for discharge.  Respiratory: Positive for shortness of breath. Negative for cough.   Cardiovascular: Negative for chest pain.  Gastrointestinal: Negative for abdominal pain and diarrhea.  Genitourinary: Negative for frequency and hematuria.  Musculoskeletal: Negative for back pain.  Skin: Negative for rash.  Neurological: Negative for seizures and headaches.  Psychiatric/Behavioral: Negative for hallucinations.    Allergies  Black pepper  Home Medications   Current Outpatient Rx  Name  Route  Sig  Dispense  Refill  . albuterol (PROVENTIL HFA;VENTOLIN HFA) 108 (90 BASE) MCG/ACT inhaler   Inhalation   Inhale 2 puffs into the lungs every 6 (six) hours as needed for wheezing or shortness of breath.         Marland Kitchen albuterol (PROVENTIL HFA;VENTOLIN HFA) 108 (90 BASE) MCG/ACT inhaler   Inhalation   Inhale 1-2 puffs into the lungs every 6 (six) hours as needed for wheezing.  1 Inhaler   0   . albuterol (PROVENTIL) (2.5 MG/3ML) 0.083% nebulizer solution   Nebulization   Take 2.5 mg by nebulization every 6 (six) hours as needed for wheezing or shortness of breath.         . loratadine (CLARITIN) 10 MG tablet   Oral   Take 1 tablet (10 mg total) by mouth daily. One po daily x 5 days   5 tablet   0   . methocarbamol (ROBAXIN) 500 MG tablet   Oral   Take 1 tablet (500 mg total) by mouth 4 (four) times daily as needed (muscle spasm).   12 tablet   0   . predniSONE (DELTASONE) 20 MG  tablet   Oral   Take 2 tablets (40 mg total) by mouth daily.   10 tablet   0    BP 144/63  Pulse 91  Temp(Src) 98 F (36.7 C) (Oral)  Resp 22  Ht 5\' 4"  (1.626 m)  Wt 108 lb 1 oz (49.017 kg)  BMI 18.54 kg/m2  SpO2 92% Physical Exam  Constitutional: He is oriented to person, place, and time. He appears well-developed.  HENT:  Head: Normocephalic.  Eyes: Conjunctivae and EOM are normal. No scleral icterus.  Neck: Neck supple. No thyromegaly present.  Cardiovascular: Normal rate and regular rhythm.  Exam reveals no gallop and no friction rub.   No murmur heard. Pulmonary/Chest: No stridor. He has wheezes. He has no rales. He exhibits no tenderness.  Abdominal: He exhibits no distension. There is no tenderness. There is no rebound.  Musculoskeletal: Normal range of motion. He exhibits no edema.  Lymphadenopathy:    He has no cervical adenopathy.  Neurological: He is oriented to person, place, and time. Coordination normal.  Skin: No rash noted. No erythema.  Psychiatric: He has a normal mood and affect. His behavior is normal.    ED Course  Procedures (including critical care time) Labs Review Labs Reviewed - No data to display Imaging Review No results found.  MDM   1. COPD (chronic obstructive pulmonary disease)        [name], MD 07/20/13 2114

## 2013-08-11 ENCOUNTER — Inpatient Hospital Stay (HOSPITAL_COMMUNITY)
Admission: EM | Admit: 2013-08-11 | Discharge: 2013-08-31 | DRG: 208 | Disposition: E | Payer: PRIVATE HEALTH INSURANCE | Attending: Surgery | Admitting: Surgery

## 2013-08-11 ENCOUNTER — Encounter (HOSPITAL_COMMUNITY): Payer: Self-pay | Admitting: Emergency Medicine

## 2013-08-11 DIAGNOSIS — D500 Iron deficiency anemia secondary to blood loss (chronic): Secondary | ICD-10-CM | POA: Diagnosis present

## 2013-08-11 DIAGNOSIS — S2239XA Fracture of one rib, unspecified side, initial encounter for closed fracture: Secondary | ICD-10-CM | POA: Diagnosis present

## 2013-08-11 DIAGNOSIS — Z681 Body mass index (BMI) 19 or less, adult: Secondary | ICD-10-CM

## 2013-08-11 DIAGNOSIS — I10 Essential (primary) hypertension: Secondary | ICD-10-CM | POA: Diagnosis present

## 2013-08-11 DIAGNOSIS — S270XXA Traumatic pneumothorax, initial encounter: Principal | ICD-10-CM

## 2013-08-11 DIAGNOSIS — G9340 Encephalopathy, unspecified: Secondary | ICD-10-CM

## 2013-08-11 DIAGNOSIS — I214 Non-ST elevation (NSTEMI) myocardial infarction: Secondary | ICD-10-CM

## 2013-08-11 DIAGNOSIS — R079 Chest pain, unspecified: Secondary | ICD-10-CM

## 2013-08-11 DIAGNOSIS — F0390 Unspecified dementia without behavioral disturbance: Secondary | ICD-10-CM | POA: Diagnosis present

## 2013-08-11 DIAGNOSIS — R0902 Hypoxemia: Secondary | ICD-10-CM

## 2013-08-11 DIAGNOSIS — I252 Old myocardial infarction: Secondary | ICD-10-CM

## 2013-08-11 DIAGNOSIS — J441 Chronic obstructive pulmonary disease with (acute) exacerbation: Secondary | ICD-10-CM | POA: Diagnosis present

## 2013-08-11 DIAGNOSIS — F259 Schizoaffective disorder, unspecified: Secondary | ICD-10-CM | POA: Diagnosis present

## 2013-08-11 DIAGNOSIS — J449 Chronic obstructive pulmonary disease, unspecified: Secondary | ICD-10-CM | POA: Diagnosis present

## 2013-08-11 DIAGNOSIS — F411 Generalized anxiety disorder: Secondary | ICD-10-CM | POA: Diagnosis present

## 2013-08-11 DIAGNOSIS — R410 Disorientation, unspecified: Secondary | ICD-10-CM | POA: Diagnosis not present

## 2013-08-11 DIAGNOSIS — Z87891 Personal history of nicotine dependence: Secondary | ICD-10-CM

## 2013-08-11 DIAGNOSIS — S2231XA Fracture of one rib, right side, initial encounter for closed fracture: Secondary | ICD-10-CM

## 2013-08-11 DIAGNOSIS — E43 Unspecified severe protein-calorie malnutrition: Secondary | ICD-10-CM | POA: Diagnosis present

## 2013-08-11 DIAGNOSIS — I469 Cardiac arrest, cause unspecified: Secondary | ICD-10-CM

## 2013-08-11 DIAGNOSIS — F209 Schizophrenia, unspecified: Secondary | ICD-10-CM | POA: Diagnosis present

## 2013-08-11 DIAGNOSIS — J9600 Acute respiratory failure, unspecified whether with hypoxia or hypercapnia: Secondary | ICD-10-CM | POA: Diagnosis not present

## 2013-08-11 DIAGNOSIS — I2789 Other specified pulmonary heart diseases: Secondary | ICD-10-CM | POA: Diagnosis present

## 2013-08-11 DIAGNOSIS — F1010 Alcohol abuse, uncomplicated: Secondary | ICD-10-CM | POA: Diagnosis present

## 2013-08-11 DIAGNOSIS — R4189 Other symptoms and signs involving cognitive functions and awareness: Secondary | ICD-10-CM

## 2013-08-11 DIAGNOSIS — J939 Pneumothorax, unspecified: Secondary | ICD-10-CM | POA: Diagnosis present

## 2013-08-11 DIAGNOSIS — J4489 Other specified chronic obstructive pulmonary disease: Secondary | ICD-10-CM | POA: Diagnosis present

## 2013-08-11 HISTORY — DX: Fracture of one rib, right side, initial encounter for closed fracture: S22.31XA

## 2013-08-11 MED ORDER — ALBUTEROL SULFATE (5 MG/ML) 0.5% IN NEBU
INHALATION_SOLUTION | RESPIRATORY_TRACT | Status: AC
  Filled 2013-08-11: qty 1

## 2013-08-11 MED ORDER — PREDNISONE 50 MG PO TABS
60.0000 mg | ORAL_TABLET | Freq: Once | ORAL | Status: AC
  Administered 2013-08-11: 60 mg via ORAL
  Filled 2013-08-11 (×2): qty 1

## 2013-08-11 MED ORDER — IPRATROPIUM BROMIDE 0.02 % IN SOLN
RESPIRATORY_TRACT | Status: AC
  Filled 2013-08-11: qty 2.5

## 2013-08-11 MED ORDER — ALBUTEROL SULFATE (5 MG/ML) 0.5% IN NEBU
2.5000 mg | INHALATION_SOLUTION | Freq: Once | RESPIRATORY_TRACT | Status: AC
  Administered 2013-08-11: 2.5 mg via RESPIRATORY_TRACT

## 2013-08-11 MED ORDER — IPRATROPIUM BROMIDE 0.02 % IN SOLN
0.5000 mg | Freq: Once | RESPIRATORY_TRACT | Status: AC
  Administered 2013-08-11: 0.5 mg via RESPIRATORY_TRACT
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
## 2013-08-12 ENCOUNTER — Encounter (HOSPITAL_COMMUNITY): Payer: Self-pay | Admitting: General Practice

## 2013-08-12 ENCOUNTER — Emergency Department (HOSPITAL_COMMUNITY): Payer: PRIVATE HEALTH INSURANCE

## 2013-08-12 DIAGNOSIS — S2239XA Fracture of one rib, unspecified side, initial encounter for closed fracture: Secondary | ICD-10-CM

## 2013-08-12 DIAGNOSIS — F09 Unspecified mental disorder due to known physiological condition: Secondary | ICD-10-CM

## 2013-08-12 DIAGNOSIS — J449 Chronic obstructive pulmonary disease, unspecified: Secondary | ICD-10-CM

## 2013-08-12 DIAGNOSIS — S2231XA Fracture of one rib, right side, initial encounter for closed fracture: Secondary | ICD-10-CM

## 2013-08-12 DIAGNOSIS — J939 Pneumothorax, unspecified: Secondary | ICD-10-CM | POA: Diagnosis present

## 2013-08-12 DIAGNOSIS — S270XXA Traumatic pneumothorax, initial encounter: Secondary | ICD-10-CM

## 2013-08-12 LAB — COMPREHENSIVE METABOLIC PANEL
AST: 37 U/L (ref 0–37)
Albumin: 3.8 g/dL (ref 3.5–5.2)
CO2: 24 mEq/L (ref 19–32)
Calcium: 9.9 mg/dL (ref 8.4–10.5)
Chloride: 93 mEq/L — ABNORMAL LOW (ref 96–112)
Creatinine, Ser: 0.81 mg/dL (ref 0.50–1.35)
GFR calc Af Amer: >90 mL/min (ref >90)
GFR calc non Af Amer: 81 mL/min — ABNORMAL LOW (ref >90)
Sodium: 130 mEq/L — ABNORMAL LOW (ref 135–145)
Total Bilirubin: 0.2 mg/dL — ABNORMAL LOW (ref 0.3–1.2)

## 2013-08-12 LAB — CBC
Hemoglobin: 10.1 g/dL — ABNORMAL LOW (ref 13.0–17.0)
Platelets: 412 10*3/uL — ABNORMAL HIGH (ref 150–400)
RBC: 4.21 MIL/uL — ABNORMAL LOW (ref 4.22–5.81)
WBC: 5.3 10*3/uL (ref 4.0–10.5)

## 2013-08-12 MED ORDER — ALBUTEROL SULFATE (5 MG/ML) 0.5% IN NEBU
2.5000 mg | INHALATION_SOLUTION | RESPIRATORY_TRACT | Status: DC | PRN
  Administered 2013-08-12 – 2013-08-16 (×7): 2.5 mg via RESPIRATORY_TRACT
  Filled 2013-08-12 (×9): qty 0.5

## 2013-08-12 MED ORDER — HYDROMORPHONE HCL PF 1 MG/ML IJ SOLN
INTRAMUSCULAR | Status: AC
  Administered 2013-08-12: 03:00:00
  Filled 2013-08-12: qty 1

## 2013-08-12 MED ORDER — ONDANSETRON HCL 4 MG PO TABS: 4.0000 mg | ORAL_TABLET | Freq: Four times a day (QID) | ORAL | Status: DC | PRN

## 2013-08-12 MED ORDER — BISACODYL 10 MG RE SUPP: 10.0000 mg | Freq: Every day | RECTAL | Status: DC | PRN

## 2013-08-12 MED ORDER — LORAZEPAM 1 MG PO TABS: 1.0000 mg | ORAL_TABLET | Freq: Four times a day (QID) | ORAL | Status: AC | PRN

## 2013-08-12 MED ORDER — METHOCARBAMOL 500 MG PO TABS
500.0000 mg | ORAL_TABLET | Freq: Three times a day (TID) | ORAL | Status: DC | PRN
  Filled 2013-08-12: qty 1

## 2013-08-12 MED ORDER — POVIDONE-IODINE 10 % EX SOLN
CUTANEOUS | Status: AC
  Administered 2013-08-12: 03:00:00
  Filled 2013-08-12: qty 118

## 2013-08-12 MED ORDER — IOHEXOL 300 MG/ML  SOLN
100.0000 mL | Freq: Once | INTRAMUSCULAR | Status: AC | PRN
  Administered 2013-08-12: 100 mL via INTRAVENOUS

## 2013-08-12 MED ORDER — FOLIC ACID 1 MG PO TABS
1.0000 mg | ORAL_TABLET | Freq: Every day | ORAL | Status: DC
  Administered 2013-08-12 – 2013-08-16 (×4): 1 mg via ORAL
  Filled 2013-08-12 (×6): qty 1

## 2013-08-12 MED ORDER — HYDROMORPHONE HCL PF 1 MG/ML IJ SOLN
INTRAMUSCULAR | Status: AC
  Administered 2013-08-12: 1 mg via INTRAVENOUS
  Filled 2013-08-12: qty 1

## 2013-08-12 MED ORDER — POTASSIUM CHLORIDE IN NACL 20-0.9 MEQ/L-% IV SOLN
INTRAVENOUS | Status: DC
  Administered 2013-08-12 – 2013-08-15 (×5): via INTRAVENOUS
  Filled 2013-08-12 (×11): qty 1000

## 2013-08-12 MED ORDER — ACETAMINOPHEN 325 MG PO TABS
650.0000 mg | ORAL_TABLET | ORAL | Status: DC | PRN
  Administered 2013-08-13 – 2013-08-16 (×3): 650 mg via ORAL
  Filled 2013-08-12 (×3): qty 2

## 2013-08-12 MED ORDER — IPRATROPIUM BROMIDE 0.02 % IN SOLN
0.5000 mg | RESPIRATORY_TRACT | Status: DC | PRN
  Administered 2013-08-12 – 2013-08-16 (×9): 0.5 mg via RESPIRATORY_TRACT
  Filled 2013-08-12 (×9): qty 2.5

## 2013-08-12 MED ORDER — ONDANSETRON HCL 4 MG/2ML IJ SOLN
4.0000 mg | Freq: Four times a day (QID) | INTRAMUSCULAR | Status: DC | PRN
  Administered 2013-08-12: 4 mg via INTRAVENOUS
  Filled 2013-08-12: qty 2

## 2013-08-12 MED ORDER — PANTOPRAZOLE SODIUM 40 MG IV SOLR
40.0000 mg | Freq: Every day | INTRAVENOUS | Status: DC
  Filled 2013-08-12: qty 40

## 2013-08-12 MED ORDER — ONDANSETRON HCL 4 MG/2ML IJ SOLN
4.0000 mg | Freq: Once | INTRAMUSCULAR | Status: AC
  Administered 2013-08-12: 4 mg via INTRAVENOUS
  Filled 2013-08-12: qty 2

## 2013-08-12 MED ORDER — THIAMINE HCL 100 MG/ML IJ SOLN
100.0000 mg | Freq: Every day | INTRAMUSCULAR | Status: DC
  Filled 2013-08-12: qty 1

## 2013-08-12 MED ORDER — MORPHINE SULFATE 2 MG/ML IJ SOLN
1.0000 mg | INTRAMUSCULAR | Status: DC | PRN
  Administered 2013-08-12 – 2013-08-17 (×8): 1 mg via INTRAVENOUS
  Filled 2013-08-12 (×7): qty 1

## 2013-08-12 MED ORDER — VITAMIN B-1 100 MG PO TABS
100.0000 mg | ORAL_TABLET | Freq: Every day | ORAL | Status: DC
  Administered 2013-08-12 – 2013-08-16 (×4): 100 mg via ORAL
  Filled 2013-08-12 (×6): qty 1

## 2013-08-12 MED ORDER — DOCUSATE SODIUM 100 MG PO CAPS
100.0000 mg | ORAL_CAPSULE | Freq: Two times a day (BID) | ORAL | Status: DC
  Administered 2013-08-12 – 2013-08-16 (×8): 100 mg via ORAL
  Filled 2013-08-12 (×11): qty 1

## 2013-08-12 MED ORDER — FENTANYL CITRATE 0.05 MG/ML IJ SOLN
50.0000 ug | INTRAMUSCULAR | Status: DC | PRN
  Administered 2013-08-12: 50 ug via INTRAVENOUS
  Filled 2013-08-12: qty 2

## 2013-08-12 MED ORDER — PANTOPRAZOLE SODIUM 40 MG PO TBEC
40.0000 mg | DELAYED_RELEASE_TABLET | Freq: Every day | ORAL | Status: DC
  Administered 2013-08-12 – 2013-08-16 (×4): 40 mg via ORAL
  Filled 2013-08-12 (×5): qty 1

## 2013-08-12 MED ORDER — ADULT MULTIVITAMIN W/MINERALS CH
1.0000 | ORAL_TABLET | Freq: Every day | ORAL | Status: DC
  Administered 2013-08-12 – 2013-08-16 (×4): 1 via ORAL
  Filled 2013-08-12 (×6): qty 1

## 2013-08-12 MED ORDER — LORAZEPAM 2 MG/ML IJ SOLN
1.0000 mg | Freq: Four times a day (QID) | INTRAMUSCULAR | Status: AC | PRN
  Administered 2013-08-13 – 2013-08-15 (×6): 1 mg via INTRAVENOUS
  Filled 2013-08-12 (×6): qty 1

## 2013-08-12 MED ORDER — HYDROMORPHONE HCL PF 1 MG/ML IJ SOLN
1.0000 mg | Freq: Once | INTRAMUSCULAR | Status: AC
  Administered 2013-08-12: 1 mg via INTRAVENOUS

## 2013-08-12 MED ORDER — OXYCODONE HCL 5 MG PO TABS
5.0000 mg | ORAL_TABLET | ORAL | Status: DC | PRN
  Administered 2013-08-12 – 2013-08-13 (×2): 5 mg via ORAL
  Filled 2013-08-12 (×2): qty 1

## 2013-08-12 MED ORDER — ENOXAPARIN SODIUM 30 MG/0.3ML ~~LOC~~ SOLN
30.0000 mg | SUBCUTANEOUS | Status: DC
  Administered 2013-08-12 – 2013-08-15 (×3): 30 mg via SUBCUTANEOUS
  Filled 2013-08-12 (×6): qty 0.3

## 2013-08-12 MED ORDER — LIDOCAINE-EPINEPHRINE (PF) 2 %-1:200000 IJ SOLN
INTRAMUSCULAR | Status: AC
  Administered 2013-08-12: 03:00:00
  Filled 2013-08-12: qty 20
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
## 2013-08-12 NOTE — Progress Notes (Signed)
Patient refused to give information regarding his history so I am unable to complete his admission, compliant with taking his medications.

## 2013-08-12 NOTE — ED Notes (Signed)
MD informed of pt's BP

## 2013-08-12 NOTE — ED Notes (Addendum)
Attempted to give report. No other RNs available to take report.

## 2013-08-12 NOTE — Progress Notes (Signed)
UR completed.  Minna Dumire, RN BSN [facility] CCM Trauma/Neuro ICU Case Manager [phone]  

## 2013-08-12 NOTE — ED Notes (Signed)
Dr Wilson at bedside.

## 2013-08-12 NOTE — ED Notes (Signed)
Report called and given to Charge RN, Clydie Braun on floor. RN has no further questions upon report given.
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
## 2013-08-12 NOTE — ED Notes (Signed)
Kerlix applied to pt's IV site. IV patent and secured.

## 2013-08-12 NOTE — ED Notes (Signed)
[facility] VS: BP: 144/87, HR: 90, RR: 28, 02: 100% NRB 15l/min

## 2013-08-12 NOTE — ED Notes (Signed)
Attempted to give report 

## 2013-08-12 NOTE — ED Notes (Signed)
Pt taken off of a NRB and switched to a nasal cannula per MD verbal order.
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
## 2013-08-13 ENCOUNTER — Inpatient Hospital Stay (HOSPITAL_COMMUNITY): Payer: PRIVATE HEALTH INSURANCE

## 2013-08-13 ENCOUNTER — Other Ambulatory Visit: Payer: Self-pay

## 2013-08-13 DIAGNOSIS — E43 Unspecified severe protein-calorie malnutrition: Secondary | ICD-10-CM | POA: Diagnosis present

## 2013-08-13 DIAGNOSIS — J9383 Other pneumothorax: Secondary | ICD-10-CM

## 2013-08-13 DIAGNOSIS — I214 Non-ST elevation (NSTEMI) myocardial infarction: Secondary | ICD-10-CM

## 2013-08-13 LAB — CBC
HCT: 33.2 % — ABNORMAL LOW (ref 39.0–52.0)
MCHC: 32.2 g/dL (ref 30.0–36.0)
MCV: 75.1 fL — ABNORMAL LOW (ref 78.0–100.0)
Platelets: 348 10*3/uL (ref 150–400)
RDW: 19.4 % — ABNORMAL HIGH (ref 11.5–15.5)
WBC: 14.1 10*3/uL — ABNORMAL HIGH (ref 4.0–10.5)

## 2013-08-13 LAB — BLOOD GAS, ARTERIAL
Acid-Base Excess: 1.4 mmol/L (ref 0.0–2.0)
O2 Saturation: 100 %
Patient temperature: 98.6
TCO2: 27.2 mmol/L (ref 0–100)
pO2, Arterial: 191 mmHg — ABNORMAL HIGH (ref 80.0–100.0)

## 2013-08-13 LAB — BASIC METABOLIC PANEL
BUN: 13 mg/dL (ref 6–23)
Calcium: 9.5 mg/dL (ref 8.4–10.5)
Chloride: 97 mEq/L (ref 96–112)
GFR calc Af Amer: >90 mL/min (ref >90)
GFR calc non Af Amer: 86 mL/min — ABNORMAL LOW (ref >90)
Potassium: 4.9 mEq/L (ref 3.5–5.1)
Sodium: 132 mEq/L — ABNORMAL LOW (ref 135–145)

## 2013-08-13 LAB — CK TOTAL AND CKMB (NOT AT ARMC)
CK, MB: 10 ng/mL (ref 0.3–4.0)
Relative Index: 3.6 — ABNORMAL HIGH (ref 0.0–2.5)
Total CK: 275 U/L — ABNORMAL HIGH (ref 7–232)

## 2013-08-13 LAB — TROPONIN I
Troponin I: 0.56 ng/mL (ref <0.30)
Troponin I: 0.56 ng/mL (ref <0.30)

## 2013-08-13 MED ORDER — IOHEXOL 350 MG/ML SOLN
100.0000 mL | Freq: Once | INTRAVENOUS | Status: AC | PRN
  Administered 2013-08-13: 100 mL via INTRAVENOUS

## 2013-08-13 MED ORDER — LORAZEPAM 2 MG/ML IJ SOLN
2.0000 mg | Freq: Once | INTRAMUSCULAR | Status: AC
  Administered 2013-08-13: 2 mg via INTRAVENOUS
  Filled 2013-08-13: qty 1

## 2013-08-13 MED ORDER — METOPROLOL TARTRATE 25 MG PO TABS
25.0000 mg | ORAL_TABLET | Freq: Two times a day (BID) | ORAL | Status: DC
  Administered 2013-08-14: 25 mg via ORAL
  Filled 2013-08-13 (×3): qty 1

## 2013-08-13 MED ORDER — FLUMAZENIL 0.5 MG/5ML IV SOLN
0.2000 mg | Freq: Once | INTRAVENOUS | Status: AC
  Administered 2013-08-13: 0.2 mg via INTRAVENOUS
  Filled 2013-08-13: qty 5

## 2013-08-13 MED ORDER — HALOPERIDOL LACTATE 5 MG/ML IJ SOLN
INTRAMUSCULAR | Status: AC
  Administered 2013-08-13: 5 mg
  Filled 2013-08-13: qty 1

## 2013-08-13 MED ORDER — ENSURE COMPLETE PO LIQD
237.0000 mL | Freq: Two times a day (BID) | ORAL | Status: DC
  Administered 2013-08-14 – 2013-08-16 (×4): 237 mL via ORAL

## 2013-08-13 MED ORDER — HALOPERIDOL LACTATE 5 MG/ML IJ SOLN
5.0000 mg | Freq: Once | INTRAMUSCULAR | Status: AC
  Administered 2013-08-13: 5 mg via INTRAVENOUS

## 2013-08-13 NOTE — Progress Notes (Signed)
Patient transferred to ICU as ordered, report given to [facility].  Dr. Leonard Schwartz.[name] was made aware about critical lab results.

## 2013-08-13 NOTE — Progress Notes (Signed)
Quite ornery. Appreciate psych eval. Water seal CT. Patient examined and I agree with the assessment and plan  Violeta Gelinas, MD, MPH, FACS Pager: [phone]  08/13/2013 9:19 AM
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
## 2013-08-13 NOTE — Progress Notes (Signed)
PT Cancellation Note  Patient Details Name: [name] MRN: [id number] DOB: Jan 11, 1930   Cancelled Treatment:    Reason Eval/Treat Not Completed: Patient's level of consciousness (has had Ativan, per CNa, very somnolent.)   Rada Hay 08/13/2013, 2:24 PM Blanchard Kelch PT [phone]

## 2013-08-13 NOTE — Progress Notes (Signed)
Patient ID: MARKEL KURTENBACH, male   DOB: 1930-04-20, 79 y.o.   MRN: [id number] Agitated likely due to hypoxia. Pulled out his CT - CXR tiny R PTX. Troponin mildly elevated. Will have cardiology see. CT angio chest and CT A/P P.Transfer to ICU. Violeta Gelinas, MD, MPH, FACS Pager: [phone]
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
## 2013-08-14 ENCOUNTER — Inpatient Hospital Stay (HOSPITAL_COMMUNITY): Payer: PRIVATE HEALTH INSURANCE

## 2013-08-14 DIAGNOSIS — R410 Disorientation, unspecified: Secondary | ICD-10-CM | POA: Diagnosis not present

## 2013-08-14 DIAGNOSIS — R404 Transient alteration of awareness: Secondary | ICD-10-CM

## 2013-08-14 DIAGNOSIS — R079 Chest pain, unspecified: Secondary | ICD-10-CM

## 2013-08-14 DIAGNOSIS — I369 Nonrheumatic tricuspid valve disorder, unspecified: Secondary | ICD-10-CM

## 2013-08-14 LAB — BASIC METABOLIC PANEL
BUN: 9 mg/dL (ref 6–23)
CO2: 25 mEq/L (ref 19–32)
Calcium: 8.8 mg/dL (ref 8.4–10.5)
GFR calc Af Amer: >90 mL/min (ref >90)
GFR calc non Af Amer: 86 mL/min — ABNORMAL LOW (ref >90)
Glucose, Bld: 81 mg/dL (ref 70–99)
Potassium: 4.8 mEq/L (ref 3.5–5.1)

## 2013-08-14 LAB — URINALYSIS, ROUTINE W REFLEX MICROSCOPIC
Glucose, UA: NEGATIVE mg/dL
Hgb urine dipstick: NEGATIVE
Leukocytes, UA: NEGATIVE
Protein, ur: NEGATIVE mg/dL
pH: 5 (ref 5.0–8.0)

## 2013-08-14 LAB — TROPONIN I
Troponin I: <0.3 ng/mL (ref <0.30)
Troponin I: <0.3 ng/mL (ref <0.30)

## 2013-08-14 LAB — CBC
HCT: 31.5 % — ABNORMAL LOW (ref 39.0–52.0)
Hemoglobin: 9.9 g/dL — ABNORMAL LOW (ref 13.0–17.0)
MCH: 23.7 pg — ABNORMAL LOW (ref 26.0–34.0)
MCHC: 31.4 g/dL (ref 30.0–36.0)
MCV: 75.5 fL — ABNORMAL LOW (ref 78.0–100.0)
RDW: 19.5 % — ABNORMAL HIGH (ref 11.5–15.5)

## 2013-08-14 MED ORDER — TRAMADOL HCL 50 MG PO TABS
50.0000 mg | ORAL_TABLET | Freq: Four times a day (QID) | ORAL | Status: DC | PRN
  Administered 2013-08-16 (×2): 50 mg via ORAL
  Filled 2013-08-14 (×2): qty 1

## 2013-08-14 MED ORDER — SPIRITUS FRUMENTI
1.0000 | Freq: Three times a day (TID) | ORAL | Status: DC
  Administered 2013-08-16 (×2): 1 via ORAL
  Filled 2013-08-14 (×13): qty 1

## 2013-08-14 MED ORDER — METOPROLOL TARTRATE 50 MG PO TABS
50.0000 mg | ORAL_TABLET | Freq: Two times a day (BID) | ORAL | Status: DC
  Administered 2013-08-14 – 2013-08-16 (×5): 50 mg via ORAL
  Filled 2013-08-14 (×9): qty 1
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
## 2013-08-14 NOTE — Clinical Social Work Note (Signed)
Clinical Social Worker continuing to follow patient for support and discharge planning needs.  Patient has been moved to Neuro/Trauma ICU due to concerns of hypoxia yesterday.  Patient is alert to self only at this time and no family is able to be reached.  CSW will continue to reach out to appropriate family members for additional support and potential decision making pending patient progress and mental status.  CSW available for support as needed.  Macario Golds, [place] [phone]
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
## 2013-08-14 NOTE — Progress Notes (Signed)
Echocardiogram 2D Echocardiogram has been performed.  [name] 08/14/2013, 4:06 PM
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
## 2013-08-14 NOTE — Progress Notes (Signed)
Seen and agree  

## 2013-08-15 ENCOUNTER — Inpatient Hospital Stay (HOSPITAL_COMMUNITY): Payer: PRIVATE HEALTH INSURANCE

## 2013-08-15 LAB — CBC
HCT: 31.6 % — ABNORMAL LOW (ref 39.0–52.0)
Hemoglobin: 9.9 g/dL — ABNORMAL LOW (ref 13.0–17.0)
MCH: 24 pg — ABNORMAL LOW (ref 26.0–34.0)
MCV: 76.5 fL — ABNORMAL LOW (ref 78.0–100.0)
RBC: 4.13 MIL/uL — ABNORMAL LOW (ref 4.22–5.81)
RDW: 19.5 % — ABNORMAL HIGH (ref 11.5–15.5)
WBC: 15.3 10*3/uL — ABNORMAL HIGH (ref 4.0–10.5)
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
## 2013-08-15 NOTE — Clinical Social Work Note (Signed)
Clinical Social Worker continuing to follow patient and family for support and discharge planning needs.  Patient has been deemed to have capacity, however at this time is alert and oriented to self only.  Both CSW and MD have attempted to reach patient family without success at this time.  Per PT/OT patient will likely need SNF placement at discharge.  CSW to continue to follow patient and attempt to reach family regarding patient plans at discharge.  CSW remains available for continued support and to facilitate patient discharge needs once medically stable.  Macario Golds, [place] [phone]

## 2013-08-15 NOTE — Progress Notes (Addendum)
Trauma Service Note  Subjective: Patient is very sleepy, but did receive some morphine earlier this morning.  Only 1 mg.  When asked about his whereabouts, he said in garbled tones that he was in the hospital--I think.  Objective: Vital signs in last 24 hours: Temp:  [98 F (36.7 C)-101.3 F (38.5 C)] 98 F (36.7 C) (10/16 0408) Pulse Rate:  [85-134] 110 (10/16 0700) Resp:  [14-31] 30 (10/16 0700) BP: (92-149)/(53-98) 145/93 mmHg (10/16 0700) SpO2:  [85 %-100 %] 92 % (10/16 0742) Last BM Date: 08/05/2013  Intake/Output from previous day: 10/15 0701 - 10/16 0700 In: 1200 [I.V.:1200] Out: 840 [Urine:840] Intake/Output this shift:    General: No distress.    Lungs: Clear bilaterally.  Sats are okay on FIO2 4L  Abd: Benign  Extremities: No swelling or edema.  No clinical signs of DVT  Neuro: Difficult to identify any focal neurological deficit.    Lab Results: CBC   Recent Labs  08/14/13 0650 08/15/13 0345  WBC 13.8* 15.3*  HGB 9.9* 9.9*  HCT 31.5* 31.6*  PLT 299 324   BMET  Recent Labs  08/13/13 1436 08/14/13 0650  NA 132* 135  K 4.9 4.8  CL 97 101  CO2 28 25  GLUCOSE 109* 81  BUN 13 9  CREATININE 0.71 0.71  CALCIUM 9.5 8.8   PT/INR No results found for this basename: LABPROT, INR,  in the last 72 hours ABG  Recent Labs  08/13/13 1635  PHART 7.388  HCO3 25.9*    Studies/Results: Ct Angio Chest Pe W/cm &/or Wo Cm  08/13/2013   CLINICAL DATA:  Status post trauma. The patient removed his chest tube. Possible free intraperitoneal air.  EXAM: CT ANGIOGRAPHY CHEST  CT ABDOMEN AND PELVIS WITH CONTRAST  TECHNIQUE: Multidetector CT imaging of the chest was performed using the standard protocol during bolus administration of intravenous contrast. Multiplanar CT image reconstructions including MIPs were obtained to evaluate the vascular anatomy. Multidetector CT imaging of the abdomen and pelvis was performed using the standard protocol during bolus  administration of intravenous contrast.  CONTRAST:  OMNIPAQUE IOHEXOL 350 MG/ML SOLN  COMPARISON:  Plain films of the chest earlier this same day. CT chest, abdomen and pelvis 08/12/2013.  FINDINGS: CTA CHEST FINDINGS  No pulmonary embolus is identified. Subcutaneous emphysema along the right chest wall is noted. There is no pleural or pericardial effusion. No axillary, hilar or mediastinal lymphadenopathy. Coronary atherosclerotic vascular disease is noted. Right pneumothorax is markedly decreased in size compared to the CT scan yesterday and is estimated at 10-15 percent. Tract from prior chest tube is noted. There is no left pneumothorax. Very mild remote T5 superior endplate compression fracture is unchanged. Nondisplaced right 4th rib fracture is again identified.  CT ABDOMEN and PELVIS FINDINGS  No free intraperitoneal air is identified. Colonic interposition in the right upper quadrant is again seen ending and accounts for findings on plain film of the chest.  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 The liver, spleen, adrenal glands, left kidney and pancreas appear normal. There is some contrast material gallbladder from the patient's CT scan yesterday. Small amount of free pelvic fluid is noted. The fluid is simple. The stomach and small and large bowel appear normal. No focal bony abnormality. Prominent Schmorl's node or remote inferior endplate compression fracture L3 is noted.  Review of the MIP images confirms the above findings.  IMPRESSION: CTA CHEST IMPRESSION  Negative for pulmonary embolus.  Marked decrease in  right pneumothorax since the CT scan yesterday. Pneumothorax is estimated at 10-15%.  No new abnormality since the prior CT.  CT ABDOMEN and PELVIS IMPRESSION  Negative for free intraperitoneal air.  Tiny amount of simple free pelvic fluid is likely due to IV fluid administration.   Electronically Signed   By: Drusilla Kanner M.D.   On: 08/13/2013 16:53   Ct Abdomen  Pelvis W Contrast  08/13/2013   CLINICAL DATA:  Status post trauma. The patient removed his chest tube. Possible free intraperitoneal air.  EXAM: CT ANGIOGRAPHY CHEST  CT ABDOMEN AND PELVIS WITH CONTRAST  TECHNIQUE: Multidetector CT imaging of the chest was performed using the standard protocol during bolus administration of intravenous contrast. Multiplanar CT image reconstructions including MIPs were obtained to evaluate the vascular anatomy. Multidetector CT imaging of the abdomen and pelvis was performed using the standard protocol during bolus administration of intravenous contrast.  CONTRAST:  OMNIPAQUE IOHEXOL 350 MG/ML SOLN  COMPARISON:  Plain films of the chest earlier this same day. CT chest, abdomen and pelvis 08/12/2013.  FINDINGS: CTA CHEST FINDINGS  No pulmonary embolus is identified. Subcutaneous emphysema along the right chest wall is noted. There is no pleural or pericardial effusion. No axillary, hilar or mediastinal lymphadenopathy. Coronary atherosclerotic vascular disease is noted. Right pneumothorax is markedly decreased in size compared to the CT scan yesterday and is estimated at 10-15 percent. Tract from prior chest tube is noted. There is no left pneumothorax. Very mild remote T5 superior endplate compression fracture is unchanged. Nondisplaced right 4th rib fracture is again identified.  CT ABDOMEN and PELVIS FINDINGS  No free intraperitoneal air is identified. Colonic interposition in the right upper quadrant is again seen ending and accounts for findings on plain film of the chest.  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 The liver, spleen, adrenal glands, left kidney and pancreas appear normal. There is some contrast material gallbladder from the patient's CT scan yesterday. Small amount of free pelvic fluid is noted. The fluid is simple. The stomach and small and large bowel appear normal. No focal bony abnormality. Prominent Schmorl's node or remote inferior  endplate compression fracture L3 is noted.  Review of the MIP images confirms the above findings.  IMPRESSION: CTA CHEST IMPRESSION  Negative for pulmonary embolus.  Marked decrease in right pneumothorax since the CT scan yesterday. Pneumothorax is estimated at 10-15%.  No new abnormality since the prior CT.  CT ABDOMEN and PELVIS IMPRESSION  Negative for free intraperitoneal air.  Tiny amount of simple free pelvic fluid is likely due to IV fluid administration.   Electronically Signed   By: Drusilla Kanner M.D.   On: 08/13/2013 16:53   Dg Chest Port 1 View  08/14/2013   CLINICAL DATA:  Right pneumothorax.  EXAM: PORTABLE CHEST - 1 VIEW  COMPARISON:  Chest x-ray dated 08/13/2013  FINDINGS: The pneumothorax on the right has resolved. There are no infiltrates or effusions. Chronic accentuation of the interstitial markings in the upper lung zones. Heart size and vascularity are normal.  IMPRESSION: Resolution of the small right pneumothorax.   Electronically Signed   By: Geanie Cooley M.D.   On: 08/14/2013 07:46   Dg Chest Port 1 View  08/13/2013   CLINICAL DATA:  Patient pulled out chest tube  EXAM: PORTABLE CHEST - 1 VIEW  COMPARISON:  Portable chest x-ray of 08/13/2013  FINDINGS: There is a small right apical pneumothorax after the right chest tube has been removed. Otherwise  the lungs are clear. Mediastinal contours are stable. Heart size is stable. Lucency remains under of the right hemidiaphragm as previously noted.  IMPRESSION: 1. Small right pneumothorax after removal of right chest tube. 2. Lucency remains under the right hemidiaphragm representing either gas in the hepatic flexure of colon or possibly free air. Correlate clinically.   Electronically Signed   By: Dwyane Dee M.D.   On: 08/13/2013 15:36   Dg Chest Port 1 View  08/13/2013   CLINICAL DATA:  Desaturation.  EXAM: PORTABLE CHEST - 1 VIEW  COMPARISON:  08/13/2013  FINDINGS: The right chest tube remains near the apex. There is no  significant pneumothorax. Large amount of lucency underneath the right hemidiaphragm appears to represent bowel and similar to the previous examination. There is no focal airspace disease. Again noted are interstitial densities in the right upper lung which could represent asymmetric edema are atelectasis. Normal appearance of the heart and mediastinum. The trachea is midline.  IMPRESSION: Stable position of the right chest tube without a definite pneumothorax.  Again noted is elevation of the right hemidiaphragm and there appears to be gas-filled colon underneath the hemidiaphragm. If there is clinical concern for free air, recommend a decubitus study.   Electronically Signed   By: Richarda Overlie M.D.   On: 08/13/2013 14:51    Anti-infectives: Anti-infectives   None      Assessment/Plan: s/p  Advance diet Difficult social situation Attempted to contact his estranged wife and his son, unsuccessful.  He gets very agitated and tries to remove multiple pieces of equipment. Can be transferred to SDU with sitter. Will continue to try to contact family Check CXR today.  WBC is increased but UA is negative.  Not sure of possible source.  Will recheck tomorrow.  With his change in mental status, he needs a stat head CT scan.  LOS: 4 days   Marta Lamas. Gae Bon, MD, FACS [phone] Trauma Surgeon 08/15/2013

## 2013-08-15 NOTE — Progress Notes (Signed)
Pt tx from 3100, pt VSS, pt oriented to room

## 2013-08-16 ENCOUNTER — Inpatient Hospital Stay (HOSPITAL_COMMUNITY): Payer: PRIVATE HEALTH INSURANCE

## 2013-08-16 LAB — CBC WITH DIFFERENTIAL/PLATELET
Basophils Relative: 0 % (ref 0–1)
Eosinophils Absolute: 0 10*3/uL (ref 0.0–0.7)
Eosinophils Relative: 0 % (ref 0–5)
HCT: 32.4 % — ABNORMAL LOW (ref 39.0–52.0)
Hemoglobin: 9.9 g/dL — ABNORMAL LOW (ref 13.0–17.0)
Lymphs Abs: 0.7 10*3/uL (ref 0.7–4.0)
MCH: 23.5 pg — ABNORMAL LOW (ref 26.0–34.0)
MCHC: 30.6 g/dL (ref 30.0–36.0)
MCV: 77 fL — ABNORMAL LOW (ref 78.0–100.0)
Monocytes Absolute: 1.5 10*3/uL — ABNORMAL HIGH (ref 0.1–1.0)
Monocytes Relative: 10 % (ref 3–12)
RBC: 4.21 MIL/uL — ABNORMAL LOW (ref 4.22–5.81)
RDW: 19.8 % — ABNORMAL HIGH (ref 11.5–15.5)

## 2013-08-16 MED ORDER — ENOXAPARIN SODIUM 40 MG/0.4ML ~~LOC~~ SOLN
40.0000 mg | SUBCUTANEOUS | Status: DC
  Administered 2013-08-16 – 2013-08-18 (×3): 40 mg via SUBCUTANEOUS
  Filled 2013-08-16 (×3): qty 0.4

## 2013-08-16 NOTE — Progress Notes (Signed)
OT Cancellation Note  Patient Details Name: [name] MRN: [id number] DOB: 15-Dec-1929   Cancelled Treatment:    Reason Eval/Treat Not Completed: Other (comment) (Pt being transferred. will return later if able.)  [facility], OTR/L  [phone] 08/16/2013 08/16/2013, 5:45 PM

## 2013-08-16 NOTE — Progress Notes (Signed)
  Subjective: Following commands, but confused. Hemodynamically stable  Objective: Vital signs in last 24 hours: Temp:  [99.6 F (37.6 C)-102 F (38.9 C)] 99.6 F (37.6 C) (10/17 0405) Pulse Rate:  [83-110] 95 (10/17 0405) Resp:  [16-34] 26 (10/17 0405) BP: (93-138)/(51-105) 136/76 mmHg (10/17 0405) SpO2:  [88 %-100 %] 100 % (10/17 0405) Last BM Date: 08/20/2013  Intake/Output from previous day: 10/16 0701 - 10/17 0700 In: 1100 [I.V.:1100] Out: 675 [Urine:675] Intake/Output this shift:    Lungs with wheezing bilaterally CV tachy Abdomen soft, non tender  Lab Results:   Recent Labs  08/15/13 0345 08/16/13 0400  WBC 15.3* 14.4*  HGB 9.9* 9.9*  HCT 31.6* 32.4*  PLT 324 330   BMET  Recent Labs  08/13/13 1436 08/14/13 0650  NA 132* 135  K 4.9 4.8  CL 97 101  CO2 28 25  GLUCOSE 109* 81  BUN 13 9  CREATININE 0.71 0.71  CALCIUM 9.5 8.8   PT/INR No results found for this basename: LABPROT, INR,  in the last 72 hours ABG  Recent Labs  08/13/13 1635  PHART 7.388  HCO3 25.9*    Studies/Results: Ct Head Wo Contrast  08/15/2013   CLINICAL DATA:  Trauma.  EXAM: CT HEAD WITHOUT CONTRAST  TECHNIQUE: Contiguous axial images were obtained from the base of the skull through the vertex without intravenous contrast.  COMPARISON:  08/08/2012 head CT.  FINDINGS: Exam is motion degraded.  No skull fracture or intracranial hemorrhage.  Moderate global atrophy.  Small vessel disease type changes without CT evidence of large acute infarct.  Vascular calcifications.  IMPRESSION: Exam is motion degraded.  No skull fracture or intracranial hemorrhage.  Moderate global atrophy.  Small vessel disease type changes without CT evidence of large acute infarct.   Electronically Signed   By: Bridgett Larsson M.D.   On: 08/15/2013 10:05   Dg Chest Port 1 View  08/15/2013   CLINICAL DATA:  Chest pain. Rib fracture/ trauma.  EXAM: PORTABLE CHEST - 1 VIEW  COMPARISON:  08/14/2013  FINDINGS:  Patient rotated minimally left. Midline trachea. Normal heart size. Right hemidiaphragm elevation is moderate. No pleural fluid. Artifact projects over the right apex. No definite pneumothorax. Interstitial thickening is slightly upper lobe predominant and not significantly changed. No lobar consolidation. Mild volume loss at the left lung base.  IMPRESSION: No pneumothorax or interval change. There is support apparatus artifact which projects over the right apex, slightly diminishing sensitivity.   Electronically Signed   By: Jeronimo Greaves M.D.   On: 08/15/2013 08:47    Anti-infectives: Anti-infectives   None      Assessment/Plan: s/p * No surgery found *  Continue in step down until less confused Will need PT/OT eventually Follow WBC Check CXR  LOS: 5 days    Ofilia Rayon A 08/16/2013
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
## 2013-08-17 ENCOUNTER — Inpatient Hospital Stay (HOSPITAL_COMMUNITY): Payer: PRIVATE HEALTH INSURANCE

## 2013-08-17 DIAGNOSIS — G9340 Encephalopathy, unspecified: Secondary | ICD-10-CM | POA: Diagnosis not present

## 2013-08-17 DIAGNOSIS — S270XXA Traumatic pneumothorax, initial encounter: Secondary | ICD-10-CM

## 2013-08-17 DIAGNOSIS — R404 Transient alteration of awareness: Secondary | ICD-10-CM

## 2013-08-17 DIAGNOSIS — J9600 Acute respiratory failure, unspecified whether with hypoxia or hypercapnia: Secondary | ICD-10-CM

## 2013-08-17 DIAGNOSIS — J95821 Acute postprocedural respiratory failure: Secondary | ICD-10-CM

## 2013-08-17 LAB — POCT I-STAT 3, ART BLOOD GAS (G3+)
Acid-Base Excess: 1 mmol/L (ref 0.0–2.0)
Bicarbonate: 29.3 meq/L — ABNORMAL HIGH (ref 20.0–24.0)
TCO2: 31 mmol/L (ref 0–100)
pCO2 arterial: 64.8 mmHg (ref 35.0–45.0)
pH, Arterial: 7.264 — ABNORMAL LOW (ref 7.350–7.450)

## 2013-08-17 LAB — COMPREHENSIVE METABOLIC PANEL
AST: 35 U/L (ref 0–37)
Albumin: 2.4 g/dL — ABNORMAL LOW (ref 3.5–5.2)
BUN: 16 mg/dL (ref 6–23)
CO2: 25 mEq/L (ref 19–32)
Calcium: 9.1 mg/dL (ref 8.4–10.5)
Chloride: 99 mEq/L (ref 96–112)
Creatinine, Ser: 0.87 mg/dL (ref 0.50–1.35)
GFR calc non Af Amer: 79 mL/min — ABNORMAL LOW (ref >90)
Glucose, Bld: 95 mg/dL (ref 70–99)
Potassium: 5.3 mEq/L — ABNORMAL HIGH (ref 3.5–5.1)

## 2013-08-17 LAB — CBC
Hemoglobin: 9.3 g/dL — ABNORMAL LOW (ref 13.0–17.0)
MCH: 24.3 pg — ABNORMAL LOW (ref 26.0–34.0)
Platelets: 161 10*3/uL (ref 150–400)
RBC: 3.83 MIL/uL — ABNORMAL LOW (ref 4.22–5.81)
WBC: 11.1 10*3/uL — ABNORMAL HIGH (ref 4.0–10.5)

## 2013-08-17 LAB — GLUCOSE, CAPILLARY: Glucose-Capillary: 103 mg/dL — ABNORMAL HIGH (ref 70–99)

## 2013-08-17 MED ORDER — VITAL AF 1.2 CAL PO LIQD
1000.0000 mL | ORAL | Status: DC
  Filled 2013-08-17 (×2): qty 1000

## 2013-08-17 MED ORDER — BIOTENE DRY MOUTH MT LIQD
15.0000 mL | Freq: Four times a day (QID) | OROMUCOSAL | Status: DC
  Administered 2013-08-17 – 2013-08-18 (×5): 15 mL via OROMUCOSAL

## 2013-08-17 MED ORDER — MIDAZOLAM HCL 2 MG/2ML IJ SOLN
INTRAMUSCULAR | Status: AC
  Administered 2013-08-17: 2 mg
  Filled 2013-08-17: qty 2

## 2013-08-17 MED ORDER — MIDAZOLAM HCL 2 MG/2ML IJ SOLN: 2.0000 mg | Freq: Once | INTRAMUSCULAR | Status: AC

## 2013-08-17 MED ORDER — FAMOTIDINE 40 MG/5ML PO SUSR
20.0000 mg | Freq: Every day | ORAL | Status: DC
  Administered 2013-08-18: 20 mg
  Filled 2013-08-17 (×2): qty 2.5

## 2013-08-17 MED ORDER — ALBUTEROL SULFATE (5 MG/ML) 0.5% IN NEBU
2.5000 mg | INHALATION_SOLUTION | Freq: Four times a day (QID) | RESPIRATORY_TRACT | Status: DC
  Administered 2013-08-17 – 2013-08-18 (×4): 2.5 mg via RESPIRATORY_TRACT
  Filled 2013-08-17 (×4): qty 0.5

## 2013-08-17 MED ORDER — FENTANYL CITRATE 0.05 MG/ML IJ SOLN
50.0000 ug | INTRAMUSCULAR | Status: DC | PRN
  Administered 2013-08-17 (×3): 100 ug via INTRAVENOUS
  Administered 2013-08-18: 50 ug via INTRAVENOUS
  Administered 2013-08-18: 100 ug via INTRAVENOUS
  Filled 2013-08-17 (×5): qty 2

## 2013-08-17 MED ORDER — VITAL AF 1.2 CAL PO LIQD
1000.0000 mL | ORAL | Status: DC
  Filled 2013-08-17 (×3): qty 1000

## 2013-08-17 MED ORDER — SODIUM CHLORIDE 0.9 % IV SOLN
INTRAVENOUS | Status: DC
  Administered 2013-08-17: 13:00:00 via INTRAVENOUS
  Administered 2013-08-17 – 2013-08-18 (×2): 100 mL/h via INTRAVENOUS
  Administered 2013-08-18: 12:00:00 via INTRAVENOUS

## 2013-08-17 MED ORDER — ALBUTEROL SULFATE (5 MG/ML) 0.5% IN NEBU: 2.5000 mg | INHALATION_SOLUTION | RESPIRATORY_TRACT | Status: DC | PRN

## 2013-08-17 MED ORDER — CHLORHEXIDINE GLUCONATE 0.12 % MT SOLN
15.0000 mL | Freq: Two times a day (BID) | OROMUCOSAL | Status: DC
  Administered 2013-08-17 – 2013-08-18 (×3): 15 mL via OROMUCOSAL
  Filled 2013-08-17 (×3): qty 15

## 2013-08-17 MED ORDER — FENTANYL CITRATE 0.05 MG/ML IJ SOLN
INTRAMUSCULAR | Status: AC
  Administered 2013-08-17: 100 ug
  Filled 2013-08-17: qty 2

## 2013-08-17 MED ORDER — MIDAZOLAM HCL 2 MG/2ML IJ SOLN
2.0000 mg | INTRAMUSCULAR | Status: DC | PRN
  Administered 2013-08-17 – 2013-08-18 (×4): 2 mg via INTRAVENOUS
  Filled 2013-08-17: qty 2
  Filled 2013-08-17: qty 4
  Filled 2013-08-17 (×2): qty 2

## 2013-08-17 MED ORDER — SODIUM CHLORIDE 0.9 % IV SOLN: 100.0000 ug/h | INTRAVENOUS | Status: DC

## 2013-08-17 MED ORDER — METOPROLOL TARTRATE 25 MG PO TABS
25.0000 mg | ORAL_TABLET | Freq: Two times a day (BID) | ORAL | Status: DC
  Administered 2013-08-18 (×2): 25 mg
  Filled 2013-08-17 (×4): qty 1

## 2013-08-17 NOTE — Significant Event (Signed)
Notified patient's family regarding patient's transfer to 2S. Spoken with a male who identify himself as patient's son by the name "Buddy". Unable to reach patient's spouse. Told Buddy that patient is in our unit and given him our unit telephone number. Advise him to have his mother call us. Lygia Olaes, Charity fundraiser.

## 2013-08-17 NOTE — Significant Event (Signed)
Rapid Response Event Note  Overview:  Called to assist with Patient at 0900. Upon arrival patient resp shallow and rapid.  PA at bedside. PCXR already taken and resulted. Patien    Initial Focused Assessment: Sats variable but mostly in 80s. NRB mask on. NOt responsive to commands or questions. Reported that pt DCd his own chest tube, and would is now leaking. Lungs to ausc are rhonchus. Slight cough non productive when repositioned. BPs recorded in 110's on monitor. HR rapid 120-135. Skin dusky.  Interventions: Patient repositioned with some improvement in Sats into 90s, but rapid degradation back into the 80s. O2 Sat wave form variable in integrity. CCM physicians and PA arrived. Versed 2mg  and Fentanyl 100 mcg pushed in L IV and flushed with NS. Patient then intubated at bedside and placed on Ventilator. Suctioning performed by Resp therapist.  Event Summary:  Transferred to 2307 at  0945. Received by 2300 RNs and bedside report included IV sedation given and need for PCXR to confirm ET tube placement. Of note, Chest tube tray is already at bedside as requested.     at          Kristine Linea
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
## 2013-08-17 NOTE — Progress Notes (Signed)
Seen, agree with above.   Intubated for respiratory distress.    Will get sputum culture.  Get LL decubitus abdominal film.

## 2013-08-17 NOTE — Procedures (Signed)
Intubation Procedure Note [name] [id number] [birth year]  Procedure: Intubation Indications: Respiratory insufficiency  Procedure Details Consent: Risks of procedure as well as the alternatives and risks of each were explained to the (patient/caregiver).  Consent for procedure obtained. Time Out: Verified patient identification, verified procedure, site/side was marked, verified correct patient position, special equipment/implants available, medications/allergies/relevent history reviewed, required imaging and test results available.  Performed  3 Medications:  Fentanyl 100 mcg Etomidate Versed 2 mg NMB    Evaluation Hemodynamic Status: BP stable throughout; O2 sats: stable throughout Patient's Current Condition: stable Complications: No apparent complications Patient did tolerate procedure well. Chest X-ray ordered to verify placement.  CXR: pending.   Brett Canales Minor ACNP [facility] PCCM Pager [phone] till 3 pm If no answer page [phone] 08/17/2013, 9:56 AM  I was present for and supervised the entire procedure  Billy Fischer, MD ; [facility] service Mobile [phone].  After 5:30 PM or weekends, call [phone]
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
## 2013-08-18 ENCOUNTER — Inpatient Hospital Stay (HOSPITAL_COMMUNITY): Payer: PRIVATE HEALTH INSURANCE

## 2013-08-18 DIAGNOSIS — I469 Cardiac arrest, cause unspecified: Secondary | ICD-10-CM

## 2013-08-18 LAB — GLUCOSE, CAPILLARY
Glucose-Capillary: 76 mg/dL (ref 70–99)
Glucose-Capillary: 65 mg/dL — ABNORMAL LOW (ref 70–99)

## 2013-08-18 MED ORDER — DEXMEDETOMIDINE HCL IN NACL 200 MCG/50ML IV SOLN
0.2000 ug/kg/h | INTRAVENOUS | Status: DC
  Administered 2013-08-18: 0.2 ug/kg/h via INTRAVENOUS
  Filled 2013-08-18: qty 50

## 2013-08-18 MED ORDER — LORAZEPAM 2 MG/ML IJ SOLN: 0.5000 mg | INTRAMUSCULAR | Status: DC | PRN

## 2013-08-18 MED ORDER — FENTANYL CITRATE 0.05 MG/ML IJ SOLN: 25.0000 ug | INTRAMUSCULAR | Status: DC | PRN

## 2013-08-19 LAB — CULTURE, RESPIRATORY W GRAM STAIN: Gram Stain: NONE SEEN

## 2013-08-19 MED FILL — Medication: Qty: 1 | Status: AC
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
## 2013-08-28 LAB — POCT I-STAT, CHEM 8
BUN: 28 mg/dL — ABNORMAL HIGH (ref 6–23)
Calcium, Ion: 1.25 mmol/L (ref 1.13–1.30)
Chloride: 107 meq/L (ref 96–112)
Creatinine, Ser: 1.1 mg/dL (ref 0.50–1.35)
Potassium: 5.7 meq/L — ABNORMAL HIGH (ref 3.5–5.1)
Sodium: 139 meq/L (ref 135–145)

## 2013-08-31 NOTE — Progress Notes (Deleted)
K+= 3.7 and creat= 0.46 w/ urine o/p > 30cc/hr; TCTS KCL protocol initiated with 10 mEq / 50 cc KCL x 3, each over 1 hour. 

## 2013-08-31 NOTE — Progress Notes (Deleted)
CRITICAL VALUE ALERT  Critical value received:  Platelet count 964K  Date of notification:  t  Time of notification:  0512   Critical value read back:yes  Nurse who received alert:  Aura Camps   MD notified (1st page): Dr Laneta Simmers  Time of first page:  During A.M. rounds  MD notified (2nd page):  Time of second page:  Responding MD:  Dr Laneta Simmers  Time MD responded:  During A.M. rounds

## 2013-08-31 NOTE — Progress Notes (Signed)
Subjective: Intubated, tachycardic Opens eyes  Objective: Vital signs in last 24 hours: Temp:  [97.9 F (36.6 C)-100.3 F (37.9 C)] 99.9 F (37.7 C) (10/19 0745) Pulse Rate:  [46-136] 126 (10/19 0730) Resp:  [14-26] 21 (10/19 0800) BP: (76-137)/(41-86) 94/65 mmHg (10/19 0800) SpO2:  [90 %-100 %] 99 % (10/19 0730) FiO2 (%):  [40 %-100 %] 40 % (10/19 0907) Weight:  [110 lb 7.2 oz (50.1 kg)] 110 lb 7.2 oz (50.1 kg) (10/19 0600) Last BM Date: 08-15-13  Intake/Output from previous day: 10/18 0701 - 10/19 0700 In: 1960 [I.V.:1840; NG/GT:120] Out: 1035 [Urine:835; Emesis/NG output:200] Intake/Output this shift: Total I/O In: -  Out: 35 [Urine:35]  Lungs with coarse BS bilaterally. Abdomen soft, NT  Lab Results:   Recent Labs  08/16/13 0400 08/17/13 1045  WBC 14.4* 11.1*  HGB 9.9* 9.3*  HCT 32.4* 29.8*  PLT 330 161   BMET  Recent Labs  08/17/13 1045  NA 134*  K 5.3*  CL 99  CO2 25  GLUCOSE 95  BUN 16  CREATININE 0.87  CALCIUM 9.1   PT/INR No results found for this basename: LABPROT, INR,  in the last 72 hours ABG  Recent Labs  08/17/13 1054  PHART 7.264*  HCO3 29.3*    Studies/Results: Portable Chest Xray In Am  08/17/2013   CLINICAL DATA:  Endotracheal tube placement  EXAM: PORTABLE CHEST - 1 VIEW  COMPARISON:  Prior chest x-ray 08/17/2013  FINDINGS: Endotracheal tube is in good position 3 cm above the carinal. The tip of the nasogastric tube is not visualized but lies off the field of view, presumably within the stomach. Cardiac and mediastinal contours are unchanged. Aortic atherosclerosis is noted. No definite right-sided pneumothorax identified on today's examination. No pleural effusion. Perihilar and biapical atelectasis versus scarring. Right-sided rib fractures.  IMPRESSION: 1. No pneumothorax identified on today's examination. 2. Endotracheal tube in good position 3 cm above the carinal.   Electronically Signed   By: Malachy Moan M.D.    On: 08/15/2013 07:40   Portable Chest Xray  08/17/2013   CLINICAL DATA:  Evaluate endotracheal tube placement.  EXAM: PORTABLE CHEST - 1 VIEW  COMPARISON:  08/17/2013  FINDINGS: Endotracheal tube is 4.3 cm above the carina. Nasogastric tube extends into the abdomen. The side hole of the nasogastric tube is at the GE junction. Again noted is lucency underneath the hemidiaphragms and difficult to exclude free air. Again noted are slightly prominent interstitial markings in the right upper lung but no focal airspace disease. No significant pneumothorax on this image.  IMPRESSION: Endotracheal tube and nasogastric tube placement as described.  Lucency underneath the hemidiaphragms could be related to bowel gas but difficult to exclude free air. Consider a left lateral decubitus image for further evaluation.   Electronically Signed   By: Richarda Overlie M.D.   On: 08/17/2013 10:10   Dg Chest Port 1 View  08/17/2013   CLINICAL DATA:  Rib fractures, pneumothorax  EXAM: PORTABLE CHEST - 1 VIEW  COMPARISON:  Prior chest x-ray 08/16/2013  FINDINGS: Trace residual right basal pneumothorax. The lungs are otherwise well expanded. No new airspace consolidation, pleural effusion or pulmonary edema. Right-sided rib fractures better demonstrated on recent CT. Colonic interposition again noted in the right upper quadrant. Stable cardiac and mediastinal contours. Atherosclerotic calcifications noted in the transverse aorta.  IMPRESSION: Trace curvilinear lucency in the right lung base favored to reflect small residual subpulmonic pneumothorax.  Otherwise, stable chest x-ray.   Electronically Signed  By: Malachy Moan M.D.   On: 08/17/2013 08:50   Dg Chest Port 1 View  08/16/2013   CLINICAL DATA:  Shortness of breath. Wheezing.  EXAM: PORTABLE CHEST - 1 VIEW  COMPARISON:  Chest radiograph 08/15/2013.  FINDINGS: Patient is rotated to the left. Stable cardiac and mediastinal contours. Elevation of the right hemidiaphragm.  Relative lucency under the right hemidiaphragm likely secondary to colonic gas. Unchanged upper lobe predominant interstitial thickening. No large consolidative pulmonary opacities, pleural effusion or pneumothorax. .  IMPRESSION: 1. No large pleural effusion or pneumothorax. 2. Lucency under the right hemidiaphragm likely secondary to colonic gas.   Electronically Signed   By: Annia Belt M.D.   On: 08/16/2013 10:27   Dg Abd Portable 2v  08/17/2013   CLINICAL DATA:  Abdominal pain.  EXAM: PORTABLE ABDOMEN - 2 VIEW  COMPARISON:  CT chest abdomen 08/13/2013.  FINDINGS: And mildly dilated small bowel and colon may suggest a diffuse ileus. There are scattered air-fluid levels on the decubitus film. No definite free air is identified.  IMPRESSION: Probable diffuse ileus. No definite free air.   Electronically Signed   By: Loralie Champagne M.D.   On: 08/17/2013 14:19    Anti-infectives: Anti-infectives   None      Assessment/Plan: s/p * No surgery found *  Continuing vent per CCM Will have to start tube feeds if need for vent persists  LOS: 7 days    Imogen Maddalena A 2013-08-28

## 2013-08-31 NOTE — Progress Notes (Signed)
I saw pt earlier in day and noted that pt was not ready for extubation due to poor cognition. He also had elevated WOB on SBT despite clear CXR. His neuro exam demonstrated minimal use of RUE. I noted an echocardiogram performed earlier in hospitalization which revealed pulmonary hypertension but this had been noted on prior echocardiograms. I ordered a CT head to r/o CVA and CTA chest to R/O PE. I started dexmedetomidine in hopes of allowing for sufficient wakefulness and calmness to allow extubation soo. He received no bolus of precedex and was started on a very low infusion rate. Shortly thereafter, he developed bradycardic arrest and ACLS was initiated. He underwent ACLS under my direction for approximately 20 mins without ROSC. Please see Code Blue log. With Dr Magnus Ivan @ the bedside, we agreed that further efforts at resuscitation would be futile and CPR was discontinued.    Billy Fischer, MD ; [facility] [phone].  After 5:30 PM or weekends, call [phone]

## 2013-08-31 NOTE — Progress Notes (Signed)
Pt became bradycardic following repositioning, code blue called, full code initiated at 12:11pm.  Pt did not regain pulse and remained in PEA despite our efforts.  See code blue sheet for details.  Pt without pulse or respirations, pupils fixed and dilated.  Death verified by MD and RN.

## 2013-08-31 NOTE — Progress Notes (Signed)
Patient ID: [name], male   DOB: 11-26-1929, 79 y.o.   MRN: [id number]  I have tried unsuccessfully to contact family.  Had to leave a message on a son's voice mail.
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
## 2013-08-31 NOTE — Progress Notes (Signed)
Wasted remaining 45ml of Precedex infusion into sink. Witnessed by Darnelle Going, RN and documented in [place].

## 2013-08-31 DEATH — deceased
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
